# Patient Record
Sex: Female | Born: 1970 | Race: White | Hispanic: No | Smoking: Former smoker
Health system: Southern US, Community
[De-identification: ages and names within clinical notes are randomized; demographics above are authoritative.]

## PROBLEM LIST (undated history)

## (undated) DIAGNOSIS — E7800 Pure hypercholesterolemia, unspecified: Secondary | ICD-10-CM

## (undated) DIAGNOSIS — I10 Essential (primary) hypertension: Secondary | ICD-10-CM

## (undated) DIAGNOSIS — Z9289 Personal history of other medical treatment: Secondary | ICD-10-CM

## (undated) DIAGNOSIS — Z8659 Personal history of other mental and behavioral disorders: Secondary | ICD-10-CM

## (undated) DIAGNOSIS — G709 Myoneural disorder, unspecified: Secondary | ICD-10-CM

## (undated) DIAGNOSIS — I252 Old myocardial infarction: Secondary | ICD-10-CM

## (undated) DIAGNOSIS — L723 Sebaceous cyst: Secondary | ICD-10-CM

## (undated) DIAGNOSIS — I5032 Chronic diastolic (congestive) heart failure: Secondary | ICD-10-CM

## (undated) DIAGNOSIS — G43909 Migraine, unspecified, not intractable, without status migrainosus: Secondary | ICD-10-CM

## (undated) DIAGNOSIS — A4902 Methicillin resistant Staphylococcus aureus infection, unspecified site: Secondary | ICD-10-CM

## (undated) DIAGNOSIS — I2510 Atherosclerotic heart disease of native coronary artery without angina pectoris: Secondary | ICD-10-CM

## (undated) DIAGNOSIS — Z9889 Other specified postprocedural states: Secondary | ICD-10-CM

## (undated) DIAGNOSIS — I341 Nonrheumatic mitral (valve) prolapse: Secondary | ICD-10-CM

## (undated) DIAGNOSIS — G629 Polyneuropathy, unspecified: Secondary | ICD-10-CM

## (undated) HISTORY — DX: Essential (primary) hypertension: I10

## (undated) HISTORY — PX: SKIN GRAFT: SHX250

## (undated) HISTORY — DX: Nonrheumatic mitral (valve) prolapse: I34.1

## (undated) HISTORY — DX: Personal history of other medical treatment: Z92.89

## (undated) HISTORY — DX: Personal history of other mental and behavioral disorders: Z86.59

## (undated) HISTORY — DX: Migraine, unspecified, not intractable, without status migrainosus: G43.909

## (undated) HISTORY — DX: Chronic diastolic (congestive) heart failure: I50.32

## (undated) HISTORY — PX: ABDOMINAL HYSTERECTOMY: SHX81

## (undated) HISTORY — DX: Myoneural disorder, unspecified: G70.9

## (undated) HISTORY — PX: CORONARY ARTERY BYPASS GRAFT: SHX141

## (undated) HISTORY — DX: Sebaceous cyst: L72.3

## (undated) HISTORY — DX: Polyneuropathy, unspecified: G62.9

## (undated) HISTORY — DX: Other specified postprocedural states: Z98.890

---

## 1999-08-20 ENCOUNTER — Encounter: Payer: Self-pay | Admitting: *Deleted

## 1999-08-20 ENCOUNTER — Emergency Department (HOSPITAL_COMMUNITY): Admission: EM | Admit: 1999-08-20 | Discharge: 1999-08-20 | Payer: Self-pay | Admitting: *Deleted

## 1999-11-22 ENCOUNTER — Other Ambulatory Visit: Admission: RE | Admit: 1999-11-22 | Discharge: 1999-11-22 | Payer: Self-pay | Admitting: Obstetrics and Gynecology

## 2000-06-04 ENCOUNTER — Inpatient Hospital Stay (HOSPITAL_COMMUNITY): Admission: AD | Admit: 2000-06-04 | Discharge: 2000-06-04 | Payer: Self-pay | Admitting: Obstetrics and Gynecology

## 2000-07-05 ENCOUNTER — Inpatient Hospital Stay (HOSPITAL_COMMUNITY): Admission: AD | Admit: 2000-07-05 | Discharge: 2000-07-07 | Payer: Self-pay | Admitting: Obstetrics and Gynecology

## 2002-05-23 ENCOUNTER — Other Ambulatory Visit: Admission: RE | Admit: 2002-05-23 | Discharge: 2002-05-23 | Payer: Self-pay | Admitting: Family Medicine

## 2004-06-03 ENCOUNTER — Ambulatory Visit: Payer: Self-pay | Admitting: Family Medicine

## 2004-07-11 ENCOUNTER — Ambulatory Visit: Payer: Self-pay | Admitting: Family Medicine

## 2004-07-28 ENCOUNTER — Ambulatory Visit: Payer: Self-pay | Admitting: Family Medicine

## 2004-08-01 HISTORY — PX: ABDOMINAL HYSTERECTOMY: SHX81

## 2004-08-02 ENCOUNTER — Ambulatory Visit: Payer: Self-pay | Admitting: Family Medicine

## 2004-08-04 ENCOUNTER — Other Ambulatory Visit: Admission: RE | Admit: 2004-08-04 | Discharge: 2004-08-04 | Payer: Self-pay | Admitting: Obstetrics and Gynecology

## 2005-05-08 ENCOUNTER — Ambulatory Visit: Payer: Self-pay | Admitting: Family Medicine

## 2006-02-12 ENCOUNTER — Ambulatory Visit: Payer: Self-pay | Admitting: Family Medicine

## 2006-02-26 ENCOUNTER — Ambulatory Visit: Payer: Self-pay | Admitting: Family Medicine

## 2008-04-10 ENCOUNTER — Other Ambulatory Visit: Admission: RE | Admit: 2008-04-10 | Discharge: 2008-04-10 | Payer: Self-pay | Admitting: Internal Medicine

## 2008-04-10 ENCOUNTER — Encounter (INDEPENDENT_AMBULATORY_CARE_PROVIDER_SITE_OTHER): Payer: Self-pay | Admitting: Nurse Practitioner

## 2008-04-10 ENCOUNTER — Ambulatory Visit: Payer: Self-pay | Admitting: Nurse Practitioner

## 2008-04-10 DIAGNOSIS — N6019 Diffuse cystic mastopathy of unspecified breast: Secondary | ICD-10-CM | POA: Insufficient documentation

## 2008-04-10 DIAGNOSIS — E118 Type 2 diabetes mellitus with unspecified complications: Secondary | ICD-10-CM | POA: Insufficient documentation

## 2008-04-10 DIAGNOSIS — F17200 Nicotine dependence, unspecified, uncomplicated: Secondary | ICD-10-CM | POA: Insufficient documentation

## 2008-04-10 LAB — CONVERTED CEMR LAB

## 2008-04-13 ENCOUNTER — Encounter (INDEPENDENT_AMBULATORY_CARE_PROVIDER_SITE_OTHER): Payer: Self-pay | Admitting: Nurse Practitioner

## 2008-04-13 ENCOUNTER — Telehealth (INDEPENDENT_AMBULATORY_CARE_PROVIDER_SITE_OTHER): Payer: Self-pay | Admitting: Nurse Practitioner

## 2008-04-16 ENCOUNTER — Telehealth (INDEPENDENT_AMBULATORY_CARE_PROVIDER_SITE_OTHER): Payer: Self-pay | Admitting: Nurse Practitioner

## 2008-04-16 ENCOUNTER — Ambulatory Visit (HOSPITAL_COMMUNITY): Admission: RE | Admit: 2008-04-16 | Discharge: 2008-04-16 | Payer: Self-pay | Admitting: Internal Medicine

## 2008-04-21 ENCOUNTER — Encounter (INDEPENDENT_AMBULATORY_CARE_PROVIDER_SITE_OTHER): Payer: Self-pay | Admitting: Nurse Practitioner

## 2008-04-21 ENCOUNTER — Ambulatory Visit: Payer: Self-pay | Admitting: Internal Medicine

## 2008-05-20 ENCOUNTER — Ambulatory Visit: Payer: Self-pay | Admitting: Nurse Practitioner

## 2008-05-20 LAB — CONVERTED CEMR LAB
Bilirubin Urine: NEGATIVE
Blood Glucose, Fingerstick: 204
Blood in Urine, dipstick: NEGATIVE
Cholesterol, target level: 200 mg/dL
Glucose, Urine, Semiquant: 100
HDL goal, serum: 40 mg/dL
Ketones, urine, test strip: NEGATIVE
LDL Goal: 100 mg/dL
Nitrite: NEGATIVE
Protein, U semiquant: NEGATIVE
Rubella: 27.6 intl units/mL — ABNORMAL HIGH
Rubeola IgG: 1.35 — ABNORMAL HIGH
Specific Gravity, Urine: 1.015
Urobilinogen, UA: 0.2
Varicella IgG: 5.29 — ABNORMAL HIGH
WBC Urine, dipstick: NEGATIVE
pH: 5.5

## 2008-05-22 ENCOUNTER — Ambulatory Visit: Payer: Self-pay | Admitting: Nurse Practitioner

## 2008-10-09 ENCOUNTER — Ambulatory Visit: Payer: Self-pay | Admitting: Nurse Practitioner

## 2008-10-09 DIAGNOSIS — L723 Sebaceous cyst: Secondary | ICD-10-CM | POA: Insufficient documentation

## 2009-03-31 ENCOUNTER — Ambulatory Visit: Payer: Self-pay | Admitting: Cardiovascular Disease

## 2009-04-01 ENCOUNTER — Ambulatory Visit: Payer: Self-pay | Admitting: Thoracic Surgery (Cardiothoracic Vascular Surgery)

## 2009-04-01 ENCOUNTER — Encounter: Payer: Self-pay | Admitting: Family Medicine

## 2009-04-01 ENCOUNTER — Ambulatory Visit: Payer: Self-pay | Admitting: Cardiovascular Disease

## 2009-04-01 ENCOUNTER — Encounter: Payer: Self-pay | Admitting: Emergency Medicine

## 2009-04-01 ENCOUNTER — Inpatient Hospital Stay (HOSPITAL_COMMUNITY): Admission: EM | Admit: 2009-04-01 | Discharge: 2009-04-15 | Payer: Self-pay | Admitting: Cardiology

## 2009-04-01 ENCOUNTER — Ambulatory Visit: Payer: Self-pay | Admitting: Internal Medicine

## 2009-04-02 ENCOUNTER — Encounter: Payer: Self-pay | Admitting: Cardiology

## 2009-04-03 ENCOUNTER — Encounter: Payer: Self-pay | Admitting: Cardiology

## 2009-04-03 ENCOUNTER — Ambulatory Visit: Payer: Self-pay | Admitting: Surgery

## 2009-04-06 ENCOUNTER — Ambulatory Visit: Payer: Self-pay | Admitting: Thoracic Surgery (Cardiothoracic Vascular Surgery)

## 2009-04-07 ENCOUNTER — Encounter: Payer: Self-pay | Admitting: Thoracic Surgery (Cardiothoracic Vascular Surgery)

## 2009-04-08 ENCOUNTER — Ambulatory Visit: Payer: Self-pay | Admitting: Pulmonary Disease

## 2009-04-14 ENCOUNTER — Telehealth: Payer: Self-pay | Admitting: Family Medicine

## 2009-04-19 ENCOUNTER — Encounter: Payer: Self-pay | Admitting: Cardiology

## 2009-04-19 ENCOUNTER — Ambulatory Visit: Payer: Self-pay | Admitting: Cardiology

## 2009-04-19 LAB — CONVERTED CEMR LAB: POC INR: 1.8

## 2009-04-26 ENCOUNTER — Ambulatory Visit: Payer: Self-pay | Admitting: Family Medicine

## 2009-04-26 ENCOUNTER — Ambulatory Visit: Payer: Self-pay | Admitting: Cardiology

## 2009-04-26 DIAGNOSIS — I10 Essential (primary) hypertension: Secondary | ICD-10-CM | POA: Insufficient documentation

## 2009-04-26 DIAGNOSIS — I2510 Atherosclerotic heart disease of native coronary artery without angina pectoris: Secondary | ICD-10-CM | POA: Insufficient documentation

## 2009-04-26 DIAGNOSIS — E785 Hyperlipidemia, unspecified: Secondary | ICD-10-CM | POA: Insufficient documentation

## 2009-04-28 DIAGNOSIS — I080 Rheumatic disorders of both mitral and aortic valves: Secondary | ICD-10-CM | POA: Insufficient documentation

## 2009-04-30 LAB — CONVERTED CEMR LAB
BUN: 9 mg/dL (ref 6–23)
Basophils Absolute: 0.1 10*3/uL (ref 0.0–0.1)
Basophils Relative: 1.2 % (ref 0.0–3.0)
CO2: 27 meq/L (ref 19–32)
Calcium: 9.2 mg/dL (ref 8.4–10.5)
Chloride: 99 meq/L (ref 96–112)
Creatinine, Ser: 0.7 mg/dL (ref 0.4–1.2)
Creatinine,U: 64.6 mg/dL
Eosinophils Absolute: 0.2 10*3/uL (ref 0.0–0.7)
Eosinophils Relative: 2.7 % (ref 0.0–5.0)
GFR calc non Af Amer: 98.34 mL/min (ref >60)
Glucose, Bld: 104 mg/dL — ABNORMAL HIGH (ref 70–99)
HCT: 34.1 % — ABNORMAL LOW (ref 36.0–46.0)
Hemoglobin: 11.5 g/dL — ABNORMAL LOW (ref 12.0–15.0)
Hgb A1c MFr Bld: 8 % — ABNORMAL HIGH (ref 4.6–6.5)
Lymphocytes Relative: 28.3 % (ref 12.0–46.0)
Lymphs Abs: 2.5 10*3/uL (ref 0.7–4.0)
MCHC: 33.6 g/dL (ref 30.0–36.0)
MCV: 91.1 fL (ref 78.0–100.0)
Microalb Creat Ratio: 10.8 mg/g (ref 0.0–30.0)
Microalb, Ur: 0.7 mg/dL (ref 0.0–1.9)
Monocytes Absolute: 0.6 10*3/uL (ref 0.1–1.0)
Monocytes Relative: 6.4 % (ref 3.0–12.0)
Neutro Abs: 5.3 10*3/uL (ref 1.4–7.7)
Neutrophils Relative %: 61.4 % (ref 43.0–77.0)
Platelets: 452 10*3/uL — ABNORMAL HIGH (ref 150.0–400.0)
Potassium: 3.7 meq/L (ref 3.5–5.1)
RBC: 3.74 M/uL — ABNORMAL LOW (ref 3.87–5.11)
RDW: 12.9 % (ref 11.5–14.6)
Sodium: 140 meq/L (ref 135–145)
WBC: 8.7 10*3/uL (ref 4.5–10.5)

## 2009-05-03 ENCOUNTER — Ambulatory Visit: Payer: Self-pay | Admitting: Thoracic Surgery (Cardiothoracic Vascular Surgery)

## 2009-05-03 ENCOUNTER — Encounter: Payer: Self-pay | Admitting: Cardiology

## 2009-05-03 ENCOUNTER — Encounter
Admission: RE | Admit: 2009-05-03 | Discharge: 2009-05-03 | Payer: Self-pay | Admitting: Thoracic Surgery (Cardiothoracic Vascular Surgery)

## 2009-05-03 ENCOUNTER — Encounter: Payer: Self-pay | Admitting: Family Medicine

## 2009-05-04 ENCOUNTER — Ambulatory Visit: Payer: Self-pay | Admitting: Thoracic Surgery (Cardiothoracic Vascular Surgery)

## 2009-05-04 ENCOUNTER — Ambulatory Visit: Payer: Self-pay | Admitting: Internal Medicine

## 2009-05-04 LAB — CONVERTED CEMR LAB: POC INR: 1.6

## 2009-05-10 ENCOUNTER — Encounter: Payer: Self-pay | Admitting: Family Medicine

## 2009-05-10 ENCOUNTER — Ambulatory Visit: Payer: Self-pay | Admitting: Thoracic Surgery (Cardiothoracic Vascular Surgery)

## 2009-05-11 ENCOUNTER — Ambulatory Visit: Payer: Self-pay | Admitting: Cardiology

## 2009-05-11 DIAGNOSIS — I25810 Atherosclerosis of coronary artery bypass graft(s) without angina pectoris: Secondary | ICD-10-CM | POA: Insufficient documentation

## 2009-05-11 LAB — CONVERTED CEMR LAB: POC INR: 1.7

## 2009-05-17 ENCOUNTER — Ambulatory Visit: Payer: Self-pay | Admitting: Thoracic Surgery (Cardiothoracic Vascular Surgery)

## 2009-05-17 ENCOUNTER — Encounter: Payer: Self-pay | Admitting: Cardiology

## 2009-05-21 ENCOUNTER — Encounter (INDEPENDENT_AMBULATORY_CARE_PROVIDER_SITE_OTHER): Payer: Self-pay | Admitting: *Deleted

## 2009-05-24 ENCOUNTER — Ambulatory Visit: Payer: Self-pay | Admitting: Thoracic Surgery (Cardiothoracic Vascular Surgery)

## 2009-05-26 ENCOUNTER — Ambulatory Visit (HOSPITAL_COMMUNITY)
Admission: RE | Admit: 2009-05-26 | Discharge: 2009-05-26 | Payer: Self-pay | Admitting: Thoracic Surgery (Cardiothoracic Vascular Surgery)

## 2009-05-27 ENCOUNTER — Ambulatory Visit: Payer: Self-pay | Admitting: Family Medicine

## 2009-05-31 ENCOUNTER — Ambulatory Visit: Payer: Self-pay | Admitting: Thoracic Surgery (Cardiothoracic Vascular Surgery)

## 2009-05-31 ENCOUNTER — Ambulatory Visit (HOSPITAL_COMMUNITY)
Admission: RE | Admit: 2009-05-31 | Discharge: 2009-05-31 | Payer: Self-pay | Admitting: Thoracic Surgery (Cardiothoracic Vascular Surgery)

## 2009-06-04 ENCOUNTER — Encounter (INDEPENDENT_AMBULATORY_CARE_PROVIDER_SITE_OTHER): Payer: Self-pay | Admitting: *Deleted

## 2009-06-08 ENCOUNTER — Ambulatory Visit: Payer: Self-pay | Admitting: Thoracic Surgery (Cardiothoracic Vascular Surgery)

## 2009-06-14 ENCOUNTER — Encounter: Payer: Self-pay | Admitting: Cardiology

## 2009-06-21 ENCOUNTER — Ambulatory Visit: Payer: Self-pay | Admitting: Thoracic Surgery (Cardiothoracic Vascular Surgery)

## 2009-06-24 ENCOUNTER — Telehealth: Payer: Self-pay | Admitting: Family Medicine

## 2009-06-28 ENCOUNTER — Ambulatory Visit: Payer: Self-pay | Admitting: Thoracic Surgery (Cardiothoracic Vascular Surgery)

## 2009-07-05 ENCOUNTER — Ambulatory Visit: Payer: Self-pay | Admitting: Thoracic Surgery (Cardiothoracic Vascular Surgery)

## 2009-07-05 ENCOUNTER — Ambulatory Visit (HOSPITAL_COMMUNITY)
Admission: RE | Admit: 2009-07-05 | Discharge: 2009-07-05 | Payer: Self-pay | Admitting: Thoracic Surgery (Cardiothoracic Vascular Surgery)

## 2009-07-12 ENCOUNTER — Ambulatory Visit: Payer: Self-pay | Admitting: Thoracic Surgery (Cardiothoracic Vascular Surgery)

## 2009-07-14 ENCOUNTER — Ambulatory Visit: Payer: Self-pay | Admitting: Thoracic Surgery (Cardiothoracic Vascular Surgery)

## 2009-07-14 ENCOUNTER — Inpatient Hospital Stay (HOSPITAL_COMMUNITY)
Admission: AD | Admit: 2009-07-14 | Discharge: 2009-08-06 | Payer: Self-pay | Admitting: Thoracic Surgery (Cardiothoracic Vascular Surgery)

## 2009-07-15 ENCOUNTER — Ambulatory Visit: Payer: Self-pay | Admitting: Thoracic Surgery (Cardiothoracic Vascular Surgery)

## 2009-08-02 ENCOUNTER — Encounter: Payer: Self-pay | Admitting: Cardiology

## 2009-08-19 ENCOUNTER — Encounter: Payer: Self-pay | Admitting: Cardiology

## 2009-08-23 ENCOUNTER — Ambulatory Visit: Payer: Self-pay | Admitting: Thoracic Surgery (Cardiothoracic Vascular Surgery)

## 2009-09-02 ENCOUNTER — Ambulatory Visit: Payer: Self-pay | Admitting: Nurse Practitioner

## 2009-09-02 DIAGNOSIS — G609 Hereditary and idiopathic neuropathy, unspecified: Secondary | ICD-10-CM | POA: Insufficient documentation

## 2009-09-02 LAB — CONVERTED CEMR LAB
Bilirubin Urine: NEGATIVE
Blood Glucose, Fingerstick: 148
Blood in Urine, dipstick: NEGATIVE
Glucose, Urine, Semiquant: NEGATIVE
Hgb A1c MFr Bld: 6.5 %
Ketones, urine, test strip: NEGATIVE
Nitrite: NEGATIVE
Specific Gravity, Urine: >=1.03
Urobilinogen, UA: 0.2
WBC Urine, dipstick: NEGATIVE
pH: 5

## 2009-09-03 LAB — CONVERTED CEMR LAB: Microalb, Ur: 2.92 mg/dL — ABNORMAL HIGH (ref 0.00–1.89)

## 2009-09-07 ENCOUNTER — Encounter (INDEPENDENT_AMBULATORY_CARE_PROVIDER_SITE_OTHER): Payer: Self-pay | Admitting: Nurse Practitioner

## 2009-09-09 ENCOUNTER — Telehealth (INDEPENDENT_AMBULATORY_CARE_PROVIDER_SITE_OTHER): Payer: Self-pay | Admitting: Nurse Practitioner

## 2009-09-09 ENCOUNTER — Encounter (INDEPENDENT_AMBULATORY_CARE_PROVIDER_SITE_OTHER): Payer: Self-pay | Admitting: Nurse Practitioner

## 2009-09-10 ENCOUNTER — Encounter: Payer: Self-pay | Admitting: Cardiology

## 2009-09-10 ENCOUNTER — Ambulatory Visit: Payer: Self-pay | Admitting: Thoracic Surgery (Cardiothoracic Vascular Surgery)

## 2009-10-05 ENCOUNTER — Encounter (INDEPENDENT_AMBULATORY_CARE_PROVIDER_SITE_OTHER): Payer: Self-pay | Admitting: Nurse Practitioner

## 2009-10-15 ENCOUNTER — Ambulatory Visit: Payer: Self-pay | Admitting: Cardiology

## 2009-10-15 DIAGNOSIS — Z87891 Personal history of nicotine dependence: Secondary | ICD-10-CM | POA: Insufficient documentation

## 2009-10-19 ENCOUNTER — Encounter (INDEPENDENT_AMBULATORY_CARE_PROVIDER_SITE_OTHER): Payer: Self-pay | Admitting: Nurse Practitioner

## 2009-10-21 ENCOUNTER — Emergency Department (HOSPITAL_COMMUNITY): Admission: EM | Admit: 2009-10-21 | Discharge: 2009-10-21 | Payer: Self-pay | Admitting: Family Medicine

## 2010-02-12 ENCOUNTER — Emergency Department (HOSPITAL_BASED_OUTPATIENT_CLINIC_OR_DEPARTMENT_OTHER): Admission: EM | Admit: 2010-02-12 | Discharge: 2010-02-12 | Payer: Self-pay | Admitting: Emergency Medicine

## 2010-02-18 ENCOUNTER — Telehealth (INDEPENDENT_AMBULATORY_CARE_PROVIDER_SITE_OTHER): Payer: Self-pay | Admitting: Nurse Practitioner

## 2010-03-14 ENCOUNTER — Ambulatory Visit: Payer: Self-pay | Admitting: Thoracic Surgery (Cardiothoracic Vascular Surgery)

## 2010-03-14 ENCOUNTER — Encounter: Payer: Self-pay | Admitting: Cardiology

## 2010-04-24 ENCOUNTER — Encounter: Payer: Self-pay | Admitting: Thoracic Surgery (Cardiothoracic Vascular Surgery)

## 2010-05-01 LAB — CONVERTED CEMR LAB
ALT: 20 units/L (ref 0–35)
AST: 14 units/L (ref 0–37)
Albumin: 4.3 g/dL (ref 3.5–5.2)
Alkaline Phosphatase: 97 units/L (ref 39–117)
BUN: 13 mg/dL (ref 6–23)
Basophils Absolute: 0.1 10*3/uL (ref 0.0–0.1)
Basophils Relative: 0 % (ref 0–1)
Bilirubin Urine: NEGATIVE
Blood Glucose, Fingerstick: 226
Blood in Urine, dipstick: NEGATIVE
CO2: 24 meq/L (ref 19–32)
Calcium: 9.5 mg/dL (ref 8.4–10.5)
Chlamydia, DNA Probe: NEGATIVE
Chloride: 99 meq/L (ref 96–112)
Cholesterol: 258 mg/dL — ABNORMAL HIGH (ref 0–200)
Creatinine, Ser: 0.83 mg/dL (ref 0.40–1.20)
Eosinophils Absolute: 0.1 10*3/uL (ref 0.0–0.7)
Eosinophils Relative: 1 % (ref 0–5)
GC Probe Amp, Genital: NEGATIVE
Glucose, Bld: 251 mg/dL — ABNORMAL HIGH (ref 70–99)
Glucose, Urine, Semiquant: >=1000
HCT: 45.7 % (ref 36.0–46.0)
HDL: 31 mg/dL — ABNORMAL LOW (ref >39)
Hemoglobin: 15.6 g/dL — ABNORMAL HIGH (ref 12.0–15.0)
Hgb A1c MFr Bld: 10.5 %
LDL Cholesterol: 172 mg/dL — ABNORMAL HIGH (ref 0–99)
Lymphocytes Relative: 25 % (ref 12–46)
Lymphs Abs: 3.3 10*3/uL (ref 0.7–4.0)
MCHC: 34.1 g/dL (ref 30.0–36.0)
MCV: 91.8 fL (ref 78.0–100.0)
Microalb, Ur: 4.02 mg/dL — ABNORMAL HIGH (ref 0.00–1.89)
Monocytes Absolute: 0.6 10*3/uL (ref 0.1–1.0)
Monocytes Relative: 4 % (ref 3–12)
Neutro Abs: 9.5 10*3/uL — ABNORMAL HIGH (ref 1.7–7.7)
Neutrophils Relative %: 70 % (ref 43–77)
Nitrite: NEGATIVE
Platelets: 297 10*3/uL (ref 150–400)
Potassium: 4.8 meq/L (ref 3.5–5.3)
Protein, U semiquant: 30
RBC: 4.98 M/uL (ref 3.87–5.11)
RDW: 12.9 % (ref 11.5–15.5)
Sodium: 137 meq/L (ref 135–145)
Specific Gravity, Urine: >=1.03
TSH: 1.109 microintl units/mL (ref 0.350–4.50)
Total Bilirubin: 0.7 mg/dL (ref 0.3–1.2)
Total CHOL/HDL Ratio: 8.3
Total Protein: 7.8 g/dL (ref 6.0–8.3)
Triglycerides: 273 mg/dL — ABNORMAL HIGH (ref <150)
Urobilinogen, UA: 0.2
VLDL: 55 mg/dL — ABNORMAL HIGH (ref 0–40)
WBC Urine, dipstick: NEGATIVE
WBC: 13.5 10*3/uL — ABNORMAL HIGH (ref 4.0–10.5)
pH: 5.5

## 2010-05-03 NOTE — Letter (Signed)
Summary: [facility] NOTES  /OPHTHALMOLOGIST NOTES   Imported By: Arta Bruce 10/20/2009 14:51:01  _____________________________________________________________________  External Attachment:    Type:   Image     Comment:   External Document

## 2010-05-03 NOTE — Miscellaneous (Signed)
Summary: Prior approval  Clinical Lists Changes Prior approval for one year 09/07/09 - 09/07/10  Medications: Changed medication from CRESTOR 40 MG TABS (ROSUVASTATIN CALCIUM) One tablet by mouth nightly to CRESTOR 40 MG TABS (ROSUVASTATIN CALCIUM) One tablet by mouth nightly

## 2010-05-03 NOTE — Letter (Signed)
Summary: Appointment - Missed  [facility], Main Office  [address]   [place], [place] [address]   Phone: [phone]  Fax: [phone]     June 04, 2009 MRN: [id number]   [facility] [address] [place], [place]  [address]   Dear [name],  Our records indicate you missed your appointment on 05/31/2009 with Dr. Riley Kill. It is very important that we reach you to reschedule this appointment. We look forward to participating in your health care needs. Please contact us at the number listed above at your earliest convenience to reschedule this appointment.     Sincerely, LG Glass blower/designer

## 2010-05-03 NOTE — Letter (Signed)
Summary: [facility]  [facility]   Imported By: Maryln Gottron 05/25/2009 12:18:37  _____________________________________________________________________  External Attachment:    Type:   Image     Comment:   External Document

## 2010-05-03 NOTE — Medication Information (Signed)
Summary: ROV/LB  Anticoagulant Therapy  Managed by: Shelby Dubin, PharmD, BCPS, CPP Referring MD: Shawnie Pons, MD PCP: Dr. Shellia Carwin Supervising MD: Antoine Poche MD, Fayrene Fearing Indication 1: Mitral Valve Regurgitation INR RANGE 2-3  Dietary changes: no    Health status changes: no    Bleeding/hemorrhagic complications: no    Recent/future hospitalizations: no    Any changes in medication regimen? no    Recent/future dental: no  Any missed doses?: no       Is patient compliant with meds? yes       Allergies (verified): 1)  ! Metformin Hcl  Anticoagulation Management History:      The patient is taking warfarin and comes in today for a routine follow up visit.  Positive risk factors for bleeding include presence of serious comorbidities.  Negative risk factors for bleeding include an age less than 25 years old and no history of CVA/TIA.  The bleeding index is 'intermediate risk'.  Positive CHADS2 values include History of HTN and History of Diabetes.  Negative CHADS2 values include Age > 63 years old and Prior Stroke/CVA/TIA.  Anticoagulation responsible provider: Antoine Poche MD, Fayrene Fearing.  Cuvette Lot#: [id number].  Exp: 07/2010.    Anticoagulation Management Assessment/Plan:      The patient's current anticoagulation dose is Warfarin sodium 2 mg tabs: Use as directed by Anticoagualtion Clinic.  The target INR is 2.0-3.0.  The next INR is due 05/04/2009.  Results were reviewed/authorized by Shelby Dubin, PharmD, BCPS, CPP.  She was notified by Ysidro Evert, Pharm D Candidate.         Prior Anticoagulation Instructions: INR 1.8  Take 1.5 tablets today then take 1 tablet daily except 1.5 tablets on Monday. Recheck in 1 week.  Current Anticoagulation Instructions: INR: 1.7 Take 2 tablets today, then increase dose to 1 tablet daily except 1.5 tablets on Mondays and Thursdays Recheck in 1 weeks

## 2010-05-03 NOTE — Progress Notes (Signed)
Summary: re-est pt  Phone Note Call from Patient Call back at [phone]   Caller: Other Relative-sister [facility] Call For: [facility] Summary of Call: Pt had open heart she will be release from hos 04-15-2009. pt has seen dr Shelley Cocke in  2007 due to medicaid pt found new md, pt now has [facility] ins per sister  dr Riley Kill is requesting pt see dr Kindall Swaby when in 3 wk, Can  I re-est her and create 30 min ov. Initial call taken by: Heron Sabins,  April 14, 2009 3:47 PM  Follow-up for Phone Call        yes I would be glad to see her again Follow-up by: Nelwyn Salisbury MD,  April 15, 2009 9:01 AM  Additional Follow-up for Phone Call Additional follow up Details #1::        ov sch 04-26-2009 1pm Additional Follow-up by: Heron Sabins,  April 15, 2009 9:42 AM

## 2010-05-03 NOTE — Assessment & Plan Note (Signed)
Summary: HTN   Vital Signs:  Patient profile:   40 year old female Weight:      202.5 pounds Temp:     98.1 degrees F oral Pulse rate:   84 / minute Pulse rhythm:   regular Resp:     20 per minute BP sitting:   140 / 88  (right arm) Cuff size:   large  Vitals Entered By: Mikey College CMA (October 09, 2008 2:22 PM) CC: pt states she has been having some recent chest pain and last episode was yesterday. they only last for 2-3 minutes then stop. pt also states having a possible boil or pimple that is irritated on abd area. It's now red and her clothes are irritating it. Also has small pimple on lt back side of thigh that she would like provider to look at., Hypertension Management Pain Assessment Patient in pain? no       Does patient need assistance? Functional Status Self care Ambulation Normal   CC:  pt states she has been having some recent chest pain and last episode was yesterday. they only last for 2-3 minutes then stop. pt also states having a possible boil or pimple that is irritated on abd area. It's now red and her clothes are irritating it. Also has small pimple on lt back side of thigh that she would like provider to look at. and Hypertension Management.  History of Present Illness:  Pt into the office with c/o chest pain started about 2-3 weeks. describes as midsternal pain when it does occur she breaks out in a sweat then she has tingling in her arms.  last for a few minutes then resolves Always with activity.  She will stop what she is doing and then sit down No daily occurence Recent passing of grandmother and other stressors  Diabetes - without her medications for about 3 months    Hypertension History:      She denies headache, chest pain, and palpitations.  no current medications.        Positive major cardiovascular risk factors include diabetes, hyperlipidemia, hypertension, family history for ischemic heart disease (females less than 56 years old & males  less than 74 years old), and current tobacco user.  Negative major cardiovascular risk factors include female age less than 58 years old.        Further assessment for target organ damage reveals no history of ASHD, cardiac end-organ damage (CHF/LVH), stroke/TIA, peripheral vascular disease, renal insufficiency, or hypertensive retinopathy.     Habits & Providers  Alcohol-Tobacco-Diet     Alcohol drinks/day: 0     Tobacco Status: current     Tobacco Counseling: to quit use of tobacco products     Cigarette Packs/Day: 1     Year Started: 1987  Exercise-Depression-Behavior     Does Patient Exercise: no     Drug Use: no     Seat Belt Use: 100     Sun Exposure: occasionally  Allergies: No Known Drug Allergies  Review of Systems CV:  Complains of chest pain or discomfort. Resp:  Denies cough. GI:  Denies abdominal pain, nausea, and vomiting. Derm:  Complains of itching.  Physical Exam  General:  alert.   Head:  normocephalic.   Msk:  left abd 1.1cm x 1.1cm cystic lesion nontender but some irritation Skin:  left posterior thigh - .4mm x .4mm Psych:  Oriented X3.     Impression & Recommendations:  Problem # 1:  HYPERTENSION, BENIGN  ESSENTIAL (ICD-401.1) will start bp meds advised pt to check at home and report values Her updated medication list for this problem includes:    Lisinopril 5 Mg Tabs (Lisinopril) ..... One tablet by mouth for blood pressure  Problem # 2:  DIABETES MELLITUS, TYPE II, UNCONTROLLED (ICD-250.02) will check on next week Her updated medication list for this problem includes:    Metformin Hcl 500 Mg Tabs (Metformin hcl) .Marland Kitchen... 1 tablet by mouth two times a day for blood sugar    Lisinopril 5 Mg Tabs (Lisinopril) ..... One tablet by mouth for blood pressure  Problem # 3:  TOBACCO ABUSE (ICD-305.1) advised cessation  Problem # 4:  SEBACEOUS CYST (ICD-706.2) will monitor  Complete Medication List: 1)  Metformin Hcl 500 Mg Tabs (Metformin hcl) .Marland Kitchen..  1 tablet by mouth two times a day for blood sugar 2)  Prodigy Blood Glucose Monitor W/device Kit (Blood glucose monitoring suppl) .... Dispense one meter  dx 250.02 3)  Prodigy Blood Glucose Test Strp (Glucose blood) .... To check blood sugar twice daily  dx 250.02 4)  Prodigy Lancing Device Misc (Lancet devices) .... Check blood sugar twice daily dx:250.02 5)  Pravachol 20 Mg Tabs (Pravastatin sodium) .Marland Kitchen.. 1 tablet by mouth nightly for cholesterol 6)  Lisinopril 5 Mg Tabs (Lisinopril) .... One tablet by mouth for blood pressure  Hypertension Assessment/Plan:      The patient's hypertensive risk group is category C: Target organ damage and/or diabetes.  Her calculated 10 year risk of coronary heart disease is 15 %.  Today's blood pressure is 140/88.  Her blood pressure goal is < 130/80.  Patient Instructions: 1)  You need to monitor the areas on your belly and thigh. 2)  start blood pressure medication 3)  keep track of blood pressure 4)  Follow up in 2 weeks for blood pressure check. 5)  Bring log back with you. 6)  Will also need Hgba1c and glucoscan. Prescriptions: LISINOPRIL 5 MG TABS (LISINOPRIL) One tablet by mouth for blood pressure  #30 x 3   Entered and Authorized by:   Lehman Prom FNP   Signed by:   Lehman Prom FNP on 10/09/2008   Method used:   Print then Give to Patient   RxID:   [id number] LISINOPRIL 5 MG TABS (LISINOPRIL) One tablet by mouth for blood pressure  #30 x 3   Entered and Authorized by:   Lehman Prom FNP   Signed by:   Lehman Prom FNP on 10/09/2008   Method used:   Print then Give to Patient   RxID:   [id number]

## 2010-05-03 NOTE — Letter (Signed)
Summary: [facility]   [facility]   Imported By: Roderic Ovens 06/10/2009 15:05:02  _____________________________________________________________________  External Attachment:    Type:   Image     Comment:   External Document  Appended Document: [facility]  reviewed.  TS

## 2010-05-03 NOTE — Assessment & Plan Note (Signed)
Summary: Diabetes/Hypercholesterolemia   Vital Signs:  Patient Profile:   40 Years Old Female Height:     64 inches Weight:      202 pounds BMI:     34.80 BSA:     1.97 Temp:     97.3 degrees F oral Pulse rate:   80 / minute Pulse rhythm:   regular Resp:     18 per minute BP sitting:   128 / 80  (left arm) Cuff size:   regular  Pt. in pain?   no  Vitals Entered By: [place] Shannon (May 20, 2008 11:57 AM)              Is Patient Diabetic? Yes Did you bring your meter with you today? No CBG Result 204     Chief Complaint:  F/U   pt says she has a knot on her right foot...Marland KitchenMarland KitchenMarland Kitchen she don't know where i came from...Marland KitchenMarland Kitchen pt says after she takes her metformin in the mornings she feels sharp pains in her chest and  both of her arms starts tingling for a week or two now.......  History of Present Illness:  Pt into the office with f/u diabetes.   Diabetes Management History:      The patient is a 40 years old female who comes in for evaluation of DM Type 2.  She states understanding of dietary principles and is following her diet appropriately.  No sensory loss is reported.  Self foot exams are not being performed.  She is checking home blood sugars.  She says that she is not exercising regularly.        Other comments include: Pt did go to see susie piper for diaetes education which she found helpful.        The following changes have been made to her treatment plan since last visit: medication changes.  Treatment plan changes were initiated by MD.    Lipid Management History:      Positive NCEP/ATP III risk factors include diabetes, HDL cholesterol less than 40, family history for ischemic heart disease (females less than 70 years old & males less than 2 years old), and current tobacco user.  Negative NCEP/ATP III risk factors include female age less than 70 years old, no history of early menopause without estrogen hormone replacement, non-hypertensive, no ASHD (atherosclerotic  heart disease), no prior stroke/TIA, no peripheral vascular disease, and no history of aortic aneurysm.        The patient states that she knows about the "Therapeutic Lifestyle Change" diet.  Her compliance with the TLC diet is fair.  The patient expresses understanding of adjunctive measures for cholesterol lowering.  She expresses no side effects from her lipid-lowering medication.  The patient denies any symptoms to suggest myopathy or liver disease from her "statin" therapy.        Prior Medications Reviewed Using: Patient Recall  Current Allergies (reviewed today): No known allergies      Review of Systems  General      Denies fever.  CV      Denies chest pain or discomfort.  Resp      Denies cough.  GI      Denies abdominal pain, nausea, and vomiting.  Neuro      Complains of tingling.      intermittently in bil arms   Physical Exam  General:     alert.   Head:     normocephalic.   Lungs:     normal  breath sounds.   Heart:     normal rate and regular rhythm.   Abdomen:     soft, non-tender, and normal bowel sounds.   Msk:     no limits Neurologic:     alert & oriented X3.   Psych:     Oriented X3.      Impression & Recommendations:  Problem # 1:  DIABETES MELLITUS, TYPE II, UNCONTROLLED (ICD-250.02) pt has been to diabetes educator as ordered will order retasure  pt is checking blood sugar daily continue current meds will schedule for diabetes session Her updated medication list for this problem includes:    Metformin Hcl 500 Mg Tabs (Metformin hcl) .Marland Kitchen... 1 tablet by mouth two times a day for blood sugar   Problem # 2:  HYPERCHOLESTEROLEMIA (ICD-272.0) pt has started on meds and has changed her diet Her updated medication list for this problem includes:    Pravachol 20 Mg Tabs (Pravastatin sodium) .Marland Kitchen... 1 tablet by mouth nightly for cholesterol   Problem # 3:  ELEVATED BLOOD PRESSURE WITHOUT DIAGNOSIS OF HYPERTENSION (ICD-796.2) pt  checks her blood pressure at home daily  Problem # 4:  HEALTH SCREENING (ICD-V70.0) pt has a form for school no vaccination record available will check immunity status Orders: T-Rubella Antibody ([id number]) T-Mumps Virus Antibody, IgM ([id number]) T-Measles (Rubeola) Antibody IgG ([id number]) T-Varicella-Zoster Antibody ([id number]) Hepatitis B Surface Antibody ([id number]) T-Hepatitis B Surface Antibody ([id number])   Complete Medication List: 1)  Metformin Hcl 500 Mg Tabs (Metformin hcl) .Marland Kitchen.. 1 tablet by mouth two times a day for blood sugar 2)  Prodigy Blood Glucose Monitor W/device Kit (Blood glucose monitoring suppl) .... Dispense one meter  dx 250.02 3)  Prodigy Blood Glucose Test Strp (Glucose blood) .... To check blood sugar twice daily  dx 250.02 4)  Prodigy Lancing Device Misc (Lancet devices) .... Check blood sugar twice daily dx:250.02 5)  Pravachol 20 Mg Tabs (Pravastatin sodium) .Marland Kitchen.. 1 tablet by mouth nightly for cholesterol  Diabetes Management Assessment/Plan:      The following lipid goals have been established for the patient: Total cholesterol goal of 200; LDL cholesterol goal of 100; HDL cholesterol goal of 40; Triglyceride goal of 150.  Her blood pressure goal is < 130/80.    Lipid Assessment/Plan:      Based on NCEP/ATP III, the patient's risk factor category is "history of diabetes".  From this information, the patient's calculated lipid goals are as follows: Total cholesterol goal is 200; LDL cholesterol goal is 100; HDL cholesterol goal is 40; Triglyceride goal is 150.        Her BMI is calculated to be 34.80.  The patient has triglyceride level over 150 and an HDL less than 51 (female), but she does not meet the criteria for dysmetabolic syndrome.     Patient Instructions: 1)  Lab visit Friday at 2:30 for ppd reading.  2)  Schedule an appointment on March 9th at 10:30 for a diabetes session. 3)  Will need pneumovax. 4)  Schedulle an appointment for  retasure testing at [address].   Prescriptions: METFORMIN HCL 500 MG TABS (METFORMIN HCL) 1 tablet by mouth two times a day for blood sugar  #60 x 3   Entered and Authorized by:   Lehman Prom FNP   Signed by:   Lehman Prom FNP on 05/20/2008   Method used:   Electronically to        [facility]  [address] [phone]* (retail)  [address]       [place], [place]  [address]       Ph: [phone] or [phone]       Fax: [phone]   RxID:   [id number]   Laboratory Results   Urine Tests  Date/Time Received: May 20, 2008 1:22 PM   Routine Urinalysis   Color: lt. yellow Glucose: 100   (Normal Range: Negative) Bilirubin: negative   (Normal Range: Negative) Ketone: negative   (Normal Range: Negative) Spec. Gravity: 1.015   (Normal Range: 1.003-1.035) Blood: negative   (Normal Range: Negative) pH: 5.5   (Normal Range: 5.0-8.0) Protein: negative   (Normal Range: Negative) Urobilinogen: 0.2   (Normal Range: 0-1) Nitrite: negative   (Normal Range: Negative) Leukocyte Esterace: negative   (Normal Range: Negative)     Blood Tests   Date/Time Received: May 20, 2008 12:00 PM  Date/Time Reported: May 20, 2008 12:00 PM   CBG Random:: 204mg /dL       Laboratory Results   Urine Tests    Routine Urinalysis   Color: lt. yellow Glucose: 100   (Normal Range: Negative) Bilirubin: negative   (Normal Range: Negative) Ketone: negative   (Normal Range: Negative) Spec. Gravity: 1.015   (Normal Range: 1.003-1.035) Blood: negative   (Normal Range: Negative) pH: 5.5   (Normal Range: 5.0-8.0) Protein: negative   (Normal Range: Negative) Urobilinogen: 0.2   (Normal Range: 0-1) Nitrite: negative   (Normal Range: Negative) Leukocyte Esterace: negative   (Normal Range: Negative)     Blood Tests     CBG Random: 204      Appended Document: Diabetes/Hypercholesterolemia   PPD Application    Vaccine Type: PPD     Site: left forearm    Mfr: Sanofi Pasteur    Dose: 0.1 ml    Route: ID    Given by: Vesta Mixer CMA    Exp. Date: 07/11/2010    Lot #: [id number]

## 2010-05-03 NOTE — Consult Note (Signed)
[facility]   Imported By: Roderic Ovens 04/06/2009 11:07:28  _____________________________________________________________________  External Attachment:    Type:   Image     Comment:   External Document

## 2010-05-03 NOTE — Progress Notes (Signed)
Summary: OUT OF INSULIN  Phone Note Call from Patient Call back at Home Phone [phone]   Reason for Call: Refill Medication Summary of Call: [facility] pt. MS Nesbitt CALLED BECAUSE SHE IS OUT OF INSULIN AND SHE NO LONGER CAN AFFORD IT, BECAUSE SHE LOST HER MEDICAID, AND SHE WANTS TO KNOW IF WE HAVE ANY SAMPLES. SHE IS ON 2 DIFFERENT KINDS OF INSULIN. MS Hush SAYS SHE HAD TO GO TO THE ER ABOUT A WEEK AGO, HER SUGAR WAS 502.   I EXPLAINED TO HER TO CALL [facility] FOR [facility] APPT, AND SHE IS DOING THAT. Initial call taken by: Leodis Rains,  February 18, 2010 2:21 PM  Follow-up for Phone Call        Advised that we don't have any samples -- wants to know if you can prescribe anything that she can get at [facility].  Has made appt. at [facility]. for eligibility for 03/22/10.  Dutch Quint RN  February 18, 2010 3:08 PM   Additional Follow-up for Phone Call Additional follow up Details #1::        No samples available -  I've learned that pts can buy insulin over the counter. She can purchase both Novolin R 10 units subcutaneously two times a day  and  Novolin N 15 units subcutaneously two times a day  Or she can speak with the pharmacist about what best to buy.   It will likely need titration because it is not like the lantus and novolog 70/30 that he is taking. encourage pt to get orange card. Additional Follow-up by: Lehman Prom FNP,  February 18, 2010 3:14 PM    Additional Follow-up for Phone Call Additional follow up Details #2::    Pt. advised of Caprisha Bridgett's response/instructions/recommendations.  Verbalizd understanding.  Dutch Quint RN  February 18, 2010 4:41 PM

## 2010-05-03 NOTE — Medication Information (Signed)
Summary: rov/ez  Anticoagulant Therapy  Managed by: Eda Keys, PharmD Referring MD: Shawnie Pons, MD PCP: Dr. Shellia Carwin Supervising MD: Graciela Husbands MD, Viviann Spare Indication 1: Mitral Valve Regurgitation INR POC 1.6 INR RANGE 2-3  Dietary changes: no    Health status changes: no    Bleeding/hemorrhagic complications: no    Recent/future hospitalizations: no    Any changes in medication regimen? no    Recent/future dental: no  Any missed doses?: no       Is patient compliant with meds? yes       Current Medications (verified): 1)  Prodigy Blood Glucose Monitor W/device Kit (Blood Glucose Monitoring Suppl) .... Dispense One Meter  Dx 250.02 2)  Prodigy Blood Glucose Test  Strp (Glucose Blood) .... To Check Blood Sugar Twice Daily  Dx 250.02 3)  Prodigy Lancing Device  Misc (Lancet Devices) .... Check Blood Sugar Twice Daily Dx:250.02 4)  Lisinopril 5 Mg Tabs (Lisinopril) .... One Tablet By Mouth For Blood Pressure 5)  Furosemide 40 Mg Tabs (Furosemide) .... Take One Tablet By Mouth Daily. 6)  Metoprolol Tartrate 25 Mg Tabs (Metoprolol Tartrate) .... 1/2 Tab Two Times A Day 7)  Potassium Chloride Crys Cr 20 Meq Cr-Tabs (Potassium Chloride Crys Cr) .... Take One Tablet By Mouth Daily 8)  Crestor 40 Mg Tabs (Rosuvastatin Calcium) 9)  Warfarin Sodium 2 Mg Tabs (Warfarin Sodium) .... Use As Directed By Anticoagualtion Clinic 10)  Lantus Solostar 100 Unit/ml  Soln (Insulin Glargine) .... 28 Units Every Night 11)  Aspirin 81 Mg  Tbec (Aspirin) .... One By Mouth Every Day 12)  Warfarin Sodium 2 Mg Tabs (Warfarin Sodium) .Marland Kitchen.. 1 1/2 Tabs On Monday and Thursday and 1 Tab All Other Days 13)  Novolog Mix 70/30 Flexpen 70-30 % Susp (Insulin Aspart Prot & Aspart) .... Take 30 Units With Breakfast and 10 Units With Supper  Allergies (verified): 1)  ! Metformin Hcl  Anticoagulation Management History:      Positive risk factors for bleeding include presence of serious comorbidities.   Negative risk factors for bleeding include an age less than 89 years old and no history of CVA/TIA.  The bleeding index is 'intermediate risk'.  Positive CHADS2 values include History of HTN and History of Diabetes.  Negative CHADS2 values include Age > 81 years old and Prior Stroke/CVA/TIA.  Anticoagulation responsible provider: Graciela Husbands MD, Viviann Spare.  INR POC: 1.6.  Exp: 07/2010.    Anticoagulation Management Assessment/Plan:      The patient's current anticoagulation dose is Warfarin sodium 2 mg tabs: Use as directed by Anticoagualtion Clinic, Warfarin sodium 2 mg tabs: 1 1/2 tabs on Monday and Thursday and 1 tab all other days.  The target INR is 2.0-3.0.  The next INR is due 05/11/2009.  Results were reviewed/authorized by Eda Keys, PharmD.  She was notified by Eda Keys.         Prior Anticoagulation Instructions: INR: 1.7 Take 2 tablets today, then increase dose to 1 tablet daily except 1.5 tablets on Mondays and Thursdays Recheck in 1 weeks  Current Anticoagulation Instructions: INR 1.6  Take 2 tablets today.  Then start new schedule of 1.5 tablets on Monday, Thursday, and Saturday.   Return to clinic in 1 week.

## 2010-05-03 NOTE — Progress Notes (Signed)
Summary: Pt req written script for insulin  Phone Note Call from Patient Call back at Home Phone [phone]   Caller: Patient Summary of Call: Pt req new script for insulin. Pt will pick up script when ready.  Pt only have 1 days worth left.  Initial call taken by: Lucy Antigua,  June 24, 2009 2:44 PM  Follow-up for Phone Call        done Follow-up by: Nelwyn Salisbury MD,  June 25, 2009 1:21 PM    Prescriptions: LANTUS SOLOSTAR 100 UNIT/ML  SOLN (INSULIN GLARGINE) 28 units every night  #30 x 11   Entered by:   Raechel Ache, RN   Authorized by:   Nelwyn Salisbury MD   Signed by:   Raechel Ache, RN on 06/25/2009   Method used:   Print then Give to Patient   RxID:   [id number] NOVOLOG MIX 70/30 FLEXPEN 70-30 % SUSP (INSULIN ASPART PROT & ASPART) take 30 units with breakfast and 10 units with supper  #30 x 11   Entered and Authorized by:   Nelwyn Salisbury MD   Signed by:   Nelwyn Salisbury MD on 06/25/2009   Method used:   Print then Give to Patient   RxID:   [id number]

## 2010-05-03 NOTE — Miscellaneous (Signed)
Summary: [facility]  [facility]   Imported By: Roderic Ovens 06/01/2009 15:31:41  _____________________________________________________________________  External Attachment:    Type:   Image     Comment:   External Document

## 2010-05-03 NOTE — Letter (Signed)
Summary: [facility]/MEDICAL VACCINATION/PHYSICIAL EXAM FORM  [facility]/MEDICAL VACCINATION/PHYSICIAL EXAM FORM   Imported By: Arta Bruce 05/29/2008 09:25:49  _____________________________________________________________________  External Attachment:    Type:   Image     Comment:   External Document

## 2010-05-03 NOTE — Medication Information (Signed)
Summary: RX Folder//[facility]/APPROVED  RX Folder//[facility]/APPROVED   Imported By: Arta Bruce 09/10/2009 11:21:47  _____________________________________________________________________  External Attachment:    Type:   Image     Comment:   External Document

## 2010-05-03 NOTE — Progress Notes (Signed)
Summary: NEEDS NEW RX FOR NEEDLES  Phone Note Call from Patient Call back at Home Phone [phone]   Reason for Call: Refill Medication Summary of Call: [place] SAYS THAT SHE NEEDS A NEW SCRIPT FOR HER NEEDLES TO TAKE HER INSULIN, [name] SAYS THAT SHE DOESN'T HAVE ANYMORE.  [facility]ES [facility] [address] Initial call taken by: Leodis Rains,  September 09, 2009 12:53 PM  Follow-up for Phone Call        Sent to N. Daphine Deutscher, FNP.  Dutch Quint RN  September 09, 2009 3:05 PM   Additional Follow-up for Phone Call Additional follow up Details #1::        [facility] Additional Follow-up by: Lehman Prom FNP,  September 09, 2009 4:27 PM    Additional Follow-up for Phone Call Additional follow up Details #2::    Pt. notified of refilled Rx.  Dutch Quint RN  September 09, 2009 4:44 PM  Follow-up by: Dutch Quint RN,  September 09, 2009 4:44 PM  New/Updated Medications: INSULIN SYRINGE 28G X 1/2" 1 ML MISC (INSULIN SYRINGE-NEEDLE U-100) Use with insulin two times a day Prescriptions: INSULIN SYRINGE 28G X 1/2" 1 ML MISC (INSULIN SYRINGE-NEEDLE U-100) Use with insulin two times a day  #100 x 5   Entered and Authorized by:   Lehman Prom FNP   Signed by:   Lehman Prom FNP on 09/09/2009   Method used:   Electronically to        [id number]       [address]       [place], [place]  [address]       Ph: [phone] or [phone]       Fax: [phone]   RxID:   [id number]

## 2010-05-03 NOTE — Medication Information (Signed)
Summary: CCN/ GD  Anticoagulant Therapy  Managed by: Elenora Fender, PharmD Referring MD: Shawnie Pons, MD PCP: Dr. Shellia Carwin Indication 1: Mitral Valve Regurgitation INR POC 1.8 INR RANGE 2-3  Dietary changes: no    Health status changes: no    Bleeding/hemorrhagic complications: no    Recent/future hospitalizations: no    Any changes in medication regimen? no    Recent/future dental: no  Any missed doses?: no       Is patient compliant with meds? yes      Comments: Pt. started warfarin 1/12 and has taken every evening since then.  Current Medications (verified): 1)  Metformin Hcl 500 Mg Tabs (Metformin Hcl) .Marland Kitchen.. 1 Tablet By Mouth Two Times A Day For Blood Sugar 2)  Prodigy Blood Glucose Monitor W/device Kit (Blood Glucose Monitoring Suppl) .... Dispense One Meter  Dx 250.02 3)  Prodigy Blood Glucose Test  Strp (Glucose Blood) .... To Check Blood Sugar Twice Daily  Dx 250.02 4)  Prodigy Lancing Device  Misc (Lancet Devices) .... Check Blood Sugar Twice Daily Dx:250.02 5)  Pravachol 20 Mg Tabs (Pravastatin Sodium) .Marland Kitchen.. 1 Tablet By Mouth Nightly For Cholesterol 6)  Lisinopril 5 Mg Tabs (Lisinopril) .... One Tablet By Mouth For Blood Pressure 7)  Furosemide 40 Mg Tabs (Furosemide) .... Take One Tablet By Mouth Daily. 8)  Metoprolol Tartrate 12.5 Mg Tabs (Metoprolol Tartrate) .... Take 1/2 Tablet By Mouth Twice Daily. 9)  Lisinopril 5 Mg Tabs (Lisinopril) .... Take One Tablet By Mouth Daily 10)  Potassium Chloride Crys Cr 20 Meq Cr-Tabs (Potassium Chloride Crys Cr) .... Take One Tablet By Mouth Daily 11)  Crestor 40 Mg Tabs (Rosuvastatin Calcium) 12)  Warfarin Sodium 2 Mg Tabs (Warfarin Sodium) .... Use As Directed By Anticoagualtion Clinic  Allergies (verified): 1)  ! Metformin Hcl  Anticoagulation Management History:      The patient comes in today for her initial visit for anticoagulation therapy.  Positive risk factors for bleeding include presence of serious  comorbidities.  Negative risk factors for bleeding include an age less than 66 years old and no history of CVA/TIA.  The bleeding index is 'intermediate risk'.  Positive CHADS2 values include History of HTN and History of Diabetes.  Negative CHADS2 values include Age > 27 years old and Prior Stroke/CVA/TIA.  INR POC: 1.8.  Cuvette Lot#: [id number].  Exp: 07/2010.    Anticoagulation Management Assessment/Plan:      The patient's current anticoagulation dose is Warfarin sodium 2 mg tabs: Use as directed by Anticoagualtion Clinic.  The target INR is 2.0-3.0.  The next INR is due 04/26/2009.  Results were reviewed/authorized by Elenora Fender, PharmD.  She was notified by Lew Dawes, PharmD Candidate.         Current Anticoagulation Instructions: INR 1.8  Take 1.5 tablets today then take 1 tablet daily except 1.5 tablets on Monday. Recheck in 1 week.

## 2010-05-03 NOTE — Assessment & Plan Note (Signed)
Summary: EPH   Visit Type:  Post-hospital Primary Provider:  Lehman Prom FNP  CC:  No complains.  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  Has been followed by the plastic surgeons.  DM management has improved, and she is doing better.  Slowly getting better and scheduled to see Dr. [name] who, according to the patient, wants her in cardiac rehab. She has also been following with the plastic surgeon for followup.    April 07, 2009 Surgeon: [name]  PROCEDURE:  Median sternotomy for coronary artery bypass grafting x4   (left internal mammary artery to distal left anterior descending   coronary artery, saphenous vein graft to medial subbranch of ramus   intermediate branch, saphenous vein graft to lateral subbranch of ramus   intermediate branch, saphenous vein graft to distal right coronary   artery, endoscopic saphenous vein harvest from right thigh and right   lower leg) and mitral valve repair (complex valvuloplasty with   triangular plication of anterior leaflet and 26-mm Megan Salon-   Adams IMR ring annuloplasty).      SURGEON:  Salvatore Decent. [name], MD   Current Medications (verified): 1)  Prodigy Blood Glucose Monitor W/device Kit (Blood Glucose Monitoring Suppl) .... Dispense One Meter  Dx 250.02 2)  Prodigy Blood Glucose Test  Strp (Glucose Blood) .... To Check Blood Sugar Twice Daily  Dx 250.02 3)  Prodigy Lancing Device  Misc (Lancet Devices) .... Check Blood Sugar Twice Daily Dx:250.02 4)  Metoprolol Tartrate 25 Mg Tabs (Metoprolol Tartrate) .... 1/2 Tab Two Times A Day 5)  Crestor 40 Mg Tabs (Rosuvastatin Calcium) .... One Tablet By Mouth Nightly 6)  Lantus Solostar 100 Unit/ml  Soln (Insulin Glargine) .... 28 Units Every Night 7)  Aspirin Ec 325 Mg Tbec (Aspirin) .... Take One Tablet By Mouth Daily 8)  Novolog Mix 70/30 Flexpen 70-30 % Susp (Insulin Aspart Prot & Aspart) .... 26 Units Subcutaneously  Daily  Allergies: 1)  ! 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 Tressie Stalker  Family History: Last updated: 04/28/2009  Notable and that the patient's father underwent   coronary artery bypass grafting at a [place] age and the patient's mother  died of massive myocardial infarction at age 2.      Social History: Last updated: 04/28/2009  The patient lives here in [place] with her boyfriend   and has several other family members who live nearby.  She has no  children.  She has a longstanding history of tobacco abuse, smoking in  excess of one pack of cigarettes per day.  She denies any significant  alcohol consumption.      Vital Signs:  Patient profile:   40 year old female Menstrual status:  partial hysterectomy Height:      64 inches Weight:      212.50 pounds BMI:     36.61 Pulse rate:   77 / minute Pulse rhythm:   regular Resp:     18 per minute BP sitting:   110 / 80  (  left arm) Cuff size:   large  Vitals Entered By: Vikki Ports (October 15, 2009 5:01 PM)  Physical Exam  General:  Well developed, well nourished, in no acute distress. Head:  normocephalic and atraumatic Eyes:  PERRLA/EOM intact; conjunctiva and lids normal. Chest Wall:  Large, pendulous breasts that close sternal site, with sternum excavated,  healing well per patient. Breasts:  see above Lungs:  Clear bilaterally to auscultation and percussion. Heart:  PMI non displaced.  Normal S1 and S2.  No definite murmur on exam.  Pulses:  pulses normal in all 4 extremities Extremities:  No clubbing or cyanosis. Neurologic:  Alert and oriented x 3.   Cardiac Cath  Procedure date:  04/01/2009  Findings:       CONCLUSIONS:   1. Moderate reduction in the left ventricular function with an       inferobasal wall motion abnormality, and concomitant mitral       regurgitation, probably due to papillary muscle dysfunction.   2. Moderate pulmonary hypertension secondary to moderate reduction in       the left ventricular function.   3. Severe 3-vessel coronary artery disease in the setting of diabetes       mellitus.      DISPOSITION:  Surgical consultation will be obtained.         TE Echocardiogram  Procedure date:  04/07/2009  Findings:       Study Conclusions    - Left ventricle: Systolic function was mildly reduced. The     estimated ejection fraction was in the range of 45% to 50%.     Hypokinesis of the inferior myocardium.   - Mitral valve: Systolic bowing without prolapse. Prolapse,     involving the middle segment of the anterior leaflet. Mild to     moderate regurgitation directed eccentrically and posteriorly.   Impressions:    - Post repair images demonstrated no residual regurgitation,     stenosis or perivalvular leak. LV function improvement from     pre-bypass images. No other change from pre-bypass images.   Interoperative transesophageal echocardiography.   Echocardiogram  Procedure date:  04/02/2009  Findings:       Study Conclusions    - Left ventricle: Inferobasal hypokinesis The cavity size was     normal. Wall thickness was normal. The estimated ejection fraction     was 55%.   - Mitral valve: MR poorly characterized due to poor acoustic     windows. Eccentric MR directed posteriorly. ?  Ischemic given     inferobasal hypokinesis     Looks mild on TTE but not well seen. Consider TEE if clinically     indicatated Mild regurgitation.   - Left atrium: The atrium was mildly dilated.  EKG  Procedure date:  10/15/2009  Findings:      NSR.  WNL.   Impression & Recommendations:  Problem # 1:  CAD, ARTERY BYPASS GRAFT (ICD-414.04) s/p CABG with mitral valve repair  (leaflet and ring).  Appears stable at present.  No definite murmur or hemodynamic compromise. Her updated medication list for this problem includes:    Metoprolol Tartrate 25 Mg Tabs (Metoprolol tartrate) .Marland Kitchen... 1/2 tab two times a day    Aspirin Ec 325 Mg Tbec (Aspirin) .Marland Kitchen... Take one tablet by mouth daily  Orders: EKG w/ Interpretation (93000)  Problem # 2:  MITRAL REGURGITATION (ICD-396.3) See above.  When sternum is healed, we will eventually want to repeat echo to assess post op  status.  She is stable at present, and would like to get sternum healed first.   Problem # 3:  HYPERTENSION (ICD-401.9) Currently on metop, and reason fo this drug discussed.  Continue for now.  May want to consider ACE when all of this stabilizes  Current medication simplified. Her updated medication list for this problem includes:    Metoprolol Tartrate 25 Mg Tabs (Metoprolol tartrate) .Marland Kitchen... 1/2 tab two times a day    Aspirin Ec 325 Mg Tbec (Aspirin) .Marland Kitchen... Take one tablet by mouth daily  Orders: EKG w/ Interpretation (93000)  Problem # 4:  HYPERLIPIDEMIA (ICD-272.4) Currently on high dose statin. Will assess current status with lipid and liver.  With DM, will try to minimize dose while achieving treatment goals.  Her updated medication list for this problem includes:    Crestor 40 Mg Tabs (Rosuvastatin calcium) ..... One tablet by mouth nightly  Orders: EKG w/ Interpretation (93000)  Problem # 5:  DIABETES MELLITUS, TYPE II (ICD-250.00) Currently requiring insulin.  Insulin resistance, dm, and CAD discussed.   Her  updated medication list for this problem includes:    Lantus Solostar 100 Unit/ml Soln (Insulin glargine) .Marland Kitchen... 28 units every night    Aspirin Ec 325 Mg Tbec (Aspirin) .Marland Kitchen... Take one tablet by mouth daily    Novolog Mix 70/30 Flexpen 70-30 % Susp (Insulin aspart prot & aspart) .Marland Kitchen... 26 units subcutaneously daily  Problem # 6:  TOBACCO USE, QUIT (ICD-V15.82) Important, and interaction with DM, vis a vis, long term risk, discussed in detail.  Patient Instructions: 1)  Your physician recommends that you return for a FASTING LIPID and LIVER Profile---10/19/09, lab opens at 8:30--Nothing to eat or drink after midnight 2)  Your physician recommends that you continue on your current medications as directed. Please refer to the Current Medication list given to you today. 3)  Your physician wants you to follow-up in: 3 MONTHS.  You will receive a reminder letter in the mail two months in advance. If you don't receive a letter, please call our office to schedule the follow-up appointment.

## 2010-05-03 NOTE — Letter (Signed)
Summary: [facility]  [facility]   Imported By: Roderic Ovens 09/22/2009 15:54:22  _____________________________________________________________________  External Attachment:    Type:   Image     Comment:   External Document

## 2010-05-03 NOTE — Medication Information (Signed)
Summary: rov/eac  Anticoagulant Therapy  Managed by: Eda Keys, PharmD Referring MD: Shawnie Pons, MD PCP: Dr. Shellia Carwin Supervising MD: Riley Kill MD, Maisie Fus Indication 1: Mitral Valve Regurgitation INR POC 1.7 INR RANGE 2-3  Dietary changes: no    Health status changes: no    Bleeding/hemorrhagic complications: yes       Details: considerable gum bleeding when brushing teeth  Recent/future hospitalizations: no    Any changes in medication regimen? yes       Details: Hydr/APAP prn and Cephalexin 500 mg TID x 10 days started 05/10/09  Recent/future dental: no  Any missed doses?: no       Is patient compliant with meds? yes      Comments: Have encouraged pt to purchase a new soft bristle toothbrush and call if the bleeding continues.  Current Medications (verified): 1)  Prodigy Blood Glucose Monitor W/device Kit (Blood Glucose Monitoring Suppl) .... Dispense One Meter  Dx 250.02 2)  Prodigy Blood Glucose Test  Strp (Glucose Blood) .... To Check Blood Sugar Twice Daily  Dx 250.02 3)  Prodigy Lancing Device  Misc (Lancet Devices) .... Check Blood Sugar Twice Daily Dx:250.02 4)  Lisinopril 5 Mg Tabs (Lisinopril) .... One Tablet By Mouth For Blood Pressure 5)  Furosemide 40 Mg Tabs (Furosemide) .... Take One Tablet By Mouth Daily. 6)  Metoprolol Tartrate 25 Mg Tabs (Metoprolol Tartrate) .... 1/2 Tab Two Times A Day 7)  Potassium Chloride Crys Cr 20 Meq Cr-Tabs (Potassium Chloride Crys Cr) .... Take One Tablet By Mouth Daily 8)  Crestor 40 Mg Tabs (Rosuvastatin Calcium) 9)  Warfarin Sodium 2 Mg Tabs (Warfarin Sodium) .... Use As Directed By Anticoagualtion Clinic 10)  Lantus Solostar 100 Unit/ml  Soln (Insulin Glargine) .... 28 Units Every Night 11)  Aspirin 81 Mg  Tbec (Aspirin) .... One By Mouth Every Day 12)  Warfarin Sodium 2 Mg Tabs (Warfarin Sodium) .Marland Kitchen.. 1 1/2 Tabs On Monday and Thursday and 1 Tab All Other Days 13)  Novolog Mix 70/30 Flexpen 70-30 % Susp (Insulin Aspart  Prot & Aspart) .... Take 30 Units With Breakfast and 10 Units With Supper 14)  Cephalexin 500 Mg Caps (Cephalexin) .... Take 1 Capsule Three Times A Day For 10 Days 15)  Lortab 10 10-500 Mg Tabs (Hydrocodone-Acetaminophen) .Marland Kitchen.. 1-2 Tablets Every 4-6 Hours As Needed For Pain  Allergies (verified): 1)  ! Metformin Hcl  Anticoagulation Management History:      The patient is taking warfarin and comes in today for a routine follow up visit.  Positive risk factors for bleeding include presence of serious comorbidities.  Negative risk factors for bleeding include an age less than 39 years old and no history of CVA/TIA.  The bleeding index is 'intermediate risk'.  Positive CHADS2 values include History of HTN and History of Diabetes.  Negative CHADS2 values include Age > 89 years old and Prior Stroke/CVA/TIA.  Anticoagulation responsible provider: Riley Kill MD, Maisie Fus.  INR POC: 1.7.  Cuvette Lot#: [id number].  Exp: 07/2010.    Anticoagulation Management Assessment/Plan:      The patient's current anticoagulation dose is Warfarin sodium 2 mg tabs: Use as directed by Anticoagualtion Clinic, Warfarin sodium 2 mg tabs: 1 1/2 tabs on Monday and Thursday and 1 tab all other days.  The target INR is 2.0-3.0.  The next INR is due 05/21/2009.  Results were reviewed/authorized by Eda Keys, PharmD.  She was notified by Eda Keys.         Prior Anticoagulation Instructions:  INR 1.6  Take 2 tablets today.  Then start new schedule of 1.5 tablets on Monday, Thursday, and Saturday.   Return to clinic in 1 week.  Current Anticoagulation Instructions: INR 1.7  Take 1.5 tablets today.  Then start NEW dosing schedule of 1.5 tablets on Monday, Wednesday, and Friday, and take 1 tablet all other days. Return to clinic in 10 days.

## 2010-05-03 NOTE — Letter (Signed)
Summary: [facility]   [facility]   Imported By: Roderic Ovens 05/25/2009 12:57:42  _____________________________________________________________________  External Attachment:    Type:   Image     Comment:   External Document

## 2010-05-03 NOTE — Miscellaneous (Signed)
Summary: [facility]  [facility]   Imported By: Roderic Ovens 09/21/2009 14:50:54  _____________________________________________________________________  External Attachment:    Type:   Image     Comment:   External Document

## 2010-05-03 NOTE — Medication Information (Signed)
Summary: [facility]  Anticoagulant Therapy  Managed by: Inactive Referring MD: Shawnie Pons, MD PCP: Dr. Shellia Carwin Supervising MD: Riley Kill MD, Maisie Fus Indication 1: Mitral Valve Regurgitation INR RANGE 2-3          Comments: 06/14/09- Spoke with pt. she states that Dr. [name] did discontinue her couamdin.   Allergies: 1)  ! Metformin Hcl  Anticoagulation Management History:      Positive risk factors for bleeding include presence of serious comorbidities.  Negative risk factors for bleeding include an age less than 40 years old and no history of CVA/TIA.  The bleeding index is 'intermediate risk'.  Positive CHADS2 values include History of HTN and History of Diabetes.  Negative CHADS2 values include Age > 51 years old and Prior Stroke/CVA/TIA.  Anticoagulation responsible provider: Riley Kill MD, Maisie Fus.  Exp: 07/2010.    Anticoagulation Management Assessment/Plan:      The patient's current anticoagulation dose is Warfarin sodium 2 mg tabs: Use as directed by Anticoagualtion Clinic 1 1/2 tablet 3 x a week rest of the days 1 tablet..  The target INR is 2.0-3.0.  The next INR is due 05/21/2009.  Results were reviewed/authorized by Inactive.         Prior Anticoagulation Instructions: INR 1.7  Take 1.5 tablets today.  Then start NEW dosing schedule of 1.5 tablets on Monday, Wednesday, and Friday, and take 1 tablet all other days. Return to clinic in 10 days.

## 2010-05-03 NOTE — Assessment & Plan Note (Signed)
Summary: eph/ gd   Visit Type:  Post-hospital Primary Provider:  Dr. Shellia Carwin  CC:  Chest pain - Middle of the chesst- Infection .  History of Present Illness: Feels fine.  She saw her primary MD.  She then saw the cardiac surgeon.  They have been treating her lower sternum.  Vicodin has kept her up at night.  Sugars at home have been Ok at this point, from 100-200 usually.  No fevers at the present time.  Denies chills.    Current Medications (verified): 1)  Prodigy Blood Glucose Monitor W/device Kit (Blood Glucose Monitoring Suppl) .... Dispense One Meter  Dx 250.02 2)  Prodigy Blood Glucose Test  Strp (Glucose Blood) .... To Check Blood Sugar Twice Daily  Dx 250.02 3)  Prodigy Lancing Device  Misc (Lancet Devices) .... Check Blood Sugar Twice Daily Dx:250.02 4)  Lisinopril 5 Mg Tabs (Lisinopril) .... One Tablet By Mouth For Blood Pressure 5)  Furosemide 40 Mg Tabs (Furosemide) .... Take One Tablet By Mouth Daily. 6)  Metoprolol Tartrate 25 Mg Tabs (Metoprolol Tartrate) .... 1/2 Tab Two Times A Day 7)  Potassium Chloride Crys Cr 20 Meq Cr-Tabs (Potassium Chloride Crys Cr) .... Take One Tablet By Mouth Daily 8)  Crestor 40 Mg Tabs (Rosuvastatin Calcium) 9)  Warfarin Sodium 2 Mg Tabs (Warfarin Sodium) .... Use As Directed By Anticoagualtion Clinic 1 1/2 Tablet 3 X A Week Rest of The Days 1 Tablet. 10)  Lantus Solostar 100 Unit/ml  Soln (Insulin Glargine) .... 28 Units Every Night 11)  Aspirin 81 Mg  Tbec (Aspirin) .... One By Mouth Every Day 12)  Novolog Mix 70/30 Flexpen 70-30 % Susp (Insulin Aspart Prot & Aspart) .... Take 30 Units With Breakfast and 10 Units With Supper 13)  Cephalexin 500 Mg Caps (Cephalexin) .... Take 1 Capsule Three Times A Day For 10 Days 14)  Hydrocodone-Acetaminophen 10-500 Mg Tabs (Hydrocodone-Acetaminophen) .... As Needed  Allergies: 1)  ! Metformin Hcl  Past History:  Past Medical History: Last updated: 04/28/2009 Blood transfusion Current Problems:    CORONARY ARTERY DISEASE (ICD-414.00)- Severe three-vessel MITRAL REGURGITATION (ICD-396.3)- moderate-severe HYPERTENSION (ICD-401.9) HYPERLIPIDEMIA (ICD-272.4) TOBACCO ABUSE (ICD-305.1) FAMILY HISTORY OF CAD FEMALE 1ST DEGREE RELATIVE <50 (ICD-V17.3) DIABETES MELLITUS, TYPE II, UNCONTROLLED (ICD-250.02) SEBACEOUS CYST (ICD-706.2) HEALTH SCREENING (ICD-V70.0) FIBROCYSTIC BREAST DISEASE (ICD-610.1) ROUTINE GYNECOLOGICAL EXAMINATION (ICD-V72.31)  Past Surgical History: Last updated: 04/28/2009 partial hystectomy - 08/2004 4 vessel CABG and mitral valvuloplasty 04-07-09 per Dr. Tressie Stalker  Vital Signs:  Patient profile:   40 year old female Height:      64 inches Weight:      188.75 pounds BMI:     32.52 Pulse rate:   93 / minute Pulse rhythm:   regular Resp:     18 per minute BP sitting:   102 / 74  (left arm) Cuff size:   large  Vitals Entered By: Vikki Ports (May 11, 2009 4:05 PM)  Physical Exam  General:  Well developed, well nourished, in no acute distress. Head:  normocephalic and atraumatic Eyes:  PERRLA/EOM intact; conjunctiva and lids normal. Chest Wall:  Sternum upper healing well.  Lower packed with gauze and healing at present.   Lungs:  Clear bilaterally to auscultation and percussion. Heart:  Normal S1 and S2.  No rub.  No definite murmur. Abdomen:  Bowel sounds positive; abdomen soft and non-tender without masses, organomegaly, or hernias noted. No hepatosplenomegaly.   EKG  Procedure date:  05/11/2009  Findings:  NSR.  Inferior MI, age indeterminate.  ALMI, age indeterminate.  Delay in R wave progression.  Impression & Recommendations:  Problem # 1:  CAD, ARTERY BYPASS GRAFT (ICD-414.04) stable cardiac wise. The following medications were removed from the medication list:    Warfarin Sodium 2 Mg Tabs (Warfarin sodium) .Marland Kitchen... 1 1/2 tabs on monday and thursday and 1 tab all other days Her updated medication list for this problem includes:     Lisinopril 5 Mg Tabs (Lisinopril) ..... One tablet by mouth for blood pressure    Metoprolol Tartrate 25 Mg Tabs (Metoprolol tartrate) .Marland Kitchen... 1/2 tab two times a day    Warfarin Sodium 2 Mg Tabs (Warfarin sodium) ..... Use as directed by anticoagualtion clinic 1 1/2 tablet 3 x a week rest of the days 1 tablet.    Aspirin 81 Mg Tbec (Aspirin) ..... One by mouth every day  Orders: Echocardiogram (Echo)  Problem # 2:  MITRAL REGURGITATION (ICD-396.3) Status post repair at time of surgery.  Doing well.  Problem # 3:  sternal infection Improving.  Packed at [facility] office.  To be seen again on Monday.  On Ceftin presently.  Dressing changes being done twice daily.  Problem # 4:  TOBACCO ABUSE (ICD-305.1) Stopped  Problem # 5:  DIABETES MELLITUS, TYPE II, UNCONTROLLED (ICD-250.02) Glucoses are improved.  keeping accurate list.  AIC down from 11 to 8. Her updated medication list for this problem includes:    Lisinopril 5 Mg Tabs (Lisinopril) ..... One tablet by mouth for blood pressure    Lantus Solostar 100 Unit/ml Soln (Insulin glargine) .Marland Kitchen... 28 units every night    Aspirin 81 Mg Tbec (Aspirin) ..... One by mouth every day    Novolog Mix 70/30 Flexpen 70-30 % Susp (Insulin aspart prot & aspart) .Marland Kitchen... Take 30 units with breakfast and 10 units with supper  Patient Instructions: 1)  Your physician recommends that you schedule a follow-up appointment in: 3 weeks with Dr. Riley Kill. 2)  Your physician has recommended you make the following change in your medication:     Decrease lasix (furosemide) to 20mg  a day, and decrease potassium to 10 mEQ a day. 3)  Your physician has requested that you have an echocardiogram.  Echocardiography is a painless test that uses sound waves to create images of your heart. It provides your doctor with information about the size and shape of your heart and how well your heart's chambers and valves are working.  This procedure takes approximately one hour. There are no  restrictions for this procedure.

## 2010-05-03 NOTE — Miscellaneous (Signed)
Summary: update med  Clinical Lists Changes  Medications: Removed medication of FUROSEMIDE 40 MG TABS (FUROSEMIDE) Take one tablet by mouth daily. Added new medication of FUROSEMIDE 20 MG TABS (FUROSEMIDE) Take one tablet by mouth daily. Removed medication of POTASSIUM CHLORIDE CRYS CR 20 MEQ CR-TABS (POTASSIUM CHLORIDE CRYS CR) Take one tablet by mouth daily Added new medication of POTASSIUM CHLORIDE CR 10 MEQ CR-CAPS (POTASSIUM CHLORIDE) Take one tablet by mouth daily

## 2010-05-03 NOTE — Progress Notes (Signed)
  Phone Note Call from Patient Call back at [facility] Phone [phone]   Caller: Patient Summary of Call: The pt wants her proviider call her back.   [facility] FNP Initial call taken by: Manon Hilding,  April 13, 2008 11:44 AM  Follow-up for Phone Call        spoke with pt from other phone note.  Phone note complete Follow-up by: Levon Hedger,  April 13, 2008 4:25 PM

## 2010-05-03 NOTE — Assessment & Plan Note (Signed)
Summary: Diabetes/HTN   Vital Signs:  Patient profile:   40 year old female Menstrual status:  partial hysterectomy Weight:      202.3 pounds BMI:     34.85 Temp:     98.4 degrees F oral Pulse rate:   80 / minute Pulse rhythm:   regular Resp:     20 per minute BP sitting:   105 / 78  (left arm) Cuff size:   large  Vitals Entered By: Levon Hedger (September 02, 2009 3:40 PM) CC: follow-up visit [place] [facility] surgery, Lipid Management, Hypertension Management Is Patient Diabetic? Yes Pain Assessment Patient in pain? no      CBG Result 148 CBG Device ID B  Does patient need assistance? Functional Status Self care Ambulation Normal LMP - Character: partial Hysterectomy     Menstrual Status partial hysterectomy Last PAP Result  Specimen Adequacy: Satisfactory for evaluation.   Interpretation/Result:Negative for intraepithelial Lesion or Malignancy.      Primary Care Provider:  Dr. Shellia Carwin  CC:  follow-up visit [place] [facility] surgery, Lipid Management, and Hypertension Management.  History of Present Illness:  Pt into the office for f/u. Pt has been in [facility] several times since her last visit here. S/p CABG x 4 and Mitral valve repair in 04/07/2009 s/p wound infections for which she has been treated  Pt has multiple f/u with cardiology, thoracic surgeon, plastics.   Diabetes Management History:      The patient is a 40 years old female who comes in for evaluation of DM Type 2.  She has not been enrolled in the "Diabetic Education Program".  She states understanding of dietary principles and is following her diet appropriately.  Sensory loss is noted.  Self foot exams are not being performed.  She is checking home blood sugars.  She says that she is not exercising regularly.        Hypoglycemic symptoms are not occurring.  No hyperglycemic symptoms are reported.        Symptoms which suggest diabetic complications include vision problems.  No changes have  been made to her treatment plan since last visit.    Hypertension History:      She denies headache, chest pain, and palpitations.  She notes no problems with any antihypertensive medication side effects.  BP is doing well  - pt is no longer taking the lisinopril - d/c in the hospital.        Positive major cardiovascular risk factors include diabetes, hyperlipidemia, hypertension, and family history for ischemic heart disease (females less than 63 years old & males less than 37 years old).  Negative major cardiovascular risk factors include female age less than 75 years old and non-tobacco-user status.        Positive history for target organ damage include ASHD (either angina/prior MI/prior CABG).  Further assessment for target organ damage reveals no history of cardiac end-organ damage (CHF/LVH), stroke/TIA, peripheral vascular disease, renal insufficiency, or hypertensive retinopathy.    Lipid Management History:      Positive NCEP/ATP III risk factors include diabetes, HDL cholesterol less than 40, family history for ischemic heart disease (females less than 31 years old & males less than 32 years old), hypertension, and ASHD (either angina/prior MI/prior CABG).  Negative NCEP/ATP III risk factors include female age less than 83 years old, no history of early menopause without estrogen hormone replacement, non-tobacco-user status, no prior stroke/TIA, no peripheral vascular disease, and no history of aortic  aneurysm.        The patient states that she does not know about the "Therapeutic Lifestyle Change" diet.  Her compliance with the TLC diet is fair.  The patient does not know about adjunctive measures for cholesterol lowering.  She expresses no side effects from her lipid-lowering medication.  The patient denies any symptoms to suggest myopathy or liver disease.      Habits & Providers  Alcohol-Tobacco-Diet     Alcohol drinks/day: 0     Tobacco Status: quit     Tobacco Counseling: to quit  use of tobacco products     Cigarette Packs/Day: 1     Year Started: 1987     Year Quit: 03/2009  Exercise-Depression-Behavior     Does Patient Exercise: no     Drug Use: no     Seat Belt Use: 100     Sun Exposure: occasionally  Allergies (verified): 1)  ! Metformin Hcl  Review of Systems CV:  Denies chest pain or discomfort. Resp:  Complains of shortness of breath; with exertion - pt has not been to cardiac rehab because she has been in  and out of the hospital. Pt aware that she should try to attend cardiac rehab. GI:  Denies abdominal pain, nausea, and vomiting. Neuro:  Complains of numbness and tingling; in bilateral feet - increasing over the time.  Feet feels like they are falling asleep all the time.  Physical Exam  General:  alert.   Head:  normocephalic.   Eyes:  pupils round.   Chest Wall:  midsternal incision - not well approximated yellow crusted drainage and at bottom increased erythema and some drainage  Lungs:  normal breath sounds.   Heart:  normal rate and regular rhythm.   Msk:  up to the exam table Neurologic:  alert & oriented X3.   Skin:  right thigh - evidence of skin graft - no infection noted Psych:  Oriented X3.    Diabetes Management Exam:    Foot Exam (with socks and/or shoes not present):       Sensory-Monofilament:          Left foot: normal          Right foot: normal   Impression & Recommendations:  Problem # 1:  DIABETES MELLITUS, TYPE II (ICD-250.00) controlled at this time pt is taking insulin as ordered pt to schedule an eye exam The following medications were removed from the medication list:    Lisinopril 5 Mg Tabs (Lisinopril) ..... One tablet by mouth for blood pressure Her updated medication list for this problem includes:    Lantus Solostar 100 Unit/ml Soln (Insulin glargine) .Marland Kitchen... 28 units every night    Aspirin 81 Mg Tbec (Aspirin) ..... One by mouth every day    Novolog Mix 70/30 Flexpen 70-30 % Susp (Insulin aspart prot  & aspart) .Marland Kitchen... 26 units subcutaneously daily  Orders: Capillary Blood Glucose/CBG (16109) UA Dipstick w/o Micro (manual) (60454) T-Urine Microalbumin w/creat. ratio [phone])  Problem # 2:  HYPERTENSION (ICD-401.9) BP is doing well conitnue current well The following medications were removed from the medication list:    Lisinopril 5 Mg Tabs (Lisinopril) ..... One tablet by mouth for blood pressure    Furosemide 20 Mg Tabs (Furosemide) .Marland Kitchen... Take one tablet by mouth daily. Her updated medication list for this problem includes:    Metoprolol Tartrate 25 Mg Tabs (Metoprolol tartrate) .Marland Kitchen... 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 Dispense one meter  dx 250.02 2)  Prodigy Blood Glucose Test Strp (Glucose blood) .... To check blood sugar twice daily  dx 250.02 3)  Prodigy Lancing Device Misc (Lancet devices) .... Check blood sugar twice daily dx:250.02 4)  Metoprolol Tartrate 25 Mg Tabs (Metoprolol tartrate) .... 1/2 tab two times a day 5)  Crestor 40 Mg Tabs (Rosuvastatin calcium) .... One tablet by mouth nightly 6)  Lantus Solostar 100 Unit/ml Soln (Insulin glargine) .... 28 units every night 7)  Aspirin 81 Mg Tbec (Aspirin) .... One by mouth every day 8)  Novolog Mix 70/30 Flexpen 70-30 % Susp (Insulin aspart prot & aspart) .... 26 units subcutaneously daily 9)  Hydrocodone-acetaminophen 10-500 Mg Tabs (Hydrocodone-acetaminophen) .... As needed  Other Orders: Hemoglobin A1C (96045)  Diabetes Management Assessment/Plan:      The following lipid goals have been established for the patient: Total cholesterol goal of 200; LDL cholesterol goal of 100; HDL cholesterol goal of 40; Triglyceride goal of 150.  Her blood pressure goal is < 130/80.    Hypertension Assessment/Plan:      The patient's hypertensive  risk group is category C: Target organ damage and/or diabetes.  Today's blood pressure is 105/78.  Her blood pressure goal is < 130/80.  Lipid Assessment/Plan:      Based on NCEP/ATP III, the patient's risk factor category is "history of coronary disease, peripheral vascular disease, cerebrovascular disease, or aortic aneurysm along with either diabetes, current smoker, or LDL > 130 plus HDL < 40 plus triglycerides > 200".  The patient's lipid goals are as follows: Total cholesterol goal is 200; LDL cholesterol goal is 100; HDL cholesterol goal is 40; Triglyceride goal is 150.    Patient Instructions: 1)  Your diabetes is doing well. 2)  Follow up in this office in 3 months - diabetes 3)  Keep your appointments with the specialist. 4)  Call to make appointment with cardiology. 5)  You can call to make an appointment with Dr. Mitzi Davenport - [facility].  6)  [address] 7)  [place]  [address] 8)  [phone] Prescriptions: CRESTOR 40 MG TABS (ROSUVASTATIN CALCIUM) One tablet by mouth nightly  #30 x 11   Entered and Authorized by:   Lehman Prom FNP   Signed by:   Lehman Prom FNP on 09/02/2009   Method used:   Electronically to        [id number] (retail)       [address]       [place], [place]  [address]       Ph: [phone] or [phone]       Fax: [phone]   RxID:   [id number]    Last LDL:                                                 172 (04/10/2008 9:40:00 PM)        Diabetic Foot Exam    10-g (5.07) Semmes-Weinstein Monofilament Test Performed by: Levon Hedger          Right Foot          Left Foot Visual Inspection  Test Control      normal         normal Site 1         normal         normal Site 2         normal         normal Site 3         normal         normal Site 4         normal         normal Site 5         normal         normal Site 6         normal         normal Site 7         normal         normal Site 8          normal         normal Site 9         normal         normal Site 10         normal         normal  Impression      normal         normal   Laboratory Results   Urine Tests  Date/Time Received: September 02, 2009 3:59 PM   Routine Urinalysis   Color: lt. yellow Appearance: Clear Glucose: negative   (Normal Range: Negative) Bilirubin: negative   (Normal Range: Negative) Ketone: negative   (Normal Range: Negative) Spec. Gravity: >=1.030   (Normal Range: 1.003-1.035) Blood: negative   (Normal Range: Negative) pH: 5.0   (Normal Range: 5.0-8.0) Protein: trace   (Normal Range: Negative) Urobilinogen: 0.2   (Normal Range: 0-1) Nitrite: negative   (Normal Range: Negative) Leukocyte Esterace: negative   (Normal Range: Negative)     Blood Tests   Date/Time Received: September 02, 2009 3:58 PM   HGBA1C: 6.5%   (Normal Range: Non-Diabetic - 3-6%   Control Diabetic - 6-8%) CBG Random:: 148      Laboratory Results   Urine Tests    Routine Urinalysis   Color: lt. yellow Appearance: Clear Glucose: negative   (Normal Range: Negative) Bilirubin: negative   (Normal Range: Negative) Ketone: negative   (Normal Range: Negative) Spec. Gravity: >=1.030   (Normal Range: 1.003-1.035) Blood: negative   (Normal Range: Negative) pH: 5.0   (Normal Range: 5.0-8.0) Protein: trace   (Normal Range: Negative) Urobilinogen: 0.2   (Normal Range: 0-1) Nitrite: negative   (Normal Range: Negative) Leukocyte Esterace: negative   (Normal Range: Negative)     Blood Tests     HGBA1C: 6.5%   (Normal Range: Non-Diabetic - 3-6%   Control Diabetic - 6-8%) CBG Random:: 148mg /dL

## 2010-05-03 NOTE — Progress Notes (Signed)
Summary: [facility]  [facility]   Imported By: Debby Freiberg 09/24/2009 16:55:52  _____________________________________________________________________  External Attachment:    Type:   Image     Comment:   External Document

## 2010-05-03 NOTE — Letter (Signed)
Summary: [facility]  [facility]   Imported By: Maryln Gottron 04/28/2009 10:12:38  _____________________________________________________________________  External Attachment:    Type:   Image     Comment:   External Document

## 2010-05-03 NOTE — Assessment & Plan Note (Signed)
Summary: Complete Physical Exam   Vital Signs:  Patient Profile:   40 Years Old Female Height:     64 inches Weight:      202 pounds BMI:     34.80 BSA:     1.97 Temp:     98.6 degrees F oral Pulse rate:   78 / minute Pulse rhythm:   regular Resp:     18 per minute BP sitting:   140 / 100  (left arm) Cuff size:   regular  Pt. in pain?   no  Vitals Entered By: Armenia Shannon (April 10, 2008 2:24 PM)  Menstrual History: LMP - Character: partial Hysterectomy              Is Patient Diabetic? No CBG Result 226     Chief Complaint:  pt is concerned about being diabetic and she wants her cholestrol check.... pt says when she eat the food feels as if it stay in her throat she thinks it could be acid reflux but did doesn't burn its jus lingers in her throat....  History of Present Illness:  Pt into the office to establish care. She was also scheduled for a CPE. She was last seen by Waldo 3 years ago.  No chronic dx  PMH - none PSH - partial hystectomy 08/2004, ovaries remain  Social - 1ppd since age 33 ETOH -none Drug use - none 2 children Pt is currently a Consulting civil engineer.  She spends the majority of her time on the computer  Pt is fasting today for labs.  Tetanus - last over 10 years ago.    Optho - no glasses or contacts.  She does see some floaters from time to time.  Dentist - last visit last year and she has an appt next week for f/u.  Caffiene intake - excessive; about 2 pots of coffee per day and one 2 liter soda per day. she gets headaches if she does not consume the beverages and must take excedrin     Past Surgical History:    partial hystectomy - 08/2004   Family History:    diabetes  - mother, father,brother    htn - maternal aunt  Social History:    Denies Tobacco    Denies ETOH    Denies drug use    single    2 children   Risk Factors:  Tobacco use:  current    Year started:  1987    Cigarettes:  Yes -- 1 pack(s) per day    Counseled  to quit/cut down tobacco use:  yes Drug use:  no Caffeine use:  5+ drinks per day Alcohol use:  no Exercise:  no Seatbelt use:  100 % Sun Exposure:  occasionally  Family History Risk Factors:    Family History of MI in females < 6 years old:  yes    Family History of MI in males < 56 years old:  yes   Review of Systems  General      Denies fever.  Eyes      Denies blurring.  ENT      Denies earache.  CV      Denies chest pain or discomfort.  Resp      Denies cough.  GI      Denies abdominal pain, nausea, and vomiting.  GU      Complains of genital sores.  MS      Denies joint pain.  Derm  Denies rash.  Neuro      Complains of headaches.  Psych      Denies depression.  Endo      Complains of excessive urination.   Physical Exam  General:     alert and overweight-appearing.   Head:     normocephalic.   Eyes:     No corneal or conjunctival inflammation noted. EOMI. Perrla. Funduscopic exam benign, without hemorrhages, exudates or papilledema. Vision grossly normal. Ears:     clear fluid behing TM, no erythema Nose:     External nasal examination shows no deformity or inflammation. Nasal mucosa are pink and moist without lesions or exudates. Mouth:     some caries and discoloration pharynx pink and moist.   Neck:     supple.   Chest Wall:     no mass.   Breasts:     pendulous right breast with dense, tender tissue mostly in lower quad Lungs:     normal breath sounds.   Heart:     normal rate and regular rhythm.   Abdomen:     obese striae BS x 4, nontender Rectal:     normal sphincter tone.   Msk:     normal ROM.   Pulses:     R radial normal, R dorsalis pedis normal, L radial normal, and L dorsalis pedis normal.   Extremities:     no edema Neurologic:     alert & oriented X3, cranial nerves II-XII intact, and gait normal.   Skin:     tattoos left buttock with tender, circumscribed area Psych:     Oriented X3 and good  eye contact.    Pelvic Exam  Vulva:      left bartholin cyst Urethra and Bladder:      Urethra--normal.   Vagina:      thick white discharge Cervix:      absent Uterus:      absent Adnexa:      no masses bilaterally.   Rectum:      anal sphincter intact.       Impression & Recommendations:  Problem # 1:  ROUTINE GYNECOLOGICAL EXAMINATION (ICD-V72.31) labs done pap done - vaginal tdap given recommend optho and dental exam Orders: UA Dipstick w/o Micro (manual) (28413) Pap Smear, Thin Prep ( Collection of) [phone]) KOH/ WET [place] [phone]) T-General Health Panel (CBCD, CMP, TSH) ([address]) T-Lipid Profile [phone]) T-HIV Antibody  (Reflex) [id number]) T- GC Chlamydia (41660) T-Syphilis Test (RPR) (63016-01093) T-Urine Microalbumin w/creat. ratio (23557 / [address])   Problem # 2:  FIBROCYSTIC BREAST DISEASE (ICD-610.1) pt consumes lots of caffiene advised decrease will get mammgram as she has palpable lumps in right breast most notably at 6 o'clock Orders: Mammogram (Screening) (Mammo)   Problem # 3:  ELEVATED BLOOD PRESSURE WITHOUT DIAGNOSIS OF HYPERTENSION (ICD-796.2) DASH diet weight reduction Orders: EKG w/ Interpretation (93000)   Problem # 4:  TOBACCO ABUSE (ICD-305.1) advised cessation  Problem # 5:  DIABETES MELLITUS, TYPE II, UNCONTROLLED (ICD-250.02) New DX very brief education given to pt will start on medication glucometer Rx given to pt schedule an appt with susie piper for diabetes education, glucometer instruction, portion meal choices pt will need more education at next visit Her updated medication list for this problem includes:    Metformin Hcl 500 Mg Tabs (Metformin hcl) .Marland Kitchen... 1 tablet by mouth two times a day for blood sugar  Orders: Diabetic Clinic Referral (Diabetic)   Complete Medication List: 1)  Metformin Hcl  500 Mg Tabs (Metformin hcl) .Marland Kitchen.. 1 tablet by mouth two times a day for blood sugar 2)  Prodigy Blood  Glucose Monitor W/device Kit (Blood glucose monitoring suppl) .... Dispense one meter  dx 250.02 3)  Prodigy Blood Glucose Test Strp (Glucose blood) .... To check blood sugar twice daily  dx 250.02 4)  Prodigy Lancing Device Misc (Lancet devices) .... Check blood sugar twice daily dx:250.02  Other Orders: Tdap => 16yrs IM (16109) Admin 1st Vaccine (60454)  Colorectal Screening:  Colonoscopy Comments:    no family history of colon cancer normal bowel movements no blood in stools  PAP Screening:    Reviewed PAP smear recommendations:  PAP smear done  PAP Smear Results:  PAP Smear Comments:    s/p hysterctomy 3 years ago no pap in over 3 years no family hx of cervical cancer  Mammogram Screening:    Reviewed Mammogram recommendations:  mammogram ordered  Mammogram Results:  Mammogram Comments:    no family history of breast cancer no self breast exams at home  Osteoporosis Risk Assessment:  Risk Factors for Fracture or Low Bone Density:   Race (White or Asian):     yes   Physically inactive:     yes   Smoking status:       current   High caffeine use:     yes  Immunization & Chemoprophylaxis:    Tetanus vaccine: Tdap  (04/10/2008)   Patient Instructions: 1)  You need to soak in the bathtub and/or apply warm compresses to the area.  This will either allow the body to reabsorb or it will come to a head.  2)  Schedule an appointment with Susie Piper for diabetes 3)  Follow up with n.martin,fnp 1 week after appt with susie 4)  will need foot check, repeat u/a, look at blood sugar log   Prescriptions: PRODIGY LANCING DEVICE  MISC (LANCET DEVICES) check blood sugar twice daily Dx:250.02  #100 x 5   Entered and Authorized by:   Lehman Prom FNP   Signed by:   Lehman Prom FNP on 04/10/2008   Method used:   Print then Give to Patient   RxID:   [id number] PRODIGY BLOOD GLUCOSE TEST  STRP (GLUCOSE BLOOD) To check blood sugar twice daily  Dx 250.02  #100 x 5    Entered and Authorized by:   Lehman Prom FNP   Signed by:   Lehman Prom FNP on 04/10/2008   Method used:   Print then Give to Patient   RxID:   [id number] PRODIGY BLOOD GLUCOSE MONITOR W/DEVICE KIT (BLOOD GLUCOSE MONITORING SUPPL) dispense one meter  Dx 250.02  #1 x 0   Entered and Authorized by:   Lehman Prom FNP   Signed by:   Lehman Prom FNP on 04/10/2008   Method used:   Print then Give to Patient   RxID:   [id number] METFORMIN HCL 500 MG TABS (METFORMIN HCL) 1 tablet by mouth two times a day for blood sugar  #60 x 1   Entered and Authorized by:   Lehman Prom FNP   Signed by:   Lehman Prom FNP on 04/10/2008   Method used:   Print then Give to Patient   RxID:   [id number]   Laboratory Results   Urine Tests  Date/Time Received: April 10, 2008 2:43 PM  Date/Time Reported: April 10, 2008 2:43 PM   Routine Urinalysis   Color: yellow Glucose: >=1000   (Normal Range: Negative) Bilirubin:  negative   (Normal Range: Negative) Ketone: trace (5)   (Normal Range: Negative) Spec. Gravity: >=1.030   (Normal Range: 1.003-1.035) Blood: negative   (Normal Range: Negative) pH: 5.5   (Normal Range: 5.0-8.0) Protein: 30   (Normal Range: Negative) Urobilinogen: 0.2   (Normal Range: 0-1) Nitrite: negative   (Normal Range: Negative) Leukocyte Esterace: negative   (Normal Range: Negative)     Blood Tests   Date/Time Received: April 10, 2008 4:52 PM  Date/Time Reported: April 10, 2008 4:52 PM   HGBA1C: 10.5%   (Normal Range: Non-Diabetic - 3-6%   Control Diabetic - 6-8%) CBG Random:: 226mg /dL     Laboratory Results   Urine Tests    Routine Urinalysis   Color: yellow Glucose: >=1000   (Normal Range: Negative) Bilirubin: negative   (Normal Range: Negative) Ketone: trace (5)   (Normal Range: Negative) Spec. Gravity: >=1.030   (Normal Range: 1.003-1.035) Blood: negative   (Normal Range: Negative) pH: 5.5   (Normal Range:  5.0-8.0) Protein: 30   (Normal Range: Negative) Urobilinogen: 0.2   (Normal Range: 0-1) Nitrite: negative   (Normal Range: Negative) Leukocyte Esterace: negative   (Normal Range: Negative)     Blood Tests     HGBA1C: 10.5%   (Normal Range: Non-Diabetic - 3-6%   Control Diabetic - 6-8%) CBG Random: 226        Tetanus/Td Vaccine    Vaccine Type: Tdap    Site: right deltoid    Mfr: Sanofi Pasteur    Dose: 0.5 ml    Route: IM    Given by: Armenia Shannon    Exp. Date: 03/05/2010    Lot #: [id number]    VIS given: 02/19/07 version given April 10, 2008. AVW:[id number]

## 2010-05-03 NOTE — Letter (Signed)
Summary: Lipid Letter  [facility]  [address] [place], [place] [address]   Phone: [phone]  Fax: [phone]    04/13/2008  Elizabeth Park [address] [place], [place]  [address]  Dear Elizabeth Park:  We have carefully reviewed your last lipid profile from 04/10/2008 and the results are noted below with a summary of recommendations for lipid management.    Cholesterol:       258     Goal: less than 200   HDL "good" Cholesterol:   31     Goal: greater than 40   LDL "bad" Cholesterol:   172     Goal: less than 40   Triglycerides:       273     Goal: less than 150  Your cholesterol was very elevated.  You need to start a low fat, low cholesterol diet. You should have spoken with someone about the need to start medications.  Pap results normal.    Adjunctive Measures (may lower LIPIDS and reduce risk of Heart Attack) include: Aerobic Exercise (20-30 minutes 3-4 times a week) Limit Alcohol Consumption Weight Reduction Aspirin 81 mg a day by mouth (if not allergic or contraindicated)     Current Medications: 1)    Metformin Hcl 500 Mg Tabs (Metformin hcl) .Marland Kitchen.. 1 tablet by mouth two times a day for blood sugar 2)    Prodigy Blood Glucose Monitor W/device Kit (Blood glucose monitoring suppl) .... Dispense one meter  dx 250.02 3)    Prodigy Blood Glucose Test  Strp (Glucose blood) .... To check blood sugar twice daily  dx 250.02 4)    Prodigy Lancing Device  Misc (Lancet devices) .... Check blood sugar twice daily dx:250.02 5)    Keflex 500 Mg Caps (Cephalexin) .Marland Kitchen.. 1 capsule by mouth three times a day for infection 6)    Pravachol 20 Mg Tabs (Pravastatin sodium) .Marland Kitchen.. 1 tablet by mouth nightly for cholesterol  If you have any questions, please call. We appreciate being able to work with you.   Sincerely,    Lehman Prom, FNP [facility]

## 2010-05-03 NOTE — Progress Notes (Signed)
Summary: Pravachol refill  Phone Note Call from Patient Call back at Home Phone [phone]   Caller: Patient Summary of Call: The pt is requesting for refills from her cholesterol medication; [facility] [address] FNP Initial call taken by: Manon Hilding,  April 16, 2008 2:17 PM  Follow-up for Phone Call        forward to provider.......Marland KitchenMikey College CMA  April 17, 2008 5:23 PM   Additional Follow-up for Phone Call Additional follow up Details #1::        med sent to the pharmacy notify pt that she can pick up Additional Follow-up by: Lehman Prom FNP,  April 17, 2008 6:14 PM    Additional Follow-up for Phone Call Additional follow up Details #2::    pt aware Follow-up by: Vesta Mixer CMA,  April 23, 2008 12:37 PM    Prescriptions: PRAVACHOL 20 MG TABS (PRAVASTATIN SODIUM) 1 tablet by mouth nightly for cholesterol  #30 x 2   Entered and Authorized by:   Lehman Prom FNP   Signed by:   Lehman Prom FNP on 04/17/2008   Method used:   Electronically to        [facility]  [address] [phone]* (retail)       [address]       [place], [place]  [address]       Ph: [phone] or [phone]       Fax: [phone]   RxID:   [id number]

## 2010-05-03 NOTE — Assessment & Plan Note (Signed)
Summary: to be re-est/post hos fu/ok per doc/njr   Vital Signs:  Patient profile:   39 year old female Height:      64 inches Weight:      187 pounds BMI:     32.21 Temp:     97.5 degrees F oral Pulse rate:   100 / minute BP sitting:   90 / 60  (left arm) Cuff size:   large  Vitals Entered By: Alfred Levins, CMA (April 26, 2009 1:18 PM) CC: re-establish, hosp f/u   History of Present Illness: Here to re-establish with me after not seeing me for over 2 years. She was hospitalized from 04-01-09 to 04-14-09 after presenting with chest pains. She ruled out for MI but had unstable angina. A cath revealed severe 3 vessel disease and severe mitral regurgitation. She had a 4 vessel CABG per Dr. Tressie Stalker using the LIMA and saphenous grafts, and she had a mitral valve repair with a ring artificial valve. Dr. Riley Kill managed her cardiac care. Her diabetes was managed with insulin. Her EF was mildly low at 45%. She was started on Coumadin, and told to follow up with me. Since going home she has done very well in genral. Her glucoses dropped to the 50s and 60s a couple of times, so the family has decreased her insulin doses a few times. At her current doses she is stable with glucoses in the range of 90 to 150. She is eating a low carbohydrate and low fat diet. She is having somje incisional pain of course but this is tolerable. She takes a short walk twice a day. No SOB. She has not smoked since going into the hospital.   Preventive Screening-Counseling & Management  Alcohol-Tobacco     Smoking Status: quit      Blood Transfusions:  yes.    Current Medications (verified): 1)  Prodigy Blood Glucose Monitor W/device Kit (Blood Glucose Monitoring Suppl) .... Dispense One Meter  Dx 250.02 2)  Prodigy Blood Glucose Test  Strp (Glucose Blood) .... To Check Blood Sugar Twice Daily  Dx 250.02 3)  Prodigy Lancing Device  Misc (Lancet Devices) .... Check Blood Sugar Twice Daily Dx:250.02 4)   Lisinopril 5 Mg Tabs (Lisinopril) .... One Tablet By Mouth For Blood Pressure 5)  Furosemide 40 Mg Tabs (Furosemide) .... Take One Tablet By Mouth Daily. 6)  Metoprolol Tartrate 25 Mg Tabs (Metoprolol Tartrate) .... 1/2 Tab Two Times A Day 7)  Potassium Chloride Crys Cr 20 Meq Cr-Tabs (Potassium Chloride Crys Cr) .... Take One Tablet By Mouth Daily 8)  Crestor 40 Mg Tabs (Rosuvastatin Calcium) 9)  Warfarin Sodium 2 Mg Tabs (Warfarin Sodium) .... Use As Directed By Anticoagualtion Clinic 10)  Lantus Solostar 100 Unit/ml  Soln (Insulin Glargine) .... 28 Units Every Night 11)  Aspirin 81 Mg  Tbec (Aspirin) .... One By Mouth Every Day 12)  Warfarin Sodium 2 Mg Tabs (Warfarin Sodium) .Marland Kitchen.. 1 1/2 Tabs On Monday and Thursday and 1 Tab All Other Days  Allergies (verified): 1)  ! Metformin Hcl  Past History:  Past Medical History: Blood transfusion Diabetes mellitus, type II, insulin dependent Hyperlipidemia Hypertension Coronary artery disease, sees Dr. Riley Kill  Past Surgical History: partial hystectomy - 08/2004 4 vessel CABG and mitral valvuloplasty 04-07-09 per Dr. Tressie Stalker  Family History: diabetes  - mother, father,brother htn - maternal aunt Family History of CAD Female 1st degree relative <50  Social History: Denies ETOH Denies drug use single 2 children Former  Smoker Blood Transfusions:  yes Smoking Status:  quit  Review of Systems  The patient denies anorexia, fever, weight loss, weight gain, vision loss, decreased hearing, hoarseness, chest pain, syncope, peripheral edema, prolonged cough, headaches, hemoptysis, abdominal pain, melena, hematochezia, severe indigestion/heartburn, hematuria, incontinence, genital sores, muscle weakness, suspicious skin lesions, transient blindness, difficulty walking, depression, unusual weight change, abnormal bleeding, enlarged lymph nodes, angioedema, breast masses, and testicular masses.    Physical Exam  General:   Well-developed,well-nourished,in no acute distress; alert,appropriate and cooperative throughout examination Neck:  No deformities, masses, or tenderness noted. Chest Wall:  incisions are clean and healing well Lungs:  Normal respiratory effort, chest expands symmetrically. Lungs are clear to auscultation, no crackles or wheezes. Heart:  Normal rate and regular rhythm. S1 and S2 normal without gallop, murmur, click, rub or other extra sounds.   Impression & Recommendations:  Problem # 1:  CORONARY ARTERY DISEASE (ICD-414.00)  The following medications were removed from the medication list:    Lisinopril 5 Mg Tabs (Lisinopril) .Marland Kitchen... Take one tablet by mouth daily Her updated medication list for this problem includes:    Lisinopril 5 Mg Tabs (Lisinopril) ..... One tablet by mouth for blood pressure    Furosemide 40 Mg Tabs (Furosemide) .Marland Kitchen... Take one tablet by mouth daily.    Metoprolol Tartrate 25 Mg Tabs (Metoprolol tartrate) .Marland Kitchen... 1/2 tab two times a day    Aspirin 81 Mg Tbec (Aspirin) ..... One by mouth every day  Problem # 2:  HYPERTENSION (ICD-401.9)  The following medications were removed from the medication list:    Lisinopril 5 Mg Tabs (Lisinopril) .Marland Kitchen... Take one tablet by mouth daily Her updated medication list for this problem includes:    Lisinopril 5 Mg Tabs (Lisinopril) ..... One tablet by mouth for blood pressure    Furosemide 40 Mg Tabs (Furosemide) .Marland Kitchen... Take one tablet by mouth daily.    Metoprolol Tartrate 25 Mg Tabs (Metoprolol tartrate) .Marland Kitchen... 1/2 tab two times a day  Problem # 3:  HYPERLIPIDEMIA (ICD-272.4)  The following medications were removed from the medication list:    Pravachol 20 Mg Tabs (Pravastatin sodium) .Marland Kitchen... 1 tablet by mouth nightly for cholesterol Her updated medication list for this problem includes:    Crestor 40 Mg Tabs (Rosuvastatin calcium)  Problem # 4:  DIABETES MELLITUS, TYPE II (ICD-250.00)  The following medications were removed from  the medication list:    Metformin Hcl 500 Mg Tabs (Metformin hcl) .Marland Kitchen... 1 tablet by mouth two times a day for blood sugar    Lisinopril 5 Mg Tabs (Lisinopril) .Marland Kitchen... Take one tablet by mouth daily Her updated medication list for this problem includes:    Lisinopril 5 Mg Tabs (Lisinopril) ..... One tablet by mouth for blood pressure    Lantus Solostar 100 Unit/ml Soln (Insulin glargine) .Marland Kitchen... 28 units every night    Aspirin 81 Mg Tbec (Aspirin) ..... One by mouth every day    Novolog Mix 70/30 Flexpen 70-30 % Susp (Insulin aspart prot & aspart) .Marland Kitchen... Take 30 units with breakfast and 10 units with supper  Orders: Venipuncture (52841) TLB-BMP (Basic Metabolic Panel-BMET) (80048-METABOL) TLB-CBC Platelet - w/Differential (85025-CBCD) TLB-A1C / Hgb A1C (Glycohemoglobin) (83036-A1C) TLB-Microalbumin/Creat Ratio, Urine (82043-MALB)  Problem # 5:  TOBACCO ABUSE (ICD-305.1)  Complete Medication List: 1)  Prodigy Blood Glucose Monitor W/device Kit (Blood glucose monitoring suppl) .... Dispense one meter  dx 250.02 2)  Prodigy Blood Glucose Test Strp (Glucose blood) .... To check blood sugar twice daily  dx 250.02 3)  Prodigy Lancing Device Misc (Lancet devices) .... Check blood sugar twice daily dx:250.02 4)  Lisinopril 5 Mg Tabs (Lisinopril) .... One tablet by mouth for blood pressure 5)  Furosemide 40 Mg Tabs (Furosemide) .... Take one tablet by mouth daily. 6)  Metoprolol Tartrate 25 Mg Tabs (Metoprolol tartrate) .... 1/2 tab two times a day 7)  Potassium Chloride Crys Cr 20 Meq Cr-tabs (Potassium chloride crys cr) .... Take one tablet by mouth daily 8)  Crestor 40 Mg Tabs (Rosuvastatin calcium) 9)  Warfarin Sodium 2 Mg Tabs (Warfarin sodium) .... Use as directed by anticoagualtion clinic 10)  Lantus Solostar 100 Unit/ml Soln (Insulin glargine) .... 28 units every night 11)  Aspirin 81 Mg Tbec (Aspirin) .... One by mouth every day 12)  Warfarin Sodium 2 Mg Tabs (Warfarin sodium) .Marland Kitchen.. 1 1/2 tabs  on monday and thursday and 1 tab all other days 13)  Novolog Mix 70/30 Flexpen 70-30 % Susp (Insulin aspart prot & aspart) .... Take 30 units with breakfast and 10 units with supper  Other Orders: Admin 1st Vaccine (46962) Flu Vaccine 45yrs + (95284)  Patient Instructions: 1)  Check  labs today including a HGB anf A1c. Keep insulin doses at their current levels. See Dr. [name] next week and Dr. Riley Kill the next.  2)  Please schedule a follow-up appointment in 1 month.  Flu Vaccine Consent Questions     Do you have a history of severe allergic reactions to this vaccine? no    Any prior history of allergic reactions to egg and/or gelatin? no    Do you have a sensitivity to the preservative Thimersol? no    Do you have a past history of Guillan-Barre Syndrome? no    Do you currently have an acute febrile illness? no    Have you ever had a severe reaction to latex? no    Vaccine information given and explained to patient? yes    Are you currently pregnant? no    Lot Number:[id number]   Exp Date:09/30/2009   Site Given  Left Deltoid IMt Instructions: 1)  Checl labs today including a HGB anf A1c. Keep insulin doses at their current levels. See Dr. [name] next week and Dr. Riley Kill the next.  2)  Please schedule a follow-up appointment in 1 month.    .lbflu

## 2010-05-03 NOTE — Miscellaneous (Signed)
Summary: Eye exam   Diabetes Management History:      The patient is a 40 years old female who comes in for evaluation of DM Type 2.  She has not been enrolled in the "Diabetic Education Program".  She is checking home blood sugars.  She says that she is not exercising regularly.    Diabetes Management Exam:    Eye Exam:       Eye Exam done elsewhere          Date: 09/29/2009          Results: normal          Done by: Dr. Mitzi Davenport  Diabetes Management Assessment/Plan:      The following lipid goals have been established for the patient: Total cholesterol goal of 200; LDL cholesterol goal of 100; HDL cholesterol goal of 40; Triglyceride goal of 150.  Her blood pressure goal is < 130/80.   Clinical Lists Changes  Observations: Added new observation of EYES COMMENT: 10/2010 (10/19/2009 15:15) Added new observation of EYE EXAM BY: Dr. Mitzi Davenport (09/29/2009 15:16) Added new observation of DMEYEEXMRES: normal (09/29/2009 15:16) Added new observation of DIAB EYE EX: normal (09/29/2009 15:16)

## 2010-05-03 NOTE — Miscellaneous (Signed)
Summary: [facility] Cardiac Physician Order/Treatment Plan   [facility] Cardiac Physician Order/Treatment Plan   Imported By: Roderic Ovens 10/20/2009 10:49:22  _____________________________________________________________________  External Attachment:    Type:   Image     Comment:   External Document

## 2010-05-03 NOTE — Letter (Signed)
Summary: [facility]  [facility]   Imported By: Maryln Gottron 05/19/2009 14:24:32  _____________________________________________________________________  External Attachment:    Type:   Image     Comment:   External Document

## 2010-05-05 NOTE — Letter (Signed)
Summary: [facility] Office Visit Note   [facility] Office Visit Note   Imported By: Roderic Ovens 04/01/2010 11:49:48  _____________________________________________________________________  External Attachment:    Type:   Image     Comment:   External Document  Appended Document: [facility] Office Visit Note  Unable to review document in health system records.  Icon said not authorized.  TS

## 2010-06-14 LAB — URINALYSIS, ROUTINE W REFLEX MICROSCOPIC
Bilirubin Urine: NEGATIVE
Glucose, UA: >1000 mg/dL — AB
Hgb urine dipstick: NEGATIVE
Ketones, ur: 40 mg/dL — AB
Nitrite: NEGATIVE
Protein, ur: NEGATIVE mg/dL
Specific Gravity, Urine: 1.04 — ABNORMAL HIGH (ref 1.005–1.030)
Urobilinogen, UA: 0.2 mg/dL (ref 0.0–1.0)
pH: 5 (ref 5.0–8.0)

## 2010-06-14 LAB — BASIC METABOLIC PANEL
BUN: 15 mg/dL (ref 6–23)
CO2: 21 mEq/L (ref 19–32)
Calcium: 9.9 mg/dL (ref 8.4–10.5)
Chloride: 98 mEq/L (ref 96–112)
Creatinine, Ser: 0.8 mg/dL (ref 0.4–1.2)
GFR calc Af Amer: >60 mL/min (ref >60)
GFR calc non Af Amer: >60 mL/min (ref >60)
Glucose, Bld: 421 mg/dL — ABNORMAL HIGH (ref 70–99)
Potassium: 4.4 mEq/L (ref 3.5–5.1)
Sodium: 134 mEq/L — ABNORMAL LOW (ref 135–145)

## 2010-06-14 LAB — URINE MICROSCOPIC-ADD ON

## 2010-06-14 LAB — GLUCOSE, CAPILLARY
Glucose-Capillary: 286 mg/dL — ABNORMAL HIGH (ref 70–99)
Glucose-Capillary: 419 mg/dL — ABNORMAL HIGH (ref 70–99)

## 2010-06-18 ENCOUNTER — Emergency Department (HOSPITAL_BASED_OUTPATIENT_CLINIC_OR_DEPARTMENT_OTHER)
Admission: EM | Admit: 2010-06-18 | Discharge: 2010-06-18 | Disposition: A | Discharge status: Home / Self Care | Payer: Self-pay | Attending: Emergency Medicine | Admitting: Emergency Medicine

## 2010-06-18 DIAGNOSIS — Z79899 Other long term (current) drug therapy: Secondary | ICD-10-CM | POA: Insufficient documentation

## 2010-06-18 DIAGNOSIS — IMO0002 Reserved for concepts with insufficient information to code with codable children: Secondary | ICD-10-CM | POA: Insufficient documentation

## 2010-06-18 DIAGNOSIS — L02219 Cutaneous abscess of trunk, unspecified: Secondary | ICD-10-CM | POA: Insufficient documentation

## 2010-06-18 DIAGNOSIS — I10 Essential (primary) hypertension: Secondary | ICD-10-CM | POA: Insufficient documentation

## 2010-06-18 DIAGNOSIS — E785 Hyperlipidemia, unspecified: Secondary | ICD-10-CM | POA: Insufficient documentation

## 2010-06-18 DIAGNOSIS — E119 Type 2 diabetes mellitus without complications: Secondary | ICD-10-CM | POA: Insufficient documentation

## 2010-06-19 LAB — CBC
HCT: 21.4 % — ABNORMAL LOW (ref 36.0–46.0)
HCT: 24.8 % — ABNORMAL LOW (ref 36.0–46.0)
HCT: 30.4 % — ABNORMAL LOW (ref 36.0–46.0)
HCT: 30.5 % — ABNORMAL LOW (ref 36.0–46.0)
HCT: 30.9 % — ABNORMAL LOW (ref 36.0–46.0)
HCT: 31.5 % — ABNORMAL LOW (ref 36.0–46.0)
HCT: 31.7 % — ABNORMAL LOW (ref 36.0–46.0)
HCT: 32 % — ABNORMAL LOW (ref 36.0–46.0)
HCT: 33.5 % — ABNORMAL LOW (ref 36.0–46.0)
HCT: 38.3 % (ref 36.0–46.0)
HCT: 38.7 % (ref 36.0–46.0)
Hemoglobin: 10.7 g/dL — ABNORMAL LOW (ref 12.0–15.0)
Hemoglobin: 10.8 g/dL — ABNORMAL LOW (ref 12.0–15.0)
Hemoglobin: 10.9 g/dL — ABNORMAL LOW (ref 12.0–15.0)
Hemoglobin: 11 g/dL — ABNORMAL LOW (ref 12.0–15.0)
Hemoglobin: 11.1 g/dL — ABNORMAL LOW (ref 12.0–15.0)
Hemoglobin: 11.2 g/dL — ABNORMAL LOW (ref 12.0–15.0)
Hemoglobin: 11.7 g/dL — ABNORMAL LOW (ref 12.0–15.0)
Hemoglobin: 13.5 g/dL (ref 12.0–15.0)
Hemoglobin: 13.9 g/dL (ref 12.0–15.0)
Hemoglobin: 7.5 g/dL — ABNORMAL LOW (ref 12.0–15.0)
Hemoglobin: 8.7 g/dL — ABNORMAL LOW (ref 12.0–15.0)
MCHC: 34.4 g/dL (ref 30.0–36.0)
MCHC: 34.8 g/dL (ref 30.0–36.0)
MCHC: 34.9 g/dL (ref 30.0–36.0)
MCHC: 35 g/dL (ref 30.0–36.0)
MCHC: 35.1 g/dL (ref 30.0–36.0)
MCHC: 35.1 g/dL (ref 30.0–36.0)
MCHC: 35.2 g/dL (ref 30.0–36.0)
MCHC: 35.2 g/dL (ref 30.0–36.0)
MCHC: 35.3 g/dL (ref 30.0–36.0)
MCHC: 35.8 g/dL (ref 30.0–36.0)
MCHC: 35.9 g/dL (ref 30.0–36.0)
MCV: 90.2 fL (ref 78.0–100.0)
MCV: 90.8 fL (ref 78.0–100.0)
MCV: 90.9 fL (ref 78.0–100.0)
MCV: 91 fL (ref 78.0–100.0)
MCV: 91.2 fL (ref 78.0–100.0)
MCV: 91.4 fL (ref 78.0–100.0)
MCV: 91.7 fL (ref 78.0–100.0)
MCV: 91.8 fL (ref 78.0–100.0)
MCV: 91.9 fL (ref 78.0–100.0)
MCV: 91.9 fL (ref 78.0–100.0)
MCV: 92 fL (ref 78.0–100.0)
Platelets: 134 10*3/uL — ABNORMAL LOW (ref 150–400)
Platelets: 147 10*3/uL — ABNORMAL LOW (ref 150–400)
Platelets: 152 10*3/uL (ref 150–400)
Platelets: 153 10*3/uL (ref 150–400)
Platelets: 163 10*3/uL (ref 150–400)
Platelets: 169 10*3/uL (ref 150–400)
Platelets: 209 10*3/uL (ref 150–400)
Platelets: 239 10*3/uL (ref 150–400)
Platelets: 282 10*3/uL (ref 150–400)
Platelets: 344 10*3/uL (ref 150–400)
Platelets: 395 10*3/uL (ref 150–400)
RBC: 2.33 MIL/uL — ABNORMAL LOW (ref 3.87–5.11)
RBC: 2.7 MIL/uL — ABNORMAL LOW (ref 3.87–5.11)
RBC: 3.34 MIL/uL — ABNORMAL LOW (ref 3.87–5.11)
RBC: 3.35 MIL/uL — ABNORMAL LOW (ref 3.87–5.11)
RBC: 3.37 MIL/uL — ABNORMAL LOW (ref 3.87–5.11)
RBC: 3.45 MIL/uL — ABNORMAL LOW (ref 3.87–5.11)
RBC: 3.49 MIL/uL — ABNORMAL LOW (ref 3.87–5.11)
RBC: 3.49 MIL/uL — ABNORMAL LOW (ref 3.87–5.11)
RBC: 3.67 MIL/uL — ABNORMAL LOW (ref 3.87–5.11)
RBC: 4.16 MIL/uL (ref 3.87–5.11)
RBC: 4.29 MIL/uL (ref 3.87–5.11)
RDW: 12.3 % (ref 11.5–15.5)
RDW: 12.4 % (ref 11.5–15.5)
RDW: 12.4 % (ref 11.5–15.5)
RDW: 12.7 % (ref 11.5–15.5)
RDW: 12.8 % (ref 11.5–15.5)
RDW: 12.9 % (ref 11.5–15.5)
RDW: 13.1 % (ref 11.5–15.5)
RDW: 13.2 % (ref 11.5–15.5)
RDW: 13.3 % (ref 11.5–15.5)
RDW: 13.7 % (ref 11.5–15.5)
RDW: 13.7 % (ref 11.5–15.5)
WBC: 11.1 10*3/uL — ABNORMAL HIGH (ref 4.0–10.5)
WBC: 11.2 10*3/uL — ABNORMAL HIGH (ref 4.0–10.5)
WBC: 12.2 10*3/uL — ABNORMAL HIGH (ref 4.0–10.5)
WBC: 12.2 10*3/uL — ABNORMAL HIGH (ref 4.0–10.5)
WBC: 12.2 10*3/uL — ABNORMAL HIGH (ref 4.0–10.5)
WBC: 13.4 10*3/uL — ABNORMAL HIGH (ref 4.0–10.5)
WBC: 13.8 10*3/uL — ABNORMAL HIGH (ref 4.0–10.5)
WBC: 13.8 10*3/uL — ABNORMAL HIGH (ref 4.0–10.5)
WBC: 14.4 10*3/uL — ABNORMAL HIGH (ref 4.0–10.5)
WBC: 15.7 10*3/uL — ABNORMAL HIGH (ref 4.0–10.5)
WBC: 8.9 10*3/uL (ref 4.0–10.5)

## 2010-06-19 LAB — PROTIME-INR
INR: 1.04 (ref 0.00–1.49)
INR: 1.24 (ref 0.00–1.49)
INR: 1.28 (ref 0.00–1.49)
INR: 1.28 (ref 0.00–1.49)
INR: 1.37 (ref 0.00–1.49)
INR: 1.43 (ref 0.00–1.49)
INR: 1.48 (ref 0.00–1.49)
INR: 1.59 — ABNORMAL HIGH (ref 0.00–1.49)
Prothrombin Time: 13.5 seconds (ref 11.6–15.2)
Prothrombin Time: 15.5 seconds — ABNORMAL HIGH (ref 11.6–15.2)
Prothrombin Time: 15.9 seconds — ABNORMAL HIGH (ref 11.6–15.2)
Prothrombin Time: 15.9 seconds — ABNORMAL HIGH (ref 11.6–15.2)
Prothrombin Time: 16.8 seconds — ABNORMAL HIGH (ref 11.6–15.2)
Prothrombin Time: 17.3 seconds — ABNORMAL HIGH (ref 11.6–15.2)
Prothrombin Time: 17.8 seconds — ABNORMAL HIGH (ref 11.6–15.2)
Prothrombin Time: 18.8 seconds — ABNORMAL HIGH (ref 11.6–15.2)

## 2010-06-19 LAB — BLOOD GAS, ARTERIAL
Acid-Base Excess: 0.4 mmol/L (ref 0.0–2.0)
Bicarbonate: 23.7 mEq/L (ref 20.0–24.0)
FIO2: 0.21 %
O2 Saturation: 95.6 %
Patient temperature: 98.6
TCO2: 24.8 mmol/L (ref 0–100)
pCO2 arterial: 33.4 mmHg — ABNORMAL LOW (ref 35.0–45.0)
pH, Arterial: 7.466 — ABNORMAL HIGH (ref 7.350–7.400)
pO2, Arterial: 72.2 mmHg — ABNORMAL LOW (ref 80.0–100.0)

## 2010-06-19 LAB — POCT I-STAT 4, (NA,K, GLUC, HGB,HCT)
Glucose, Bld: 124 mg/dL — ABNORMAL HIGH (ref 70–99)
Glucose, Bld: 128 mg/dL — ABNORMAL HIGH (ref 70–99)
Glucose, Bld: 132 mg/dL — ABNORMAL HIGH (ref 70–99)
Glucose, Bld: 134 mg/dL — ABNORMAL HIGH (ref 70–99)
Glucose, Bld: 157 mg/dL — ABNORMAL HIGH (ref 70–99)
Glucose, Bld: 173 mg/dL — ABNORMAL HIGH (ref 70–99)
Glucose, Bld: 194 mg/dL — ABNORMAL HIGH (ref 70–99)
HCT: 22 % — ABNORMAL LOW (ref 36.0–46.0)
HCT: 24 % — ABNORMAL LOW (ref 36.0–46.0)
HCT: 25 % — ABNORMAL LOW (ref 36.0–46.0)
HCT: 26 % — ABNORMAL LOW (ref 36.0–46.0)
HCT: 30 % — ABNORMAL LOW (ref 36.0–46.0)
HCT: 34 % — ABNORMAL LOW (ref 36.0–46.0)
HCT: 36 % (ref 36.0–46.0)
Hemoglobin: 10.2 g/dL — ABNORMAL LOW (ref 12.0–15.0)
Hemoglobin: 11.6 g/dL — ABNORMAL LOW (ref 12.0–15.0)
Hemoglobin: 12.2 g/dL (ref 12.0–15.0)
Hemoglobin: 7.5 g/dL — ABNORMAL LOW (ref 12.0–15.0)
Hemoglobin: 8.2 g/dL — ABNORMAL LOW (ref 12.0–15.0)
Hemoglobin: 8.5 g/dL — ABNORMAL LOW (ref 12.0–15.0)
Hemoglobin: 8.8 g/dL — ABNORMAL LOW (ref 12.0–15.0)
Potassium: 3.5 mEq/L (ref 3.5–5.1)
Potassium: 3.7 mEq/L (ref 3.5–5.1)
Potassium: 3.9 mEq/L (ref 3.5–5.1)
Potassium: 3.9 mEq/L (ref 3.5–5.1)
Potassium: 4 mEq/L (ref 3.5–5.1)
Potassium: 4.1 mEq/L (ref 3.5–5.1)
Potassium: 4.1 mEq/L (ref 3.5–5.1)
Sodium: 131 mEq/L — ABNORMAL LOW (ref 135–145)
Sodium: 136 mEq/L (ref 135–145)
Sodium: 137 mEq/L (ref 135–145)
Sodium: 138 mEq/L (ref 135–145)
Sodium: 139 mEq/L (ref 135–145)
Sodium: 140 mEq/L (ref 135–145)
Sodium: 143 mEq/L (ref 135–145)

## 2010-06-19 LAB — POCT I-STAT, CHEM 8
BUN: 10 mg/dL (ref 6–23)
BUN: 11 mg/dL (ref 6–23)
BUN: 13 mg/dL (ref 6–23)
BUN: 6 mg/dL (ref 6–23)
BUN: 7 mg/dL (ref 6–23)
Calcium, Ion: 0.87 mmol/L — ABNORMAL LOW (ref 1.12–1.32)
Calcium, Ion: 1.12 mmol/L (ref 1.12–1.32)
Calcium, Ion: 1.14 mmol/L (ref 1.12–1.32)
Calcium, Ion: 1.18 mmol/L (ref 1.12–1.32)
Calcium, Ion: 1.27 mmol/L (ref 1.12–1.32)
Chloride: 103 mEq/L (ref 96–112)
Chloride: 105 mEq/L (ref 96–112)
Chloride: 108 mEq/L (ref 96–112)
Chloride: 89 mEq/L — ABNORMAL LOW (ref 96–112)
Chloride: 91 mEq/L — ABNORMAL LOW (ref 96–112)
Creatinine, Ser: 0.6 mg/dL (ref 0.4–1.2)
Creatinine, Ser: 0.6 mg/dL (ref 0.4–1.2)
Creatinine, Ser: 0.6 mg/dL (ref 0.4–1.2)
Creatinine, Ser: 0.9 mg/dL (ref 0.4–1.2)
Creatinine, Ser: 1 mg/dL (ref 0.4–1.2)
Glucose, Bld: 117 mg/dL — ABNORMAL HIGH (ref 70–99)
Glucose, Bld: 166 mg/dL — ABNORMAL HIGH (ref 70–99)
Glucose, Bld: 166 mg/dL — ABNORMAL HIGH (ref 70–99)
Glucose, Bld: 173 mg/dL — ABNORMAL HIGH (ref 70–99)
Glucose, Bld: 212 mg/dL — ABNORMAL HIGH (ref 70–99)
HCT: 23 % — ABNORMAL LOW (ref 36.0–46.0)
HCT: 25 % — ABNORMAL LOW (ref 36.0–46.0)
HCT: 30 % — ABNORMAL LOW (ref 36.0–46.0)
HCT: 33 % — ABNORMAL LOW (ref 36.0–46.0)
HCT: 33 % — ABNORMAL LOW (ref 36.0–46.0)
Hemoglobin: 10.2 g/dL — ABNORMAL LOW (ref 12.0–15.0)
Hemoglobin: 11.2 g/dL — ABNORMAL LOW (ref 12.0–15.0)
Hemoglobin: 11.2 g/dL — ABNORMAL LOW (ref 12.0–15.0)
Hemoglobin: 7.8 g/dL — ABNORMAL LOW (ref 12.0–15.0)
Hemoglobin: 8.5 g/dL — ABNORMAL LOW (ref 12.0–15.0)
Potassium: 2 mEq/L — CL (ref 3.5–5.1)
Potassium: 3.2 mEq/L — ABNORMAL LOW (ref 3.5–5.1)
Potassium: 3.4 mEq/L — ABNORMAL LOW (ref 3.5–5.1)
Potassium: 3.9 mEq/L (ref 3.5–5.1)
Potassium: 5 mEq/L (ref 3.5–5.1)
Sodium: 133 mEq/L — ABNORMAL LOW (ref 135–145)
Sodium: 134 mEq/L — ABNORMAL LOW (ref 135–145)
Sodium: 136 mEq/L (ref 135–145)
Sodium: 140 mEq/L (ref 135–145)
Sodium: 144 mEq/L (ref 135–145)
TCO2: 23 mmol/L (ref 0–100)
TCO2: 24 mmol/L (ref 0–100)
TCO2: 24 mmol/L (ref 0–100)
TCO2: 39 mmol/L (ref 0–100)
TCO2: 42 mmol/L (ref 0–100)

## 2010-06-19 LAB — BASIC METABOLIC PANEL
BUN: 13 mg/dL (ref 6–23)
BUN: 14 mg/dL (ref 6–23)
BUN: 15 mg/dL (ref 6–23)
BUN: 22 mg/dL (ref 6–23)
BUN: 22 mg/dL (ref 6–23)
BUN: 7 mg/dL (ref 6–23)
BUN: 8 mg/dL (ref 6–23)
BUN: 8 mg/dL (ref 6–23)
BUN: 8 mg/dL (ref 6–23)
CO2: 23 mEq/L (ref 19–32)
CO2: 24 mEq/L (ref 19–32)
CO2: 27 mEq/L (ref 19–32)
CO2: 27 mEq/L (ref 19–32)
CO2: 28 mEq/L (ref 19–32)
CO2: 28 mEq/L (ref 19–32)
CO2: 32 mEq/L (ref 19–32)
CO2: 35 mEq/L — ABNORMAL HIGH (ref 19–32)
CO2: 38 mEq/L — ABNORMAL HIGH (ref 19–32)
Calcium: 8.1 mg/dL — ABNORMAL LOW (ref 8.4–10.5)
Calcium: 8.1 mg/dL — ABNORMAL LOW (ref 8.4–10.5)
Calcium: 8.3 mg/dL — ABNORMAL LOW (ref 8.4–10.5)
Calcium: 8.6 mg/dL (ref 8.4–10.5)
Calcium: 8.7 mg/dL (ref 8.4–10.5)
Calcium: 8.8 mg/dL (ref 8.4–10.5)
Calcium: 8.9 mg/dL (ref 8.4–10.5)
Calcium: 9 mg/dL (ref 8.4–10.5)
Calcium: 9.1 mg/dL (ref 8.4–10.5)
Chloride: 100 mEq/L (ref 96–112)
Chloride: 100 mEq/L (ref 96–112)
Chloride: 101 mEq/L (ref 96–112)
Chloride: 105 mEq/L (ref 96–112)
Chloride: 87 mEq/L — ABNORMAL LOW (ref 96–112)
Chloride: 92 mEq/L — ABNORMAL LOW (ref 96–112)
Chloride: 96 mEq/L (ref 96–112)
Chloride: 98 mEq/L (ref 96–112)
Chloride: 98 mEq/L (ref 96–112)
Creatinine, Ser: 0.67 mg/dL (ref 0.4–1.2)
Creatinine, Ser: 0.71 mg/dL (ref 0.4–1.2)
Creatinine, Ser: 0.74 mg/dL (ref 0.4–1.2)
Creatinine, Ser: 0.78 mg/dL (ref 0.4–1.2)
Creatinine, Ser: 0.79 mg/dL (ref 0.4–1.2)
Creatinine, Ser: 0.8 mg/dL (ref 0.4–1.2)
Creatinine, Ser: 0.82 mg/dL (ref 0.4–1.2)
Creatinine, Ser: 0.86 mg/dL (ref 0.4–1.2)
Creatinine, Ser: 1.01 mg/dL (ref 0.4–1.2)
GFR calc Af Amer: >60 mL/min (ref >60)
GFR calc Af Amer: >60 mL/min (ref >60)
GFR calc Af Amer: >60 mL/min (ref >60)
GFR calc Af Amer: >60 mL/min (ref >60)
GFR calc Af Amer: >60 mL/min (ref >60)
GFR calc Af Amer: >60 mL/min (ref >60)
GFR calc Af Amer: >60 mL/min (ref >60)
GFR calc Af Amer: >60 mL/min (ref >60)
GFR calc Af Amer: >60 mL/min (ref >60)
GFR calc non Af Amer: >60 mL/min (ref >60)
GFR calc non Af Amer: >60 mL/min (ref >60)
GFR calc non Af Amer: >60 mL/min (ref >60)
GFR calc non Af Amer: >60 mL/min (ref >60)
GFR calc non Af Amer: >60 mL/min (ref >60)
GFR calc non Af Amer: >60 mL/min (ref >60)
GFR calc non Af Amer: >60 mL/min (ref >60)
GFR calc non Af Amer: >60 mL/min (ref >60)
GFR calc non Af Amer: >60 mL/min (ref >60)
Glucose, Bld: 103 mg/dL — ABNORMAL HIGH (ref 70–99)
Glucose, Bld: 116 mg/dL — ABNORMAL HIGH (ref 70–99)
Glucose, Bld: 153 mg/dL — ABNORMAL HIGH (ref 70–99)
Glucose, Bld: 163 mg/dL — ABNORMAL HIGH (ref 70–99)
Glucose, Bld: 203 mg/dL — ABNORMAL HIGH (ref 70–99)
Glucose, Bld: 204 mg/dL — ABNORMAL HIGH (ref 70–99)
Glucose, Bld: 209 mg/dL — ABNORMAL HIGH (ref 70–99)
Glucose, Bld: 220 mg/dL — ABNORMAL HIGH (ref 70–99)
Glucose, Bld: 227 mg/dL — ABNORMAL HIGH (ref 70–99)
Potassium: 2.6 mEq/L — CL (ref 3.5–5.1)
Potassium: 3.4 mEq/L — ABNORMAL LOW (ref 3.5–5.1)
Potassium: 3.4 mEq/L — ABNORMAL LOW (ref 3.5–5.1)
Potassium: 3.5 mEq/L (ref 3.5–5.1)
Potassium: 3.7 mEq/L (ref 3.5–5.1)
Potassium: 3.9 mEq/L (ref 3.5–5.1)
Potassium: 4 mEq/L (ref 3.5–5.1)
Potassium: 4.1 mEq/L (ref 3.5–5.1)
Potassium: 4.1 mEq/L (ref 3.5–5.1)
Sodium: 132 mEq/L — ABNORMAL LOW (ref 135–145)
Sodium: 134 mEq/L — ABNORMAL LOW (ref 135–145)
Sodium: 135 mEq/L (ref 135–145)
Sodium: 135 mEq/L (ref 135–145)
Sodium: 136 mEq/L (ref 135–145)
Sodium: 136 mEq/L (ref 135–145)
Sodium: 136 mEq/L (ref 135–145)
Sodium: 137 mEq/L (ref 135–145)
Sodium: 137 mEq/L (ref 135–145)

## 2010-06-19 LAB — TYPE AND SCREEN
ABO/RH(D): A POS
Antibody Screen: NEGATIVE

## 2010-06-19 LAB — GLUCOSE, CAPILLARY
Glucose-Capillary: 101 mg/dL — ABNORMAL HIGH (ref 70–99)
Glucose-Capillary: 103 mg/dL — ABNORMAL HIGH (ref 70–99)
Glucose-Capillary: 114 mg/dL — ABNORMAL HIGH (ref 70–99)
Glucose-Capillary: 118 mg/dL — ABNORMAL HIGH (ref 70–99)
Glucose-Capillary: 129 mg/dL — ABNORMAL HIGH (ref 70–99)
Glucose-Capillary: 141 mg/dL — ABNORMAL HIGH (ref 70–99)
Glucose-Capillary: 142 mg/dL — ABNORMAL HIGH (ref 70–99)
Glucose-Capillary: 143 mg/dL — ABNORMAL HIGH (ref 70–99)
Glucose-Capillary: 144 mg/dL — ABNORMAL HIGH (ref 70–99)
Glucose-Capillary: 148 mg/dL — ABNORMAL HIGH (ref 70–99)
Glucose-Capillary: 149 mg/dL — ABNORMAL HIGH (ref 70–99)
Glucose-Capillary: 155 mg/dL — ABNORMAL HIGH (ref 70–99)
Glucose-Capillary: 156 mg/dL — ABNORMAL HIGH (ref 70–99)
Glucose-Capillary: 162 mg/dL — ABNORMAL HIGH (ref 70–99)
Glucose-Capillary: 164 mg/dL — ABNORMAL HIGH (ref 70–99)
Glucose-Capillary: 167 mg/dL — ABNORMAL HIGH (ref 70–99)
Glucose-Capillary: 168 mg/dL — ABNORMAL HIGH (ref 70–99)
Glucose-Capillary: 168 mg/dL — ABNORMAL HIGH (ref 70–99)
Glucose-Capillary: 170 mg/dL — ABNORMAL HIGH (ref 70–99)
Glucose-Capillary: 171 mg/dL — ABNORMAL HIGH (ref 70–99)
Glucose-Capillary: 172 mg/dL — ABNORMAL HIGH (ref 70–99)
Glucose-Capillary: 173 mg/dL — ABNORMAL HIGH (ref 70–99)
Glucose-Capillary: 174 mg/dL — ABNORMAL HIGH (ref 70–99)
Glucose-Capillary: 175 mg/dL — ABNORMAL HIGH (ref 70–99)
Glucose-Capillary: 176 mg/dL — ABNORMAL HIGH (ref 70–99)
Glucose-Capillary: 178 mg/dL — ABNORMAL HIGH (ref 70–99)
Glucose-Capillary: 178 mg/dL — ABNORMAL HIGH (ref 70–99)
Glucose-Capillary: 183 mg/dL — ABNORMAL HIGH (ref 70–99)
Glucose-Capillary: 184 mg/dL — ABNORMAL HIGH (ref 70–99)
Glucose-Capillary: 184 mg/dL — ABNORMAL HIGH (ref 70–99)
Glucose-Capillary: 194 mg/dL — ABNORMAL HIGH (ref 70–99)
Glucose-Capillary: 196 mg/dL — ABNORMAL HIGH (ref 70–99)
Glucose-Capillary: 197 mg/dL — ABNORMAL HIGH (ref 70–99)
Glucose-Capillary: 200 mg/dL — ABNORMAL HIGH (ref 70–99)
Glucose-Capillary: 201 mg/dL — ABNORMAL HIGH (ref 70–99)
Glucose-Capillary: 201 mg/dL — ABNORMAL HIGH (ref 70–99)
Glucose-Capillary: 202 mg/dL — ABNORMAL HIGH (ref 70–99)
Glucose-Capillary: 204 mg/dL — ABNORMAL HIGH (ref 70–99)
Glucose-Capillary: 205 mg/dL — ABNORMAL HIGH (ref 70–99)
Glucose-Capillary: 205 mg/dL — ABNORMAL HIGH (ref 70–99)
Glucose-Capillary: 206 mg/dL — ABNORMAL HIGH (ref 70–99)
Glucose-Capillary: 206 mg/dL — ABNORMAL HIGH (ref 70–99)
Glucose-Capillary: 207 mg/dL — ABNORMAL HIGH (ref 70–99)
Glucose-Capillary: 209 mg/dL — ABNORMAL HIGH (ref 70–99)
Glucose-Capillary: 212 mg/dL — ABNORMAL HIGH (ref 70–99)
Glucose-Capillary: 213 mg/dL — ABNORMAL HIGH (ref 70–99)
Glucose-Capillary: 218 mg/dL — ABNORMAL HIGH (ref 70–99)
Glucose-Capillary: 223 mg/dL — ABNORMAL HIGH (ref 70–99)
Glucose-Capillary: 226 mg/dL — ABNORMAL HIGH (ref 70–99)
Glucose-Capillary: 230 mg/dL — ABNORMAL HIGH (ref 70–99)
Glucose-Capillary: 230 mg/dL — ABNORMAL HIGH (ref 70–99)
Glucose-Capillary: 241 mg/dL — ABNORMAL HIGH (ref 70–99)
Glucose-Capillary: 242 mg/dL — ABNORMAL HIGH (ref 70–99)
Glucose-Capillary: 267 mg/dL — ABNORMAL HIGH (ref 70–99)
Glucose-Capillary: 272 mg/dL — ABNORMAL HIGH (ref 70–99)
Glucose-Capillary: 282 mg/dL — ABNORMAL HIGH (ref 70–99)
Glucose-Capillary: 88 mg/dL (ref 70–99)
Glucose-Capillary: 89 mg/dL (ref 70–99)
Glucose-Capillary: 93 mg/dL (ref 70–99)

## 2010-06-19 LAB — POCT I-STAT 3, ART BLOOD GAS (G3+)
Acid-base deficit: 2 mmol/L (ref 0.0–2.0)
Acid-base deficit: 2 mmol/L (ref 0.0–2.0)
Acid-base deficit: 3 mmol/L — ABNORMAL HIGH (ref 0.0–2.0)
Acid-base deficit: 3 mmol/L — ABNORMAL HIGH (ref 0.0–2.0)
Acid-base deficit: 4 mmol/L — ABNORMAL HIGH (ref 0.0–2.0)
Acid-base deficit: 5 mmol/L — ABNORMAL HIGH (ref 0.0–2.0)
Bicarbonate: 21.5 mEq/L (ref 20.0–24.0)
Bicarbonate: 22.5 mEq/L (ref 20.0–24.0)
Bicarbonate: 22.5 mEq/L (ref 20.0–24.0)
Bicarbonate: 22.8 mEq/L (ref 20.0–24.0)
Bicarbonate: 22.9 mEq/L (ref 20.0–24.0)
Bicarbonate: 24.5 mEq/L — ABNORMAL HIGH (ref 20.0–24.0)
Bicarbonate: 26.2 mEq/L — ABNORMAL HIGH (ref 20.0–24.0)
O2 Saturation: 100 %
O2 Saturation: 100 %
O2 Saturation: 82 %
O2 Saturation: 85 %
O2 Saturation: 93 %
O2 Saturation: 94 %
O2 Saturation: 95 %
Patient temperature: 35.5
Patient temperature: 37.2
Patient temperature: 98.6
TCO2: 23 mmol/L (ref 0–100)
TCO2: 24 mmol/L (ref 0–100)
TCO2: 24 mmol/L (ref 0–100)
TCO2: 24 mmol/L (ref 0–100)
TCO2: 24 mmol/L (ref 0–100)
TCO2: 26 mmol/L (ref 0–100)
TCO2: 28 mmol/L (ref 0–100)
pCO2 arterial: 37.8 mmHg (ref 35.0–45.0)
pCO2 arterial: 39.7 mmHg (ref 35.0–45.0)
pCO2 arterial: 39.8 mmHg (ref 35.0–45.0)
pCO2 arterial: 41.7 mmHg (ref 35.0–45.0)
pCO2 arterial: 45.3 mmHg — ABNORMAL HIGH (ref 35.0–45.0)
pCO2 arterial: 49.9 mmHg — ABNORMAL HIGH (ref 35.0–45.0)
pCO2 arterial: 53.2 mmHg — ABNORMAL HIGH (ref 35.0–45.0)
pH, Arterial: 7.271 — ABNORMAL LOW (ref 7.350–7.400)
pH, Arterial: 7.304 — ABNORMAL LOW (ref 7.350–7.400)
pH, Arterial: 7.312 — ABNORMAL LOW (ref 7.350–7.400)
pH, Arterial: 7.328 — ABNORMAL LOW (ref 7.350–7.400)
pH, Arterial: 7.362 (ref 7.350–7.400)
pH, Arterial: 7.367 (ref 7.350–7.400)
pH, Arterial: 7.391 (ref 7.350–7.400)
pO2, Arterial: 225 mmHg — ABNORMAL HIGH (ref 80.0–100.0)
pO2, Arterial: 407 mmHg — ABNORMAL HIGH (ref 80.0–100.0)
pO2, Arterial: 48 mmHg — ABNORMAL LOW (ref 80.0–100.0)
pO2, Arterial: 58 mmHg — ABNORMAL LOW (ref 80.0–100.0)
pO2, Arterial: 69 mmHg — ABNORMAL LOW (ref 80.0–100.0)
pO2, Arterial: 72 mmHg — ABNORMAL LOW (ref 80.0–100.0)
pO2, Arterial: 78 mmHg — ABNORMAL LOW (ref 80.0–100.0)

## 2010-06-19 LAB — COMPREHENSIVE METABOLIC PANEL
ALT: 28 U/L (ref 0–35)
AST: 21 U/L (ref 0–37)
Albumin: 3.1 g/dL — ABNORMAL LOW (ref 3.5–5.2)
Alkaline Phosphatase: 61 U/L (ref 39–117)
BUN: 15 mg/dL (ref 6–23)
CO2: 25 mEq/L (ref 19–32)
Calcium: 9.1 mg/dL (ref 8.4–10.5)
Chloride: 101 mEq/L (ref 96–112)
Creatinine, Ser: 0.73 mg/dL (ref 0.4–1.2)
GFR calc Af Amer: >60 mL/min (ref >60)
GFR calc non Af Amer: >60 mL/min (ref >60)
Glucose, Bld: 225 mg/dL — ABNORMAL HIGH (ref 70–99)
Potassium: 3.8 mEq/L (ref 3.5–5.1)
Sodium: 133 mEq/L — ABNORMAL LOW (ref 135–145)
Total Bilirubin: 0.6 mg/dL (ref 0.3–1.2)
Total Protein: 7.1 g/dL (ref 6.0–8.3)

## 2010-06-19 LAB — URINALYSIS, MICROSCOPIC ONLY
Bilirubin Urine: NEGATIVE
Glucose, UA: NEGATIVE mg/dL
Hgb urine dipstick: NEGATIVE
Ketones, ur: NEGATIVE mg/dL
Leukocytes, UA: NEGATIVE
Nitrite: NEGATIVE
Protein, ur: NEGATIVE mg/dL
Specific Gravity, Urine: 1.025 (ref 1.005–1.030)
Urobilinogen, UA: 0.2 mg/dL (ref 0.0–1.0)
pH: 5.5 (ref 5.0–8.0)

## 2010-06-19 LAB — POCT I-STAT GLUCOSE
Glucose, Bld: 128 mg/dL — ABNORMAL HIGH (ref 70–99)
Glucose, Bld: 176 mg/dL — ABNORMAL HIGH (ref 70–99)
Operator id: 178832
Operator id: 3342

## 2010-06-19 LAB — CREATININE, SERUM
Creatinine, Ser: 0.63 mg/dL (ref 0.4–1.2)
Creatinine, Ser: 0.68 mg/dL (ref 0.4–1.2)
GFR calc Af Amer: >60 mL/min (ref >60)
GFR calc Af Amer: >60 mL/min (ref >60)
GFR calc non Af Amer: >60 mL/min (ref >60)
GFR calc non Af Amer: >60 mL/min (ref >60)

## 2010-06-19 LAB — BRAIN NATRIURETIC PEPTIDE: Pro B Natriuretic peptide (BNP): 934 pg/mL — ABNORMAL HIGH (ref 0.0–100.0)

## 2010-06-19 LAB — LIPID PANEL
Cholesterol: 204 mg/dL — ABNORMAL HIGH (ref 0–200)
HDL: 23 mg/dL — ABNORMAL LOW (ref >39)
LDL Cholesterol: 127 mg/dL — ABNORMAL HIGH (ref 0–99)
Total CHOL/HDL Ratio: 8.9 RATIO
Triglycerides: 269 mg/dL — ABNORMAL HIGH (ref <150)
VLDL: 54 mg/dL — ABNORMAL HIGH (ref 0–40)

## 2010-06-19 LAB — HEMOGLOBIN AND HEMATOCRIT, BLOOD
HCT: 21.4 % — ABNORMAL LOW (ref 36.0–46.0)
Hemoglobin: 7.6 g/dL — ABNORMAL LOW (ref 12.0–15.0)

## 2010-06-19 LAB — MAGNESIUM
Magnesium: 1.8 mg/dL (ref 1.5–2.5)
Magnesium: 2 mg/dL (ref 1.5–2.5)
Magnesium: 2.2 mg/dL (ref 1.5–2.5)
Magnesium: 2.7 mg/dL — ABNORMAL HIGH (ref 1.5–2.5)

## 2010-06-19 LAB — PREPARE RBC (CROSSMATCH)

## 2010-06-19 LAB — APTT
aPTT: 32 seconds (ref 24–37)
aPTT: 36 seconds (ref 24–37)

## 2010-06-19 LAB — CALCIUM, IONIZED: Calcium, Ion: 1.17 mmol/L (ref 1.12–1.32)

## 2010-06-19 LAB — HEMOGLOBIN A1C
Hgb A1c MFr Bld: 11.2 % — ABNORMAL HIGH (ref 4.6–6.1)
Mean Plasma Glucose: 275 mg/dL

## 2010-06-19 LAB — ABO/RH: ABO/RH(D): A POS

## 2010-06-19 LAB — MRSA PCR SCREENING: MRSA by PCR: NEGATIVE

## 2010-06-19 LAB — PHOSPHORUS: Phosphorus: 2.5 mg/dL (ref 2.3–4.6)

## 2010-06-19 LAB — PLATELET COUNT: Platelets: 148 10*3/uL — ABNORMAL LOW (ref 150–400)

## 2010-06-20 LAB — CULTURE, ROUTINE-ABSCESS

## 2010-06-21 ENCOUNTER — Emergency Department (HOSPITAL_BASED_OUTPATIENT_CLINIC_OR_DEPARTMENT_OTHER)
Admission: EM | Admit: 2010-06-21 | Discharge: 2010-06-21 | Disposition: A | Discharge status: Home / Self Care | Payer: Self-pay | Attending: Emergency Medicine | Admitting: Emergency Medicine

## 2010-06-21 DIAGNOSIS — I10 Essential (primary) hypertension: Secondary | ICD-10-CM | POA: Insufficient documentation

## 2010-06-21 DIAGNOSIS — F17200 Nicotine dependence, unspecified, uncomplicated: Secondary | ICD-10-CM | POA: Insufficient documentation

## 2010-06-21 DIAGNOSIS — Z79899 Other long term (current) drug therapy: Secondary | ICD-10-CM | POA: Insufficient documentation

## 2010-06-21 DIAGNOSIS — L02219 Cutaneous abscess of trunk, unspecified: Secondary | ICD-10-CM | POA: Insufficient documentation

## 2010-06-21 DIAGNOSIS — E119 Type 2 diabetes mellitus without complications: Secondary | ICD-10-CM | POA: Insufficient documentation

## 2010-06-21 DIAGNOSIS — E785 Hyperlipidemia, unspecified: Secondary | ICD-10-CM | POA: Insufficient documentation

## 2010-06-21 DIAGNOSIS — IMO0002 Reserved for concepts with insufficient information to code with codable children: Secondary | ICD-10-CM | POA: Insufficient documentation

## 2010-06-21 LAB — GLUCOSE, CAPILLARY
Glucose-Capillary: 114 mg/dL — ABNORMAL HIGH (ref 70–99)
Glucose-Capillary: 114 mg/dL — ABNORMAL HIGH (ref 70–99)
Glucose-Capillary: 117 mg/dL — ABNORMAL HIGH (ref 70–99)
Glucose-Capillary: 121 mg/dL — ABNORMAL HIGH (ref 70–99)
Glucose-Capillary: 124 mg/dL — ABNORMAL HIGH (ref 70–99)
Glucose-Capillary: 126 mg/dL — ABNORMAL HIGH (ref 70–99)
Glucose-Capillary: 126 mg/dL — ABNORMAL HIGH (ref 70–99)
Glucose-Capillary: 128 mg/dL — ABNORMAL HIGH (ref 70–99)
Glucose-Capillary: 139 mg/dL — ABNORMAL HIGH (ref 70–99)
Glucose-Capillary: 141 mg/dL — ABNORMAL HIGH (ref 70–99)
Glucose-Capillary: 141 mg/dL — ABNORMAL HIGH (ref 70–99)
Glucose-Capillary: 144 mg/dL — ABNORMAL HIGH (ref 70–99)
Glucose-Capillary: 145 mg/dL — ABNORMAL HIGH (ref 70–99)
Glucose-Capillary: 147 mg/dL — ABNORMAL HIGH (ref 70–99)
Glucose-Capillary: 147 mg/dL — ABNORMAL HIGH (ref 70–99)
Glucose-Capillary: 150 mg/dL — ABNORMAL HIGH (ref 70–99)
Glucose-Capillary: 160 mg/dL — ABNORMAL HIGH (ref 70–99)
Glucose-Capillary: 162 mg/dL — ABNORMAL HIGH (ref 70–99)
Glucose-Capillary: 164 mg/dL — ABNORMAL HIGH (ref 70–99)
Glucose-Capillary: 167 mg/dL — ABNORMAL HIGH (ref 70–99)
Glucose-Capillary: 173 mg/dL — ABNORMAL HIGH (ref 70–99)
Glucose-Capillary: 179 mg/dL — ABNORMAL HIGH (ref 70–99)
Glucose-Capillary: 224 mg/dL — ABNORMAL HIGH (ref 70–99)
Glucose-Capillary: 101 mg/dL — ABNORMAL HIGH (ref 70–99)
Glucose-Capillary: 101 mg/dL — ABNORMAL HIGH (ref 70–99)
Glucose-Capillary: 101 mg/dL — ABNORMAL HIGH (ref 70–99)
Glucose-Capillary: 101 mg/dL — ABNORMAL HIGH (ref 70–99)
Glucose-Capillary: 102 mg/dL — ABNORMAL HIGH (ref 70–99)
Glucose-Capillary: 102 mg/dL — ABNORMAL HIGH (ref 70–99)
Glucose-Capillary: 103 mg/dL — ABNORMAL HIGH (ref 70–99)
Glucose-Capillary: 104 mg/dL — ABNORMAL HIGH (ref 70–99)
Glucose-Capillary: 105 mg/dL — ABNORMAL HIGH (ref 70–99)
Glucose-Capillary: 105 mg/dL — ABNORMAL HIGH (ref 70–99)
Glucose-Capillary: 106 mg/dL — ABNORMAL HIGH (ref 70–99)
Glucose-Capillary: 106 mg/dL — ABNORMAL HIGH (ref 70–99)
Glucose-Capillary: 106 mg/dL — ABNORMAL HIGH (ref 70–99)
Glucose-Capillary: 110 mg/dL — ABNORMAL HIGH (ref 70–99)
Glucose-Capillary: 112 mg/dL — ABNORMAL HIGH (ref 70–99)
Glucose-Capillary: 114 mg/dL — ABNORMAL HIGH (ref 70–99)
Glucose-Capillary: 114 mg/dL — ABNORMAL HIGH (ref 70–99)
Glucose-Capillary: 114 mg/dL — ABNORMAL HIGH (ref 70–99)
Glucose-Capillary: 115 mg/dL — ABNORMAL HIGH (ref 70–99)
Glucose-Capillary: 115 mg/dL — ABNORMAL HIGH (ref 70–99)
Glucose-Capillary: 120 mg/dL — ABNORMAL HIGH (ref 70–99)
Glucose-Capillary: 120 mg/dL — ABNORMAL HIGH (ref 70–99)
Glucose-Capillary: 120 mg/dL — ABNORMAL HIGH (ref 70–99)
Glucose-Capillary: 122 mg/dL — ABNORMAL HIGH (ref 70–99)
Glucose-Capillary: 122 mg/dL — ABNORMAL HIGH (ref 70–99)
Glucose-Capillary: 124 mg/dL — ABNORMAL HIGH (ref 70–99)
Glucose-Capillary: 124 mg/dL — ABNORMAL HIGH (ref 70–99)
Glucose-Capillary: 125 mg/dL — ABNORMAL HIGH (ref 70–99)
Glucose-Capillary: 125 mg/dL — ABNORMAL HIGH (ref 70–99)
Glucose-Capillary: 127 mg/dL — ABNORMAL HIGH (ref 70–99)
Glucose-Capillary: 129 mg/dL — ABNORMAL HIGH (ref 70–99)
Glucose-Capillary: 130 mg/dL — ABNORMAL HIGH (ref 70–99)
Glucose-Capillary: 130 mg/dL — ABNORMAL HIGH (ref 70–99)
Glucose-Capillary: 132 mg/dL — ABNORMAL HIGH (ref 70–99)
Glucose-Capillary: 134 mg/dL — ABNORMAL HIGH (ref 70–99)
Glucose-Capillary: 136 mg/dL — ABNORMAL HIGH (ref 70–99)
Glucose-Capillary: 137 mg/dL — ABNORMAL HIGH (ref 70–99)
Glucose-Capillary: 137 mg/dL — ABNORMAL HIGH (ref 70–99)
Glucose-Capillary: 139 mg/dL — ABNORMAL HIGH (ref 70–99)
Glucose-Capillary: 139 mg/dL — ABNORMAL HIGH (ref 70–99)
Glucose-Capillary: 140 mg/dL — ABNORMAL HIGH (ref 70–99)
Glucose-Capillary: 142 mg/dL — ABNORMAL HIGH (ref 70–99)
Glucose-Capillary: 143 mg/dL — ABNORMAL HIGH (ref 70–99)
Glucose-Capillary: 144 mg/dL — ABNORMAL HIGH (ref 70–99)
Glucose-Capillary: 149 mg/dL — ABNORMAL HIGH (ref 70–99)
Glucose-Capillary: 150 mg/dL — ABNORMAL HIGH (ref 70–99)
Glucose-Capillary: 151 mg/dL — ABNORMAL HIGH (ref 70–99)
Glucose-Capillary: 151 mg/dL — ABNORMAL HIGH (ref 70–99)
Glucose-Capillary: 153 mg/dL — ABNORMAL HIGH (ref 70–99)
Glucose-Capillary: 155 mg/dL — ABNORMAL HIGH (ref 70–99)
Glucose-Capillary: 155 mg/dL — ABNORMAL HIGH (ref 70–99)
Glucose-Capillary: 156 mg/dL — ABNORMAL HIGH (ref 70–99)
Glucose-Capillary: 156 mg/dL — ABNORMAL HIGH (ref 70–99)
Glucose-Capillary: 156 mg/dL — ABNORMAL HIGH (ref 70–99)
Glucose-Capillary: 157 mg/dL — ABNORMAL HIGH (ref 70–99)
Glucose-Capillary: 159 mg/dL — ABNORMAL HIGH (ref 70–99)
Glucose-Capillary: 163 mg/dL — ABNORMAL HIGH (ref 70–99)
Glucose-Capillary: 167 mg/dL — ABNORMAL HIGH (ref 70–99)
Glucose-Capillary: 171 mg/dL — ABNORMAL HIGH (ref 70–99)
Glucose-Capillary: 174 mg/dL — ABNORMAL HIGH (ref 70–99)
Glucose-Capillary: 175 mg/dL — ABNORMAL HIGH (ref 70–99)
Glucose-Capillary: 177 mg/dL — ABNORMAL HIGH (ref 70–99)
Glucose-Capillary: 182 mg/dL — ABNORMAL HIGH (ref 70–99)
Glucose-Capillary: 183 mg/dL — ABNORMAL HIGH (ref 70–99)
Glucose-Capillary: 193 mg/dL — ABNORMAL HIGH (ref 70–99)
Glucose-Capillary: 196 mg/dL — ABNORMAL HIGH (ref 70–99)
Glucose-Capillary: 199 mg/dL — ABNORMAL HIGH (ref 70–99)
Glucose-Capillary: 203 mg/dL — ABNORMAL HIGH (ref 70–99)
Glucose-Capillary: 206 mg/dL — ABNORMAL HIGH (ref 70–99)
Glucose-Capillary: 71 mg/dL (ref 70–99)
Glucose-Capillary: 82 mg/dL (ref 70–99)
Glucose-Capillary: 82 mg/dL (ref 70–99)
Glucose-Capillary: 87 mg/dL (ref 70–99)
Glucose-Capillary: 87 mg/dL (ref 70–99)
Glucose-Capillary: 91 mg/dL (ref 70–99)
Glucose-Capillary: 93 mg/dL (ref 70–99)
Glucose-Capillary: 99 mg/dL (ref 70–99)

## 2010-06-21 LAB — BASIC METABOLIC PANEL
BUN: 13 mg/dL (ref 6–23)
BUN: 11 mg/dL (ref 6–23)
BUN: 11 mg/dL (ref 6–23)
BUN: 12 mg/dL (ref 6–23)
BUN: 4 mg/dL — ABNORMAL LOW (ref 6–23)
BUN: 7 mg/dL (ref 6–23)
BUN: 8 mg/dL (ref 6–23)
BUN: 9 mg/dL (ref 6–23)
CO2: 27 mEq/L (ref 19–32)
CO2: 25 mEq/L (ref 19–32)
CO2: 25 mEq/L (ref 19–32)
CO2: 25 mEq/L (ref 19–32)
CO2: 26 mEq/L (ref 19–32)
CO2: 26 mEq/L (ref 19–32)
CO2: 26 mEq/L (ref 19–32)
CO2: 27 mEq/L (ref 19–32)
Calcium: 9.5 mg/dL (ref 8.4–10.5)
Calcium: 8.3 mg/dL — ABNORMAL LOW (ref 8.4–10.5)
Calcium: 8.3 mg/dL — ABNORMAL LOW (ref 8.4–10.5)
Calcium: 8.5 mg/dL (ref 8.4–10.5)
Calcium: 8.5 mg/dL (ref 8.4–10.5)
Calcium: 8.7 mg/dL (ref 8.4–10.5)
Calcium: 8.8 mg/dL (ref 8.4–10.5)
Calcium: 8.8 mg/dL (ref 8.4–10.5)
Chloride: 105 mEq/L (ref 96–112)
Chloride: 103 mEq/L (ref 96–112)
Chloride: 104 mEq/L (ref 96–112)
Chloride: 104 mEq/L (ref 96–112)
Chloride: 104 mEq/L (ref 96–112)
Chloride: 105 mEq/L (ref 96–112)
Chloride: 106 mEq/L (ref 96–112)
Chloride: 106 mEq/L (ref 96–112)
Creatinine, Ser: 0.72 mg/dL (ref 0.4–1.2)
Creatinine, Ser: 0.58 mg/dL (ref 0.4–1.2)
Creatinine, Ser: 0.6 mg/dL (ref 0.4–1.2)
Creatinine, Ser: 0.65 mg/dL (ref 0.4–1.2)
Creatinine, Ser: 0.66 mg/dL (ref 0.4–1.2)
Creatinine, Ser: 0.7 mg/dL (ref 0.4–1.2)
Creatinine, Ser: 0.71 mg/dL (ref 0.4–1.2)
Creatinine, Ser: 0.8 mg/dL (ref 0.4–1.2)
GFR calc Af Amer: >60 mL/min (ref >60)
GFR calc Af Amer: >60 mL/min (ref >60)
GFR calc Af Amer: >60 mL/min (ref >60)
GFR calc Af Amer: >60 mL/min (ref >60)
GFR calc Af Amer: >60 mL/min (ref >60)
GFR calc Af Amer: >60 mL/min (ref >60)
GFR calc Af Amer: >60 mL/min (ref >60)
GFR calc Af Amer: >60 mL/min (ref >60)
GFR calc non Af Amer: >60 mL/min (ref >60)
GFR calc non Af Amer: >60 mL/min (ref >60)
GFR calc non Af Amer: >60 mL/min (ref >60)
GFR calc non Af Amer: >60 mL/min (ref >60)
GFR calc non Af Amer: >60 mL/min (ref >60)
GFR calc non Af Amer: >60 mL/min (ref >60)
GFR calc non Af Amer: >60 mL/min (ref >60)
GFR calc non Af Amer: >60 mL/min (ref >60)
Glucose, Bld: 124 mg/dL — ABNORMAL HIGH (ref 70–99)
Glucose, Bld: 149 mg/dL — ABNORMAL HIGH (ref 70–99)
Glucose, Bld: 150 mg/dL — ABNORMAL HIGH (ref 70–99)
Glucose, Bld: 82 mg/dL (ref 70–99)
Glucose, Bld: 85 mg/dL (ref 70–99)
Glucose, Bld: 91 mg/dL (ref 70–99)
Glucose, Bld: 92 mg/dL (ref 70–99)
Glucose, Bld: 99 mg/dL (ref 70–99)
Potassium: 3.8 mEq/L (ref 3.5–5.1)
Potassium: 3 mEq/L — ABNORMAL LOW (ref 3.5–5.1)
Potassium: 3.3 mEq/L — ABNORMAL LOW (ref 3.5–5.1)
Potassium: 3.4 mEq/L — ABNORMAL LOW (ref 3.5–5.1)
Potassium: 3.4 mEq/L — ABNORMAL LOW (ref 3.5–5.1)
Potassium: 3.5 mEq/L (ref 3.5–5.1)
Potassium: 3.5 mEq/L (ref 3.5–5.1)
Potassium: 3.6 mEq/L (ref 3.5–5.1)
Sodium: 138 mEq/L (ref 135–145)
Sodium: 135 mEq/L (ref 135–145)
Sodium: 136 mEq/L (ref 135–145)
Sodium: 136 mEq/L (ref 135–145)
Sodium: 137 mEq/L (ref 135–145)
Sodium: 137 mEq/L (ref 135–145)
Sodium: 138 mEq/L (ref 135–145)
Sodium: 139 mEq/L (ref 135–145)

## 2010-06-21 LAB — TYPE AND SCREEN
ABO/RH(D): A POS
Antibody Screen: POSITIVE
DAT, IgG: NEGATIVE
Donor AG Type: NEGATIVE
Donor AG Type: NEGATIVE

## 2010-06-21 LAB — CBC
HCT: 29.6 % — ABNORMAL LOW (ref 36.0–46.0)
HCT: 33.1 % — ABNORMAL LOW (ref 36.0–46.0)
HCT: 25.3 % — ABNORMAL LOW (ref 36.0–46.0)
HCT: 26.2 % — ABNORMAL LOW (ref 36.0–46.0)
HCT: 26.6 % — ABNORMAL LOW (ref 36.0–46.0)
HCT: 26.9 % — ABNORMAL LOW (ref 36.0–46.0)
HCT: 30.5 % — ABNORMAL LOW (ref 36.0–46.0)
Hemoglobin: 10.4 g/dL — ABNORMAL LOW (ref 12.0–15.0)
Hemoglobin: 11.2 g/dL — ABNORMAL LOW (ref 12.0–15.0)
Hemoglobin: 10.5 g/dL — ABNORMAL LOW (ref 12.0–15.0)
Hemoglobin: 8.9 g/dL — ABNORMAL LOW (ref 12.0–15.0)
Hemoglobin: 9.1 g/dL — ABNORMAL LOW (ref 12.0–15.0)
Hemoglobin: 9.3 g/dL — ABNORMAL LOW (ref 12.0–15.0)
Hemoglobin: 9.4 g/dL — ABNORMAL LOW (ref 12.0–15.0)
MCHC: 33.8 g/dL (ref 30.0–36.0)
MCHC: 35.1 g/dL (ref 30.0–36.0)
MCHC: 34.3 g/dL (ref 30.0–36.0)
MCHC: 34.5 g/dL (ref 30.0–36.0)
MCHC: 34.9 g/dL (ref 30.0–36.0)
MCHC: 35.1 g/dL (ref 30.0–36.0)
MCHC: 35.2 g/dL (ref 30.0–36.0)
MCV: 88.8 fL (ref 78.0–100.0)
MCV: 89.8 fL (ref 78.0–100.0)
MCV: 88.8 fL (ref 78.0–100.0)
MCV: 88.9 fL (ref 78.0–100.0)
MCV: 88.9 fL (ref 78.0–100.0)
MCV: 89.7 fL (ref 78.0–100.0)
MCV: 90.6 fL (ref 78.0–100.0)
Platelets: 377 10*3/uL (ref 150–400)
Platelets: 462 10*3/uL — ABNORMAL HIGH (ref 150–400)
Platelets: 465 10*3/uL — ABNORMAL HIGH (ref 150–400)
Platelets: 494 10*3/uL — ABNORMAL HIGH (ref 150–400)
Platelets: 534 10*3/uL — ABNORMAL HIGH (ref 150–400)
Platelets: 550 10*3/uL — ABNORMAL HIGH (ref 150–400)
Platelets: 590 10*3/uL — ABNORMAL HIGH (ref 150–400)
RBC: 3.34 MIL/uL — ABNORMAL LOW (ref 3.87–5.11)
RBC: 3.68 MIL/uL — ABNORMAL LOW (ref 3.87–5.11)
RBC: 2.84 MIL/uL — ABNORMAL LOW (ref 3.87–5.11)
RBC: 2.95 MIL/uL — ABNORMAL LOW (ref 3.87–5.11)
RBC: 2.96 MIL/uL — ABNORMAL LOW (ref 3.87–5.11)
RBC: 2.97 MIL/uL — ABNORMAL LOW (ref 3.87–5.11)
RBC: 3.43 MIL/uL — ABNORMAL LOW (ref 3.87–5.11)
RDW: 15 % (ref 11.5–15.5)
RDW: 15.1 % (ref 11.5–15.5)
RDW: 14.7 % (ref 11.5–15.5)
RDW: 14.7 % (ref 11.5–15.5)
RDW: 15.3 % (ref 11.5–15.5)
RDW: 15.4 % (ref 11.5–15.5)
RDW: 15.5 % (ref 11.5–15.5)
WBC: 6.6 10*3/uL (ref 4.0–10.5)
WBC: 7.9 10*3/uL (ref 4.0–10.5)
WBC: 10.3 10*3/uL (ref 4.0–10.5)
WBC: 10.8 10*3/uL — ABNORMAL HIGH (ref 4.0–10.5)
WBC: 11.3 10*3/uL — ABNORMAL HIGH (ref 4.0–10.5)
WBC: 12.2 10*3/uL — ABNORMAL HIGH (ref 4.0–10.5)
WBC: 14.9 10*3/uL — ABNORMAL HIGH (ref 4.0–10.5)

## 2010-06-21 LAB — VANCOMYCIN, TROUGH
Vancomycin Tr: 17.9 ug/mL (ref 10.0–20.0)
Vancomycin Tr: 11.7 ug/mL (ref 10.0–20.0)
Vancomycin Tr: 14.6 ug/mL (ref 10.0–20.0)
Vancomycin Tr: 21.8 ug/mL — ABNORMAL HIGH (ref 10.0–20.0)
Vancomycin Tr: 22.8 ug/mL — ABNORMAL HIGH (ref 10.0–20.0)

## 2010-06-21 LAB — CULTURE, ROUTINE-ABSCESS

## 2010-06-21 LAB — PREALBUMIN
Prealbumin: 12.3 mg/dL — ABNORMAL LOW (ref 18.0–45.0)
Prealbumin: 8.5 mg/dL — ABNORMAL LOW (ref 18.0–45.0)

## 2010-06-21 LAB — MRSA PCR SCREENING: MRSA by PCR: NEGATIVE

## 2010-06-21 LAB — PREPARE RBC (CROSSMATCH)

## 2010-06-22 LAB — GLUCOSE, CAPILLARY
Glucose-Capillary: 104 mg/dL — ABNORMAL HIGH (ref 70–99)
Glucose-Capillary: 117 mg/dL — ABNORMAL HIGH (ref 70–99)
Glucose-Capillary: 120 mg/dL — ABNORMAL HIGH (ref 70–99)
Glucose-Capillary: 139 mg/dL — ABNORMAL HIGH (ref 70–99)
Glucose-Capillary: 143 mg/dL — ABNORMAL HIGH (ref 70–99)
Glucose-Capillary: 144 mg/dL — ABNORMAL HIGH (ref 70–99)
Glucose-Capillary: 150 mg/dL — ABNORMAL HIGH (ref 70–99)
Glucose-Capillary: 162 mg/dL — ABNORMAL HIGH (ref 70–99)
Glucose-Capillary: 222 mg/dL — ABNORMAL HIGH (ref 70–99)
Glucose-Capillary: 117 mg/dL — ABNORMAL HIGH (ref 70–99)
Glucose-Capillary: 117 mg/dL — ABNORMAL HIGH (ref 70–99)
Glucose-Capillary: 124 mg/dL — ABNORMAL HIGH (ref 70–99)

## 2010-06-22 LAB — COMPREHENSIVE METABOLIC PANEL
ALT: 13 U/L (ref 0–35)
ALT: 13 U/L (ref 0–35)
AST: 13 U/L (ref 0–37)
AST: 13 U/L (ref 0–37)
Albumin: 3 g/dL — ABNORMAL LOW (ref 3.5–5.2)
Albumin: 3.6 g/dL (ref 3.5–5.2)
Alkaline Phosphatase: 92 U/L (ref 39–117)
Alkaline Phosphatase: 61 U/L (ref 39–117)
BUN: 9 mg/dL (ref 6–23)
BUN: 11 mg/dL (ref 6–23)
CO2: 23 mEq/L (ref 19–32)
CO2: 22 mEq/L (ref 19–32)
Calcium: 9.1 mg/dL (ref 8.4–10.5)
Calcium: 9.7 mg/dL (ref 8.4–10.5)
Chloride: 97 mEq/L (ref 96–112)
Chloride: 105 mEq/L (ref 96–112)
Creatinine, Ser: 0.74 mg/dL (ref 0.4–1.2)
Creatinine, Ser: 0.64 mg/dL (ref 0.4–1.2)
GFR calc Af Amer: >60 mL/min (ref >60)
GFR calc Af Amer: >60 mL/min (ref >60)
GFR calc non Af Amer: >60 mL/min (ref >60)
GFR calc non Af Amer: >60 mL/min (ref >60)
Glucose, Bld: 239 mg/dL — ABNORMAL HIGH (ref 70–99)
Glucose, Bld: 81 mg/dL (ref 70–99)
Potassium: 3.4 mEq/L — ABNORMAL LOW (ref 3.5–5.1)
Potassium: 4.5 mEq/L (ref 3.5–5.1)
Sodium: 131 mEq/L — ABNORMAL LOW (ref 135–145)
Sodium: 134 mEq/L — ABNORMAL LOW (ref 135–145)
Total Bilirubin: 0.5 mg/dL (ref 0.3–1.2)
Total Bilirubin: 0.6 mg/dL (ref 0.3–1.2)
Total Protein: 8.4 g/dL — ABNORMAL HIGH (ref 6.0–8.3)
Total Protein: 7.4 g/dL (ref 6.0–8.3)

## 2010-06-22 LAB — TYPE AND SCREEN
ABO/RH(D): A POS
ABO/RH(D): A POS
Antibody Screen: POSITIVE
Antibody Screen: NEGATIVE
DAT, IgG: POSITIVE
PT AG Type: NEGATIVE

## 2010-06-22 LAB — CULTURE, BLOOD (ROUTINE X 2)
Culture: NO GROWTH
Culture: NO GROWTH

## 2010-06-22 LAB — CBC
HCT: 33.9 % — ABNORMAL LOW (ref 36.0–46.0)
HCT: 36.7 % (ref 36.0–46.0)
Hemoglobin: 11.5 g/dL — ABNORMAL LOW (ref 12.0–15.0)
Hemoglobin: 12.5 g/dL (ref 12.0–15.0)
MCHC: 33.8 g/dL (ref 30.0–36.0)
MCHC: 34.1 g/dL (ref 30.0–36.0)
MCV: 90.7 fL (ref 78.0–100.0)
MCV: 90.3 fL (ref 78.0–100.0)
Platelets: 535 10*3/uL — ABNORMAL HIGH (ref 150–400)
Platelets: 415 10*3/uL — ABNORMAL HIGH (ref 150–400)
RBC: 3.74 MIL/uL — ABNORMAL LOW (ref 3.87–5.11)
RBC: 4.07 MIL/uL (ref 3.87–5.11)
RDW: 15.6 % — ABNORMAL HIGH (ref 11.5–15.5)
RDW: 14.8 % (ref 11.5–15.5)
WBC: 24 10*3/uL — ABNORMAL HIGH (ref 4.0–10.5)
WBC: 10.9 10*3/uL — ABNORMAL HIGH (ref 4.0–10.5)

## 2010-06-22 LAB — URINALYSIS, ROUTINE W REFLEX MICROSCOPIC
Bilirubin Urine: NEGATIVE
Glucose, UA: NEGATIVE mg/dL
Glucose, UA: NEGATIVE mg/dL
Hgb urine dipstick: NEGATIVE
Hgb urine dipstick: NEGATIVE
Ketones, ur: 40 mg/dL — AB
Ketones, ur: NEGATIVE mg/dL
Nitrite: NEGATIVE
Nitrite: NEGATIVE
Protein, ur: NEGATIVE mg/dL
Protein, ur: NEGATIVE mg/dL
Specific Gravity, Urine: 1.036 — ABNORMAL HIGH (ref 1.005–1.030)
Specific Gravity, Urine: 1.022 (ref 1.005–1.030)
Urobilinogen, UA: 1 mg/dL (ref 0.0–1.0)
Urobilinogen, UA: 1 mg/dL (ref 0.0–1.0)
pH: 5.5 (ref 5.0–8.0)
pH: 8 (ref 5.0–8.0)

## 2010-06-22 LAB — WOUND CULTURE

## 2010-06-22 LAB — MRSA PCR SCREENING: MRSA by PCR: POSITIVE — AB

## 2010-06-22 LAB — BLOOD GAS, ARTERIAL
Acid-base deficit: 0 mmol/L (ref 0.0–2.0)
Bicarbonate: 23.4 mEq/L (ref 20.0–24.0)
Drawn by: 296031
FIO2: 0.21 %
O2 Saturation: 97.9 %
Patient temperature: 98.6
TCO2: 24.5 mmol/L (ref 0–100)
pCO2 arterial: 33.9 mmHg — ABNORMAL LOW (ref 35.0–45.0)
pH, Arterial: 7.454 — ABNORMAL HIGH (ref 7.350–7.400)
pO2, Arterial: 93.5 mmHg (ref 80.0–100.0)

## 2010-06-22 LAB — ANAEROBIC CULTURE

## 2010-06-22 LAB — PROTIME-INR
INR: 1.4 (ref 0.00–1.49)
INR: 1.47 (ref 0.00–1.49)
Prothrombin Time: 17 seconds — ABNORMAL HIGH (ref 11.6–15.2)
Prothrombin Time: 17.7 seconds — ABNORMAL HIGH (ref 11.6–15.2)

## 2010-06-22 LAB — APTT
aPTT: 36 seconds (ref 24–37)
aPTT: 37 seconds (ref 24–37)

## 2010-06-27 ENCOUNTER — Emergency Department (HOSPITAL_BASED_OUTPATIENT_CLINIC_OR_DEPARTMENT_OTHER)
Admission: EM | Admit: 2010-06-27 | Discharge: 2010-06-27 | Disposition: A | Discharge status: Home / Self Care | Payer: Self-pay | Attending: Emergency Medicine | Admitting: Emergency Medicine

## 2010-06-27 DIAGNOSIS — Z79899 Other long term (current) drug therapy: Secondary | ICD-10-CM | POA: Insufficient documentation

## 2010-06-27 DIAGNOSIS — I10 Essential (primary) hypertension: Secondary | ICD-10-CM | POA: Insufficient documentation

## 2010-06-27 DIAGNOSIS — E119 Type 2 diabetes mellitus without complications: Secondary | ICD-10-CM | POA: Insufficient documentation

## 2010-06-27 DIAGNOSIS — E785 Hyperlipidemia, unspecified: Secondary | ICD-10-CM | POA: Insufficient documentation

## 2010-06-27 DIAGNOSIS — F17200 Nicotine dependence, unspecified, uncomplicated: Secondary | ICD-10-CM | POA: Insufficient documentation

## 2010-06-27 DIAGNOSIS — Z09 Encounter for follow-up examination after completed treatment for conditions other than malignant neoplasm: Secondary | ICD-10-CM | POA: Insufficient documentation

## 2010-07-04 LAB — CBC
HCT: 37.9 % (ref 36.0–46.0)
HCT: 43.2 % (ref 36.0–46.0)
Hemoglobin: 13.2 g/dL (ref 12.0–15.0)
Hemoglobin: 14.9 g/dL (ref 12.0–15.0)
MCHC: 34.4 g/dL (ref 30.0–36.0)
MCHC: 34.8 g/dL (ref 30.0–36.0)
MCV: 91.1 fL (ref 78.0–100.0)
MCV: 92.8 fL (ref 78.0–100.0)
Platelets: 246 10*3/uL (ref 150–400)
Platelets: 277 10*3/uL (ref 150–400)
RBC: 4.09 MIL/uL (ref 3.87–5.11)
RBC: 4.74 MIL/uL (ref 3.87–5.11)
RDW: 12.6 % (ref 11.5–15.5)
RDW: 13 % (ref 11.5–15.5)
WBC: 10.6 10*3/uL — ABNORMAL HIGH (ref 4.0–10.5)
WBC: 9.6 10*3/uL (ref 4.0–10.5)

## 2010-07-04 LAB — POCT I-STAT 3, ART BLOOD GAS (G3+)
Acid-base deficit: 3 mmol/L — ABNORMAL HIGH (ref 0.0–2.0)
Bicarbonate: 20.7 mEq/L (ref 20.0–24.0)
O2 Saturation: 95 %
TCO2: 22 mmol/L (ref 0–100)
pCO2 arterial: 32.4 mmHg — ABNORMAL LOW (ref 35.0–45.0)
pH, Arterial: 7.414 — ABNORMAL HIGH (ref 7.350–7.400)
pO2, Arterial: 76 mmHg — ABNORMAL LOW (ref 80.0–100.0)

## 2010-07-04 LAB — GLUCOSE, CAPILLARY
Glucose-Capillary: 146 mg/dL — ABNORMAL HIGH (ref 70–99)
Glucose-Capillary: 174 mg/dL — ABNORMAL HIGH (ref 70–99)
Glucose-Capillary: 179 mg/dL — ABNORMAL HIGH (ref 70–99)
Glucose-Capillary: 184 mg/dL — ABNORMAL HIGH (ref 70–99)
Glucose-Capillary: 184 mg/dL — ABNORMAL HIGH (ref 70–99)
Glucose-Capillary: 198 mg/dL — ABNORMAL HIGH (ref 70–99)
Glucose-Capillary: 236 mg/dL — ABNORMAL HIGH (ref 70–99)
Glucose-Capillary: 238 mg/dL — ABNORMAL HIGH (ref 70–99)
Glucose-Capillary: 258 mg/dL — ABNORMAL HIGH (ref 70–99)
Glucose-Capillary: 278 mg/dL — ABNORMAL HIGH (ref 70–99)
Glucose-Capillary: 330 mg/dL — ABNORMAL HIGH (ref 70–99)
Glucose-Capillary: 379 mg/dL — ABNORMAL HIGH (ref 70–99)

## 2010-07-04 LAB — CARDIAC PANEL(CRET KIN+CKTOT+MB+TROPI)
CK, MB: 1.1 ng/mL (ref 0.3–4.0)
CK, MB: 1.3 ng/mL (ref 0.3–4.0)
CK, MB: 1.4 ng/mL (ref 0.3–4.0)
CK, MB: 1.7 ng/mL (ref 0.3–4.0)
Relative Index: INVALID (ref 0.0–2.5)
Relative Index: INVALID (ref 0.0–2.5)
Relative Index: INVALID (ref 0.0–2.5)
Relative Index: INVALID (ref 0.0–2.5)
Total CK: 46 U/L (ref 7–177)
Total CK: 49 U/L (ref 7–177)
Total CK: 50 U/L (ref 7–177)
Total CK: 56 U/L (ref 7–177)
Troponin I: 0.39 ng/mL — ABNORMAL HIGH (ref 0.00–0.06)
Troponin I: 0.49 ng/mL — ABNORMAL HIGH (ref 0.00–0.06)
Troponin I: 0.56 ng/mL (ref 0.00–0.06)
Troponin I: 0.61 ng/mL (ref 0.00–0.06)

## 2010-07-04 LAB — BASIC METABOLIC PANEL
BUN: 10 mg/dL (ref 6–23)
BUN: 12 mg/dL (ref 6–23)
CO2: 23 mEq/L (ref 19–32)
CO2: 24 mEq/L (ref 19–32)
Calcium: 8.4 mg/dL (ref 8.4–10.5)
Calcium: 9.1 mg/dL (ref 8.4–10.5)
Chloride: 103 mEq/L (ref 96–112)
Chloride: 105 mEq/L (ref 96–112)
Creatinine, Ser: 0.71 mg/dL (ref 0.4–1.2)
Creatinine, Ser: 0.72 mg/dL (ref 0.4–1.2)
GFR calc Af Amer: >60 mL/min (ref >60)
GFR calc Af Amer: >60 mL/min (ref >60)
GFR calc non Af Amer: >60 mL/min (ref >60)
GFR calc non Af Amer: >60 mL/min (ref >60)
Glucose, Bld: 200 mg/dL — ABNORMAL HIGH (ref 70–99)
Glucose, Bld: 333 mg/dL — ABNORMAL HIGH (ref 70–99)
Potassium: 3.5 mEq/L (ref 3.5–5.1)
Potassium: 3.7 mEq/L (ref 3.5–5.1)
Sodium: 134 mEq/L — ABNORMAL LOW (ref 135–145)
Sodium: 136 mEq/L (ref 135–145)

## 2010-07-04 LAB — POCT I-STAT 3, VENOUS BLOOD GAS (G3P V)
Acid-base deficit: 2 mmol/L (ref 0.0–2.0)
Acid-base deficit: 3 mmol/L — ABNORMAL HIGH (ref 0.0–2.0)
Acid-base deficit: 4 mmol/L — ABNORMAL HIGH (ref 0.0–2.0)
Bicarbonate: 20.6 mEq/L (ref 20.0–24.0)
Bicarbonate: 21.9 mEq/L (ref 20.0–24.0)
Bicarbonate: 22.3 mEq/L (ref 20.0–24.0)
O2 Saturation: 58 %
O2 Saturation: 60 %
O2 Saturation: 66 %
TCO2: 22 mmol/L (ref 0–100)
TCO2: 23 mmol/L (ref 0–100)
TCO2: 23 mmol/L (ref 0–100)
pCO2, Ven: 35.8 mmHg — ABNORMAL LOW (ref 45.0–50.0)
pCO2, Ven: 37.6 mmHg — ABNORMAL LOW (ref 45.0–50.0)
pCO2, Ven: 37.7 mmHg — ABNORMAL LOW (ref 45.0–50.0)
pH, Ven: 7.368 — ABNORMAL HIGH (ref 7.250–7.300)
pH, Ven: 7.373 — ABNORMAL HIGH (ref 7.250–7.300)
pH, Ven: 7.38 — ABNORMAL HIGH (ref 7.250–7.300)
pO2, Ven: 31 mmHg (ref 30.0–45.0)
pO2, Ven: 32 mmHg (ref 30.0–45.0)
pO2, Ven: 35 mmHg (ref 30.0–45.0)

## 2010-07-04 LAB — DIFFERENTIAL
Basophils Absolute: 0.2 10*3/uL — ABNORMAL HIGH (ref 0.0–0.1)
Basophils Relative: 2 % — ABNORMAL HIGH (ref 0–1)
Eosinophils Absolute: 0.2 10*3/uL (ref 0.0–0.7)
Eosinophils Relative: 1 % (ref 0–5)
Lymphocytes Relative: 27 % (ref 12–46)
Lymphs Abs: 2.9 10*3/uL (ref 0.7–4.0)
Monocytes Absolute: 0.5 10*3/uL (ref 0.1–1.0)
Monocytes Relative: 5 % (ref 3–12)
Neutro Abs: 6.8 10*3/uL (ref 1.7–7.7)
Neutrophils Relative %: 65 % (ref 43–77)

## 2010-07-04 LAB — PROTIME-INR
INR: 1.01 (ref 0.00–1.49)
Prothrombin Time: 13.2 seconds (ref 11.6–15.2)

## 2010-07-04 LAB — CK TOTAL AND CKMB (NOT AT ARMC)
CK, MB: 1.9 ng/mL (ref 0.3–4.0)
Relative Index: INVALID (ref 0.0–2.5)
Total CK: 61 U/L (ref 7–177)

## 2010-07-04 LAB — HEMOGLOBIN A1C
Hgb A1c MFr Bld: 11.7 % — ABNORMAL HIGH (ref 4.6–6.1)
Hgb A1c MFr Bld: 12.3 % — ABNORMAL HIGH (ref 4.6–6.1)
Mean Plasma Glucose: 289 mg/dL
Mean Plasma Glucose: 306 mg/dL

## 2010-07-04 LAB — APTT: aPTT: 30 seconds (ref 24–37)

## 2010-07-04 LAB — POCT CARDIAC MARKERS
CKMB, poc: <1 ng/mL — ABNORMAL LOW (ref 1.0–8.0)
Myoglobin, poc: 44.1 ng/mL (ref 12–200)
Troponin i, poc: 0.09 ng/mL (ref 0.00–0.09)

## 2010-07-04 LAB — HEPARIN LEVEL (UNFRACTIONATED)
Heparin Unfractionated: 0.12 IU/mL — ABNORMAL LOW (ref 0.30–0.70)
Heparin Unfractionated: <0.1 IU/mL — ABNORMAL LOW (ref 0.30–0.70)

## 2010-07-04 LAB — TROPONIN I: Troponin I: 0.33 ng/mL — ABNORMAL HIGH (ref 0.00–0.06)

## 2010-07-04 LAB — TSH: TSH: 1.61 u[IU]/mL (ref 0.350–4.500)

## 2010-07-27 ENCOUNTER — Emergency Department (HOSPITAL_BASED_OUTPATIENT_CLINIC_OR_DEPARTMENT_OTHER)
Admission: EM | Admit: 2010-07-27 | Discharge: 2010-07-27 | Disposition: A | Discharge status: Home / Self Care | Payer: Self-pay | Attending: Emergency Medicine | Admitting: Emergency Medicine

## 2010-07-27 DIAGNOSIS — E119 Type 2 diabetes mellitus without complications: Secondary | ICD-10-CM | POA: Insufficient documentation

## 2010-07-27 DIAGNOSIS — E785 Hyperlipidemia, unspecified: Secondary | ICD-10-CM | POA: Insufficient documentation

## 2010-07-27 DIAGNOSIS — I10 Essential (primary) hypertension: Secondary | ICD-10-CM | POA: Insufficient documentation

## 2010-07-27 DIAGNOSIS — L02219 Cutaneous abscess of trunk, unspecified: Secondary | ICD-10-CM | POA: Insufficient documentation

## 2010-07-27 DIAGNOSIS — B372 Candidiasis of skin and nail: Secondary | ICD-10-CM | POA: Insufficient documentation

## 2010-07-27 DIAGNOSIS — F17200 Nicotine dependence, unspecified, uncomplicated: Secondary | ICD-10-CM | POA: Insufficient documentation

## 2010-08-16 NOTE — Assessment & Plan Note (Signed)
OFFICE VISIT   Park, Elizabeth  DOB:  [birth year]                                        March 14, 2010  CHART #:  [id number]   HISTORY OF PRESENT ILLNESS:  The patient returns to the office today for  late followup status post coronary artery bypass grafting x4 and mitral  valve repair on April 07, 2009.  Her surgical procedure was complicated  late by a superficial sternal wound breakdown that ultimately required  skin graft closure to cover the soft tissues.  All the sternal wires  were removed, but her sternum had already healed and remained intact.  She was last seen here in the office on September 10, 2009.  Since then, the  patient has done extremely well.  She continues to abstain from any  tobacco use.  She is back to normal physical activity without any  problems or limitations.  She is not having any shortness of breath.  She states that the left side of her chest still has some sensitivity  and numbness which is to be expected following left internal mammary  artery harvest and grafting.  There is no sensation of clicking or  motion of the sternum.  She states that she can still kind of feel some  pulling sensation when she takes off her brassiere, but overall she is  getting along well and has no problems or complaints.  She states that  her blood sugars have been under excellent control and her most recent  hemoglobin A1c was reportedly less than 6.  Overall, she seems be  getting along very well.   PHYSICAL EXAMINATION:  Notable for a well-appearing female with blood  pressure 132/86, pulse 84, and oxygen saturation 97% on room air.  Examination of the chest reveals that her surgical scars all healed  including the area where the skin graft was placed superiorly.  The  sternum is stable on palpation.  Breath sounds are clear to auscultation  and symmetrical bilaterally.  Cardiovascular exam is notable for regular  rate and rhythm.  No murmurs,  rubs, or gallops are noted.  Extremities  are warm and well perfused.  There is no lower extremity edema.  The  remainder of her physical exam is noncontributory.   IMPRESSION:  The patient is doing well almost 1 year following her  original coronary artery bypass grafting and mitral valve repair.  Everything is healed up nicely and she seems to be getting along quite  well.   PLAN:  In the future, the patient will call or return to see [place] as  needed.  All of her questions have been addressed.   Salvatore Decent. [name], M.D.  Electronically Signed   CHO/MEDQ  D:  03/14/2010  T:  03/15/2010  Job:  [id number]   cc:   Arturo Morton. Riley Kill, MD, [facility]  Loreta Ave, MD

## 2010-08-16 NOTE — Assessment & Plan Note (Signed)
OFFICE VISIT   [place], Quinetta M  DOB:  05/24/1970                                        July 12, 2009  CHART #:  [id number]   HISTORY:  The patient returns to the office today for 1-week follow up.  Since last week, she reports feeling better.  She still has some pain  radiating to her back.  This pain seems to be less when she turns the  suction on the VAC down to 100 mmHg.  It usually will get worse again  when she turns the suction up.  She has not had any further problems.  She has not had any fevers or chills.  She has been having sweats and  feeling hot at night when she is sleeping.  She has had some mild lower  extremity edema and some mild increase in shortness of breath.  Her  appetite is good.  Her blood sugars have remained under excellent  control, and she reports her highest blood sugar recently is 109.  She  also reports that during the week prior to her visit last week, it was  discovered during one of her visit with home health nurses that there  was a piece of retained sponge that had been in the wound for probably a  good week.  She did not tell us this information last week during her  visit because one of the wraps from the wound VAC device was present at  the time of her visit.  She overall seems better and is feeling a little  better.   PHYSICAL EXAMINATION:  Notable for well-appearing obese female.  Blood  pressure is 119/80, pulse 97, oxygen saturation 98% on room air,  temperature 97.4 degrees Fahrenheit.  Examination of her chest is  notable in that her sternal incision is completely intact with exception  of very small open area inferiorly.  The sternum itself was nontender  and stable, and the incision is completely intact without any  surrounding cellulitis.  The small open wound is now examined.  It  probes to approximately 11 mm in depth.  It is definitely filled in some  in comparison with last week.  There is no purulent  drainage.  There is  no surrounding cellulitis.  No other abnormalities are noted.   DIAGNOSTIC TESTS:  Chest CT scan performed last week is notable for the  absence of any obvious abscess or mediastinitis.  There are inflammatory  changes inferiorly as expected and no other abnormalities are noted.   IMPRESSION:  I am please with how the patient's wound looks today.  It  seems to be granulating in and progressing, and it does not probe as  deep as it did last week.  There is no purulent drainage at all and she  is feeling better on oral antibiotics.  She is having sweats, and she  also reports some lower extremity edema and mild shortness of breath.  She is no longer taking Lasix.   PLAN:  I have given the patient a prescription for Lasix 20 mg daily,  potassium chloride 10 mEq daily.  We will make sure that the home health  care nurses are swabbing her wound with hydrogen peroxide when they  change the wound VAC.  We will see her back next week.  We will send  her  for blood work today to include blood cultures, CBC, BMET, and a BNP  panel.   Salvatore Decent. [name], M.D.  Electronically Signed   CHO/MEDQ  D:  07/12/2009  T:  07/13/2009  Job:  [id number]

## 2010-08-16 NOTE — Assessment & Plan Note (Signed)
OFFICE VISIT   Park, Elizabeth M  DOB:  07/30/1970                                        May 10, 2009  CHART #:  [id number]   HISTORY OF PRESENT ILLNESS:  The patient comes in today for  postoperative follow up and wound check.  She is status post coronary  artery bypass grafting x4 with mitral valve repair on April 07, 2009.  She recently returned to the office and was noted to have some  superficial opening of the lower portion of her sternal wound which  ultimately was probed in the office and packed with 4 x 4 wet-to-dry  dressings.  Since that time, she has returned and was seen by the nurse  as well as Dr. Laneta Simmers on May 04, 2009.  At that time the wound  appears to be granulating fairly well without drainage or erythema.  She  was asked to continue daily dressing changes and return today for  followup.  Since that last visit, she and her sister who is assisting  with her care, both have noted some increase in drainage from the wound  with foul odor and purulent appearance.  The area is mildly tender when  she packs it, but there is no surrounding erythema and generally no  tenderness around the wound.  She denies any fevers or chills.  She has  otherwise been doing very well.  She has an appointment to see her  cardiologist next week.  [facility] has [facility]  and they are having a bit of a hard time regulating her INRs.  Her most  recent one was 1.6 and she currently is alternating with 5 mg and with  7.5 mg of Coumadin every other day.  She has seen her primary care  physician since her last visit and her blood sugars have actually been  very well controlled, running in the 90-120 range.  She states that her  most recent hemoglobin A1c was 8 which is an improvement from her  hospitalization.   PHYSICAL EXAMINATION:  VITAL SIGNS:  Blood pressure is 109/77, pulse is  98, respirations 18, O2 sat 99% on room air.  CHEST:  Her  sternal wound is draining a purulent drainage with a foul  odor.  The area is approximately 1.5 to 2 cm in length and approximately  0.5 cm in depth.  The surrounding area does not appear to be  erythematous and there is no fluctuance surrounding the area.  Her  sternum is stable to palpation.  Chest tube sites are healing with some  areas of scabbing noted.  HEART:  Regular rate and rhythm without murmurs, rubs, or gallops.  LUNGS:  Clear.  EXTREMITIES:  Legs are without edema and EVH site on the right is  healing well.   ASSESSMENT AND PLAN:  The patient is overall doing well status post  complex mitral valve repair with coronary artery bypass grafting.  It  appears that she does have some infection in this opening of her sternal  wound at this time.  I asked Dr. Tyrone Sage to examine the wound with me  and he agrees with my assessment.  We will plan to start her on Keflex  500 mg t.i.d. x10 days and will increase the frequency of her dressing  changes to at  least b.i.d., possibly t.i.d. if she is able.  We will see  her back in 1 week for followup and recheck her wound.  I have  instructed her to call our office immediately if she experiences any  fevers, chills, increased drainage, or erythema around the wound itself.   Salvatore Decent. [name], M.D.  Electronically Signed   GC/MEDQ  D:  05/10/2009  T:  05/11/2009  Job:  [id number]   cc:   Arturo Morton. Riley Kill, MD, [facility]  Tera Mater. Clent Ridges, MD

## 2010-08-16 NOTE — Assessment & Plan Note (Signed)
OFFICE VISIT   [place], Elizabeth M  DOB:  Mar 19, 1971                                        June 08, 2009  CHART #:  [id number]   The patient returns to the office today for further followup of her  superficial sternal wound.  She underwent local debridement and  placement of a wound vacuum-assisted closure device on February 28.  Initially, she did well, but over this past weekend, the home health  nurse who was assigned to her case apparently had problems getting her  wound VAC placed, and ultimately she went for 2 days without a  functioning VAC wound in place.  She came into the office yesterday and  a wet-to-dry dressing was placed to temporize matters, until I could  evaluate her further.  She returns to the office today and states that  otherwise she has been doing fine.  She has not had any fevers or  chills.  She has not had any problems with increasing pain in her chest.  Her blood sugars have been stable and she has not had any hyperglycemia  at all.  She has no other complaints.   On exam, the dressing is removed.  There is some greenish drainage in  the dressing.  After the dressing has been removed, there is some early  granulation tissue on the sides.  On palpation, there is no sternal  instability or tenderness.  There is no drainage elicited on palpation  and the wound does not track in either direction.  Wound VAC is  replaced.  There is some irritation in the skin from the wound VAC; and  because of this, we will cut the suction back to 75 mmHg.  We will also  have the patient restart oral Keflex 500 mg 3 times daily.  A wound swab  culture was sent and we will follow up on the culture results.  We will  see her back in 2 weeks.   Salvatore Decent. [name], M.D.  Electronically Signed   CHO/MEDQ  D:  06/08/2009  T:  06/09/2009  Job:  [id number]

## 2010-08-16 NOTE — Assessment & Plan Note (Signed)
OFFICE VISIT   [place], Alveria M  DOB:  1970-07-17                                        June 21, 2009  CHART #:  [id number]   HISTORY:  The patient returns to the office today for further followup  of her superficial sternal wound.  She was last seen here in the office  on June 08, 2009.  Since then, she has done well.  For a period of time,  she was having trouble with some blistering of the skin and sensitivity  in the area surrounding her wound VAC, but since then, her home health  nurses have been placing DuoDerm over the skin prior to placing the  plastic sealant dressing and this seems to have completely alleviated  this problem.  She has not had any fevers or chills.  She has minimal  soreness in her chest.  Her blood sugars have remained under good  control and she is otherwise doing quite well.   PHYSICAL EXAMINATION:  Her sternum remains completely stable and  nontender.  The small open wound inferiorly has dramatically filled in  and shrunk in size since I last examined it.  There is very healthy,  beefy red granulation tissue on the sides.  There is no purulent  drainage.  The length of the wound now measures approximately 2.3 cm and  it is less than 1 cm in width.  Near the cephalad apex, it still probes  to 2 cm in depth, and it appears as though the wound VAC sponge is not  getting packed all the way deep in this portion.  The wound otherwise  looks fine and the remainder of her physical exam is unrevealing.   Wound cultures from June 08, 2009 grew abundant Pseudomonas aeruginosa,  which was sensitive to ciprofloxacin and resistant to ceftriaxone.  At  that time, the patient was changed to ciprofloxacin and she is soon to  complete a 2-week course.  I see no reason to continue antibiotics any  further at this time.   IMPRESSION:  Excellent progress with small superficial sternal wound  that is filling and granulating nicely.  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   PLAN:  We will instruct home health nurses to begin placing a small  piece of white foam in the depth of this portion of the wound to be  covered by a standard wound VAC sponge above that.  I do not think that  Mepitel should be used any further as any sticking of the sponge at the  time of wound change.  We will further help to debride the depth of this  wound and allow to fill in from the base up.  She will continue with the  DuoDerm to protect the skin.  We will not renew her prescription for  ciprofloxacin after she completes her course unless further problems  arise.  We will plan to see her back in 2 weeks.   Salvatore Decent. [name], M.D.  Electronically Signed   CHO/MEDQ  D:  06/21/2009  T:  06/22/2009  Job:  [id number]

## 2010-08-16 NOTE — Assessment & Plan Note (Signed)
OFFICE VISIT   [place], Elizabeth Park  DOB:  07/15/1970                                        Aug 23, 2009  CHART #:  [id number]   HISTORY:  The patient returns for further follow up of her sternal  wound.  This is her first visit since she was discharged from the  hospital after her most recent sternal wound debridement and ultimately  split-thickness skin graft coverage by Dr. Noelle Penner.  She has done  remarkably well.  She continues to follow up regularly with Dr. Noelle Penner.  Her skin graft was essentially 100% take and she has now virtually  healed up completely.  She is doing extremely well otherwise.  She is  active physically and reports no problems or complaints.  She does not  have any significant pains.  She has no shortness of breath.  She has no  fevers.  Her blood sugars have been under good control.  The remainder  of her review of systems is unremarkable.   PHYSICAL EXAMINATION:  Notable for well-appearing [name] female with  blood pressure 129/94, pulse 77, oxygen saturation 94% on room air.  Her  sternal incision is healing beautifully with 100% take of her skin  graft.  The sternum remains stable and completely nontender.  Auscultation of the chest reveals clear breath sounds.  Cardiovascular  exam is notable for regular rate and rhythm.  No murmurs, rubs, or  gallops noted.  The abdomen is soft, nontender.  The right thigh skin  graft harvest site is healing nicely.  There is no lower extremity  edema.  The remainder of her physical exam is unremarkable.   IMPRESSION:  The patient has done remarkably well.  Her sternal wound  problem has resolved.   PLAN:  We will see the patient back for wound final followup in 6  months.   Salvatore Decent. [name], [place].D.  Electronically Signed   CHO/MEDQ  D:  08/23/2009  T:  08/24/2009  Job:  [id number]

## 2010-08-16 NOTE — Assessment & Plan Note (Signed)
OFFICE VISIT   [place], Elizabeth Park  DOB:  [birth year]                                        July 05, 2009  CHART #:  [id number]   HISTORY:  The patient comes in today for a 1-week followup.  She was  last seen on June 28, 2009 for recheck of her sternal wound.  Since her  last visit, she has continued to have regular VAC changes using the  white foam VAC dressing.  She reports that she is having much less skin  irritation, although she has noted some increased odor to the drainage  around the wound.  She is also having some generalized soreness around  the lateral portion of her chest wall.  She has noticed some red  streaking, which extends just above both the openings.  She denies any  fevers or chills.  She currently is not on any antibiotics.  She is  otherwise doing well.   PHYSICAL EXAMINATION:  Vital Signs:  Blood pressure is 121/89, pulse is  94, respirations 18, and O2 sat 96% on room air.  Wound VAC is removed  and the wounds are evaluated by Dr. [name].  The lower portion of the  wound is probed with a Q-tip and does not appear to track.  The wound  appears to be granulating, although there is some mild erythema between  the openings as well as just adjacent to the uppermost area.  There is  some tenderness to palpation surrounding these areas.   ASSESSMENT/PLAN:  Dr. [name] has instructed the patient to continue with  VAC dressing changes with the white VAC sponge as she is currently  doing.  He is also recommended restarting her antibiotics and we have  given her a prescription for Cipro 500 mg b.i.d. x1 month.  Additionally, since she is having some discomfort, we will have her  obtain a CT of the chest to rule out mediastinitis.  We will see her  back in 1 week for followup.   Salvatore Decent. [name], [place].D.  Electronically Signed   GC/MEDQ  D:  07/05/2009  T:  07/06/2009  Job:  [id number]

## 2010-08-16 NOTE — Assessment & Plan Note (Signed)
OFFICE VISIT   [place], Benny M  DOB:  04-10-1970                                        June 28, 2009  CHART #:  [id number]   The patient returns to the office today for wound check.  She was last  seen here in the office 1 week ago on June 21, 2009.  Since then, she  has continued to do well.  She reports no fevers or chills.  She has no  residual pain in her chest at all.  She otherwise feels fine, and she  reports that her blood sugars remain under fairly good control.  On  physical examination, her wound has been filling and granulating in  nicely.  However, on probing, the deep portion of wound still probes to  2.3 cm in depth and it appears as though the wound VAC sponge is not  getting packed all the way down into the depth as the wound had been  trying to seal over above this.  The wound does not track in the  cephalad direction at all.  There is a small area of skin edge opening  slightly superiorly, but this does not probe deep at all and is very  superficial.  All of it is clean and there is healthy granulation  tissue.  There is no surrounding cellulitis.  We will instruct the home  health care nurses to make sure and pack the white VAC sponge into the  depth of the wound and to place a second white VAC sponge to cover both  open areas.  We will see her back in 1 week for another wound check.  I  see no reason to continue her antibiotics.   Salvatore Decent. [name], M.D.  Electronically Signed   CHO/MEDQ  D:  06/28/2009  T:  06/29/2009  Job:  [id number]

## 2010-08-16 NOTE — Assessment & Plan Note (Signed)
OFFICE VISIT   Elizabeth Park, Elizabeth Park  DOB:  [birth year]                                        May 03, 2009  CHART #:  [id number]   REASON FOR OFFICE VISIT:  Routine follow up status post CABG x4, mitral  valve repair on April 07, 2009.   HISTORY OF PRESENT ILLNESS:  This is a 40 year old Caucasian female with  multiple cardiac risk factors who was found to have severe MR as well as  multivessel coronary artery disease.  She underwent a CABG x4 (LIMA to  LAD, SVG to subbranch of ramus intermediate, SVG to lateral subbranch of  ramus intermediate, SVG to distal RCA with EVH from the right thigh and  lower leg as well as a mitral valve repair (complex valvuloplasty with  triangular plication of anterior leaflet using a 26-mm Derl Barrow IMR ring annuloplasty by Dr. [name] on April 07, 2009)).  Since  having been discharged from the hospital, the patient denies shortness  of breath or chest pain, fever or chills; however, she does have  occasional incisional pain, as well as a partial opening of the lower  portion of the sternal wound, the patient also has complaints of some  numbness medial side of her left breast as well as some difficulty  sleeping.  She has been seen by Dr. Clent Ridges who has made adjustments in her  insulin accordingly.  She has not yet seen Dr. Riley Kill in follow up;  however, she does have an appointment to see him in 1 week from  tomorrow.   PHYSICAL EXAMINATION:  General:  This is a pleasant 40 year old  Caucasian female who is in no acute distress who is alert, oriented, and  cooperative.  Vital signs:  BP 104/76, heart rate 96, respiration rate  18, O2 sat is 99% on room air.  Cardiovascular:  Regular rate and  rhythm.  Pulmonary:  Clear to auscultation bilaterally.  No rales,  wheezes, or rhonchi.  Abdomen:  Soft, nontender.  Bowel sounds present.  Extremities:  Trace bilateral lower extremity edema.  There is a smaller  portion of  the wound inferior to the area that was packed that had some  minor fluffy tissue superficial.   WOUNDS:  Right lower extremity wound is clean, dry, and well healed.  The sternal wound in the inferior portion with fluffy tissue.  No real  erythema or purulence noted.  No sternal instability.  After the wound  was probed with a Q-tip.  There were several sutures seen, these were  removed.  There was some granulation present.  The wound depth was  approximately that a little bit beyond the head of the Q-tip.  Chest x-  ray done showed no pleural effusions, no pneumothorax, mild  cardiomegaly.   IMPRESSION AND PLAN:  1. Regarding the patient's lower sternal wound, Dr. Dorris Fetch was      asked to evaluate the wound.  A couple of sutures were removed.      The wound was then packed with a damp 4 x 4 (with normal saline      with dry 4 x 4's on top as well as tape).  Home Health is going to      be arranged to change this dressing daily.  The patient was      instructed  that if she develops any fever, chills, redness,      drainage, or increased tenderness she is to contact the office      immediately.  2. The patient was instructed continue with sternal precautions (no      lifting more than 10-15 pounds until instructed otherwise).  3. She should continue ambulating every day and increase her frequency      and duration as tolerates.  She should not begin cardiac rehab      until her sternal wound is better healed.  4. The patient is still requiring narcotics (which she mostly takes      only at night).  She asked for a refill which will be called into      her [facility].  She does have a stick shift vehicle and was      encouraged not to drive yet.  No changes have been made to her      discharge medications.  She is still currently taking Lasix and      potassium, and I instructed her to continue this until she is seen      by Dr. Riley Kill.  In addition, she does have a PT/INR  appointment on      May 04, 2009.  Her Coumadin is being adjusted by Dr. Rosalyn Charters      office.  Finally, the patient is going to follow up with [place] in 1      week for further sternal wound evaluation.  She was instructed to      call for any problems, questions, or concerns.   Salvatore Decent. [name], Park.D.  Electronically Signed   DZ/MEDQ  D:  05/03/2009  T:  05/04/2009  Job:  [id number]   cc:   Arturo Morton. Riley Kill, MD, [facility]  Tera Mater. Clent Ridges, MD

## 2010-08-16 NOTE — Assessment & Plan Note (Signed)
OFFICE VISIT   Park, Elizabeth M  DOB:  12/27/1970                                        May 24, 2009  CHART #:  [id number]   HISTORY OF PRESENT ILLNESS:  The patient returns for further followup  status post coronary artery bypass grafting x4 and mitral valve repair  on April 07, 2009.  She developed superficial sternal wound infection  involving the inferior aspect of her sternal wound for which she has  been seen over the last several weeks by one of the physician assistants  here in our office.  She states that over the last week, she has  continued to do fairly well, although she admits that she tried driving  her car the other day and she developed some pain in her chest wall, she  was driving, that was pleuritic in nature and seemed to be exacerbated  by motion of her right shoulder.  She has not had any fevers or chills.  She has not had any shortness of breath.  She completed her course of  oral antibiotics and overall she is feeling well.  By report, local  wound care for her sternal wound has continued to do fairly well in  addition, she has not had any further problems.  The remainder of her  review of systems is unremarkable.   PHYSICAL EXAMINATION:  Notable for a well-appearing obese female with  blood pressure 118/85, pulse 88, temperature 98.5, and oxygen saturation  98% on room air.  Examination of the chest reveals her median sternotomy  incision is intact and healing nicely with exception of the open wound  inferiorly.  This wound now measures 1 cm in width and 2 cm in length.  It probes to 2 cm depth.  The wound is clean.  There is some early  granulation tissue.  There does not appear to be any necrotic tissue or  purulent drainage.  There is no surrounding cellulitis.  The sternum is  otherwise stable.  Auscultation of chest reveals clear breath sounds.  Cardiovascular exam is notable for regular rate and rhythm.  No murmurs,  rubs, or gallops are noted.  The extremities are warm and well perfused.   IMPRESSION:  Small superficial sternal wound breakdown inferiorly with  resolution of previous cellulitis and sternal wound infection.  However,  despite the fact that the wound looks clean, it is starting to heal, and  it is quite deepened, I am concerned that with measures as outlined  previously this would take months to heal completely and could backtrack  at any point in time and wind up becoming a bigger problem than it is at  this time.   PLAN:  I have discussed the rationale for placement of a wound VAC with  the patient here in the office today.  We will make arrangements for  home health therapy to come out and place the [facility] as soon as practical.  We will obtain the chest CT scan to make sure there is not any undrained  fluid collection in the mediastinum.  All of her questions have been  addressed.  We will plan to see her back in office in 2 weeks.   Salvatore Decent. [name], M.D.  Electronically Signed   CHO/MEDQ  D:  05/24/2009  T:  05/25/2009  Job:  [id number] 

## 2010-08-16 NOTE — Assessment & Plan Note (Signed)
OFFICE VISIT   [place], Elizabeth Park  DOB:  1970-04-28                                        September 10, 2009  CHART #:  [id number]   REASON FOR OFFICE VISIT:  Checks sternal wound.   HISTORY OF PRESENT ILLNESS:  This is a pleasant 40 year old Caucasian  female who was originally status post CABG x4 and a mitral valve repair  by Dr. [name] on April 07, 2009.  The patient subsequently had a  nonhealing sternal wound infection.  She underwent sternal wound  debridement with placement of wound VAC on May 31, 2009 as well as  July 15, 2009 and July 19, 2009.  She also ultimately underwent a  split-thickness skin graft by Dr. Noelle Penner.  Yesterday, the patient became  concerned because she noticed a white substance clicking at the inferior  area of her sternal wound.  She denied any purulence, fever, or chills.  She does not have any chest pain or shortness of breath.  She does note  occasional discomfort to the left of her sternal wound, especially she  sleeps on her left side, but this has been ongoing for the last couple  of months.   PHYSICAL EXAMINATION:  General:  This is a pleasant 40 year old  Caucasian female, who is in no acute distress, who is alert, oriented,  and cooperative.  Vital Signs:  Latest vital signs are as follows.  BP  129/84, heart rate 88, respiration 18, and O2 sat 97% on room air.  Cardiovascular:  Regular rate and rhythm.  No murmurs, gallops, or rubs.  Pulmonary:  Clear to auscultation bilaterally.  No rales, wheezes, or  rhonchi.  Sternal wound is clean and dry.  No purulence, drainage, or  cellulitis noted.  The patient does have a tiny area on the inferior  sternal skin graft edge that is slightly redder than the rest of the  skin.  However, again no areas of dehiscence or drainage.   IMPRESSION AND PLAN:  The patient at this time has no cellulitis or  evidence of the sternal wound infection.  The patient states that she  was  instructed to put a lotion on her sternal wound per Dr. Noelle Penner and  she believes that what happened was a lot of this lotion collected on  the inferior aspect of the wound.  After she had taken a shower, she  notes that there was no frank drainage and again she denies any fever,  chills, or sternal pain.  The patient has an appointment to see Dr.  Noelle Penner on October 07, 2009.  She does inquire as to whether or not she has  to wear a bra to bed every evening.  She was instructed to contact his  office for further instructions regarding this.  She also has a followup  appointment to see Dr. [name] in a couple of months.  She is to contact  our office if she develops any drainage, redness, fever, or chills.   Salvatore Decent. [name], Park.D.  Electronically Signed   DZ/MEDQ  D:  09/10/2009  T:  09/10/2009  Job:  [id number]   cc:   Loreta Ave, MD  Arturo Morton. Riley Kill, MD, [facility]

## 2010-08-16 NOTE — Assessment & Plan Note (Signed)
OFFICE VISIT   [name], Elizabeth Park  DOB:  29-Jul-1970                                        July 14, 2009  CHART #:  [id number]   The patient returns to the office today for followup related to a  sternal wound.  She was just seen in the office 2 days ago.  Since then,  she has developed increased pain and overnight last night, she developed  swelling along her anterior chest.  She has not had any fevers.  She has  been nauseated and had some emesis this morning.  She has not had much  of anything to eat.  Blood work obtained demonstrates a white blood  count of 21,700.  She has a new area of tenderness anteriorly consistent  with likely deep sternal wound infection.  We will need to admit her  directly to the hospital and proceed with sternal wound debridement.   Salvatore Decent. [name], Park.D.  Electronically Signed   CHO/MEDQ  D:  07/14/2009  T:  07/15/2009  Job:  [id number]

## 2010-08-16 NOTE — Assessment & Plan Note (Signed)
OFFICE VISIT   Elizabeth Park, Elizabeth Park  DOB:  Sep 29, 1970                                        May 17, 2009  CHART #:  [id number]   REASON FOR OFFICE VISIT:  Sternal wound check.   HISTORY OF PRESENT ILLNESS:  This is a 40 year old Caucasian female who  is status post CABG x4 and mitral valve repair on April 07, 2009.  She  has been followed over the last couple of weeks regarding a superficial  opening of the lower portion of her sternal wound.  Her last visit here  was on May 10, 2009 and at that time she had complaints of some  increased drainage as well as foul odor and purulent appearance.  There  was no surrounding erythema and she denied any fever or chills.  The  patient was given a prescription for Keflex 500 mg p.o. 3 times daily  for 10 days and to continue dressing changes 2 times daily.  Wound  culture that was obtained showed abundant E. coli.  She was instructed  then to stop her Keflex and was started on Cipro 500 mg p.o. 2 times  daily for 10 days.  Currently, the patient states it is slowly becoming  less tender.  She notes barely any drainage.  No redness.  No fever or  chills.   PHYSICAL EXAMINATION:  General:  This is a pleasant 40 year old  Caucasian female who is in no acute distress who is alert, oriented, and  cooperative.  Vital Signs:  Her BP is 118/84, pulse rate 92,  respirations 18, and O2 sat 98% on room air.  Chest:  There is  granulation present.  There is slight fluffy yellow tissue present on  the lower portion of the wound, this was debrided.  There were also 3  sutures which were not intact, that were removed.  There is no erythema  of the surrounding tissue.  There is no obvious sternal instability.  Her chest tube sites are well healed with no further scabbing.  Extremities:  Her right lower extremity wounds are clean, dry, and well  healed.  No cellulitis and no signs of infection.  Also, she has no  lower  extremity edema.   IMPRESSION AND PLAN:  The patient's sternal wound continued to be  healing.  She was instructed to continue with her Cipro and finish the  entire course of the antibiotics.  She is to continue with her dressing  changes b.i.d.  She is going to follow up with Dr. [name] in 1 week for  another wound check.  She was instructed to call the office immediately  if she has any fever, chills, increased drainage, or erythema.  In  addition, it should be noted the patient has recently seen Dr. Riley Kill  in followup.  She will continue to be followed by his office closely as  they are also regulating her Coumadin.  Her medical doctor is following  her blood sugars, which according to the patient had been running into  the 90s to 120s.  The patient was also instructed that she is to finish  the Lasix and did not require a refill.  She was instructed to keep  daily weight and if she notices her lower extremities beginning to swell  and/or she develops shortness of breath, she  is to contact our office  immediately.  Also, she was instructed she may begin driving short  distances during the day provided she is not on narcotics.  She is to  continue with her sternal precautions (i.e. no lifting more than 10  pounds and she is encouraged to continue ambulating several times daily.   Salvatore Decent. [name], Park.D.  Electronically Signed   DZ/MEDQ  D:  05/17/2009  T:  05/18/2009  Job:  [id number]   cc:   Arturo Morton. Riley Kill, MD, [facility]

## 2010-08-25 ENCOUNTER — Emergency Department (HOSPITAL_BASED_OUTPATIENT_CLINIC_OR_DEPARTMENT_OTHER)
Admission: EM | Admit: 2010-08-25 | Discharge: 2010-08-25 | Disposition: A | Discharge status: Home / Self Care | Payer: Self-pay | Attending: Emergency Medicine | Admitting: Emergency Medicine

## 2010-08-25 DIAGNOSIS — IMO0002 Reserved for concepts with insufficient information to code with codable children: Secondary | ICD-10-CM | POA: Insufficient documentation

## 2010-08-25 DIAGNOSIS — E785 Hyperlipidemia, unspecified: Secondary | ICD-10-CM | POA: Insufficient documentation

## 2010-08-25 DIAGNOSIS — L02219 Cutaneous abscess of trunk, unspecified: Secondary | ICD-10-CM | POA: Insufficient documentation

## 2010-08-25 DIAGNOSIS — E119 Type 2 diabetes mellitus without complications: Secondary | ICD-10-CM | POA: Insufficient documentation

## 2010-08-25 DIAGNOSIS — I10 Essential (primary) hypertension: Secondary | ICD-10-CM | POA: Insufficient documentation

## 2010-08-25 DIAGNOSIS — F17200 Nicotine dependence, unspecified, uncomplicated: Secondary | ICD-10-CM | POA: Insufficient documentation

## 2010-08-25 DIAGNOSIS — L02419 Cutaneous abscess of limb, unspecified: Secondary | ICD-10-CM | POA: Insufficient documentation

## 2010-08-25 DIAGNOSIS — Z79899 Other long term (current) drug therapy: Secondary | ICD-10-CM | POA: Insufficient documentation

## 2011-05-02 ENCOUNTER — Encounter (HOSPITAL_BASED_OUTPATIENT_CLINIC_OR_DEPARTMENT_OTHER): Payer: Self-pay | Admitting: *Deleted

## 2011-05-02 ENCOUNTER — Emergency Department (HOSPITAL_BASED_OUTPATIENT_CLINIC_OR_DEPARTMENT_OTHER)
Admission: EM | Admit: 2011-05-02 | Discharge: 2011-05-02 | Disposition: A | Discharge status: Home / Self Care | Payer: Self-pay | Attending: Emergency Medicine | Admitting: Emergency Medicine

## 2011-05-02 DIAGNOSIS — L02818 Cutaneous abscess of other sites: Secondary | ICD-10-CM | POA: Insufficient documentation

## 2011-05-02 DIAGNOSIS — I252 Old myocardial infarction: Secondary | ICD-10-CM | POA: Insufficient documentation

## 2011-05-02 DIAGNOSIS — E119 Type 2 diabetes mellitus without complications: Secondary | ICD-10-CM | POA: Insufficient documentation

## 2011-05-02 DIAGNOSIS — E7800 Pure hypercholesterolemia, unspecified: Secondary | ICD-10-CM | POA: Insufficient documentation

## 2011-05-02 DIAGNOSIS — L089 Local infection of the skin and subcutaneous tissue, unspecified: Secondary | ICD-10-CM | POA: Insufficient documentation

## 2011-05-02 HISTORY — DX: Pure hypercholesterolemia, unspecified: E78.00

## 2011-05-02 HISTORY — DX: Methicillin resistant Staphylococcus aureus infection, unspecified site: A49.02

## 2011-05-02 MED ORDER — DOXYCYCLINE HYCLATE 100 MG PO CAPS: 100.0000 mg | ORAL_CAPSULE | Freq: Two times a day (BID) | ORAL | Status: AC

## 2011-05-02 MED ORDER — HYDROCODONE-ACETAMINOPHEN 5-325 MG PO TABS: 2.0000 | ORAL_TABLET | ORAL | Status: AC | PRN

## 2011-05-02 MED ORDER — SULFAMETHOXAZOLE-TRIMETHOPRIM 800-160 MG PO TABS: 1.0000 | ORAL_TABLET | Freq: Two times a day (BID) | ORAL | Status: AC

## 2011-05-02 NOTE — ED Notes (Signed)
Recurrent boil to left breast since last august. ?MRSA. Pt has had multiple areas. Pt concerned for large abscess to left breast, and scatterred spots to stomch, arms and inner thigh.

## 2011-05-02 NOTE — ED Provider Notes (Signed)
Medical screening examination/treatment/procedure(s) were performed by non-physician practitioner and as supervising physician I was immediately available for consultation/collaboration.   Jori Frerichs A. Patrica Duel, MD 05/02/11 [phone]

## 2011-05-02 NOTE — ED Provider Notes (Signed)
History     CSN: [id number]  Arrival date & time 05/02/11  2020   First MD Initiated Contact with Patient 05/02/11 2116      Chief Complaint  Patient presents with  . Abscess    (Consider location/radiation/quality/duration/timing/severity/associated sxs/prior treatment) Patient is a 41 y.o. female presenting with abscess. The history is provided by the patient. No language interpreter was used.  Abscess  This is a new problem. The current episode started more than one week ago. The onset was gradual. The problem occurs continuously. The problem has been gradually worsening. The abscess is present on the abdomen, right upper leg, right lower leg, trunk, left lower leg and left upper leg. The problem is moderate.  Pt reports she has Mrsa since 2010.  Pt has abscesses all over body.  Pt reports largest one on her breast has drained  Past Medical History  Diagnosis Date  . Diabetes mellitus   . High cholesterol   . MI (myocardial infarction)   . MRSA (methicillin resistant Staphylococcus aureus)     Past Surgical History  Procedure Date  . Coronary artery bypass graft with valve repair  . Abdominal hysterectomy     No family history on file.  History  Substance Use Topics  . Smoking status: Former Games developer  . Smokeless tobacco: Not on file  . Alcohol Use: No    OB History    Grav Para Term Preterm Abortions TAB SAB Ect Mult Living                  Review of Systems  Skin: Positive for rash and wound.  All other systems reviewed and are negative.    Allergies  Metformin  Home Medications   Current Outpatient Rx  Name Route Sig Dispense Refill  . ASPIRIN 325 MG PO TABS Oral Take 325 mg by mouth daily.    . INSULIN ISOPHANE HUMAN 100 UNIT/ML [place] SUSP Subcutaneous Inject 15 Units into the skin at bedtime.    . INSULIN REGULAR HUMAN 100 UNIT/ML [place] SOLN Subcutaneous Inject 10 Units into the skin daily.    Marland Kitchen METOPROLOL TARTRATE 25 MG PO TABS Oral Take 12.5 mg by  mouth 2 (two) times daily.    Marland Kitchen ROSUVASTATIN CALCIUM 40 MG PO TABS Oral Take 40 mg by mouth daily.      BP 158/108  Pulse 100  Temp(Src) 98.3 F (36.8 C) (Oral)  Resp 18  Ht 5\' 4"  (1.626 m)  Wt 200 lb (90.719 kg)  BMI 34.33 kg/m2  SpO2 98%  Physical Exam  Constitutional: She appears well-developed and well-nourished.  HENT:  Head: Normocephalic.  Cardiovascular: Normal rate.   Pulmonary/Chest: Effort normal.  Abdominal: Soft.  Musculoskeletal: Normal range of motion.  Neurological: She is alert.  Skin: Rash noted. There is erythema.       Pt has abscess all over body in multiple states of healing,   Psychiatric: She has a normal mood and affect.    ED Course  Procedures (including critical care time)  Labs Reviewed - No data to display No results found.   No diagnosis found.    MDM  I will treat with doxy, bactrim and pain medication.  Pt given number for medicine clinic to see infectious disease.       Langston Masker, [place] 05/02/11 2135

## 2011-10-05 ENCOUNTER — Encounter (HOSPITAL_BASED_OUTPATIENT_CLINIC_OR_DEPARTMENT_OTHER): Payer: Self-pay | Admitting: *Deleted

## 2011-10-05 ENCOUNTER — Emergency Department (HOSPITAL_BASED_OUTPATIENT_CLINIC_OR_DEPARTMENT_OTHER)
Admission: EM | Admit: 2011-10-05 | Discharge: 2011-10-05 | Disposition: A | Discharge status: Home / Self Care | Payer: Medicaid Other | Attending: Emergency Medicine | Admitting: Emergency Medicine

## 2011-10-05 DIAGNOSIS — Z8614 Personal history of Methicillin resistant Staphylococcus aureus infection: Secondary | ICD-10-CM | POA: Insufficient documentation

## 2011-10-05 DIAGNOSIS — H60399 Other infective otitis externa, unspecified ear: Secondary | ICD-10-CM | POA: Insufficient documentation

## 2011-10-05 DIAGNOSIS — Z79899 Other long term (current) drug therapy: Secondary | ICD-10-CM | POA: Insufficient documentation

## 2011-10-05 DIAGNOSIS — E7800 Pure hypercholesterolemia, unspecified: Secondary | ICD-10-CM | POA: Insufficient documentation

## 2011-10-05 DIAGNOSIS — Z794 Long term (current) use of insulin: Secondary | ICD-10-CM | POA: Insufficient documentation

## 2011-10-05 DIAGNOSIS — H6092 Unspecified otitis externa, left ear: Secondary | ICD-10-CM

## 2011-10-05 DIAGNOSIS — Z951 Presence of aortocoronary bypass graft: Secondary | ICD-10-CM | POA: Insufficient documentation

## 2011-10-05 DIAGNOSIS — E119 Type 2 diabetes mellitus without complications: Secondary | ICD-10-CM | POA: Insufficient documentation

## 2011-10-05 DIAGNOSIS — I252 Old myocardial infarction: Secondary | ICD-10-CM | POA: Insufficient documentation

## 2011-10-05 DIAGNOSIS — Z87891 Personal history of nicotine dependence: Secondary | ICD-10-CM | POA: Insufficient documentation

## 2011-10-05 MED ORDER — HYDROCODONE-ACETAMINOPHEN 5-325 MG PO TABS: 2.0000 | ORAL_TABLET | ORAL | Status: AC | PRN

## 2011-10-05 MED ORDER — NEOMYCIN-POLYMYXIN-HC 3.5-10000-1 OT SUSP: 4.0000 [drp] | Freq: Three times a day (TID) | OTIC | Status: AC

## 2011-10-05 MED ORDER — CIPROFLOXACIN HCL 500 MG PO TABS: 500.0000 mg | ORAL_TABLET | Freq: Two times a day (BID) | ORAL | Status: AC

## 2011-10-05 NOTE — ED Notes (Signed)
Left ear pain x 1 week. While sitting on the couch tonight she had sudden onset of bleeding from her ear.

## 2011-10-05 NOTE — ED Provider Notes (Signed)
Medical screening examination/treatment/procedure(s) were performed by non-physician practitioner and as supervising physician I was immediately available for consultation/collaboration.   Forbes Cellar, MD 10/05/11 2117

## 2011-10-05 NOTE — ED Provider Notes (Signed)
History     CSN: [id number]  Arrival date & time 10/05/11  2006   First MD Initiated Contact with Patient 10/05/11 2048      Chief Complaint  Patient presents with  . Otalgia    (Consider location/radiation/quality/duration/timing/severity/associated sxs/prior treatment) Patient is a 41 y.o. female presenting with ear pain. The history is provided by the patient. No language interpreter was used.  Otalgia This is a new problem. The current episode started more than 1 week ago. There is pain in the left ear. The problem occurs constantly. The problem has not changed since onset.There has been no fever. The pain is at a severity of 7/10. The pain is moderate. Associated symptoms include ear discharge. Her past medical history is significant for chronic ear infection.    Past Medical History  Diagnosis Date  . Diabetes mellitus   . High cholesterol   . MI (myocardial infarction)   . MRSA (methicillin resistant Staphylococcus aureus)     Past Surgical History  Procedure Date  . Coronary artery bypass graft with valve repair  . Abdominal hysterectomy     No family history on file.  History  Substance Use Topics  . Smoking status: Former Games developer  . Smokeless tobacco: Not on file  . Alcohol Use: No    OB History    Grav Para Term Preterm Abortions TAB SAB Ect Mult Living                  Review of Systems  HENT: Positive for ear pain and ear discharge.   All other systems reviewed and are negative.    Allergies  Metformin  Home Medications   Current Outpatient Rx  Name Route Sig Dispense Refill  . ASPIRIN 325 MG PO TABS Oral Take 325 mg by mouth daily.    . ASPIRIN-ACETAMINOPHEN-CAFFEINE 250-250-65 MG PO TABS Oral Take 2 tablets by mouth every 6 (six) hours as needed. For headache    . DIPHENHYDRAMINE-APAP (SLEEP) 25-500 MG PO TABS Oral Take 2 tablets by mouth at bedtime as needed. For pain    . INSULIN ASPART 100 UNIT/ML [place] SOLN Subcutaneous Inject 10-12 Units  into the skin 2 (two) times daily. 10 units at breakfast and 12 units at dinner    . INSULIN GLARGINE 100 UNIT/ML [place] SOLN Subcutaneous Inject 28 Units into the skin at bedtime.    Marland Kitchen LISINOPRIL 10 MG PO TABS Oral Take 10 mg by mouth daily.    Marland Kitchen METOPROLOL TARTRATE 25 MG PO TABS Oral Take 12.5 mg by mouth 2 (two) times daily.    Marland Kitchen PREGABALIN 25 MG PO CAPS Oral Take 25 mg by mouth 2 (two) times daily.    Marland Kitchen SIMVASTATIN 40 MG PO TABS Oral Take 40 mg by mouth at bedtime.      BP 157/85  Pulse 69  Temp 97.8 F (36.6 C) (Oral)  Resp 18  SpO2 100%  Physical Exam  Nursing note and vitals reviewed. Constitutional: She appears well-developed and well-nourished.  HENT:  Head: Normocephalic and atraumatic.  Nose: Nose normal.  Mouth/Throat: Oropharynx is clear and moist.       Bulging left tm,  Drainage in canal, dried blood  Eyes: EOM are normal. Pupils are equal, round, and reactive to light.  Neck: Normal range of motion. Neck supple.  Musculoskeletal: Normal range of motion.  Neurological: She is alert. She has normal reflexes.  Skin: Skin is warm.  Psychiatric: She has a normal mood and affect.  ED Course  Procedures (including critical care time)  Labs Reviewed - No data to display No results found.   1. External otitis of left ear       MDM   Pt given rx for cortisporin, cipro and hydrocodone for pain.   Pt advised to see her Md for recheck.       Lonia Skinner [place], [place] 10/05/11 2110

## 2011-10-09 ENCOUNTER — Ambulatory Visit (INDEPENDENT_AMBULATORY_CARE_PROVIDER_SITE_OTHER): Payer: Self-pay | Admitting: General Surgery

## 2011-10-10 ENCOUNTER — Ambulatory Visit: Payer: Self-pay | Admitting: Cardiology

## 2011-10-17 ENCOUNTER — Encounter: Payer: Self-pay | Admitting: Obstetrics and Gynecology

## 2011-11-02 ENCOUNTER — Ambulatory Visit (INDEPENDENT_AMBULATORY_CARE_PROVIDER_SITE_OTHER): Payer: Medicaid Other | Admitting: Cardiology

## 2011-11-02 ENCOUNTER — Encounter: Payer: Self-pay | Admitting: Cardiology

## 2011-11-02 VITALS — BP 112/78 | HR 65 | Ht 64.0 in | Wt 212.8 lb

## 2011-11-02 DIAGNOSIS — E785 Hyperlipidemia, unspecified: Secondary | ICD-10-CM

## 2011-11-02 DIAGNOSIS — I2510 Atherosclerotic heart disease of native coronary artery without angina pectoris: Secondary | ICD-10-CM

## 2011-11-02 DIAGNOSIS — E119 Type 2 diabetes mellitus without complications: Secondary | ICD-10-CM

## 2011-11-02 DIAGNOSIS — Z9889 Other specified postprocedural states: Secondary | ICD-10-CM

## 2011-11-02 DIAGNOSIS — I080 Rheumatic disorders of both mitral and aortic valves: Secondary | ICD-10-CM

## 2011-11-02 DIAGNOSIS — Z87891 Personal history of nicotine dependence: Secondary | ICD-10-CM

## 2011-11-02 NOTE — Progress Notes (Signed)
   HPI:  The patient is seen today in followup. I have not seen her in some time. She was sent back by her primary care Dr., and she has not been seen really since the time of her surgery. She underwent revascularization surgery with mitral valve repair. She says that she's been doing great she has not been smoking. There've been no new cardiac symptoms since the time of her surgery. She has had problems with MRSA  Current Outpatient Prescriptions  Medication Sig Dispense Refill  . aspirin 81 MG tablet Take 1 tablet (81 mg total) by mouth daily.      Marland Kitchen doxycycline (DORYX) 100 MG DR capsule Take 100 mg by mouth 2 (two) times daily.      . insulin aspart (NOVOLOG FLEXPEN) 100 UNIT/ML injection Inject 10-12 Units into the skin 2 (two) times daily. 10 units at breakfast and 12 units at dinner      . insulin glargine (LANTUS SOLOSTAR) 100 UNIT/ML injection Inject 28 Units into the skin at bedtime.      Marland Kitchen lisinopril (PRINIVIL,ZESTRIL) 10 MG tablet Take 10 mg by mouth daily.      . metoprolol tartrate (LOPRESSOR) 25 MG tablet Take 12.5 mg by mouth 2 (two) times daily.      . metroNIDAZOLE (METROGEL) 1 % gel Apply 1 application topically 2 (two) times daily.      . pregabalin (LYRICA) 25 MG capsule Take 150 mg by mouth 2 (two) times daily.       . simvastatin (ZOCOR) 40 MG tablet Take 40 mg by mouth at bedtime.        Allergies  Allergen Reactions  . Metformin Diarrhea and Nausea And Vomiting    Past Medical History  Diagnosis Date  . Diabetes mellitus   . High cholesterol   . MI (myocardial infarction)   . MRSA (methicillin resistant Staphylococcus aureus)     Past Surgical History  Procedure Date  . Coronary artery bypass graft with valve repair  . Abdominal hysterectomy     No family history on file.  History   Social History  . Marital Status: Divorced    Spouse Name: N/A    Number of Children: N/A  . Years of Education: N/A   Occupational History  . Not on file.   Social  History Main Topics  . Smoking status: Former Games developer  . Smokeless tobacco: Never Used  . Alcohol Use: No  . Drug Use: No  . Sexually Active: Not Currently    Birth Control/ Protection: None   Other Topics Concern  . Not on file   Social History Narrative  . No narrative on file    ROS: Please see the HPI.  All other systems reviewed and negative.  PHYSICAL EXAM:  BP 112/78  Pulse 65  Ht 5\' 4"  (1.626 m)  Wt 212 lb 12.8 oz (96.525 kg)  BMI 36.53 kg/m2  General: Well developed, modestly overweight, in no acute distress. Head:  Normocephalic and atraumatic. Neck: no JVD Lungs: Clear to auscultation and percussion. Heart: Normal S1 and S2.  No murmur, rubs or gallops. Has pendulous breasts but careful exam done with nursing assistance.   Pulses: Pulses normal in all 4 extremities. Extremities: No clubbing or cyanosis. No edema. Neurologic: Alert and oriented x 3.  EKG:  NSR.  Delay in R wave progression.  No acute changes.    ASSESSMENT AND PLAN:

## 2011-11-02 NOTE — Patient Instructions (Addendum)
Your physician has recommended you make the following change in your medication: DECREASE Aspirin to 81mg  take one by mouth daily  Your physician has requested that you have an echocardiogram. Echocardiography is a painless test that uses sound waves to create images of your heart. It provides your doctor with information about the size and shape of your heart and how well your heart's chambers and valves are working. This procedure takes approximately one hour. There are no restrictions for this procedure.  Your physician wants you to follow-up in: 6 MONTHS.  You will receive a reminder letter in the mail two months in advance. If you don't receive a letter, please call our office to schedule the follow-up appointment.

## 2011-11-03 ENCOUNTER — Encounter: Payer: Self-pay | Admitting: Obstetrics and Gynecology

## 2011-11-03 ENCOUNTER — Ambulatory Visit (INDEPENDENT_AMBULATORY_CARE_PROVIDER_SITE_OTHER): Payer: Medicaid Other | Admitting: Obstetrics and Gynecology

## 2011-11-03 VITALS — BP 102/60 | HR 66 | Ht 64.0 in | Wt 212.0 lb

## 2011-11-03 DIAGNOSIS — R102 Pelvic and perineal pain: Secondary | ICD-10-CM

## 2011-11-03 DIAGNOSIS — Z124 Encounter for screening for malignant neoplasm of cervix: Secondary | ICD-10-CM

## 2011-11-03 DIAGNOSIS — Z0000 Encounter for general adult medical examination without abnormal findings: Secondary | ICD-10-CM

## 2011-11-03 DIAGNOSIS — N930 Postcoital and contact bleeding: Secondary | ICD-10-CM

## 2011-11-03 DIAGNOSIS — N949 Unspecified condition associated with female genital organs and menstrual cycle: Secondary | ICD-10-CM

## 2011-11-03 NOTE — Progress Notes (Signed)
Last Pap: 2009 per pt WNL: Yes Regular Periods:no Contraception: partial hysterectomy  Monthly Breast exam:yes Tetanus<10yrs:yes Nl.Bladder Function:yes Daily BMs:yes Healthy Diet:yes Calcium:no Mammogram:yes Date of Mammogram: 2009 wnl Exercise:yes, walking Have often Exercise: Seatbelt: yes Abuse at home: no Stressful work:no Sigmoid-colonoscopy: n/a Bone Density: No PCP: Dr. Clyda Greener Change in PMH: DM, H/o MI PSURHX;  Quadruple heart bypass, abdominal hysterectomy Change in FMH:n/a Pt c/o bleeding and pain after intercourse.  Pt has no pain .  She sometimes is dry Physical Examination: General appearance - alert, well appearing, and in no distress Mental status - normal mood, behavior, speech, dress, motor activity, and thought processes Neck - supple, no significant adenopathy, thyroid exam: thyroid is normal in size without nodules or tenderness Chest - clear to auscultation, no wheezes, rales or rhonchi, symmetric air entry Heart - normal rate and regular rhythm Abdomen - soft, nontender, nondistended, no masses or organomegaly Breasts - breasts appear normal, no suspicious masses, no skin or nipple changes or axillary nodes Pelvic - normal external genitalia, vulva, vagina,and adnexa.  Uterus and cervix are absent Rectal - normal rectal, no masses, rectal exam not indicated Back exam - full range of motion, no tenderness, palpable spasm or pain on motion Neurological - alert, oriented, normal speech, no focal findings or movement disorder noted Musculoskeletal - no joint tenderness, deformity or swelling Extremities - no edema, redness or tenderness in the calves or thighs Skin - normal coloration and turgor, no rashes, no suspicious skin lesions noted Routine exam Bleeding with IC Pap sent yes Mammogram due yes normal exam.  encouraged the use of lubrication.  US@[facility]  pt with mild pain on right adnexa wiht exam RT 3 weeks

## 2011-11-05 DIAGNOSIS — Z9889 Other specified postprocedural states: Secondary | ICD-10-CM | POA: Insufficient documentation

## 2011-11-05 NOTE — Assessment & Plan Note (Signed)
Encouraged her.

## 2011-11-05 NOTE — Assessment & Plan Note (Signed)
No recurrence since surgery. Sternum has now healed.  Doing well overall, and has much better habits.  Encouragement.

## 2011-11-05 NOTE — Assessment & Plan Note (Signed)
No recent lipid and liver here.

## 2011-11-05 NOTE — Assessment & Plan Note (Signed)
Under treatment. 

## 2011-11-05 NOTE — Assessment & Plan Note (Addendum)
No obvious murmur on exam.  Has not really had an assessment late post op, and would benefit from information regarding function after repair.

## 2011-11-06 LAB — PAP IG W/ RFLX HPV ASCU

## 2011-11-08 ENCOUNTER — Ambulatory Visit (HOSPITAL_COMMUNITY): Payer: Medicaid Other | Attending: Cardiovascular Disease

## 2011-11-08 DIAGNOSIS — E785 Hyperlipidemia, unspecified: Secondary | ICD-10-CM | POA: Insufficient documentation

## 2011-11-08 DIAGNOSIS — I252 Old myocardial infarction: Secondary | ICD-10-CM | POA: Insufficient documentation

## 2011-11-08 DIAGNOSIS — I080 Rheumatic disorders of both mitral and aortic valves: Secondary | ICD-10-CM

## 2011-11-08 DIAGNOSIS — E119 Type 2 diabetes mellitus without complications: Secondary | ICD-10-CM | POA: Insufficient documentation

## 2011-11-08 DIAGNOSIS — Z954 Presence of other heart-valve replacement: Secondary | ICD-10-CM | POA: Insufficient documentation

## 2011-11-08 DIAGNOSIS — I059 Rheumatic mitral valve disease, unspecified: Secondary | ICD-10-CM

## 2011-11-08 DIAGNOSIS — I2510 Atherosclerotic heart disease of native coronary artery without angina pectoris: Secondary | ICD-10-CM | POA: Insufficient documentation

## 2011-11-08 DIAGNOSIS — Z87891 Personal history of nicotine dependence: Secondary | ICD-10-CM | POA: Insufficient documentation

## 2011-11-08 NOTE — Progress Notes (Signed)
Echocardiogram performed.  

## 2011-11-14 ENCOUNTER — Ambulatory Visit (INDEPENDENT_AMBULATORY_CARE_PROVIDER_SITE_OTHER): Payer: Medicaid Other | Admitting: General Surgery

## 2011-11-14 ENCOUNTER — Encounter (INDEPENDENT_AMBULATORY_CARE_PROVIDER_SITE_OTHER): Payer: Self-pay | Admitting: General Surgery

## 2011-11-14 ENCOUNTER — Encounter (INDEPENDENT_AMBULATORY_CARE_PROVIDER_SITE_OTHER): Payer: Self-pay

## 2011-11-14 VITALS — BP 119/76 | HR 74 | Temp 98.2°F | Ht 64.0 in | Wt 212.2 lb

## 2011-11-14 DIAGNOSIS — L723 Sebaceous cyst: Secondary | ICD-10-CM

## 2011-11-14 NOTE — Progress Notes (Signed)
Patient ID: Elizabeth Park, female   DOB: [birth year], 41 y.o.   MRN: [id number]  Chief Complaint  Patient presents with  . Pre-op Exam    eval abd wall cyst    HPI Elizabeth Park is a 41 y.o. female.   HPI  She is referred by Dr. Bruna Potter for evaluation of an abdominal wall sebaceous cyst. This has been present for many years. She does have her clothes rub against it and make it somewhat inflamed and enlarged. It has not been frankly infected. She is a type I diabetic.  Past Medical History  Diagnosis Date  . Diabetes mellitus   . High cholesterol   . MI (myocardial infarction)   . MRSA (methicillin resistant Staphylococcus aureus)   . MVP (mitral valve prolapse)   . Headache, migraine   . History of blood transfusion   . H/O emotional problems   . CHF (congestive heart failure)   . Hypertension   . Neuromuscular disorder   . Heart attack     Past Surgical History  Procedure Date  . Coronary artery bypass graft with valve repair  . Abdominal hysterectomy   . Skin graft     Family History  Problem Relation Age of Onset  . Emphysema Maternal Grandmother   . Heart disease Father   . Asthma Father   . Hypertension Father   . Diabetes Father   . Heart disease Mother   . Hypertension Mother   . Diabetes Mother   . Migraines Mother   . Stroke Mother   . Hypertension Brother   . Diabetes Brother     Social History History  Substance Use Topics  . Smoking status: Former Smoker    Quit date: 04/01/2011  . Smokeless tobacco: Never Used  . Alcohol Use: No    Allergies  Allergen Reactions  . Metformin Diarrhea and Nausea And Vomiting    Current Outpatient Prescriptions  Medication Sig Dispense Refill  . aspirin 81 MG tablet Take 325 mg by mouth daily.       Marland Kitchen doxycycline (DORYX) 100 MG DR capsule Take 100 mg by mouth 2 (two) times daily.      . insulin aspart (NOVOLOG FLEXPEN) 100 UNIT/ML injection Inject 10-12 Units into the skin 2 (two) times daily. 10 units at  breakfast and 12 units at dinner      . insulin glargine (LANTUS SOLOSTAR) 100 UNIT/ML injection Inject 28 Units into the skin at bedtime.      Marland Kitchen lisinopril (PRINIVIL,ZESTRIL) 10 MG tablet Take 10 mg by mouth daily.      . metoprolol tartrate (LOPRESSOR) 25 MG tablet Take 12.5 mg by mouth 2 (two) times daily.      . metroNIDAZOLE (METROGEL) 1 % gel Apply 1 application topically 2 (two) times daily.      . pregabalin (LYRICA) 150 MG capsule Take 150 mg by mouth 2 (two) times daily.      . simvastatin (ZOCOR) 40 MG tablet Take 40 mg by mouth at bedtime.        Review of Systems Review of Systems  Constitutional: Negative for fever and chills.  Gastrointestinal: Negative for abdominal pain.    Blood pressure 119/76, pulse 74, temperature 98.2 F (36.8 C), temperature source Temporal, height 5\' 4"  (1.626 m), weight 212 lb 3.2 oz (96.253 kg), SpO2 98.00%.  Physical Exam Physical Exam  Constitutional:       Overweight female in no acute distress.  Pulmonary/Chest:  Wide mid sternal scar  Abdominal:       1.5 cm subcutaneous lesion in the left abdominal wall that is mobile and not erythematous.    Data Reviewed Dr. Daneil Dan note  Assessment    Intermittently inflamed sebaceous cyst of the abdominal wall    Plan    Removal of the cyst under local anesthesia. The procedure and risks were explained to her.  Risks include but are not limited to bleeding, infection, healing problems, and recurrence.       Harnoor Kohles J 11/14/2011, 2:39 PM

## 2011-11-14 NOTE — Patient Instructions (Signed)
Stop the Aspirin 5 days before the procedure.

## 2011-11-20 ENCOUNTER — Ambulatory Visit
Admission: RE | Admit: 2011-11-20 | Discharge: 2011-11-20 | Disposition: A | Discharge status: Home / Self Care | Payer: Medicaid Other | Source: Ambulatory Visit | Attending: Obstetrics and Gynecology | Admitting: Obstetrics and Gynecology

## 2011-11-20 DIAGNOSIS — Z124 Encounter for screening for malignant neoplasm of cervix: Secondary | ICD-10-CM

## 2011-11-24 ENCOUNTER — Ambulatory Visit (INDEPENDENT_AMBULATORY_CARE_PROVIDER_SITE_OTHER): Payer: Medicaid Other | Admitting: General Surgery

## 2011-11-24 DIAGNOSIS — L723 Sebaceous cyst: Secondary | ICD-10-CM

## 2011-11-24 IMAGING — CT CT CHEST W/O CM
2 of 4 series · 15 of 36 positions shown, 18 images · non-contrast
Comparison: 05/26/2009

CLINICAL DATA: Open heart surgery 04/07/2009.  Chest pain.
Mediastinitis.

CT CHEST WITHOUT CONTRAST
TECHNIQUE: Multidetector CT imaging of the chest was performed
following the standard protocol without IV contrast.

[Series 2: rtn chest without st · axial · non-contrast · 0.75mm/px · z∈[-274,-14]mm · 12 of 61 slices shown, 15 images]
[im 5/61  mediastinal]
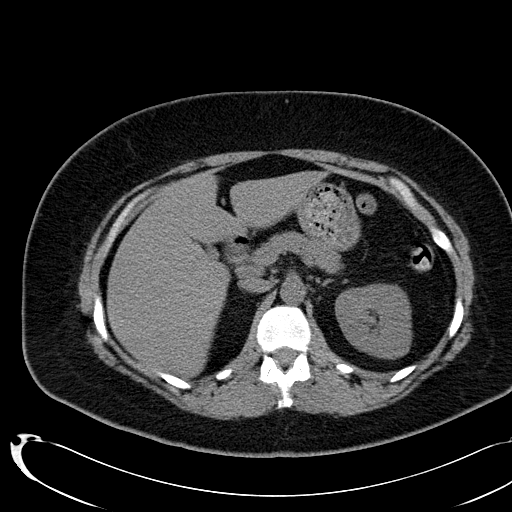
[im 5/61  lung]
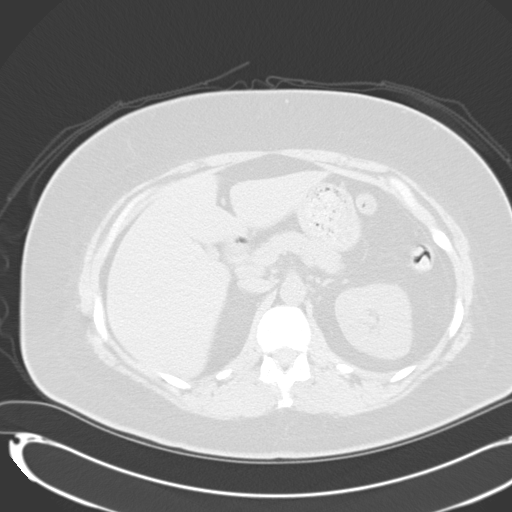
[im 9/61  lung]
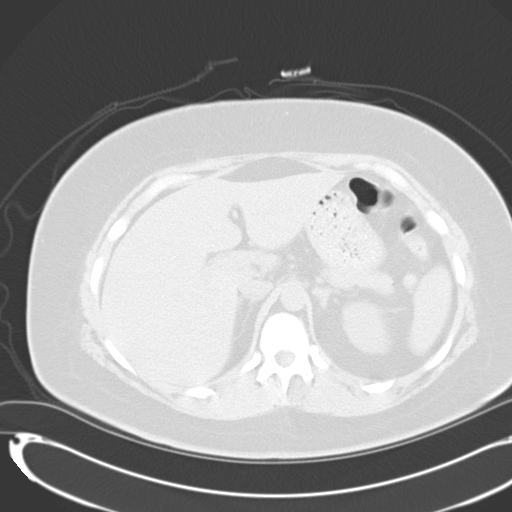
[im 13/61  lung]
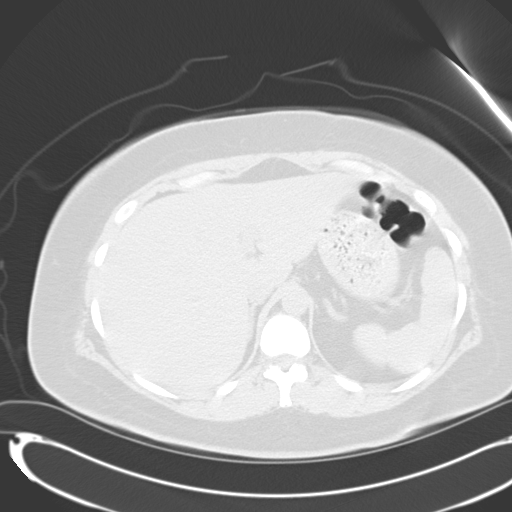
[im 21/61  lung]
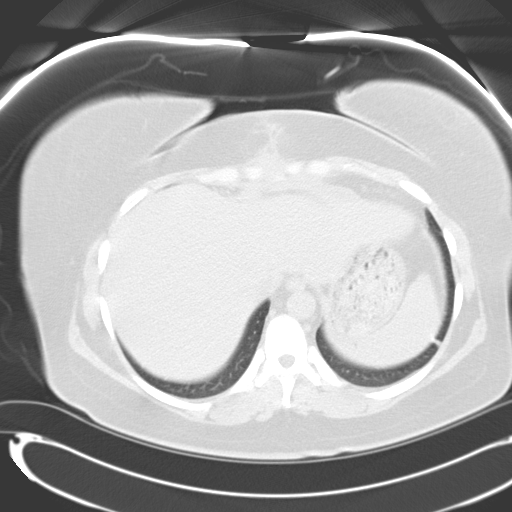
[im 25/61  mediastinal]
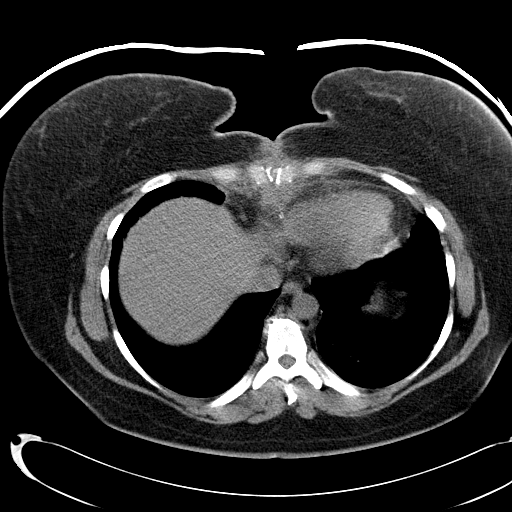
[im 25/61  lung]
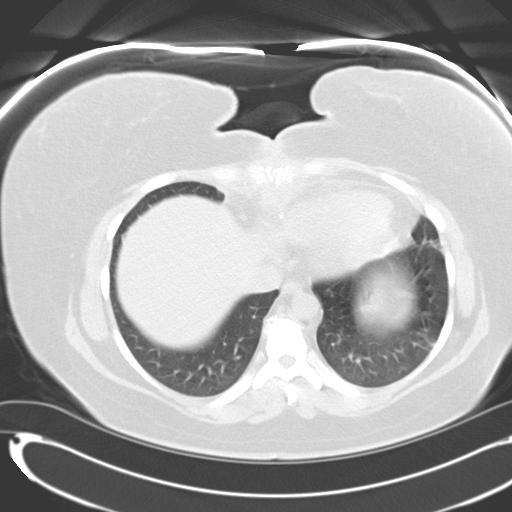
[im 29/61  lung]
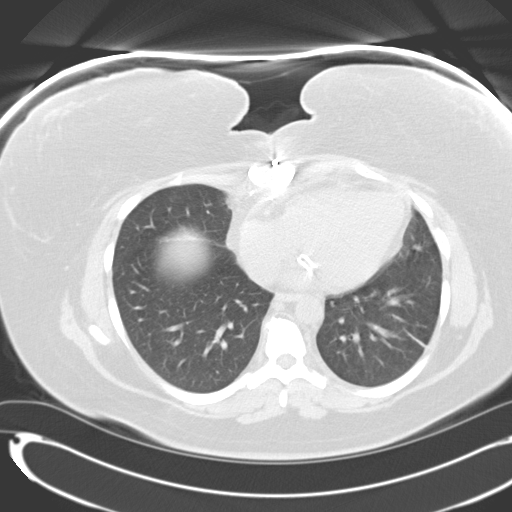
[im 33/61  lung]
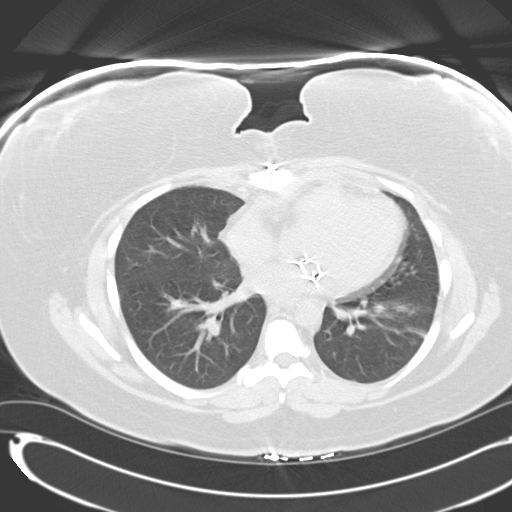
[im 37/61  lung]
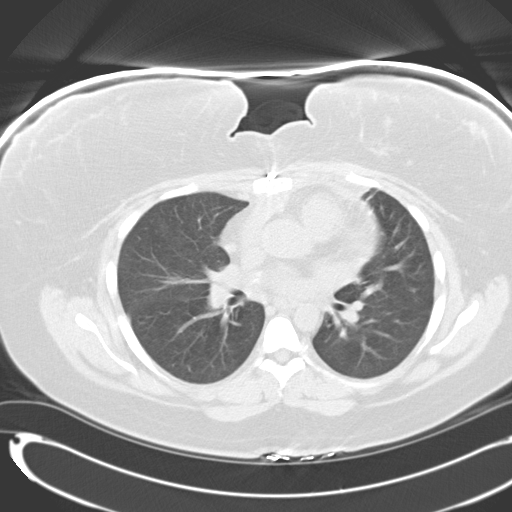
[im 41/61  mediastinal]
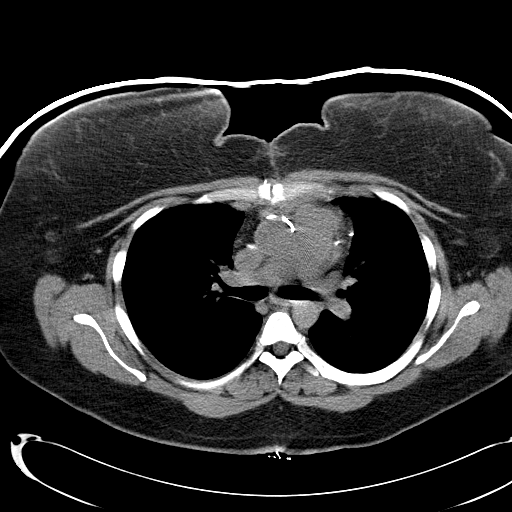
[im 41/61  lung]
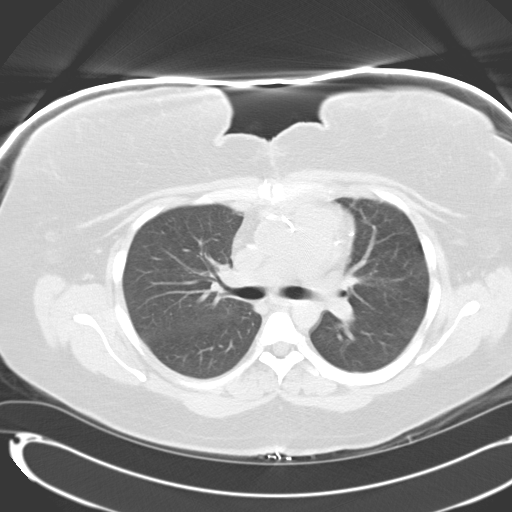
[im 49/61  lung]
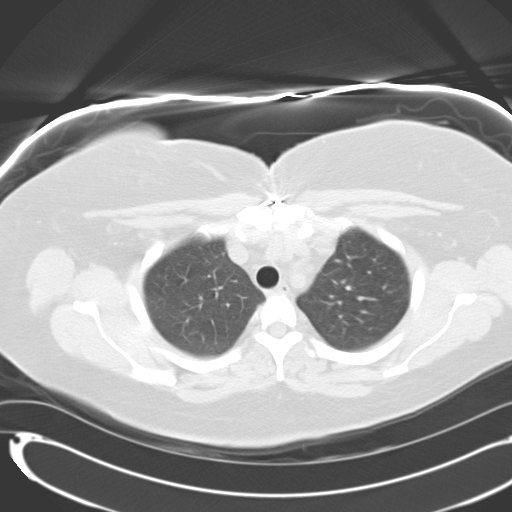
[im 53/61  lung]
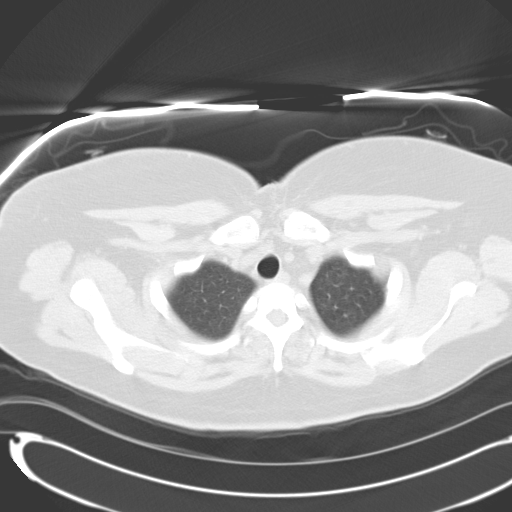
[im 57/61  lung]
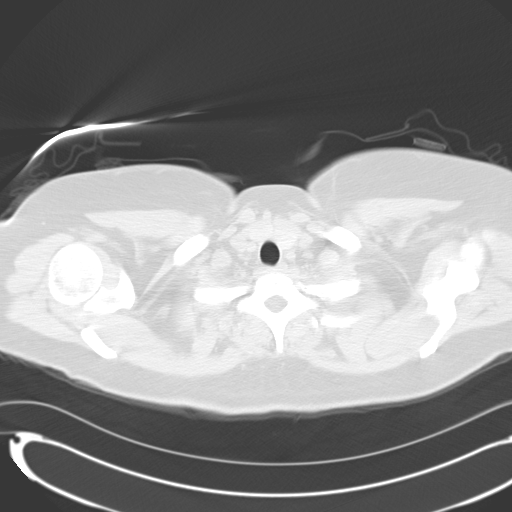

[Series 602: coronal images · coronal · 0.75mm/px · 3 of 46 slices shown]
[im 10/46  lung]
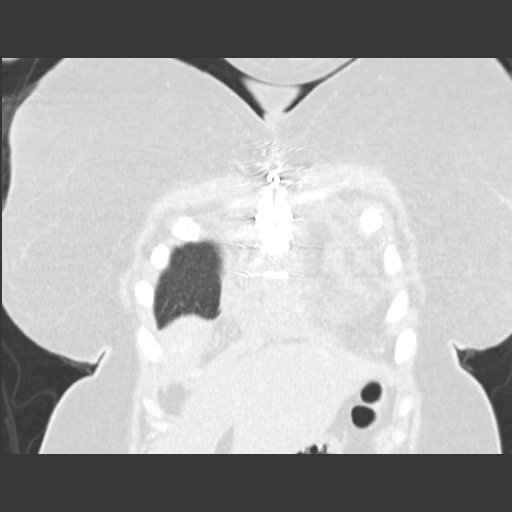
[im 19/46  lung]
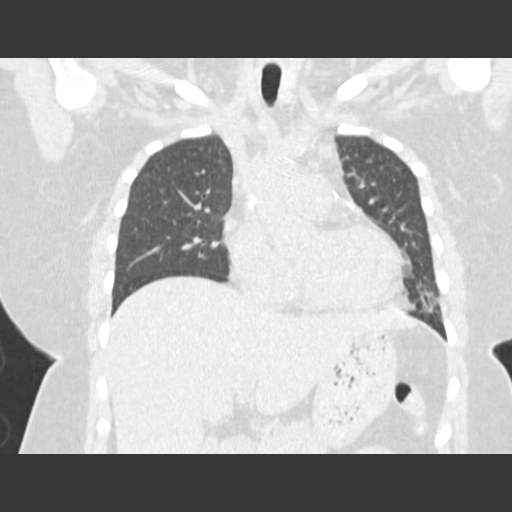
[im 28/46  lung]
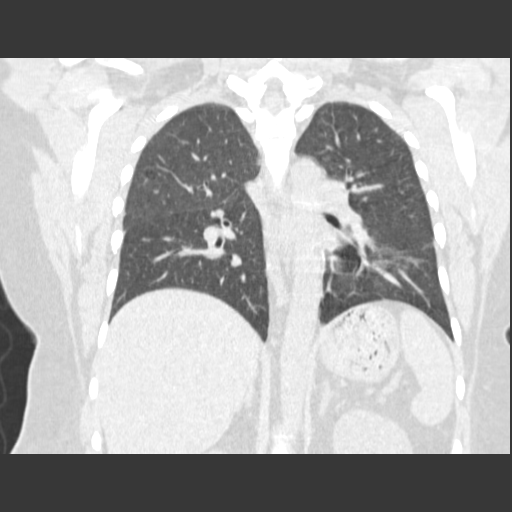

[15 of 36 positions shown; findings below may reference images not displayed]

FINDINGS: Mediastinal lymph nodes measure up to 9 mm.  Hilar
regions are difficult to definitively evaluate without IV contrast.
No axillary adenopathy.  There are changes of median sternotomy,
with removal of the inferior-most suture.  Soft tissue density is
seen in the lower parasternal region, as before.  No pericardial
effusion.

There are a few scattered areas of probable subsegmental
atelectasis.  No pleural fluid.  Airway is unremarkable.

Incidental imaging of the upper abdomen shows no acute findings.
No worrisome lytic or sclerotic lesions.
IMPRESSION: Inflammatory/phlegmonous changes about the lower sternum, without
definitive evidence of abscess.

## 2011-11-24 NOTE — Addendum Note (Signed)
Addended by: Milas Hock on: 11/24/2011 04:38 PM   Modules accepted: Orders

## 2011-11-24 NOTE — Patient Instructions (Signed)
Remove bandage on Monday.  You may shower.  Call for heavy bleeding or signs of infection.  Apply ice to the area as much as possible.  Rest today.

## 2011-11-24 NOTE — Progress Notes (Signed)
PROCEDURE NOTE  Preop Dx:  1.5 cm sebaceous cyst of abdominal wall  Postop Dx:  Same  Procedure:  Excision of sebaceous cyst from abdominal wall  Surgeon:  Abbey Chatters  Anes:  1% Xylocaine plain  Technique:  She was supine on the table in the procedure room.  The cyst was marked on the left abdominal wall then sterilely prepped and draped.  Local anesthetic was infiltrated all around the cyst superficially and deep.  An elliptical incision was made through the skin and dermis and using sharp dissection the cyst was removed intact and sent to pathology.  Bleeding was controlled with electrocautery.  Once hemostasis was adequate, the subcutaneous tissue was approximated with a running 3-0 Vicryl suture.  The skin was closed with a running 4-0 Monocryl subcuticular stitch.  Steri strips and sterile dressings were applied.  She tolerated the procedure well and was given discharge instructions.

## 2011-11-27 ENCOUNTER — Telehealth (INDEPENDENT_AMBULATORY_CARE_PROVIDER_SITE_OTHER): Payer: Self-pay

## 2011-11-27 ENCOUNTER — Encounter: Payer: Self-pay | Admitting: Obstetrics and Gynecology

## 2011-11-27 NOTE — Telephone Encounter (Signed)
Made pt aware of her benign pathology results.  She was c/o wide spread bruising outside the perimeter of her bandage.  Dr. Abbey Chatters said that was normal for someone on regular regimen of ASA, and that with applied ice it should get better.

## 2011-12-01 DIAGNOSIS — Z0271 Encounter for disability determination: Secondary | ICD-10-CM

## 2011-12-13 ENCOUNTER — Ambulatory Visit (INDEPENDENT_AMBULATORY_CARE_PROVIDER_SITE_OTHER): Payer: Medicaid Other | Admitting: General Surgery

## 2011-12-13 ENCOUNTER — Encounter (INDEPENDENT_AMBULATORY_CARE_PROVIDER_SITE_OTHER): Payer: Self-pay | Admitting: General Surgery

## 2011-12-13 VITALS — BP 134/72 | HR 70 | Temp 97.3°F | Resp 16 | Ht 64.0 in | Wt 210.4 lb

## 2011-12-13 DIAGNOSIS — Z9889 Other specified postprocedural states: Secondary | ICD-10-CM

## 2011-12-13 NOTE — Progress Notes (Signed)
Patient ID: Elizabeth Park, female   DOB: May 19, 1970, 41 y.o.   MRN: [id number] She is here after removal of a superficial abdominal wall mass.  Pathology demonstrates an epidermoid cyst. She is doing well. She does feel a little bit of the suture in one end of the wound.  On exam, the left upper quadrant abdominal wall wound is clean and intact. There is a very small piece of exposed absorbable suture present.  Assessment:  Pathology is benign and wound is healing well.  Plan:  I told her the suture would dissolve or you she could trim it with clean finger nail clippers.  RTC prn.

## 2011-12-13 NOTE — Patient Instructions (Signed)
Call if you have any wound problems. 

## 2011-12-26 ENCOUNTER — Telehealth: Payer: Self-pay | Admitting: Obstetrics and Gynecology

## 2011-12-26 NOTE — Telephone Encounter (Signed)
Spoke with pt rgd msg answered pt questions rgd dense breast letter

## 2012-01-22 ENCOUNTER — Ambulatory Visit: Payer: Medicaid Other

## 2012-01-24 ENCOUNTER — Encounter (HOSPITAL_BASED_OUTPATIENT_CLINIC_OR_DEPARTMENT_OTHER): Payer: Self-pay | Admitting: Emergency Medicine

## 2012-01-24 ENCOUNTER — Emergency Department (HOSPITAL_BASED_OUTPATIENT_CLINIC_OR_DEPARTMENT_OTHER)
Admission: EM | Admit: 2012-01-24 | Discharge: 2012-01-24 | Disposition: A | Discharge status: Home / Self Care | Payer: Medicaid Other | Attending: Emergency Medicine | Admitting: Emergency Medicine

## 2012-01-24 DIAGNOSIS — K5289 Other specified noninfective gastroenteritis and colitis: Secondary | ICD-10-CM | POA: Insufficient documentation

## 2012-01-24 DIAGNOSIS — G43909 Migraine, unspecified, not intractable, without status migrainosus: Secondary | ICD-10-CM | POA: Insufficient documentation

## 2012-01-24 DIAGNOSIS — R7309 Other abnormal glucose: Secondary | ICD-10-CM | POA: Insufficient documentation

## 2012-01-24 DIAGNOSIS — Z951 Presence of aortocoronary bypass graft: Secondary | ICD-10-CM | POA: Insufficient documentation

## 2012-01-24 DIAGNOSIS — Z794 Long term (current) use of insulin: Secondary | ICD-10-CM | POA: Insufficient documentation

## 2012-01-24 DIAGNOSIS — E7800 Pure hypercholesterolemia, unspecified: Secondary | ICD-10-CM | POA: Insufficient documentation

## 2012-01-24 DIAGNOSIS — R1110 Vomiting, unspecified: Secondary | ICD-10-CM | POA: Insufficient documentation

## 2012-01-24 DIAGNOSIS — I252 Old myocardial infarction: Secondary | ICD-10-CM | POA: Insufficient documentation

## 2012-01-24 DIAGNOSIS — Z7982 Long term (current) use of aspirin: Secondary | ICD-10-CM | POA: Insufficient documentation

## 2012-01-24 DIAGNOSIS — R6883 Chills (without fever): Secondary | ICD-10-CM | POA: Insufficient documentation

## 2012-01-24 DIAGNOSIS — Z79899 Other long term (current) drug therapy: Secondary | ICD-10-CM | POA: Insufficient documentation

## 2012-01-24 DIAGNOSIS — I10 Essential (primary) hypertension: Secondary | ICD-10-CM | POA: Insufficient documentation

## 2012-01-24 DIAGNOSIS — K529 Noninfective gastroenteritis and colitis, unspecified: Secondary | ICD-10-CM

## 2012-01-24 DIAGNOSIS — R739 Hyperglycemia, unspecified: Secondary | ICD-10-CM

## 2012-01-24 DIAGNOSIS — E119 Type 2 diabetes mellitus without complications: Secondary | ICD-10-CM | POA: Insufficient documentation

## 2012-01-24 DIAGNOSIS — R12 Heartburn: Secondary | ICD-10-CM | POA: Insufficient documentation

## 2012-01-24 DIAGNOSIS — R51 Headache: Secondary | ICD-10-CM | POA: Insufficient documentation

## 2012-01-24 LAB — CBC WITH DIFFERENTIAL/PLATELET
Basophils Absolute: 0 10*3/uL (ref 0.0–0.1)
Basophils Relative: 0 % (ref 0–1)
Eosinophils Absolute: 0.1 10*3/uL (ref 0.0–0.7)
Eosinophils Relative: 1 % (ref 0–5)
HCT: 42.3 % (ref 36.0–46.0)
Hemoglobin: 15.4 g/dL — ABNORMAL HIGH (ref 12.0–15.0)
Lymphocytes Relative: 21 % (ref 12–46)
Lymphs Abs: 2.4 10*3/uL (ref 0.7–4.0)
MCH: 31.3 pg (ref 26.0–34.0)
MCHC: 36.4 g/dL — ABNORMAL HIGH (ref 30.0–36.0)
MCV: 86 fL (ref 78.0–100.0)
Monocytes Absolute: 0.7 10*3/uL (ref 0.1–1.0)
Monocytes Relative: 7 % (ref 3–12)
Neutro Abs: 7.8 10*3/uL — ABNORMAL HIGH (ref 1.7–7.7)
Neutrophils Relative %: 71 % (ref 43–77)
Platelets: 315 10*3/uL (ref 150–400)
RBC: 4.92 MIL/uL (ref 3.87–5.11)
RDW: 12.3 % (ref 11.5–15.5)
WBC: 11 10*3/uL — ABNORMAL HIGH (ref 4.0–10.5)

## 2012-01-24 LAB — COMPREHENSIVE METABOLIC PANEL
ALT: 26 U/L (ref 0–35)
AST: 21 U/L (ref 0–37)
Albumin: 4.3 g/dL (ref 3.5–5.2)
Alkaline Phosphatase: 66 U/L (ref 39–117)
BUN: 13 mg/dL (ref 6–23)
CO2: 22 mEq/L (ref 19–32)
Calcium: 9.8 mg/dL (ref 8.4–10.5)
Chloride: 99 mEq/L (ref 96–112)
Creatinine, Ser: 0.8 mg/dL (ref 0.50–1.10)
GFR calc Af Amer: >90 mL/min (ref >90)
GFR calc non Af Amer: 89 mL/min — ABNORMAL LOW (ref >90)
Glucose, Bld: 212 mg/dL — ABNORMAL HIGH (ref 70–99)
Potassium: 3.4 mEq/L — ABNORMAL LOW (ref 3.5–5.1)
Sodium: 136 mEq/L (ref 135–145)
Total Bilirubin: 0.9 mg/dL (ref 0.3–1.2)
Total Protein: 8.3 g/dL (ref 6.0–8.3)

## 2012-01-24 LAB — LIPASE, BLOOD: Lipase: 19 U/L (ref 11–59)

## 2012-01-24 LAB — TROPONIN I: Troponin I: <0.3 ng/mL (ref <0.30)

## 2012-01-24 MED ORDER — KETOROLAC TROMETHAMINE 30 MG/ML IJ SOLN
30.0000 mg | Freq: Once | INTRAMUSCULAR | Status: AC
  Administered 2012-01-24: 30 mg via INTRAVENOUS
  Filled 2012-01-24: qty 1

## 2012-01-24 MED ORDER — ONDANSETRON HCL 4 MG/2ML IJ SOLN
4.0000 mg | Freq: Once | INTRAMUSCULAR | Status: AC
  Administered 2012-01-24: 4 mg via INTRAVENOUS
  Filled 2012-01-24: qty 2

## 2012-01-24 MED ORDER — SODIUM CHLORIDE 0.9 % IV BOLUS (SEPSIS)
1000.0000 mL | Freq: Once | INTRAVENOUS | Status: AC
  Administered 2012-01-24: 1000 mL via INTRAVENOUS

## 2012-01-24 MED ORDER — PROMETHAZINE HCL 25 MG PO TABS: 25.0000 mg | ORAL_TABLET | Freq: Four times a day (QID) | ORAL | Status: DC | PRN

## 2012-01-24 NOTE — ED Notes (Signed)
Pt c/o N/V/D and chills. Pt states she "feels hot inside". Pt also reports hx DM and recent high blood sugar since onset of symptoms.

## 2012-01-24 NOTE — ED Provider Notes (Signed)
History     CSN: [id number]  Arrival date & time 01/24/12  1200   First MD Initiated Contact with Patient 01/24/12 1232      Chief Complaint  Patient presents with  . Emesis  . Diarrhea  . 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  Presents with four day history of n/v and today began with diarrhea.  All has been non-bloody, non-bilious.  She denies fevers or chills.  She denies abd pain.  Her sugar was in the upper 200's this morning.  She took extra insulin.  Patient is a 41 y.o. female presenting with vomiting. The history is provided by the patient.  Emesis  This is a new problem. Episode onset: 3 days ago. The problem occurs continuously. The problem has been gradually worsening. The emesis has an appearance of stomach contents. There has been no fever. Associated symptoms include chills and diarrhea. Pertinent negatives include no abdominal pain and no fever.    Past Medical History  Diagnosis Date  . Diabetes mellitus   . High cholesterol   . MI (myocardial infarction)   . MRSA (methicillin resistant Staphylococcus aureus)   . MVP (mitral valve prolapse)   . Headache, migraine   . History of blood transfusion   . H/O emotional problems   . CHF (congestive heart failure)   . Hypertension   . Neuromuscular disorder   . Heart attack   . Sebaceous cyst     Past Surgical History  Procedure Date  . Coronary artery bypass graft with valve repair  . Abdominal hysterectomy   . Skin graft     Family History  Problem Relation Age of Onset  . Emphysema Maternal Grandmother   . Heart disease Father   . Asthma Father   . Hypertension Father   . Diabetes Father   . Heart disease Mother   . Hypertension Mother   . Diabetes Mother   . Migraines Mother   . Stroke Mother   . Hypertension Brother   . Diabetes Brother     History    Substance Use Topics  . Smoking status: Former Smoker    Quit date: 04/01/2011  . Smokeless tobacco: Never Used  . Alcohol Use: No    OB History    Grav Para Term Preterm Abortions TAB SAB Ect Mult Living   2 2 2       2       Review of Systems  Constitutional: Positive for chills. Negative for fever.  Gastrointestinal: Positive for vomiting and diarrhea. Negative for abdominal pain.  All other systems reviewed and are negative.    Allergies  Metformin  Home Medications   Current Outpatient Rx  Name Route Sig Dispense Refill  . ASPIRIN 81 MG PO TABS Oral Take 325 mg by mouth daily.     Marland Kitchen DOXYCYCLINE HYCLATE 100 MG PO CPEP Oral Take 100 mg by mouth 2 (two) times daily.    . INSULIN ASPART 100 UNIT/ML [place] SOLN Subcutaneous Inject 10-12 Units into the skin 2 (two) times daily. 10 units at breakfast and 12 units at dinner    . INSULIN GLARGINE 100 UNIT/ML [place] SOLN Subcutaneous Inject 28 Units into the skin at bedtime.    Marland Kitchen LISINOPRIL 10 MG PO TABS Oral Take 10 mg by mouth daily.    Marland Kitchen METOPROLOL TARTRATE 25 MG PO TABS Oral Take 12.5 mg by mouth 2 (two) times  daily.    Marland Kitchen METRONIDAZOLE 1 % EX GEL Topical Apply 1 application topically 2 (two) times daily.    Marland Kitchen SIMVASTATIN 40 MG PO TABS Oral Take 40 mg by mouth at bedtime.      BP 128/90  Pulse 83  Temp 98.4 F (36.9 C) (Oral)  Resp 16  Ht 5\' 4"  (1.626 m)  Wt 200 lb (90.719 kg)  BMI 34.33 kg/m2  SpO2 98%  Physical Exam  Nursing note and vitals reviewed. Constitutional: She is oriented to person, place, and time. She appears well-developed and well-nourished. No distress.  HENT:  Head: Normocephalic and atraumatic.  Neck: Normal range of motion. Neck supple.  Cardiovascular: Normal rate and regular rhythm.  Exam reveals no gallop and no friction rub.   No murmur heard. Pulmonary/Chest: Effort normal and breath sounds normal. No respiratory distress. She has no wheezes.  Abdominal: Soft. Bowel sounds are normal. She  exhibits no distension. There is no tenderness.  Musculoskeletal: Normal range of motion.  Neurological: She is alert and oriented to person, place, and time.  Skin: Skin is warm and dry. She is not diaphoretic.    ED Course  Procedures (including critical care time)   Labs Reviewed  CBC WITH DIFFERENTIAL  COMPREHENSIVE METABOLIC PANEL  LIPASE, BLOOD  TROPONIN I   No results found.   No diagnosis found.   Date: 01/24/2012  Rate: 74  Rhythm: normal sinus rhythm  QRS Axis: left  Intervals: normal  ST/T Wave abnormalities: normal  Conduction Disutrbances:none  Narrative Interpretation:   Old EKG Reviewed: unchanged    MDM  The patient presents with n/v/d that sounds viral in nature.  She was given ivf's and zofran and is feeling better.   The labs do not reflect dka and the ekg and troponin are reassuring.  These were checked as she has a history of cabg and to ensure this was not an atypical presentation for mi.  She will be discharged to home, to return prn.        Geoffery Lyons, MD 01/24/12 1416

## 2012-01-29 ENCOUNTER — Ambulatory Visit (INDEPENDENT_AMBULATORY_CARE_PROVIDER_SITE_OTHER): Payer: Medicaid Other | Admitting: Thoracic Surgery (Cardiothoracic Vascular Surgery)

## 2012-01-29 ENCOUNTER — Encounter: Payer: Self-pay | Admitting: Thoracic Surgery (Cardiothoracic Vascular Surgery)

## 2012-01-29 VITALS — BP 131/95 | HR 76 | Resp 18 | Ht 64.0 in | Wt 190.0 lb

## 2012-01-29 DIAGNOSIS — S21109A Unspecified open wound of unspecified front wall of thorax without penetration into thoracic cavity, initial encounter: Secondary | ICD-10-CM

## 2012-01-29 DIAGNOSIS — I340 Nonrheumatic mitral (valve) insufficiency: Secondary | ICD-10-CM

## 2012-01-29 DIAGNOSIS — I2510 Atherosclerotic heart disease of native coronary artery without angina pectoris: Secondary | ICD-10-CM

## 2012-01-29 DIAGNOSIS — I059 Rheumatic mitral valve disease, unspecified: Secondary | ICD-10-CM

## 2012-01-29 DIAGNOSIS — Z09 Encounter for follow-up examination after completed treatment for conditions other than malignant neoplasm: Secondary | ICD-10-CM | POA: Insufficient documentation

## 2012-01-29 NOTE — Patient Instructions (Signed)
Avoid rubbing or touching area over sternum (breastbone) as much as possible.  Use high power sun screen to prevent sunburn.

## 2012-01-29 NOTE — Progress Notes (Signed)
[address]            [place] [address]          [phone]     CARDIOTHORACIC SURGERY OFFICE NOTE  Referring Provider is Herby Abraham, MD PCP is Burtis Junes, MD   HPI:  Patient is a 41 year old obese diabetic female 51 coronary artery bypass grafting x4 and mitral valve repair on 04/07/2009. Postoperatively the patient developed superficial skin edge necrosis of her sternal incision which required surgical debridement and ultimately skin graft coverage.  All of the sternal wires were removed but the sternum remained intact and healed.  The patient now returns to our office because she felt a lump at the superior aspect of her sternal scar and was concerned about it. She has not had any fevers or chills. She denies any sort of skin breakdown or drainage. She otherwise feels quite well and is planning to move to [place].   Current Outpatient Prescriptions  Medication Sig Dispense Refill  . aspirin 81 MG tablet Take 325 mg by mouth daily.       Marland Kitchen doxycycline (DORYX) 100 MG DR capsule Take 100 mg by mouth 2 (two) times daily.      . insulin aspart (NOVOLOG FLEXPEN) 100 UNIT/ML injection Inject 10-12 Units into the skin 2 (two) times daily. 10 units at breakfast and 12 units at dinner      . insulin glargine (LANTUS SOLOSTAR) 100 UNIT/ML injection Inject 28 Units into the skin at bedtime.      Marland Kitchen lisinopril (PRINIVIL,ZESTRIL) 10 MG tablet Take 10 mg by mouth daily.      . metoprolol tartrate (LOPRESSOR) 25 MG tablet Take 12.5 mg by mouth 2 (two) times daily.      . metroNIDAZOLE (METROGEL) 1 % gel Apply 1 application topically 2 (two) times daily.      . promethazine (PHENERGAN) 25 MG tablet Take 1 tablet (25 mg total) by mouth every 6 (six) hours as needed for nausea.  10 tablet  0  . simvastatin (ZOCOR) 40 MG tablet Take 40 mg by mouth at bedtime.          Physical Exam:   BP 131/95  Pulse 76  Resp 18  Ht 5\' 4"  (1.626 m)  Wt 190  lb (86.183 kg)  BMI 32.61 kg/m2  SpO2 97%  General:  Well-appearing obese female  Chest:   Clear to auscultation with symmetrical breath sounds  CV:   Regular rate and rhythm without murmur  Incisions:  The scar from previous skin graft overlying the median sternotomy remains completely intact and healed. There is no redness nor swelling. There is no tenderness on palpation. The lump the patient is concerned about corresponds to the manubrium.  The sternum is intact and without tenderness.  Abdomen:  Obese but soft nontender  Extremities:  Warm and well-perfused  Diagnostic Tests:  n/a  Impression:  Normal contour of the sternum and manubrium with completely healed scar from previous skin graft performed more than 2 years ago. There is no evidence for any ongoing problem at this time.  Plan:  I reassured the patient that there does not appear to be anything wrong. I've cautioned her to avoid rubbing or manipulating the area she is concerned about as ultimately skin breakdown could occur and lead to infection. She has also been reminded to use very high power  sunscreen or wear clothing that covers her scar. She will return to see [place] in the future only as needed.     Salvatore Decent. [name], MD 01/29/2012 11:29 AM

## 2012-02-09 ENCOUNTER — Ambulatory Visit: Payer: Medicaid Other | Attending: Anesthesiology | Admitting: Physical Therapy

## 2012-02-09 DIAGNOSIS — M25579 Pain in unspecified ankle and joints of unspecified foot: Secondary | ICD-10-CM | POA: Insufficient documentation

## 2012-02-09 DIAGNOSIS — M545 Low back pain, unspecified: Secondary | ICD-10-CM | POA: Insufficient documentation

## 2012-02-09 DIAGNOSIS — IMO0001 Reserved for inherently not codable concepts without codable children: Secondary | ICD-10-CM | POA: Insufficient documentation

## 2012-02-16 ENCOUNTER — Ambulatory Visit: Payer: Medicaid Other | Admitting: Rehabilitation

## 2012-02-21 ENCOUNTER — Ambulatory Visit: Payer: Medicaid Other | Admitting: Physical Therapy

## 2012-02-28 ENCOUNTER — Ambulatory Visit: Payer: Medicaid Other | Admitting: Rehabilitation

## 2012-05-09 ENCOUNTER — Ambulatory Visit: Payer: Medicaid Other | Admitting: Cardiology

## 2012-05-17 ENCOUNTER — Ambulatory Visit: Payer: Medicaid Other | Admitting: Cardiology

## 2012-05-24 ENCOUNTER — Ambulatory Visit (INDEPENDENT_AMBULATORY_CARE_PROVIDER_SITE_OTHER): Payer: Medicaid Other | Admitting: Cardiology

## 2012-05-24 ENCOUNTER — Encounter: Payer: Self-pay | Admitting: Cardiology

## 2012-05-24 VITALS — BP 124/96 | HR 84 | Ht 64.0 in | Wt 218.0 lb

## 2012-05-24 DIAGNOSIS — I2510 Atherosclerotic heart disease of native coronary artery without angina pectoris: Secondary | ICD-10-CM

## 2012-05-24 DIAGNOSIS — E785 Hyperlipidemia, unspecified: Secondary | ICD-10-CM

## 2012-05-24 DIAGNOSIS — F17200 Nicotine dependence, unspecified, uncomplicated: Secondary | ICD-10-CM

## 2012-05-24 DIAGNOSIS — I10 Essential (primary) hypertension: Secondary | ICD-10-CM

## 2012-05-24 DIAGNOSIS — I080 Rheumatic disorders of both mitral and aortic valves: Secondary | ICD-10-CM

## 2012-05-24 NOTE — Progress Notes (Signed)
HPI:  Patient is in today for followup visit. From a cardiac standpoint she is doing extremely well. She has gained some weight, which she associates with some level of depression. She also tells me that her boyfriend told her that she needs to roll over at times, suggesting that she might have some level of sleep apnea. We talked about this with relationship to her weight in general.  Current Outpatient Prescriptions  Medication Sig Dispense Refill  . aspirin 81 MG tablet Take 325 mg by mouth daily.       Marland Kitchen doxycycline (DORYX) 100 MG DR capsule Take 100 mg by mouth 2 (two) times daily.      . insulin aspart (NOVOLOG FLEXPEN) 100 UNIT/ML injection Inject 10-12 Units into the skin 2 (two) times daily. 10 units at breakfast and 12 units at dinner      . insulin glargine (LANTUS SOLOSTAR) 100 UNIT/ML injection Inject 28 Units into the skin at bedtime.      Marland Kitchen lisinopril (PRINIVIL,ZESTRIL) 10 MG tablet Take 10 mg by mouth daily.      . metoprolol tartrate (LOPRESSOR) 25 MG tablet Take 12.5 mg by mouth 2 (two) times daily.      . metroNIDAZOLE (METROGEL) 1 % gel Apply 1 application topically 2 (two) times daily.      Marland Kitchen oxyCODONE-acetaminophen (PERCOCET/ROXICET) 5-325 MG per tablet Take 1 tablet by mouth every 4 (four) hours as needed for pain.      . simvastatin (ZOCOR) 40 MG tablet Take 40 mg by mouth at bedtime.       No current facility-administered medications for this visit.    Allergies  Allergen Reactions  . Metformin Diarrhea and Nausea And Vomiting    Past Medical History  Diagnosis Date  . Diabetes mellitus   . High cholesterol   . MI (myocardial infarction)   . MRSA (methicillin resistant Staphylococcus aureus)   . MVP (mitral valve prolapse)   . Headache, migraine   . History of blood transfusion   . H/O emotional problems   . CHF (congestive heart failure)   . Hypertension   . Neuromuscular disorder   . Heart attack   . Sebaceous cyst     Past Surgical History    Procedure Laterality Date  . Coronary artery bypass graft  with valve repair  . Abdominal hysterectomy    . Skin graft      Family History  Problem Relation Age of Onset  . Emphysema Maternal Grandmother   . Heart disease Father   . Asthma Father   . Hypertension Father   . Diabetes Father   . Heart disease Mother   . Hypertension Mother   . Diabetes Mother   . Migraines Mother   . Stroke Mother   . Hypertension Brother   . Diabetes Brother     History   Social History  . Marital Status: Divorced    Spouse Name: N/A    Number of Children: N/A  . Years of Education: N/A   Occupational History  . Not on file.   Social History Main Topics  . Smoking status: Former Smoker    Quit date: 04/01/2011  . Smokeless tobacco: Never Used  . Alcohol Use: No  . Drug Use: No  . Sexually Active: Yes    Birth Control/ Protection: Surgical     Comment: partial hysterectomy   Other Topics Concern  . Not on file   Social History Narrative  . No narrative on  file    ROS: Please see the HPI.  All other systems reviewed and negative.  PHYSICAL EXAM:  BP 124/96  Pulse 84  Ht 5\' 4"  (1.626 m)  Wt 218 lb (98.884 kg)  BMI 37.4 kg/m2  SpO2 97%  General: Well developed, well nourished, in no acute distress. Head:  Normocephalic and atraumatic. Neck: no JVD Lungs: Clear to auscultation and percussion. Heart: Normal S1 and S2.  No murmur, rubs or gallops.  Abdomen:  Normal bowel sounds; soft; non tender; no organomegaly Pulses: Pulses normal in all 4 extremities. Extremities: No clubbing or cyanosis. No edema. Neurologic: Alert and oriented x 3.  EKG:  NSR. Delay in R wave progression.  Cannot exclude inferior MI, age indeterminate.    ASSESSMENT AND PLAN:

## 2012-05-24 NOTE — Patient Instructions (Addendum)
Your physician wants you to follow-up in: 1 YEAR with Dr Clifton James (previous pt of Dr Riley Kill). You will receive a reminder letter in the mail two months in advance. If you don't receive a letter, please call our office to schedule the follow-up appointment.  Your physician recommends that you continue on your current medications as directed. Please refer to the Current Medication list given to you today.

## 2012-05-26 NOTE — Assessment & Plan Note (Signed)
She will need to make every effort not to smoke.

## 2012-05-26 NOTE — Assessment & Plan Note (Signed)
Overslept today and did not take her medications.

## 2012-05-26 NOTE — Assessment & Plan Note (Signed)
No signfiicant murmur.

## 2012-05-26 NOTE — Assessment & Plan Note (Addendum)
She is stable post CABG.  Had sternal issues but doing well.

## 2012-05-26 NOTE — Assessment & Plan Note (Signed)
She has had follow up with her primary, Dr. Bruna Potter, and had lipids--told they were ok.

## 2012-08-14 ENCOUNTER — Telehealth: Payer: Self-pay | Admitting: Cardiovascular Disease

## 2012-08-14 NOTE — Telephone Encounter (Signed)
Walk in pt Form " Dr.Silva Medical Clearance Form" Will Hold Onto  Until Lauren back In Office Thursday (08/15/12)  08/14/12/KM

## 2012-10-01 ENCOUNTER — Telehealth: Payer: Self-pay | Admitting: Cardiology

## 2012-10-01 NOTE — Telephone Encounter (Signed)
I cannot locate this form in the pt's chart.  I contacted Dr Rica Records office and requested another form be faxed to our office in regards to dental procedure.

## 2012-10-01 NOTE — Telephone Encounter (Signed)
Follow Up     Pt states she dropped up a release form for a procedure in May for cardiologist to sign. Pt states she needs it for a deep root clean and is now following up.

## 2012-10-02 NOTE — Telephone Encounter (Signed)
Spoke with pt and told her Dr. Clifton James had completed paperwork and that I would fax to Drs Edward Jolly and Edward Jolly. Pt is aware she needs antibiotics prior to dental work and she states the dentist always prescribes this.

## 2012-10-02 NOTE — Telephone Encounter (Signed)
Form obtained and I will forward this message to [facility] RN/Dr Clifton James to address since Dr Riley Kill has retired.

## 2013-01-06 ENCOUNTER — Other Ambulatory Visit (HOSPITAL_COMMUNITY): Payer: Self-pay | Admitting: Family Medicine

## 2013-01-06 DIAGNOSIS — Z1231 Encounter for screening mammogram for malignant neoplasm of breast: Secondary | ICD-10-CM

## 2013-01-08 ENCOUNTER — Ambulatory Visit (HOSPITAL_COMMUNITY)
Admission: RE | Admit: 2013-01-08 | Discharge: 2013-01-08 | Disposition: A | Discharge status: Home / Self Care | Payer: Medicaid Other | Source: Ambulatory Visit | Attending: Family Medicine | Admitting: Family Medicine

## 2013-01-08 DIAGNOSIS — Z1231 Encounter for screening mammogram for malignant neoplasm of breast: Secondary | ICD-10-CM | POA: Insufficient documentation

## 2013-05-13 ENCOUNTER — Encounter (INDEPENDENT_AMBULATORY_CARE_PROVIDER_SITE_OTHER): Payer: Self-pay

## 2013-05-13 ENCOUNTER — Ambulatory Visit (INDEPENDENT_AMBULATORY_CARE_PROVIDER_SITE_OTHER): Payer: Medicaid Other | Admitting: Cardiovascular Disease

## 2013-05-13 ENCOUNTER — Encounter: Payer: Self-pay | Admitting: Cardiovascular Disease

## 2013-05-13 VITALS — BP 130/70 | HR 74 | Ht 64.0 in | Wt 217.0 lb

## 2013-05-13 DIAGNOSIS — K219 Gastro-esophageal reflux disease without esophagitis: Secondary | ICD-10-CM

## 2013-05-13 DIAGNOSIS — I2510 Atherosclerotic heart disease of native coronary artery without angina pectoris: Secondary | ICD-10-CM

## 2013-05-13 DIAGNOSIS — Z9889 Other specified postprocedural states: Secondary | ICD-10-CM

## 2013-05-13 DIAGNOSIS — I10 Essential (primary) hypertension: Secondary | ICD-10-CM

## 2013-05-13 DIAGNOSIS — F17201 Nicotine dependence, unspecified, in remission: Secondary | ICD-10-CM

## 2013-05-13 DIAGNOSIS — E785 Hyperlipidemia, unspecified: Secondary | ICD-10-CM

## 2013-05-13 DIAGNOSIS — Z87891 Personal history of nicotine dependence: Secondary | ICD-10-CM

## 2013-05-13 MED ORDER — PANTOPRAZOLE SODIUM 40 MG PO TBEC: 40.0000 mg | DELAYED_RELEASE_TABLET | Freq: Every day | ORAL | Status: DC

## 2013-05-13 NOTE — Patient Instructions (Signed)
Your physician wants you to follow-up in:  6 months. You will receive a reminder letter in the mail two months in advance. If you don't receive a letter, please call our office to schedule the follow-up appointment.  Your physician has recommended you make the following change in your medication: Start Protonix 40 mg by mouth daily

## 2013-05-13 NOTE — Progress Notes (Signed)
History of Present Illness: 43 yo female with history of CAD s/p 4VCABG 2011, DM, HTN, HLD, MVP who is here today for cardiac follow up. She has been followed by Dr. [name]. Cardiac cath 04/01/09 per Dr. 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 Also severe MR. She underwent a CABG x4 (LIMA to LAD, SVG to subbranch of ramus intermediate, SVG to lateral subbranch of ramus intermediate, SVG to distal RCA) and mitral valve repair 04/07/09.  Last echo 11/08/11 with normal LV function, stable mitral valve repair.   She is here today for follow up. She tells me that she has done well until 4 weeks ago when she began to have some burning in her neck after eating. No exertional SOB or chest pain. Overall feeling well. Pain is in her neck with swallowing, worsened after meals, better when she drinks water.   Primary Care Physician: Dr. Ellsworth Lennox  Last Lipid Profile: Followed in primary care  Past Medical History  Diagnosis Date  . Diabetes mellitus   . High cholesterol   . MI (myocardial infarction)   . MRSA (methicillin resistant Staphylococcus aureus)   . MVP (mitral valve prolapse)   . Headache, migraine   . History of blood transfusion   . H/O emotional problems   . CHF (congestive heart failure)   . Hypertension   . Neuromuscular disorder   . Heart attack   . Sebaceous cyst   . Neuropathy     Past Surgical History  Procedure Laterality Date  . Coronary artery bypass graft  with valve repair  . Abdominal hysterectomy    . Skin graft      Current Outpatient Prescriptions  Medication Sig Dispense Refill  . aspirin 81 MG tablet Take 325 mg by mouth daily.       . Azelaic Acid (FINACEA) 15 % cream Apply 1 application topically 2 (two) times daily. After skin is thoroughly washed and patted dry, gently but thoroughly massage a thin film of azelaic acid cream into the affected area  twice daily, in the morning and evening.      Marland Kitchen doxycycline (DORYX) 100 MG DR capsule Take 100 mg by mouth 2 (two) times daily.      . fenofibrate (TRICOR) 48 MG tablet Take 48 mg by mouth daily.      Marland Kitchen gabapentin (NEURONTIN) 600 MG tablet Take 600 mg by mouth daily.      . insulin aspart (NOVOLOG FLEXPEN) 100 UNIT/ML injection Inject 10-12 Units into the skin 2 (two) times daily. 10 units at breakfast and 12 units at dinner      . insulin glargine (LANTUS SOLOSTAR) 100 UNIT/ML injection Inject 28 Units into the skin at bedtime.      Marland Kitchen lisinopril (PRINIVIL,ZESTRIL) 10 MG tablet Take 10 mg by mouth daily.      . metoprolol tartrate (LOPRESSOR) 25 MG tablet Take 12.5 mg by mouth 2 (two) times daily.      . metroNIDAZOLE (METROGEL) 1 % gel Apply 1 application topically 2 (two) times daily.      Marland Kitchen oxyCODONE-acetaminophen (PERCOCET) 10-325 MG per tablet Take 1 tablet by mouth 3 (three) times daily.      . simvastatin (ZOCOR) 40 MG tablet Take 40 mg by mouth at bedtime.       No current facility-administered medications for this visit.    Allergies  Allergen Reactions  . Metformin  Diarrhea and Nausea And Vomiting    History   Social History  . Marital Status: Divorced    Spouse Name: N/A    Number of Children: 2  . Years of Education: N/A   Occupational History  . Disability    Social History Main Topics  . Smoking status: Former Smoker -- 1.00 packs/day for 28 years    Types: Cigarettes    Quit date: 03/31/2009  . Smokeless tobacco: Never Used  . Alcohol Use: No  . Drug Use: No  . Sexual Activity: Yes    Birth Control/ Protection: Surgical     Comment: partial hysterectomy   Other Topics Concern  . Not on file   Social History Narrative  . No narrative on file    Family History  Problem Relation Age of Onset  . Emphysema Maternal Grandmother   . Heart disease Father   . Asthma Father   . Hypertension Father   . Diabetes Father   . Heart disease Mother   .  Hypertension Mother   . Diabetes Mother   . Migraines Mother   . Stroke Mother   . Hypertension Brother   . Diabetes Brother     Review of Systems:  As stated in the HPI and otherwise negative.   BP 130/70  Pulse 74  Ht 5\' 4"  (1.626 m)  Wt 217 lb (98.431 kg)  BMI 37.23 kg/m2  Physical Examination: General: Well developed, well nourished, NAD HEENT: OP clear, mucus membranes moist SKIN: warm, dry. No rashes. Neuro: No focal deficits Musculoskeletal: Muscle strength 5/5 all ext Psychiatric: Mood and affect normal Neck: No JVD, no carotid bruits, no thyromegaly, no lymphadenopathy. Lungs:Clear bilaterally, no wheezes, rhonci, crackles Cardiovascular: Regular rate and rhythm. No murmurs, gallops or rubs. Abdomen:Soft. Bowel sounds present. Non-tender.  Extremities: No lower extremity edema. Pulses are 2 + in the bilateral DP/PT.  EKG: NSR, rate 74 bpm.   Echo 11/08/11: Left ventricle: The cavity size was normal. Wall thickness was normal. Systolic function was normal. The estimated ejection fraction was in the range of 55% to 60%. Wall motion was normal; there were no regional wall motion abnormalities. Features are consistent with a pseudonormal left ventricular filling pattern, with concomitant abnormal relaxation and increased filling pressure (grade 2 diastolic dysfunction). - Mitral valve: Normal appearing mitral valve repair A prosthesis was present and functioning normally. The prosthesis had a normal range of motion. The sewing ring appeared normal, had no rocking motion, and showed no evidence of dehiscence. The findings are consistent with mild stenosis. - Atrial septum: No defect or patent foramen ovale was identified.  Assessment and Plan:   1. CAD: She is stable post CABG. Continue ASA, statin, beta blocker.     2. HYPERLIPIDEMIA: Continue statin. Lipids followed in primary care.    3. HYPERTENSION: BP controlled. No changes.   4. TOBACCO ABUSE, in  remission: She stopped smoking in 2010.   5. MITRAL REGURGITATION: Mitral valve repair stable by echo 2013.   6. GERD: Will start PPI for possible GERD. If no resolution in symptoms, may need GI referral for endoscopy.

## 2013-05-29 ENCOUNTER — Ambulatory Visit: Payer: Medicaid Other | Admitting: Cardiovascular Disease

## 2013-11-11 ENCOUNTER — Encounter: Payer: Self-pay | Admitting: Cardiovascular Disease

## 2013-11-11 ENCOUNTER — Ambulatory Visit (INDEPENDENT_AMBULATORY_CARE_PROVIDER_SITE_OTHER): Payer: Medicaid Other | Admitting: Cardiovascular Disease

## 2013-11-11 VITALS — BP 120/80 | HR 64 | Ht 64.0 in | Wt 204.0 lb

## 2013-11-11 DIAGNOSIS — K219 Gastro-esophageal reflux disease without esophagitis: Secondary | ICD-10-CM

## 2013-11-11 DIAGNOSIS — F17201 Nicotine dependence, unspecified, in remission: Secondary | ICD-10-CM

## 2013-11-11 DIAGNOSIS — E785 Hyperlipidemia, unspecified: Secondary | ICD-10-CM

## 2013-11-11 DIAGNOSIS — I10 Essential (primary) hypertension: Secondary | ICD-10-CM

## 2013-11-11 DIAGNOSIS — Z87891 Personal history of nicotine dependence: Secondary | ICD-10-CM

## 2013-11-11 DIAGNOSIS — Z9889 Other specified postprocedural states: Secondary | ICD-10-CM

## 2013-11-11 DIAGNOSIS — I2510 Atherosclerotic heart disease of native coronary artery without angina pectoris: Secondary | ICD-10-CM

## 2013-11-11 NOTE — Progress Notes (Signed)
History of Present Illness: 43 yo female with history of CAD s/p 4VCABG 2011, DM, HTN, HLD, MVP who is here today for cardiac follow up. She has been followed by Dr. [name]. Cardiac cath 04/01/09 per Dr. 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 Also severe MR. She underwent a CABG x4 (LIMA to LAD, SVG to subbranch of ramus intermediate, SVG to lateral subbranch of ramus intermediate, SVG to distal RCA) and mitral valve repair 04/07/09.  Last echo 11/08/11 with normal LV function, stable mitral valve repair. Last seen in our office February 2015 and had symptoms of burning in neck after meals. PPI started.   She is here today for follow up. She has been feeling great. No chest pain or SOB. No lower ext edema.   Primary Care Physician: Dr. Ellsworth Lennox  Last Lipid Profile: Followed in primary care  Past Medical History  Diagnosis Date  . Diabetes mellitus   . High cholesterol   . MI (myocardial infarction)   . MRSA (methicillin resistant Staphylococcus aureus)   . MVP (mitral valve prolapse)   . Headache, migraine   . History of blood transfusion   . H/O emotional problems   . CHF (congestive heart failure)   . Hypertension   . Neuromuscular disorder   . Heart attack   . Sebaceous cyst   . Neuropathy     Past Surgical History  Procedure Laterality Date  . Coronary artery bypass graft  with valve repair  . Abdominal hysterectomy    . Skin graft      Current Outpatient Prescriptions  Medication Sig Dispense Refill  . aspirin 81 MG tablet Take 325 mg by mouth daily.       . Azelaic Acid (FINACEA) 15 % cream Apply 1 application topically 2 (two) times daily. After skin is thoroughly washed and patted dry, gently but thoroughly massage a thin film of azelaic acid cream into the affected area twice daily, in the morning and evening.      . fenofibrate (TRICOR) 48 MG tablet Take  48 mg by mouth daily.      Marland Kitchen gabapentin (NEURONTIN) 600 MG tablet Take 600 mg by mouth daily.      . insulin aspart (NOVOLOG FLEXPEN) 100 UNIT/ML injection Inject 10-12 Units into the skin 2 (two) times daily. 10 units at breakfast and 12 units at dinner      . insulin glargine (LANTUS SOLOSTAR) 100 UNIT/ML injection Inject 28 Units into the skin at bedtime.      Marland Kitchen lisinopril (PRINIVIL,ZESTRIL) 10 MG tablet Take 10 mg by mouth daily.      . metoprolol tartrate (LOPRESSOR) 25 MG tablet Take 12.5 mg by mouth 2 (two) times daily.      . metroNIDAZOLE (METROGEL) 1 % gel Apply 1 application topically 2 (two) times daily.      . Naloxone HCl (EVZIO) 0.4 MG/0.4ML SOAJ Inject as directed as needed.      Marland Kitchen oxyCODONE-acetaminophen (PERCOCET) 10-325 MG per tablet Take 1 tablet by mouth 3 (three) times daily.      . pantoprazole (PROTONIX) 40 MG tablet Take 1 tablet (40 mg total) by mouth daily.  30 tablet  11  . simvastatin (ZOCOR) 40 MG tablet Take 40 mg by mouth at bedtime.       No current facility-administered medications for this visit.    Allergies  Allergen Reactions  .  Metformin Diarrhea and Nausea And Vomiting    History   Social History  . Marital Status: Divorced    Spouse Name: N/A    Number of Children: 2  . Years of Education: N/A   Occupational History  . Disability    Social History Main Topics  . Smoking status: Former Smoker -- 1.00 packs/day for 28 years    Types: Cigarettes    Quit date: 03/31/2009  . Smokeless tobacco: Never Used  . Alcohol Use: No  . Drug Use: No  . Sexual Activity: Yes    Birth Control/ Protection: Surgical     Comment: partial hysterectomy   Other Topics Concern  . Not on file   Social History Narrative  . No narrative on file    Family History  Problem Relation Age of Onset  . Emphysema Maternal Grandmother   . Heart disease Father   . Asthma Father   . Hypertension Father   . Diabetes Father   . Heart disease Mother   .  Hypertension Mother   . Diabetes Mother   . Migraines Mother   . Stroke Mother   . Hypertension Brother   . Diabetes Brother     Review of Systems:  As stated in the HPI and otherwise negative.   BP 120/80  Pulse 64  Ht 5\' 4"  (1.626 m)  Wt 204 lb (92.534 kg)  BMI 35.00 kg/m2  Physical Examination: General: Well developed, well nourished, NAD HEENT: OP clear, mucus membranes moist SKIN: warm, dry. No rashes. Neuro: No focal deficits Musculoskeletal: Muscle strength 5/5 all ext Psychiatric: Mood and affect normal Neck: No JVD, no carotid bruits, no thyromegaly, no lymphadenopathy. Lungs:Clear bilaterally, no wheezes, rhonci, crackles Cardiovascular: Regular rate and rhythm. No murmurs, gallops or rubs. Abdomen:Soft. Bowel sounds present. Non-tender.  Extremities: No lower extremity edema. Pulses are 2 + in the bilateral DP/PT.  EKG: NSR, rate 74 bpm.   Echo 11/08/11: Left ventricle: The cavity size was normal. Wall thickness was normal. Systolic function was normal. The estimated ejection fraction was in the range of 55% to 60%. Wall motion was normal; there were no regional wall motion abnormalities. Features are consistent with a pseudonormal left ventricular filling pattern, with concomitant abnormal relaxation and increased filling pressure (grade 2 diastolic dysfunction). - Mitral valve: Normal appearing mitral valve repair A prosthesis was present and functioning normally. The prosthesis had a normal range of motion. The sewing ring appeared normal, had no rocking motion, and showed no evidence of dehiscence. The findings are consistent with mild stenosis. - Atrial septum: No defect or patent foramen ovale was identified.  Assessment and Plan:   1. CAD: She is stable post CABG. Continue ASA, statin, beta blocker.     2. HYPERLIPIDEMIA: Continue statin. Lipids followed in primary care.    3. HYPERTENSION: BP controlled. No changes.   4. TOBACCO ABUSE, in  remission: She stopped smoking in 2010.   5. MITRAL REGURGITATION: Mitral valve repair stable by echo 2013.   6. GERD: Controlled. Continue PPI

## 2013-11-11 NOTE — Patient Instructions (Signed)
Your physician wants you to follow-up in:  12 months.  You will receive a reminder letter in the mail two months in advance. If you don't receive a letter, please call our office to schedule the follow-up appointment.   

## 2013-12-11 ENCOUNTER — Other Ambulatory Visit (HOSPITAL_COMMUNITY): Payer: Self-pay | Admitting: Family Medicine

## 2013-12-11 DIAGNOSIS — Z1231 Encounter for screening mammogram for malignant neoplasm of breast: Secondary | ICD-10-CM

## 2014-01-09 ENCOUNTER — Ambulatory Visit (HOSPITAL_COMMUNITY)
Admission: RE | Admit: 2014-01-09 | Discharge: 2014-01-09 | Disposition: A | Discharge status: Home / Self Care | Payer: Medicaid Other | Source: Ambulatory Visit | Attending: Family Medicine | Admitting: Family Medicine

## 2014-01-09 DIAGNOSIS — Z1231 Encounter for screening mammogram for malignant neoplasm of breast: Secondary | ICD-10-CM | POA: Diagnosis not present

## 2014-02-02 ENCOUNTER — Encounter: Payer: Self-pay | Admitting: Cardiovascular Disease

## 2014-06-01 ENCOUNTER — Other Ambulatory Visit: Payer: Self-pay | Admitting: Family Medicine

## 2014-06-01 ENCOUNTER — Ambulatory Visit
Admission: RE | Admit: 2014-06-01 | Discharge: 2014-06-01 | Disposition: A | Discharge status: Home / Self Care | Payer: Medicaid Other | Source: Ambulatory Visit | Attending: Family Medicine | Admitting: Family Medicine

## 2014-06-01 DIAGNOSIS — M79674 Pain in right toe(s): Secondary | ICD-10-CM

## 2014-06-03 ENCOUNTER — Encounter: Payer: Self-pay | Admitting: Podiatry

## 2014-06-03 ENCOUNTER — Ambulatory Visit (INDEPENDENT_AMBULATORY_CARE_PROVIDER_SITE_OTHER): Payer: Medicaid Other | Admitting: Podiatry

## 2014-06-03 VITALS — BP 126/80 | HR 62 | Temp 98.3°F | Resp 12

## 2014-06-03 DIAGNOSIS — L03031 Cellulitis of right toe: Secondary | ICD-10-CM

## 2014-06-03 MED ORDER — DOXYCYCLINE HYCLATE 100 MG PO TABS: 100.0000 mg | ORAL_TABLET | Freq: Two times a day (BID) | ORAL | Status: DC

## 2014-06-03 NOTE — Progress Notes (Signed)
   Subjective:    Patient ID: Elizabeth Park, female    DOB: 09-19-1970, 44 y.o.   MRN: [id number]  HPI  N-SORE, REDNESS, SWOLLEN L-RT FOOT 3RD TOE D-4 DAYS O-SLOWLY C-WORSE A-PRESSURE T-NONE  Patient states that she went to Dr.Blount approximately 2 days ago and according to the patient he ordered uric acid test  and patient does not know results Patient states that she's had multiple MRSA infections and has responded to doxycycline  Review of Systems  Skin: Positive for color change.  All other systems reviewed and are negative.      Objective:   Physical Exam  Orientated 3  Vascular: DP pulses 2/4 bilaterally PT pulses 2/4 bilaterally Capillary reflex immediate bilaterally  Neurological: Sensation to 10 g monofilament wire intact 5/5 bilaterally Vibratory sensation intact bilaterally Ankle reflex equal and reactive bilaterally  Dermatological: Erythema from distal interphalangeal joint on the third right toe. There is no active drainage, however the nail plate somewhat macerated. The toe extremely tender to palpation and his rectus in appearance  Musculoskeletal: No deformities noted       Assessment & Plan:   Assessment: Paronychia third right toe with a history of multiple MRSA infections  Plan: Rx doxycycline 100 mg by mouth twice a day 10 days Patient advised if she develops any sudden fever, pain, redness present to ER Generalize information about diabetic foot care provided today  Reappoint 14 days

## 2014-06-03 NOTE — Patient Instructions (Signed)
Begin taking doxycycline 100 mg by mouth 1 twice a day on an empty stomach If you develop sudden increase in pain, swelling, redness in the right foot present to ER Diabetes and Foot Care Diabetes may cause you to have problems because of poor blood supply (circulation) to your feet and legs. This may cause the skin on your feet to become thinner, break easier, and heal more slowly. Your skin may become dry, and the skin may peel and crack. You may also have nerve damage in your legs and feet causing decreased feeling in them. You may not notice minor injuries to your feet that could lead to infections or more serious problems. Taking care of your feet is one of the most important things you can do for yourself.  HOME CARE INSTRUCTIONS  Wear shoes at all times, even in the house. Do not go barefoot. Bare feet are easily injured.  Check your feet daily for blisters, cuts, and redness. If you cannot see the bottom of your feet, use a mirror or ask someone for help.  Wash your feet with warm water (do not use hot water) and mild soap. Then pat your feet and the areas between your toes until they are completely dry. Do not soak your feet as this can dry your skin.  Apply a moisturizing lotion or petroleum jelly (that does not contain alcohol and is unscented) to the skin on your feet and to dry, brittle toenails. Do not apply lotion between your toes.  Trim your toenails straight across. Do not dig under them or around the cuticle. File the edges of your nails with an emery board or nail file.  Do not cut corns or calluses or try to remove them with medicine.  Wear clean socks or stockings every day. Make sure they are not too tight. Do not wear knee-high stockings since they may decrease blood flow to your legs.  Wear shoes that fit properly and have enough cushioning. To break in new shoes, wear them for just a few hours a day. This prevents you from injuring your feet. Always look in your shoes  before you put them on to be sure there are no objects inside.  Do not cross your legs. This may decrease the blood flow to your feet.  If you find a minor scrape, cut, or break in the skin on your feet, keep it and the skin around it clean and dry. These areas may be cleansed with mild soap and water. Do not cleanse the area with peroxide, alcohol, or iodine.  When you remove an adhesive bandage, be sure not to damage the skin around it.  If you have a wound, look at it several times a day to make sure it is healing.  Do not use heating pads or hot water bottles. They may burn your skin. If you have lost feeling in your feet or legs, you may not know it is happening until it is too late.  Make sure your health care provider performs a complete foot exam at least annually or more often if you have foot problems. Report any cuts, sores, or bruises to your health care provider immediately. SEEK MEDICAL CARE IF:   You have an injury that is not healing.  You have cuts or breaks in the skin.  You have an ingrown nail.  You notice redness on your legs or feet.  You feel burning or tingling in your legs or feet.  You have pain  or cramps in your legs and feet.  Your legs or feet are numb.  Your feet always feel cold. SEEK IMMEDIATE MEDICAL CARE IF:   There is increasing redness, swelling, or pain in or around a wound.  There is a red line that goes up your leg.  Pus is coming from a wound.  You develop a fever or as directed by your health care provider.  You notice a bad smell coming from an ulcer or wound. Document Released: 03/17/2000 Document Revised: 11/20/2012 Document Reviewed: 08/27/2012 [facility] Patient Information 2015 [place], [place]. This information is not intended to replace advice given to you by your health care provider. Make sure you discuss any questions you have with your health care provider.

## 2014-06-17 ENCOUNTER — Encounter: Payer: Self-pay | Admitting: Podiatry

## 2014-06-17 ENCOUNTER — Ambulatory Visit (INDEPENDENT_AMBULATORY_CARE_PROVIDER_SITE_OTHER): Payer: Medicaid Other | Admitting: Podiatry

## 2014-06-17 VITALS — BP 131/86 | HR 74 | Resp 12

## 2014-06-17 DIAGNOSIS — L03031 Cellulitis of right toe: Secondary | ICD-10-CM

## 2014-06-17 NOTE — Patient Instructions (Signed)
Apply topical antibiotic ointment and a Band-Aid to the third right toe 7 days  Reappoint if needed

## 2014-06-18 NOTE — Progress Notes (Signed)
Patient ID: Elizabeth Park, female   DOB: 01-31-1971, 44 y.o.   MRN: [id number]  Subjective: This patient presents for follow-up care for paronychia of the third right toe from the visit of 06/03/2014. Patient  completed 10 days of doxycycline 100 mg twice a day. She denies any complaints from the medication. She notices that the swelling has reduced significantly, however, she noticed some residual redness in the toe Please note patient has history of recurrent MRSA infections that have all responded previously to doxycycline.  Review of systems: Skin: Positive for color change All other systems reviewed are negative  Objective: Orientated 3  Vascular: DP and PT pulses 2/4 bilaterally Capillary reflex immediate bilaterally  Neurological: Ankle reflex equal and reactive bilaterally Vibratory sensation intact bilaterally Sensation to 10 g monofilament wire intact 5/5 bilaterally  Dermatological: The distal third right toe has low-grade edema with scaling skin with the nail partially detached from the nailbed. There is no drainage, warmth, malodor  Musculoskeletal: No deformities noted  Assessment: Resolved paronychia third right toe Partially detached third right toenail  Plan: The third right toenail was debrided without any bleeding. (The underlying nailbed demonstrate no drainage, erythema or warmth)  Patient was advised to moisturize third right toenail bed with Vaseline and apply a Band-Aid daily 7 days  Reappoint at patient's request

## 2014-08-10 ENCOUNTER — Telehealth: Payer: Self-pay | Admitting: Cardiovascular Disease

## 2014-08-10 NOTE — Telephone Encounter (Signed)
Spoke with patient who called to ask if Dr. Angelena Form will need to see her or order any tests prior to oral surgery.  Patient states she has an impacted wisdom tooth and was told by oral surgeon that she will need general anesthesia for the extraction of 3 teeth.  I advised patient that Dr. Angelena Form is working in the hospital today and that his primary nurse, Enis Slipper, RN is off today but that I will send message through the computer.  I advised that someone from our office will call her back with his advice.

## 2014-08-10 NOTE — Telephone Encounter (Signed)
I spoke with patient and attempted to schedule PA/NP appointment.  I advised patient that it is likely that appointment will be next week as I am unable to find any open spots for this week.  Patient states next week is fine and I advised her that someone from our office will call her to schedule.  Patient verbalized understanding and agreement.

## 2014-08-10 NOTE — Telephone Encounter (Signed)
New message    Patient calling stating the dentist office fax over 3 clearance regarding tooth extraction.

## 2014-08-10 NOTE — Telephone Encounter (Signed)
She will need an appt with an office PA or NP to give clearance for the procedure since general anesthesia will be used. Thanks, chris

## 2014-08-11 NOTE — Progress Notes (Signed)
Cardiology Office Note   Date:  08/11/2014   ID:  Elizabeth Park, DOB [birth year], MRN [id number]  PCP:  Elizabeth Palau, MD  Cardiologist:  Dr. Julianne Handler   Pre-operative clearance for oral surgery    History of Present Illness: Elizabeth Park is a 44 y.o. female with a history of CAD s/p 4VCABG and MVR (2011), DM, HTN, HLD, and MVP who is here pre-operative clearance.   Cardiac cath 04/01/09 per Dr. 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 Also severe MR. She underwent a CABG x4 (LIMA to LAD, SVG to subbranch of ramus intermediate, SVG to lateral subbranch of ramus intermediate, SVG to distal RCA) and mitral valve repair 04/07/09. Last echo 11/08/11 with normal LV function, stable mitral valve repair. Seen in 05/2013 and had symptoms of burning in neck after meals. PPI started. She was last seen by Dr. Julianne Handler in 11/2013 and felt to be doing well from a cardiac standpoint at that time.  She was added onto my schedule today for pre-operative clearance prior to planned dental surgery. Patient states she has an impacted wisdom tooth and was told by oral surgeon that she will need general anesthesia for the extraction of 3 teeth.  Prior to heart heart attack in 2011 she had GERD like chest pain. She has not had anything similar. She had  multiple infections in her sternal wiring after her CABG and has has some random twinges of chest "hooking" sensations right at the incision site. This is not related to exertion or associated with SOB, diaphoresis or nausea. She has been working out 5x a week recently as she is trying to loose weight for vacation. She sometimes gets some mild SOB when she "pushes herself too hard." She has never had any exertional chest discomfort. Otherwise she is feeling good. No palpitations, dizziness or blood in her stool or urine. No orthopnea, PND or LE edema. She is  complaint with all her medicines.     Past Medical History  Diagnosis Date  . Diabetes mellitus   . High cholesterol   . MI (myocardial infarction)   . MRSA (methicillin resistant Staphylococcus aureus)   . MVP (mitral valve prolapse)   . Headache, migraine   . History of blood transfusion   . H/O emotional problems   . CHF (congestive heart failure)   . Hypertension   . Neuromuscular disorder   . Heart attack   . Sebaceous cyst   . Neuropathy     Past Surgical History  Procedure Laterality Date  . Coronary artery bypass graft  with valve repair  . Abdominal hysterectomy    . Skin graft       Current Outpatient Prescriptions  Medication Sig Dispense Refill  . aspirin 81 MG tablet Take 325 mg by mouth daily.     . Azelaic Acid (FINACEA) 15 % cream Apply 1 application topically 2 (two) times daily. After skin is thoroughly washed and patted dry, gently but thoroughly massage a thin film of azelaic acid cream into the affected area twice daily, in the morning and evening.    Marland Kitchen doxycycline (VIBRA-TABS) 100 MG tablet Take 1 tablet (100 mg total) by mouth 2 (two) times daily. 20 tablet 0  . fenofibrate (TRICOR) 48 MG tablet Take 48 mg by mouth daily.    Marland Kitchen gabapentin (NEURONTIN) 600 MG tablet Take 600 mg by mouth daily.    Marland Kitchen  insulin aspart (NOVOLOG FLEXPEN) 100 UNIT/ML injection Inject 10-12 Units into the skin 2 (two) times daily. 10 units at breakfast and 12 units at dinner    . insulin glargine (LANTUS SOLOSTAR) 100 UNIT/ML injection Inject 28 Units into the skin at bedtime.    Marland Kitchen lisinopril (PRINIVIL,ZESTRIL) 10 MG tablet Take 10 mg by mouth daily.    . metoprolol tartrate (LOPRESSOR) 25 MG tablet Take 12.5 mg by mouth 2 (two) times daily.    . metroNIDAZOLE (METROGEL) 1 % gel Apply 1 application topically 2 (two) times daily.    . Naloxone HCl (EVZIO) 0.4 MG/0.4ML SOAJ Inject as directed as needed.    Marland Kitchen oxyCODONE-acetaminophen (PERCOCET) 10-325 MG per tablet Take 1 tablet by  mouth 3 (three) times daily.    . pantoprazole (PROTONIX) 40 MG tablet Take 1 tablet (40 mg total) by mouth daily. 30 tablet 11  . simvastatin (ZOCOR) 40 MG tablet Take 40 mg by mouth at bedtime.     No current facility-administered medications for this visit.    Allergies:   Metformin    Social History:  The patient  reports that she quit smoking about 5 years ago. Her smoking use included Cigarettes. She has a 28 pack-year smoking history. She has never used smokeless tobacco. She reports that she does not drink alcohol or use illicit drugs.   Family History:  The patient'sfamily history includes Asthma in her father; Diabetes in her brother, father, and mother; Emphysema in her maternal grandmother; Heart disease in her father and mother; Hypertension in her brother, father, and mother; Migraines in her mother; Stroke in her mother.    ROS:  Please see the history of present illness.   Otherwise, review of systems are positive for none.   All other systems are reviewed and negative.    PHYSICAL EXAM: VS:  There were no vitals taken for this visit. , BMI There is no weight on file to calculate BMI. GEN: Well nourished, well developed, in no acute distress. obsese HEENT: normal Neck: no JVD, carotid bruits, or masses Cardiac: RRR; no murmurs, rubs, or gallops,no edema  Respiratory:  clear to auscultation bilaterally, normal work of breathing GI: soft, nontender, nondistended, + BS MS: no deformity or atrophy Skin: warm and dry, no rash Neuro:  Strength and sensation are intact Psych: euthymic mood, full affect   EKG:  EKG is ordered today. The ekg ordered today demonstrates NSR HR 64. TWI in v1 and v2 stable from previous tracing.    Recent Labs: No results found for requested labs within last 365 days.     Wt Readings from Last 3 Encounters:  11/11/13 204 lb (92.534 kg)  05/13/13 217 lb (98.431 kg)  05/24/12 218 lb (98.884 kg)      Other studies Reviewed: Additional  studies/ records that were reviewed today include: 2D ECHO. Review of the above records demonstrates:  -- 2D ECHO (11/08/11): EF 55% to 60%. No RWMA. G2DD, normal appearing mitral valve repair- a prosthesis was present and functioning normally. The findings are consistent with mild stenosis.   ASSESSMENT AND PLAN:  Elizabeth Park is a 44 y.o. female with a history of CAD s/p 4VCABG and MVR (2011), DM, HTN, HLD, and MVP who is here pre-operative clearance.   1. CAD: She is stable post CABG. Continue ASA, statin, beta blocker.   2. HYPERLIPIDEMIA: Continue statin. Lipids followed in primary care.   3. HYPERTENSION: BP controlled at 132/86. Continue lisinopril 10mg  and metoprolol 12.5mg   BID.  4. TOBACCO ABUSE, in remission: She stopped smoking in 2010.   5. MITRAL REGURGITATION: Mitral valve repair stable by echo 2013.   6. GERD: Controlled. Continue PPI   DISPO: I spoke with her primary cardiologist, Dr. Julianne Handler. We both feel that the patient is stable from a cardiac standpoint and can proceed with oral surgery for wisdom tooth extraction under general anaesthesia without further cardiac testing. I will forward this to Dr. Hoyt Koch.   Current medicines are reviewed at length with the patient today.  The patient does not have concerns regarding medicines.  The following changes have been made:  no change  Labs/ tests ordered today include:  No orders of the defined types were placed in this encounter.     Disposition:   FU with Dr. Julianne Handler in August for yearly follow up. She knows to call us if anything arises in the meantime.   Renea Ee  08/11/2014 1:37 PM    [facility] [place], [place], [place]  [address] Phone: [phone]; Fax: [phone]

## 2014-08-11 NOTE — Telephone Encounter (Signed)
Left message to call back  

## 2014-08-11 NOTE — Telephone Encounter (Signed)
I spoke with pt and appt made for her to see K. Grandville Silos, [place] on Aug 12, 2014 at 8:00

## 2014-08-12 ENCOUNTER — Encounter: Payer: Self-pay | Admitting: Physician Assistant

## 2014-08-12 ENCOUNTER — Ambulatory Visit (INDEPENDENT_AMBULATORY_CARE_PROVIDER_SITE_OTHER): Payer: Medicaid Other | Admitting: Physician Assistant

## 2014-08-12 VITALS — BP 132/86 | HR 64 | Ht 64.0 in | Wt 202.2 lb

## 2014-08-12 DIAGNOSIS — Z01818 Encounter for other preprocedural examination: Secondary | ICD-10-CM | POA: Diagnosis not present

## 2014-08-12 NOTE — Patient Instructions (Signed)
Medication Instructions:   Your physician recommends that you continue on your current medications as directed. Please refer to the Current Medication list given to you today.  Labwork:   Testing/Procedures:   Follow-Up:  IN  AUGUST WITH DR [facility]   Any Other Special Instructions Will Be Listed Below (If Applicable).

## 2014-09-09 ENCOUNTER — Telehealth: Payer: Self-pay | Admitting: Cardiovascular Disease

## 2014-09-09 NOTE — Telephone Encounter (Signed)
DISPO: I spoke with her primary cardiologist, Dr. Julianne Handler. We both feel that the patient is stable from a cardiac standpoint and can proceed with oral surgery for wisdom tooth extraction under general anaesthesia without further cardiac testing. I will forward this to Dr. Hoyt Koch.   Renea Ee  08/11/2014 1:37 PM  [facility] [place], [place], [place] [address] Phone: [phone]; Fax: [phone]   Above statement in Curt Bears Thompson's note from 5/10.  Will fax to Dr. Lupita Leash office.

## 2014-09-09 NOTE — Telephone Encounter (Signed)
Request for surgical clearance:  1. What type of surgery is being performed? Oral Surgery         When is this surgery scheduled? Not scheduled     2. Name of physician performing surgery? Dr. Jodene Nam   3. What is your office phone and fax number? FAX [phone]  Comments: Pt was cleared per PA a few weeks ago for oral surgery. Please fax confirmation

## 2014-10-19 ENCOUNTER — Other Ambulatory Visit: Payer: Self-pay | Admitting: Cardiovascular Disease

## 2014-11-11 ENCOUNTER — Ambulatory Visit: Payer: Medicaid Other | Admitting: Cardiovascular Disease

## 2014-11-25 NOTE — Progress Notes (Signed)
Chief Complaint  Patient presents with  . Chest Pain     History of Present Illness: 44 yo female with history of CAD s/p 4VCABG 2011, DM, HTN, HLD, MVP who is here today for cardiac follow up. She has been followed by Dr. [name]. Cardiac cath 04/01/09 per Dr. 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 Also severe MR. She underwent a CABG x4 (LIMA to LAD, SVG to subbranch of ramus intermediate, SVG to lateral subbranch of ramus intermediate, SVG to distal RCA) and mitral valve repair 04/07/09.  Last echo 11/08/11 with normal LV function, stable mitral valve repair. Last seen in our office February 2015 and had symptoms of burning in neck after meals. PPI started.   She is here today for follow up. She has been feeling great. No chest pain or SOB. No lower ext edema. Occasional pain between breasts at surgical site.   Primary Care Physician: Dr. Kennon Holter  Last Lipid Profile: Followed in primary care  Past Medical History  Diagnosis Date  . Diabetes mellitus   . High cholesterol   . MI (myocardial infarction)   . MRSA (methicillin resistant Staphylococcus aureus)   . MVP (mitral valve prolapse)   . Headache, migraine   . History of blood transfusion   . H/O emotional problems   . CHF (congestive heart failure)   . Hypertension   . Neuromuscular disorder   . Heart attack   . Sebaceous cyst   . Neuropathy     Past Surgical History  Procedure Laterality Date  . Coronary artery bypass graft  with valve repair  . Abdominal hysterectomy    . Skin graft      Current Outpatient Prescriptions  Medication Sig Dispense Refill  . ALPRAZolam (XANAX) 0.5 MG tablet Take 0.5 mg by mouth 2 (two) times daily as needed for anxiety.    Marland Kitchen aspirin 81 MG tablet Take 81 mg by mouth daily.     . cyclobenzaprine (FLEXERIL) 10 MG tablet Take 10 mg by mouth 2 (two) times daily as needed for  muscle spasms.    . fenofibrate (TRICOR) 48 MG tablet Take 48 mg by mouth daily.    Marland Kitchen gabapentin (NEURONTIN) 600 MG tablet Take 600 mg by mouth daily.    . insulin aspart (NOVOLOG FLEXPEN) 100 UNIT/ML injection Inject 10-12 Units into the skin 3 (three) times daily with meals. 10 units at breakfast  10 units at lunch and 12 units at dinner    . insulin glargine (LANTUS SOLOSTAR) 100 UNIT/ML injection Inject 28 Units into the skin at bedtime.    Marland Kitchen lisinopril (PRINIVIL,ZESTRIL) 20 MG tablet Take 20 mg by mouth daily.    . metoprolol tartrate (LOPRESSOR) 25 MG tablet Take 12.5 mg by mouth 2 (two) times daily.    . Naloxone HCl (EVZIO) 0.4 MG/0.4ML SOAJ Inject as directed as needed (for possible overdose).     Marland Kitchen oxyCODONE-acetaminophen (PERCOCET) 10-325 MG per tablet Take 1 tablet by mouth 3 (three) times daily.    . pantoprazole (PROTONIX) 40 MG tablet TAKE 1 TABLET (40 MG TOTAL) BY MOUTH DAILY. 30 tablet 1  . simvastatin (ZOCOR) 40 MG tablet Take 40 mg by mouth at bedtime.     No current facility-administered medications for this visit.    Allergies  Allergen Reactions  . Metformin Diarrhea and Nausea And Vomiting    Social History   Social History  .  Marital Status: Divorced    Spouse Name: N/A  . Number of Children: 2  . Years of Education: N/A   Occupational History  . Disability    Social History Main Topics  . Smoking status: Former Smoker -- 1.00 packs/day for 28 years    Types: Cigarettes    Quit date: 03/31/2009  . Smokeless tobacco: Never Used  . Alcohol Use: No  . Drug Use: No  . Sexual Activity: Yes    Birth Control/ Protection: Surgical     Comment: partial hysterectomy   Other Topics Concern  . Not on file   Social History Narrative    Family History  Problem Relation Age of Onset  . Emphysema Maternal Grandmother   . Heart disease Father   . Asthma Father   . Hypertension Father   . Diabetes Father   . Heart disease Mother   . Hypertension Mother     . Diabetes Mother   . Migraines Mother   . Stroke Mother   . Hypertension Brother   . Diabetes Brother     Review of Systems:  As stated in the HPI and otherwise negative.   BP 128/76 mmHg  Pulse 67  Ht 5\' 4"  (1.626 m)  Wt 204 lb (92.534 kg)  BMI 35.00 kg/m2  Physical Examination: General: Well developed, well nourished, NAD HEENT: OP clear, mucus membranes moist SKIN: warm, dry. No rashes. Neuro: No focal deficits Musculoskeletal: Muscle strength 5/5 all ext Psychiatric: Mood and affect normal Neck: No JVD, no carotid bruits, no thyromegaly, no lymphadenopathy. Lungs:Clear bilaterally, no wheezes, rhonci, crackles Cardiovascular: Regular rate and rhythm. No murmurs, gallops or rubs. Abdomen:Soft. Bowel sounds present. Non-tender.  Extremities: No lower extremity edema. Pulses are 2 + in the bilateral DP/PT.  Echo 11/08/11: Left ventricle: The cavity size was normal. Wall thickness was normal. Systolic function was normal. The estimated ejection fraction was in the range of 55% to 60%. Wall motion was normal; there were no regional wall motion abnormalities. Features are consistent with a pseudonormal left ventricular filling pattern, with concomitant abnormal relaxation and increased filling pressure (grade 2 diastolic dysfunction). - Mitral valve: Normal appearing mitral valve repair A prosthesis was present and functioning normally. The prosthesis had a normal range of motion. The sewing ring appeared normal, had no rocking motion, and showed no evidence of dehiscence. The findings are consistent with mild stenosis. - Atrial septum: No defect or patent foramen ovale was Identified.  EKG:  EKG is not ordered today. The ekg ordered today demonstrates   Recent Labs: No results found for requested labs within last 365 days.   Lipid Panel  Wt Readings from Last 3 Encounters:  11/26/14 204 lb (92.534 kg)  08/12/14 202 lb 3.2 oz (91.717 kg)  11/11/13 204 lb (92.534  kg)     Other studies Reviewed: Additional studies/ records that were reviewed today include: . Review of the above records demonstrates:    Assessment and Plan:   1. CAD: She is stable post CABG. Continue ASA, statin, beta blocker.     2. HYPERLIPIDEMIA: Continue statin. Lipids followed in primary care.    3. HYPERTENSION: BP controlled. No changes.   4. TOBACCO ABUSE, in remission: She stopped smoking in 2010.   5. MITRAL REGURGITATION: Mitral valve repair stable by echo 2013. Repeat echo now.   6. GERD: Controlled. Continue PPI   Current medicines are reviewed at length with the patient today.  The patient does not have concerns regarding medicines.  The following changes have been made:  no change  Labs/ tests ordered today include:   Orders Placed This Encounter  Procedures  . Echocardiogram    Disposition:   FU with me in 12  months  Signed, Lauree Chandler, MD 11/26/2014 8:55 AM    [facility] [place], [place], [place]  [address] Phone: [phone]; Fax: [phone]

## 2014-11-26 ENCOUNTER — Ambulatory Visit (INDEPENDENT_AMBULATORY_CARE_PROVIDER_SITE_OTHER): Payer: Medicaid Other | Admitting: Cardiovascular Disease

## 2014-11-26 ENCOUNTER — Encounter: Payer: Self-pay | Admitting: Cardiovascular Disease

## 2014-11-26 VITALS — BP 128/76 | HR 67 | Ht 64.0 in | Wt 204.0 lb

## 2014-11-26 DIAGNOSIS — K219 Gastro-esophageal reflux disease without esophagitis: Secondary | ICD-10-CM

## 2014-11-26 DIAGNOSIS — I25810 Atherosclerosis of coronary artery bypass graft(s) without angina pectoris: Secondary | ICD-10-CM

## 2014-11-26 DIAGNOSIS — Z9889 Other specified postprocedural states: Secondary | ICD-10-CM

## 2014-11-26 DIAGNOSIS — I10 Essential (primary) hypertension: Secondary | ICD-10-CM

## 2014-11-26 DIAGNOSIS — F17201 Nicotine dependence, unspecified, in remission: Secondary | ICD-10-CM | POA: Diagnosis not present

## 2014-11-26 NOTE — Patient Instructions (Signed)
Medication Instructions:  Your physician recommends that you continue on your current medications as directed. Please refer to the Current Medication list given to you today.   Labwork: none  Testing/Procedures: Your physician has requested that you have an echocardiogram. Echocardiography is a painless test that uses sound waves to create images of your heart. It provides your doctor with information about the size and shape of your heart and how well your heart's chambers and valves are working. This procedure takes approximately one hour. There are no restrictions for this procedure.    Follow-Up: Your physician wants you to follow-up in: 12 months.  You will receive a reminder letter in the mail two months in advance. If you don't receive a letter, please call our office to schedule the follow-up appointment.   Any Other Special Instructions Will Be Listed Below (If Applicable).

## 2014-12-04 ENCOUNTER — Other Ambulatory Visit: Payer: Self-pay

## 2014-12-04 ENCOUNTER — Ambulatory Visit (HOSPITAL_COMMUNITY): Payer: Medicaid Other | Attending: Cardiology

## 2014-12-04 DIAGNOSIS — I25810 Atherosclerosis of coronary artery bypass graft(s) without angina pectoris: Secondary | ICD-10-CM

## 2014-12-04 DIAGNOSIS — I10 Essential (primary) hypertension: Secondary | ICD-10-CM | POA: Diagnosis not present

## 2014-12-04 DIAGNOSIS — Z9889 Other specified postprocedural states: Secondary | ICD-10-CM | POA: Diagnosis not present

## 2014-12-04 DIAGNOSIS — E119 Type 2 diabetes mellitus without complications: Secondary | ICD-10-CM | POA: Insufficient documentation

## 2014-12-04 DIAGNOSIS — E785 Hyperlipidemia, unspecified: Secondary | ICD-10-CM | POA: Diagnosis not present

## 2014-12-24 ENCOUNTER — Other Ambulatory Visit: Payer: Self-pay | Admitting: Cardiovascular Disease

## 2014-12-25 ENCOUNTER — Other Ambulatory Visit: Payer: Self-pay

## 2014-12-25 DIAGNOSIS — Z1231 Encounter for screening mammogram for malignant neoplasm of breast: Secondary | ICD-10-CM

## 2015-01-11 ENCOUNTER — Ambulatory Visit
Admission: RE | Admit: 2015-01-11 | Discharge: 2015-01-11 | Disposition: A | Discharge status: Home / Self Care | Payer: Medicaid Other | Source: Ambulatory Visit

## 2015-01-11 DIAGNOSIS — Z1231 Encounter for screening mammogram for malignant neoplasm of breast: Secondary | ICD-10-CM

## 2015-01-13 ENCOUNTER — Other Ambulatory Visit: Payer: Self-pay | Admitting: Family Medicine

## 2015-01-13 DIAGNOSIS — R928 Other abnormal and inconclusive findings on diagnostic imaging of breast: Secondary | ICD-10-CM

## 2015-01-20 ENCOUNTER — Ambulatory Visit
Admission: RE | Admit: 2015-01-20 | Discharge: 2015-01-20 | Disposition: A | Discharge status: Home / Self Care | Payer: Medicaid Other | Source: Ambulatory Visit | Attending: Family Medicine | Admitting: Family Medicine

## 2015-01-20 DIAGNOSIS — R928 Other abnormal and inconclusive findings on diagnostic imaging of breast: Secondary | ICD-10-CM

## 2015-03-16 DIAGNOSIS — M545 Low back pain: Secondary | ICD-10-CM | POA: Diagnosis not present

## 2015-05-03 DIAGNOSIS — G541 Lumbosacral plexus disorders: Secondary | ICD-10-CM | POA: Diagnosis not present

## 2015-05-03 DIAGNOSIS — M5442 Lumbago with sciatica, left side: Secondary | ICD-10-CM | POA: Diagnosis not present

## 2015-05-03 DIAGNOSIS — Z79891 Long term (current) use of opiate analgesic: Secondary | ICD-10-CM | POA: Diagnosis not present

## 2015-05-03 DIAGNOSIS — G894 Chronic pain syndrome: Secondary | ICD-10-CM | POA: Diagnosis not present

## 2015-05-03 DIAGNOSIS — M545 Low back pain: Secondary | ICD-10-CM | POA: Diagnosis not present

## 2015-05-03 DIAGNOSIS — G603 Idiopathic progressive neuropathy: Secondary | ICD-10-CM | POA: Diagnosis not present

## 2015-05-03 DIAGNOSIS — G890 Central pain syndrome: Secondary | ICD-10-CM | POA: Diagnosis not present

## 2015-05-21 DIAGNOSIS — I110 Hypertensive heart disease with heart failure: Secondary | ICD-10-CM | POA: Diagnosis not present

## 2015-05-21 DIAGNOSIS — E0842 Diabetes mellitus due to underlying condition with diabetic polyneuropathy: Secondary | ICD-10-CM | POA: Diagnosis not present

## 2015-05-21 DIAGNOSIS — E6609 Other obesity due to excess calories: Secondary | ICD-10-CM | POA: Diagnosis not present

## 2015-05-21 DIAGNOSIS — F064 Anxiety disorder due to known physiological condition: Secondary | ICD-10-CM | POA: Diagnosis not present

## 2015-05-21 DIAGNOSIS — M130 Polyarthritis, unspecified: Secondary | ICD-10-CM | POA: Diagnosis not present

## 2015-05-28 DIAGNOSIS — I110 Hypertensive heart disease with heart failure: Secondary | ICD-10-CM | POA: Diagnosis not present

## 2015-05-28 DIAGNOSIS — E089 Diabetes mellitus due to underlying condition without complications: Secondary | ICD-10-CM | POA: Diagnosis not present

## 2015-05-28 DIAGNOSIS — E6609 Other obesity due to excess calories: Secondary | ICD-10-CM | POA: Diagnosis not present

## 2015-05-28 DIAGNOSIS — F064 Anxiety disorder due to known physiological condition: Secondary | ICD-10-CM | POA: Diagnosis not present

## 2015-05-31 DIAGNOSIS — G894 Chronic pain syndrome: Secondary | ICD-10-CM | POA: Diagnosis not present

## 2015-05-31 DIAGNOSIS — M545 Low back pain: Secondary | ICD-10-CM | POA: Diagnosis not present

## 2015-05-31 DIAGNOSIS — Z79891 Long term (current) use of opiate analgesic: Secondary | ICD-10-CM | POA: Diagnosis not present

## 2015-06-21 ENCOUNTER — Other Ambulatory Visit: Payer: Self-pay | Admitting: Family Medicine

## 2015-06-21 DIAGNOSIS — N6489 Other specified disorders of breast: Secondary | ICD-10-CM

## 2015-06-22 DIAGNOSIS — G8929 Other chronic pain: Secondary | ICD-10-CM | POA: Diagnosis not present

## 2015-06-22 DIAGNOSIS — E0842 Diabetes mellitus due to underlying condition with diabetic polyneuropathy: Secondary | ICD-10-CM | POA: Diagnosis not present

## 2015-06-22 DIAGNOSIS — M130 Polyarthritis, unspecified: Secondary | ICD-10-CM | POA: Diagnosis not present

## 2015-06-22 DIAGNOSIS — I10 Essential (primary) hypertension: Secondary | ICD-10-CM | POA: Diagnosis not present

## 2015-06-22 DIAGNOSIS — F064 Anxiety disorder due to known physiological condition: Secondary | ICD-10-CM | POA: Diagnosis not present

## 2015-06-22 DIAGNOSIS — E669 Obesity, unspecified: Secondary | ICD-10-CM | POA: Diagnosis not present

## 2015-06-28 DIAGNOSIS — Z79891 Long term (current) use of opiate analgesic: Secondary | ICD-10-CM | POA: Diagnosis not present

## 2015-06-28 DIAGNOSIS — G541 Lumbosacral plexus disorders: Secondary | ICD-10-CM | POA: Diagnosis not present

## 2015-06-28 DIAGNOSIS — G603 Idiopathic progressive neuropathy: Secondary | ICD-10-CM | POA: Diagnosis not present

## 2015-06-28 DIAGNOSIS — M5442 Lumbago with sciatica, left side: Secondary | ICD-10-CM | POA: Diagnosis not present

## 2015-07-22 ENCOUNTER — Ambulatory Visit
Admission: RE | Admit: 2015-07-22 | Discharge: 2015-07-22 | Disposition: A | Discharge status: Home / Self Care | Payer: Medicaid Other | Source: Ambulatory Visit | Attending: Family Medicine | Admitting: Family Medicine

## 2015-07-22 ENCOUNTER — Ambulatory Visit
Admission: RE | Admit: 2015-07-22 | Discharge: 2015-07-22 | Disposition: A | Discharge status: Home / Self Care | Payer: Medicare Other | Source: Ambulatory Visit | Attending: Family Medicine | Admitting: Family Medicine

## 2015-07-22 DIAGNOSIS — N6489 Other specified disorders of breast: Secondary | ICD-10-CM

## 2015-07-22 DIAGNOSIS — R928 Other abnormal and inconclusive findings on diagnostic imaging of breast: Secondary | ICD-10-CM | POA: Diagnosis not present

## 2015-07-28 DIAGNOSIS — Z79891 Long term (current) use of opiate analgesic: Secondary | ICD-10-CM | POA: Diagnosis not present

## 2015-08-10 DIAGNOSIS — Z6833 Body mass index (BMI) 33.0-33.9, adult: Secondary | ICD-10-CM | POA: Diagnosis not present

## 2015-08-10 DIAGNOSIS — I10 Essential (primary) hypertension: Secondary | ICD-10-CM | POA: Diagnosis not present

## 2015-08-10 DIAGNOSIS — E089 Diabetes mellitus due to underlying condition without complications: Secondary | ICD-10-CM | POA: Diagnosis not present

## 2015-08-10 DIAGNOSIS — E6609 Other obesity due to excess calories: Secondary | ICD-10-CM | POA: Diagnosis not present

## 2015-08-25 DIAGNOSIS — G894 Chronic pain syndrome: Secondary | ICD-10-CM | POA: Diagnosis not present

## 2015-08-25 DIAGNOSIS — G8929 Other chronic pain: Secondary | ICD-10-CM | POA: Diagnosis not present

## 2015-08-25 DIAGNOSIS — Z79891 Long term (current) use of opiate analgesic: Secondary | ICD-10-CM | POA: Diagnosis not present

## 2015-08-25 DIAGNOSIS — M5442 Lumbago with sciatica, left side: Secondary | ICD-10-CM | POA: Diagnosis not present

## 2015-08-25 DIAGNOSIS — M545 Low back pain: Secondary | ICD-10-CM | POA: Diagnosis not present

## 2015-08-26 DIAGNOSIS — E089 Diabetes mellitus due to underlying condition without complications: Secondary | ICD-10-CM | POA: Diagnosis not present

## 2015-09-10 DIAGNOSIS — I10 Essential (primary) hypertension: Secondary | ICD-10-CM | POA: Diagnosis not present

## 2015-09-10 DIAGNOSIS — E0842 Diabetes mellitus due to underlying condition with diabetic polyneuropathy: Secondary | ICD-10-CM | POA: Diagnosis not present

## 2015-09-10 DIAGNOSIS — I110 Hypertensive heart disease with heart failure: Secondary | ICD-10-CM | POA: Diagnosis not present

## 2015-09-10 DIAGNOSIS — R561 Post traumatic seizures: Secondary | ICD-10-CM | POA: Diagnosis not present

## 2015-09-10 DIAGNOSIS — G8929 Other chronic pain: Secondary | ICD-10-CM | POA: Diagnosis not present

## 2015-09-10 DIAGNOSIS — E669 Obesity, unspecified: Secondary | ICD-10-CM | POA: Diagnosis not present

## 2015-09-13 ENCOUNTER — Encounter: Payer: Self-pay | Admitting: Skilled Nursing Facility1

## 2015-09-13 ENCOUNTER — Encounter: Payer: Medicare Other | Attending: Family Medicine | Admitting: Skilled Nursing Facility1

## 2015-09-13 VITALS — Ht 64.0 in

## 2015-09-13 DIAGNOSIS — E119 Type 2 diabetes mellitus without complications: Secondary | ICD-10-CM | POA: Insufficient documentation

## 2015-09-13 NOTE — Progress Notes (Signed)
Diabetes Self-Management Education  Visit Type: First/Initial  Appt. Start Time: 2:00 Appt. End Time: 4:00  09/13/2015  [name], identified by name and date of birth, is a 45 y.o. female with a diagnosis of Diabetes: Type 2.   ASSESSMENT  Height 5\' 4"  (1.626 m). There is no weight on file to calculate BMI.      Diabetes Self-Management Education - 09/13/15 1403    Visit Information   Visit Type First/Initial   Initial Visit   Diabetes Type Type 2   Are you currently following a meal plan? No   Are you taking your medications as prescribed? Yes   Date Diagnosed 2006   Health Coping   How would you rate your overall health? Poor   Psychosocial Assessment   Patient Belief/Attitude about Diabetes Defeat/Burnout   Self-care barriers None   Other persons present Family Member   Patient Concerns Nutrition/Meal planning   Pre-Education Assessment   Patient understands the diabetes disease and treatment process. Needs Instruction   Patient understands incorporating nutritional management into lifestyle. Needs Instruction   Patient undertands incorporating physical activity into lifestyle. Needs Instruction   Patient understands using medications safely. Needs Instruction   Patient understands monitoring blood glucose, interpreting and using results Needs Instruction   Patient understands prevention, detection, and treatment of acute complications. Needs Instruction   Patient understands prevention, detection, and treatment of chronic complications. Needs Instruction   Patient understands how to develop strategies to address psychosocial issues. Needs Instruction   Patient understands how to develop strategies to promote health/change behavior. Needs Instruction   Complications   Last HgB A1C per patient/outside source 10.4 %   How often do you check your blood sugar? 3-4 times/day   Fasting Blood glucose range (mg/dL) 130-179   Have you had a dilated eye exam in the past 12  months? Yes   Have you had a dental exam in the past 12 months? Yes   Are you checking your feet? Yes   How many days per week are you checking your feet? 7   Dietary Intake   Breakfast none   Snack (morning) chips   Lunch none----sandwich and chips----frozen meal   Snack (afternoon) muffins   Dinner prok chops----steak----potatoes, rice, steamed vegeatbles   Snack (evening) ice cream-----popcorn   Beverage(s) flavored water, diet soda, coffee   Exercise   Exercise Type ADL's   Patient Education   Previous Diabetes Education No   Disease state  Definition of diabetes, type 1 and 2, and the diagnosis of diabetes;Factors that contribute to the development of diabetes   Nutrition management  Role of diet in the treatment of diabetes and the relationship between the three main macronutrients and blood glucose level;Food label reading, portion sizes and measuring food.;Carbohydrate counting;Reviewed blood glucose goals for pre and post meals and how to evaluate the patients' food intake on their blood glucose level.;Information on hints to eating out and maintain blood glucose control.;Meal options for control of blood glucose level and chronic complications.   Physical activity and exercise  Role of exercise on diabetes management, blood pressure control and cardiac health.;Identified with patient nutritional and/or medication changes necessary with exercise.;Helped patient identify appropriate exercises in relation to his/her diabetes, diabetes complications and other health issue.   Monitoring Purpose and frequency of SMBG.;Taught/discussed recording of test results and interpretation of SMBG.;Yearly dilated eye exam;Daily foot exams;Interpreting lab values - A1C, lipid, urine microalbumina.   Acute complications Taught treatment of hypoglycemia - the  15 rule.   Chronic complications Assessed and discussed foot care and prevention of foot problems;Dental care;Reviewed with patient heart disease,  higher risk of, and prevention;Lipid levels, blood glucose control and heart disease;Retinopathy and reason for yearly dilated eye exams;Relationship between chronic complications and blood glucose control;Identified and discussed with patient  current chronic complications   Psychosocial adjustment Role of stress on diabetes;Helped patient identify a support system for diabetes management   Individualized Goals (developed by patient)   Nutrition Adjust meds/carbs with exercise as discussed;General guidelines for healthy choices and portions discussed;Follow meal plan discussed   Physical Activity Exercise 3-5 times per week;30 minutes per day   Monitoring  test my blood glucose as discussed;test blood glucose pre and post meals as discussed   Post-Education Assessment   Patient understands the diabetes disease and treatment process. Demonstrates understanding / competency   Patient understands incorporating nutritional management into lifestyle. Demonstrates understanding / competency   Patient undertands incorporating physical activity into lifestyle. Demonstrates understanding / competency   Patient understands using medications safely. Demonstrates understanding / competency   Patient understands monitoring blood glucose, interpreting and using results Demonstrates understanding / competency   Patient understands prevention, detection, and treatment of acute complications. Demonstrates understanding / competency   Patient understands prevention, detection, and treatment of chronic complications. Demonstrates understanding / competency   Patient understands how to develop strategies to address psychosocial issues. Demonstrates understanding / competency   Patient understands how to develop strategies to promote health/change behavior. Demonstrates understanding / competency   Outcomes   Expected Outcomes Demonstrated interest in learning. Expect positive outcomes   Future DMSE PRN   Program  Status Completed      Individualized Plan for Diabetes Self-Management Training:   Learning Objective:  Patient will have a greater understanding of diabetes self-management. Patient education plan is to attend individual and/or group sessions per assessed needs and concerns.   Plan:   There are no Patient Instructions on file for this visit.  Expected Outcomes:  Demonstrated interest in learning. Expect positive outcomes  Education material provided: Living Well with Diabetes, Meal plan card, My Plate and Snack sheet  If problems or questions, patient to contact team via:  Phone  Future DSME appointment: PRN

## 2015-09-23 ENCOUNTER — Encounter: Payer: Self-pay | Admitting: Family Medicine

## 2015-09-30 DIAGNOSIS — G541 Lumbosacral plexus disorders: Secondary | ICD-10-CM | POA: Diagnosis not present

## 2015-09-30 DIAGNOSIS — G894 Chronic pain syndrome: Secondary | ICD-10-CM | POA: Diagnosis not present

## 2015-09-30 DIAGNOSIS — G603 Idiopathic progressive neuropathy: Secondary | ICD-10-CM | POA: Diagnosis not present

## 2015-09-30 DIAGNOSIS — M5442 Lumbago with sciatica, left side: Secondary | ICD-10-CM | POA: Diagnosis not present

## 2015-09-30 DIAGNOSIS — Z79891 Long term (current) use of opiate analgesic: Secondary | ICD-10-CM | POA: Diagnosis not present

## 2015-10-29 DIAGNOSIS — G894 Chronic pain syndrome: Secondary | ICD-10-CM | POA: Diagnosis not present

## 2015-10-29 DIAGNOSIS — Z79891 Long term (current) use of opiate analgesic: Secondary | ICD-10-CM | POA: Diagnosis not present

## 2015-10-29 DIAGNOSIS — M5442 Lumbago with sciatica, left side: Secondary | ICD-10-CM | POA: Diagnosis not present

## 2015-10-29 DIAGNOSIS — M545 Low back pain: Secondary | ICD-10-CM | POA: Diagnosis not present

## 2015-10-29 DIAGNOSIS — G8929 Other chronic pain: Secondary | ICD-10-CM | POA: Diagnosis not present

## 2015-10-31 ENCOUNTER — Other Ambulatory Visit: Payer: Self-pay | Admitting: Cardiovascular Disease

## 2015-11-01 NOTE — Telephone Encounter (Signed)
pantoprazole (PROTONIX) 40 MG tablet  Medication  Date: 12/24/2014 Department: [address] Office Ordering/Authorizing: Burnell Blanks, MD  Order Providers   Prescribing Provider Encounter Provider  Burnell Blanks, MD Burnell Blanks, MD  Medication Detail    Disp Refills Start End   pantoprazole (PROTONIX) 40 MG tablet 30 tablet 10 12/24/2014    Sig: TAKE 1 TABLET (40 MG TOTAL) BY MOUTH DAILY.   E-Prescribing Status: Receipt confirmed by pharmacy (12/24/2014 2:59 PM EDT)   Pharmacy   [id number] - [place], [place] - [address]

## 2015-11-04 DIAGNOSIS — E119 Type 2 diabetes mellitus without complications: Secondary | ICD-10-CM | POA: Diagnosis not present

## 2015-11-04 DIAGNOSIS — I10 Essential (primary) hypertension: Secondary | ICD-10-CM | POA: Diagnosis not present

## 2015-11-08 ENCOUNTER — Other Ambulatory Visit (HOSPITAL_COMMUNITY): Payer: Self-pay | Admitting: General Surgery

## 2015-11-09 DIAGNOSIS — E669 Obesity, unspecified: Secondary | ICD-10-CM | POA: Diagnosis not present

## 2015-11-09 DIAGNOSIS — M130 Polyarthritis, unspecified: Secondary | ICD-10-CM | POA: Diagnosis not present

## 2015-11-09 DIAGNOSIS — R561 Post traumatic seizures: Secondary | ICD-10-CM | POA: Diagnosis not present

## 2015-11-09 DIAGNOSIS — I10 Essential (primary) hypertension: Secondary | ICD-10-CM | POA: Diagnosis not present

## 2015-11-11 ENCOUNTER — Ambulatory Visit: Payer: Medicare Other | Admitting: Dietician

## 2015-11-16 ENCOUNTER — Ambulatory Visit: Payer: Medicare Other | Admitting: Skilled Nursing Facility1

## 2015-11-21 ENCOUNTER — Other Ambulatory Visit: Payer: Self-pay | Admitting: Cardiovascular Disease

## 2015-11-22 ENCOUNTER — Ambulatory Visit (HOSPITAL_COMMUNITY): Payer: Medicare Other

## 2015-11-26 DIAGNOSIS — E669 Obesity, unspecified: Secondary | ICD-10-CM | POA: Diagnosis not present

## 2015-11-29 DIAGNOSIS — M5416 Radiculopathy, lumbar region: Secondary | ICD-10-CM | POA: Diagnosis not present

## 2015-11-29 DIAGNOSIS — M79662 Pain in left lower leg: Secondary | ICD-10-CM | POA: Diagnosis not present

## 2015-11-29 DIAGNOSIS — M79661 Pain in right lower leg: Secondary | ICD-10-CM | POA: Diagnosis not present

## 2015-11-29 DIAGNOSIS — Z79891 Long term (current) use of opiate analgesic: Secondary | ICD-10-CM | POA: Diagnosis not present

## 2015-11-29 DIAGNOSIS — G894 Chronic pain syndrome: Secondary | ICD-10-CM | POA: Diagnosis not present

## 2015-11-29 DIAGNOSIS — M79604 Pain in right leg: Secondary | ICD-10-CM | POA: Diagnosis not present

## 2015-11-29 DIAGNOSIS — R202 Paresthesia of skin: Secondary | ICD-10-CM | POA: Diagnosis not present

## 2015-11-29 DIAGNOSIS — M79605 Pain in left leg: Secondary | ICD-10-CM | POA: Diagnosis not present

## 2015-12-10 DIAGNOSIS — I10 Essential (primary) hypertension: Secondary | ICD-10-CM | POA: Diagnosis not present

## 2015-12-10 DIAGNOSIS — I110 Hypertensive heart disease with heart failure: Secondary | ICD-10-CM | POA: Diagnosis not present

## 2015-12-10 DIAGNOSIS — Z6833 Body mass index (BMI) 33.0-33.9, adult: Secondary | ICD-10-CM | POA: Diagnosis not present

## 2015-12-10 DIAGNOSIS — E0842 Diabetes mellitus due to underlying condition with diabetic polyneuropathy: Secondary | ICD-10-CM | POA: Diagnosis not present

## 2015-12-13 ENCOUNTER — Encounter: Payer: Self-pay | Admitting: *Deleted

## 2015-12-14 ENCOUNTER — Ambulatory Visit: Payer: Medicare Other | Admitting: Psychiatry

## 2015-12-16 DIAGNOSIS — E669 Obesity, unspecified: Secondary | ICD-10-CM | POA: Diagnosis not present

## 2015-12-27 ENCOUNTER — Encounter (INDEPENDENT_AMBULATORY_CARE_PROVIDER_SITE_OTHER): Payer: Self-pay

## 2015-12-27 ENCOUNTER — Ambulatory Visit (INDEPENDENT_AMBULATORY_CARE_PROVIDER_SITE_OTHER): Payer: Medicare Other | Admitting: Cardiovascular Disease

## 2015-12-27 ENCOUNTER — Encounter: Payer: Self-pay | Admitting: Cardiovascular Disease

## 2015-12-27 VITALS — BP 114/72 | HR 63 | Ht 64.0 in | Wt 214.4 lb

## 2015-12-27 DIAGNOSIS — E785 Hyperlipidemia, unspecified: Secondary | ICD-10-CM

## 2015-12-27 DIAGNOSIS — Z9889 Other specified postprocedural states: Secondary | ICD-10-CM

## 2015-12-27 DIAGNOSIS — Z01810 Encounter for preprocedural cardiovascular examination: Secondary | ICD-10-CM

## 2015-12-27 DIAGNOSIS — I10 Essential (primary) hypertension: Secondary | ICD-10-CM | POA: Diagnosis not present

## 2015-12-27 DIAGNOSIS — F17201 Nicotine dependence, unspecified, in remission: Secondary | ICD-10-CM

## 2015-12-27 DIAGNOSIS — I25810 Atherosclerosis of coronary artery bypass graft(s) without angina pectoris: Secondary | ICD-10-CM

## 2015-12-27 NOTE — Progress Notes (Signed)
Chief Complaint  Patient presents with  . Fatigue     History of Present Illness: 45 yo female with history of CAD s/p 4VCABG 2011, DM, HTN, HLD, MVP who is here today for cardiac follow up. She has been followed by Dr. [name]. Cardiac cath 04/01/09 per Dr. 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 Also severe MR. She underwent a CABG x4 (LIMA to LAD, SVG to subbranch of ramus intermediate, SVG to lateral subbranch of ramus intermediate, SVG to distal RCA) and mitral valve repair 04/07/09.  Last echo September 2016 with normal LV systolic function, stable mitral valve repair.   She is here today for follow up. She has been feeling great. No chest pain or SOB. No lower ext edema. She is planning gastric band placement per Dr. Excell Seltzer with [facility].   Primary Care Physician: Dr. Criss Rosales   Past Medical History:  Diagnosis Date  . CHF (congestive heart failure) ([place])   . Diabetes mellitus   . H/O emotional problems   . Headache, migraine   . Heart attack ([place])   . High cholesterol   . History of blood transfusion   . Hypertension   . MI (myocardial infarction) ([place])   . MRSA (methicillin resistant Staphylococcus aureus)   . MVP (mitral valve prolapse)   . Neuromuscular disorder ([place])   . Neuropathy ([place])   . Sebaceous cyst     Past Surgical History:  Procedure Laterality Date  . ABDOMINAL HYSTERECTOMY    . CORONARY ARTERY BYPASS GRAFT  with valve repair  . SKIN GRAFT      Current Outpatient Prescriptions  Medication Sig Dispense Refill  . aspirin 81 MG tablet Take 81 mg by mouth daily.     . carvedilol (COREG) 12.5 MG tablet Take 12.5 mg by mouth 2 (two) times daily.    Marland Kitchen gabapentin (NEURONTIN) 600 MG tablet Take 600 mg by mouth daily.    . insulin aspart (NOVOLOG FLEXPEN) 100 UNIT/ML injection Inject 10-12 Units into the skin 3 (three) times daily with meals. 10 units  at breakfast  10 units at lunch and 12 units at dinner    . insulin glargine (LANTUS SOLOSTAR) 100 UNIT/ML injection Inject 28 Units into the skin at bedtime.    Marland Kitchen lisinopril (PRINIVIL,ZESTRIL) 20 MG tablet Take 20 mg by mouth daily.    . Naloxone HCl (EVZIO) 0.4 MG/0.4ML SOAJ Inject as directed as needed (for possible overdose).     Marland Kitchen oxyCODONE-acetaminophen (PERCOCET) 10-325 MG per tablet Take 1 tablet by mouth 3 (three) times daily.    . pantoprazole (PROTONIX) 40 MG tablet TAKE 1 TABLET (40 MG TOTAL) BY MOUTH DAILY. 30 tablet 1  . rosuvastatin (CRESTOR) 10 MG tablet Take 10 mg by mouth daily.     No current facility-administered medications for this visit.     Allergies  Allergen Reactions  . Metformin Diarrhea and Nausea And Vomiting    Social History   Social History  . Marital status: Divorced    Spouse name: N/A  . Number of children: 2  . Years of education: N/A   Occupational History  . Disability    Social History Main Topics  . Smoking status: Former Smoker    Packs/day: 1.00    Years: 28.00    Types: Cigarettes    Quit date: 03/31/2009  . Smokeless tobacco: Never Used  . Alcohol use No  .  Drug use: No  . Sexual activity: Yes    Birth control/ protection: Surgical     Comment: partial hysterectomy   Other Topics Concern  . Not on file   Social History Narrative  . No narrative on file    Family History  Problem Relation Age of Onset  . Heart disease Father   . Asthma Father   . Hypertension Father   . Diabetes Father   . Arthritis Father   . Heart disease Mother   . Hypertension Mother   . Diabetes Mother   . Migraines Mother   . Stroke Mother   . Emphysema Maternal Grandmother   . Hypertension Brother   . Diabetes Brother     Review of Systems:  As stated in the HPI and otherwise negative.   BP 114/72 (BP Location: Left Arm, Patient Position: Sitting, Cuff Size: Normal)   Pulse 63   Ht 5\' 4"  (1.626 m)   Wt 97.3 kg (214 lb 6.4 oz)    BMI 36.80 kg/m   Physical Examination: General: Well developed, well nourished, NAD  HEENT: OP clear, mucus membranes moist  SKIN: warm, dry. No rashes. Neuro: No focal deficits  Musculoskeletal: Muscle strength 5/5 all ext  Psychiatric: Mood and affect normal  Neck: No JVD, no carotid bruits, no thyromegaly, no lymphadenopathy.  Lungs:Clear bilaterally, no wheezes, rhonci, crackles Cardiovascular: Regular rate and rhythm. No murmurs, gallops or rubs. Abdomen:Soft. Bowel sounds present. Non-tender.  Extremities: No lower extremity edema. Pulses are 2 + in the bilateral DP/PT.  Echo September 2016: Left ventricle: The cavity size was normal. Wall thickness was   normal. Systolic function was normal. The estimated ejection   fraction was in the range of 55% to 60%. Wall motion was normal;   there were no regional wall motion abnormalities. Doppler   parameters are consistent with abnormal left ventricular   relaxation (grade 1 diastolic dysfunction). - Aortic valve: There was no stenosis. - Mitral valve: Status post mitral valve repair. Probably mild   mitral stenosis. There was no significant regurgitation. Mean   gradient (D): 6 mm Hg. Valve area by pressure half-time: 1.3   cm^2. - Right ventricle: The cavity size was normal. Systolic function   was mildly reduced. - Pulmonary arteries: No complete TR doppler jet so unable to   estimate PA systolic pressure. - Inferior vena cava: The vessel was normal in size. The   respirophasic diameter changes were in the normal range (>= 50%),   consistent with normal central venous pressure.  Impressions:  - Technically difficult study with poor acoustic windows. Normal LV   size with EF 55-60%. Normal RV size with mildly decreased   systolic function. Status post mitral valve repair with no   significant regurgitation. There is probably mild mitral   stenosis.  EKG:  EKG is ordered today. The ekg ordered today demonstrates NSR,  rate 63 bpm. Poor R wave progression precordial leads, no change.   Recent Labs: No results found for requested labs within last 8760 hours.   Lipid Panel  Wt Readings from Last 3 Encounters:  12/27/15 97.3 kg (214 lb 6.4 oz)  11/26/14 92.5 kg (204 lb)  08/12/14 91.7 kg (202 lb 3.2 oz)     Other studies Reviewed: Additional studies/ records that were reviewed today include: . Review of the above records demonstrates:    Assessment and Plan:   1. CAD: She is stable post CABG. She has no chest pain suggestive of angina.  Continue ASA, statin, beta blocker.     2. HYPERLIPIDEMIA: Continue statin. Lipids followed in primary care.    3. HYPERTENSION: BP controlled. No changes.   4. TOBACCO ABUSE, in remission: She stopped smoking in 2010.   5. MITRAL REGURGITATION: Mitral valve repair stable by echo 2016.   6. Pre-operative cardiovascular examination: She is very active (>10 METS per day). She has no chest pain suggestive of angina or any symptoms concerning for arrhythmias or CHF. Her EKG is normal. The planned surgery is a low risk procedure. I think she can proceed with the planned surgical procedure without further cardiac testing.     Current medicines are reviewed at length with the patient today.  The patient does not have concerns regarding medicines.  The following changes have been made:  no change  Labs/ tests ordered today include:   No orders of the defined types were placed in this encounter.   Disposition:   FU with me in 12  months  Signed, Lauree Chandler, MD 12/27/2015 10:18 AM    [address], [place], [place]  [address] Phone: [phone]; Fax: [phone]

## 2015-12-27 NOTE — Patient Instructions (Signed)

## 2015-12-28 DIAGNOSIS — M79604 Pain in right leg: Secondary | ICD-10-CM | POA: Diagnosis not present

## 2015-12-28 DIAGNOSIS — M79605 Pain in left leg: Secondary | ICD-10-CM | POA: Diagnosis not present

## 2015-12-28 DIAGNOSIS — G894 Chronic pain syndrome: Secondary | ICD-10-CM | POA: Diagnosis not present

## 2015-12-28 DIAGNOSIS — M5416 Radiculopathy, lumbar region: Secondary | ICD-10-CM | POA: Diagnosis not present

## 2015-12-28 DIAGNOSIS — Z79891 Long term (current) use of opiate analgesic: Secondary | ICD-10-CM | POA: Diagnosis not present

## 2015-12-28 DIAGNOSIS — M79661 Pain in right lower leg: Secondary | ICD-10-CM | POA: Diagnosis not present

## 2015-12-28 DIAGNOSIS — R202 Paresthesia of skin: Secondary | ICD-10-CM | POA: Diagnosis not present

## 2015-12-28 DIAGNOSIS — M79662 Pain in left lower leg: Secondary | ICD-10-CM | POA: Diagnosis not present

## 2015-12-28 DIAGNOSIS — G890 Central pain syndrome: Secondary | ICD-10-CM | POA: Diagnosis not present

## 2016-01-04 ENCOUNTER — Ambulatory Visit (INDEPENDENT_AMBULATORY_CARE_PROVIDER_SITE_OTHER): Payer: Medicare Other | Admitting: Podiatry

## 2016-01-04 ENCOUNTER — Encounter: Payer: Self-pay | Admitting: Podiatry

## 2016-01-04 VITALS — BP 146/79 | HR 63 | Resp 16 | Ht 64.0 in | Wt 213.0 lb

## 2016-01-04 DIAGNOSIS — M79676 Pain in unspecified toe(s): Secondary | ICD-10-CM | POA: Diagnosis not present

## 2016-01-04 DIAGNOSIS — B351 Tinea unguium: Secondary | ICD-10-CM

## 2016-01-04 DIAGNOSIS — I25810 Atherosclerosis of coronary artery bypass graft(s) without angina pectoris: Secondary | ICD-10-CM

## 2016-01-04 DIAGNOSIS — L03031 Cellulitis of right toe: Secondary | ICD-10-CM | POA: Diagnosis not present

## 2016-01-04 NOTE — Progress Notes (Signed)
   Subjective:    Patient ID: Elizabeth Park, female    DOB: Sep 17, 1970, 45 y.o.   MRN: [id number]  HPI   This patient presents today with approximately six-month history of the right hallux nail developing texture and color changes primarily along the lateral aspects the nail plate. Patient has noticed that this area is expanded over time. She's had no self treatment or professional treatment for this problem Patient has a friend who is present in the treatment room  Patient is a diabetic and denies history of claudication, amputation or open skin ulceration     Review of Systems  All other systems reviewed and are negative.      Objective:   Physical Exam   Objective: Orientated 3  Vascular: DP and PT pulses 2/4 bilaterally Capillary reflex immediate bilaterally  Neurological: Ankle reflex equal and reactive bilaterally Vibratory sensation intact bilaterally Sensation to 10 g monofilament wire intact 5/5 bilaterall  Dermatological: No open skin lesions bilaterally The distal lateral aspect the right hallux has texture and color changes, brown, yellow with subungual debris  Musculoskeletal: No deformities bilaterally Dorsi flexion, plantar flexion 5/5 bilaterally          Assessment & Plan:   Assessment: Diabetic with protective sensation intact Diabetic with a history of neuropathy Satisfactory vascular status Mycotic right hallux toenail  Plan: Discuss treatment options including topical versus oral for possible nail fungus. Patient is interested in the treatment that would resolve it with a high success rate and would be willing to consider oral medication Nail fragments obtained from the right hallux and submitted for PAS and fungal culture  Notify patient upon receipt of lab results

## 2016-01-04 NOTE — Patient Instructions (Signed)
A your diabetic foot screen demonstrated adequate pulsations and protective sensation intact Submitted a portion of the nail fragment for evaluation for possible nail fungus. Results available in 4-6 weeks Our office will contact you with the results of the lab  Diabetes and Foot Care Diabetes may cause you to have problems because of poor blood supply (circulation) to your feet and legs. This may cause the skin on your feet to become thinner, break easier, and heal more slowly. Your skin may become dry, and the skin may peel and crack. You may also have nerve damage in your legs and feet causing decreased feeling in them. You may not notice minor injuries to your feet that could lead to infections or more serious problems. Taking care of your feet is one of the most important things you can do for yourself.  HOME CARE INSTRUCTIONS  Wear shoes at all times, even in the house. Do not go barefoot. Bare feet are easily injured.  Check your feet daily for blisters, cuts, and redness. If you cannot see the bottom of your feet, use a mirror or ask someone for help.  Wash your feet with warm water (do not use hot water) and mild soap. Then pat your feet and the areas between your toes until they are completely dry. Do not soak your feet as this can dry your skin.  Apply a moisturizing lotion or petroleum jelly (that does not contain alcohol and is unscented) to the skin on your feet and to dry, brittle toenails. Do not apply lotion between your toes.  Trim your toenails straight across. Do not dig under them or around the cuticle. File the edges of your nails with an emery board or nail file.  Do not cut corns or calluses or try to remove them with medicine.  Wear clean socks or stockings every day. Make sure they are not too tight. Do not wear knee-high stockings since they may decrease blood flow to your legs.  Wear shoes that fit properly and have enough cushioning. To break in new shoes, wear them  for just a few hours a day. This prevents you from injuring your feet. Always look in your shoes before you put them on to be sure there are no objects inside.  Do not cross your legs. This may decrease the blood flow to your feet.  If you find a minor scrape, cut, or break in the skin on your feet, keep it and the skin around it clean and dry. These areas may be cleansed with mild soap and water. Do not cleanse the area with peroxide, alcohol, or iodine.  When you remove an adhesive bandage, be sure not to damage the skin around it.  If you have a wound, look at it several times a day to make sure it is healing.  Do not use heating pads or hot water bottles. They may burn your skin. If you have lost feeling in your feet or legs, you may not know it is happening until it is too late.  Make sure your health care provider performs a complete foot exam at least annually or more often if you have foot problems. Report any cuts, sores, or bruises to your health care provider immediately. SEEK MEDICAL CARE IF:   You have an injury that is not healing.  You have cuts or breaks in the skin.  You have an ingrown nail.  You notice redness on your legs or feet.  You feel burning  or tingling in your legs or feet.  You have pain or cramps in your legs and feet.  Your legs or feet are numb.  Your feet always feel cold. SEEK IMMEDIATE MEDICAL CARE IF:   There is increasing redness, swelling, or pain in or around a wound.  There is a red line that goes up your leg.  Pus is coming from a wound.  You develop a fever or as directed by your health care provider.  You notice a bad smell coming from an ulcer or wound.   This information is not intended to replace advice given to you by your health care provider. Make sure you discuss any questions you have with your health care provider.   Document Released: 03/17/2000 Document Revised: 11/20/2012 Document Reviewed: 08/27/2012 Elsevier  Interactive Patient Education [employer].

## 2016-01-14 ENCOUNTER — Other Ambulatory Visit: Payer: Self-pay | Admitting: Family Medicine

## 2016-01-14 DIAGNOSIS — Z1231 Encounter for screening mammogram for malignant neoplasm of breast: Secondary | ICD-10-CM

## 2016-01-26 ENCOUNTER — Telehealth: Payer: Self-pay | Admitting: *Deleted

## 2016-01-28 ENCOUNTER — Telehealth: Payer: Self-pay | Admitting: *Deleted

## 2016-01-28 NOTE — Telephone Encounter (Signed)
-----   Message from Gean Birchwood, DPM sent at 01/26/2016  8:01 AM EDT ----- Lab positive for saprophytic molds Suggest patient apply over-the-counter Fungi-Nail plus urea cream 40% plus directly to the nail on ongoing daily basis 6 months or toenail clippers

## 2016-01-28 NOTE — Telephone Encounter (Addendum)
-----   Message from Gean Birchwood, DPM sent at 01/26/2016  8:01 AM EDT ----- Lab positive for saprophytic molds Suggest patient apply over-the-counter Fungi-Nail plus urea cream 40% plus directly to the nail on ongoing daily basis 6 months or toenail clippers. 01/28/2016-Left message requesting pt to call for results. 02/01/2016-pt called of fungal results. Informed pt of Dr Phoebe Perch orders.

## 2016-02-04 ENCOUNTER — Ambulatory Visit: Payer: Medicare Other

## 2016-02-08 DIAGNOSIS — M79605 Pain in left leg: Secondary | ICD-10-CM | POA: Diagnosis not present

## 2016-02-08 DIAGNOSIS — M79604 Pain in right leg: Secondary | ICD-10-CM | POA: Diagnosis not present

## 2016-02-08 DIAGNOSIS — M79662 Pain in left lower leg: Secondary | ICD-10-CM | POA: Diagnosis not present

## 2016-02-08 DIAGNOSIS — Z79891 Long term (current) use of opiate analgesic: Secondary | ICD-10-CM | POA: Diagnosis not present

## 2016-02-08 DIAGNOSIS — M79661 Pain in right lower leg: Secondary | ICD-10-CM | POA: Diagnosis not present

## 2016-02-08 DIAGNOSIS — M545 Low back pain: Secondary | ICD-10-CM | POA: Diagnosis not present

## 2016-02-08 DIAGNOSIS — G894 Chronic pain syndrome: Secondary | ICD-10-CM | POA: Diagnosis not present

## 2016-03-07 DIAGNOSIS — M545 Low back pain: Secondary | ICD-10-CM | POA: Diagnosis not present

## 2016-03-07 DIAGNOSIS — M79605 Pain in left leg: Secondary | ICD-10-CM | POA: Diagnosis not present

## 2016-03-07 DIAGNOSIS — Z79891 Long term (current) use of opiate analgesic: Secondary | ICD-10-CM | POA: Diagnosis not present

## 2016-03-07 DIAGNOSIS — M79604 Pain in right leg: Secondary | ICD-10-CM | POA: Diagnosis not present

## 2016-03-07 DIAGNOSIS — G894 Chronic pain syndrome: Secondary | ICD-10-CM | POA: Diagnosis not present

## 2016-03-07 DIAGNOSIS — M79662 Pain in left lower leg: Secondary | ICD-10-CM | POA: Diagnosis not present

## 2016-03-07 DIAGNOSIS — M79661 Pain in right lower leg: Secondary | ICD-10-CM | POA: Diagnosis not present

## 2016-03-28 ENCOUNTER — Other Ambulatory Visit: Payer: Self-pay | Admitting: General Surgery

## 2016-03-29 ENCOUNTER — Other Ambulatory Visit (HOSPITAL_COMMUNITY): Payer: Self-pay | Admitting: General Surgery

## 2016-04-04 DIAGNOSIS — J209 Acute bronchitis, unspecified: Secondary | ICD-10-CM | POA: Diagnosis not present

## 2016-04-06 ENCOUNTER — Ambulatory Visit (HOSPITAL_COMMUNITY)
Admission: RE | Admit: 2016-04-06 | Discharge: 2016-04-06 | Disposition: A | Discharge status: Home / Self Care | Payer: Medicare Other | Source: Ambulatory Visit | Attending: General Surgery | Admitting: General Surgery

## 2016-04-06 ENCOUNTER — Other Ambulatory Visit: Payer: Self-pay

## 2016-04-06 DIAGNOSIS — Z951 Presence of aortocoronary bypass graft: Secondary | ICD-10-CM | POA: Diagnosis not present

## 2016-04-06 DIAGNOSIS — J9811 Atelectasis: Secondary | ICD-10-CM | POA: Insufficient documentation

## 2016-04-12 DIAGNOSIS — M545 Low back pain: Secondary | ICD-10-CM | POA: Diagnosis not present

## 2016-04-12 DIAGNOSIS — M79662 Pain in left lower leg: Secondary | ICD-10-CM | POA: Diagnosis not present

## 2016-04-12 DIAGNOSIS — Z79891 Long term (current) use of opiate analgesic: Secondary | ICD-10-CM | POA: Diagnosis not present

## 2016-04-12 DIAGNOSIS — M79661 Pain in right lower leg: Secondary | ICD-10-CM | POA: Diagnosis not present

## 2016-04-12 DIAGNOSIS — G894 Chronic pain syndrome: Secondary | ICD-10-CM | POA: Diagnosis not present

## 2016-04-18 ENCOUNTER — Encounter: Payer: Self-pay | Admitting: Skilled Nursing Facility1

## 2016-04-18 ENCOUNTER — Encounter: Payer: Medicare Other | Attending: General Surgery | Admitting: Skilled Nursing Facility1

## 2016-04-18 DIAGNOSIS — I10 Essential (primary) hypertension: Secondary | ICD-10-CM | POA: Insufficient documentation

## 2016-04-18 DIAGNOSIS — Z713 Dietary counseling and surveillance: Secondary | ICD-10-CM | POA: Insufficient documentation

## 2016-04-18 DIAGNOSIS — Z79899 Other long term (current) drug therapy: Secondary | ICD-10-CM | POA: Insufficient documentation

## 2016-04-18 DIAGNOSIS — E119 Type 2 diabetes mellitus without complications: Secondary | ICD-10-CM | POA: Insufficient documentation

## 2016-04-18 NOTE — Progress Notes (Addendum)
  Pt was seen for her first [facility] appointment to be seen for her assessment next month. Pt states she checks her blood sugar 4 times a day and is taking insulin. Pt states her numbers range between 180-250. Pt was accompanied by her friend.   Start weight at [facility]: 206.12 pounds Weight today: 206.12  24 hr Dietary Recall: First Meal: omelet  Snack: crackers Second Meal: fast food Snack:  Third Meal: meat and potatoes  Snack: ice cream Beverages: water, coffee   -Follow diet recommendations listed below   Energy and Macronutrient Recomendations: Calories: 1500 Carbohydrate: 170 Protein: 112 Fat: 42  Encouraged to engage in 40 minutes of moderate physical activity including cardiovascular and weight baring weekly Preferred Learning Style:   No preference indicated   Learning Readiness:  Change in progress  Handouts given during visit include:  Pre-Op Goals Bariatric Surgery Protein Shakes   During the appointment today the following Pre-Op Goals were reviewed with the patient: Maintain or lose weight as instructed by your surgeon Make healthy food choices Begin to limit portion sizes Limited concentrated sugars and fried foods Keep fat/sugar in the single digits per serving on  food labels Practice CHEWING your food  (aim for 30 chews per bite or until applesauce consistency) Practice not drinking 15 minutes before, during, and 30 minutes after each meal/snack Avoid all carbonated beverages  Avoid/limit caffeinated beverages  Avoid all sugar-sweetened beverages Consume 3 meals per day; eat every 3-5 hours Make a list of non-food related activities Aim for 64-100 ounces of FLUID daily  Aim for at least 60-80 grams of PROTEIN daily Look for a liquid protein source that contain ?15 g protein and ?5 g carbohydrate  (ex: shakes, drinks, shots)  Patient-Centered Goals: 10/10 specific/non-scale and confidence/importance scale 1-10  Demonstrated degree of understanding  via:  Teach Back  Teaching Method Utilized:  Visual Auditory Hands on  Barriers to learning/adherence to lifestyle change: none identified  Patient to call the [facility] to enroll in Pre-Op and Post-Op Nutrition Education when surgery date is scheduled.

## 2016-04-27 DIAGNOSIS — E6609 Other obesity due to excess calories: Secondary | ICD-10-CM | POA: Diagnosis not present

## 2016-04-27 DIAGNOSIS — G8929 Other chronic pain: Secondary | ICD-10-CM | POA: Diagnosis not present

## 2016-04-27 DIAGNOSIS — E089 Diabetes mellitus due to underlying condition without complications: Secondary | ICD-10-CM | POA: Diagnosis not present

## 2016-04-27 DIAGNOSIS — I110 Hypertensive heart disease with heart failure: Secondary | ICD-10-CM | POA: Diagnosis not present

## 2016-05-09 DIAGNOSIS — J0100 Acute maxillary sinusitis, unspecified: Secondary | ICD-10-CM | POA: Diagnosis not present

## 2016-05-09 DIAGNOSIS — J111 Influenza due to unidentified influenza virus with other respiratory manifestations: Secondary | ICD-10-CM | POA: Diagnosis not present

## 2016-05-09 DIAGNOSIS — J209 Acute bronchitis, unspecified: Secondary | ICD-10-CM | POA: Diagnosis not present

## 2016-05-11 ENCOUNTER — Ambulatory Visit: Payer: Medicare Other

## 2016-05-15 DIAGNOSIS — Z79891 Long term (current) use of opiate analgesic: Secondary | ICD-10-CM | POA: Diagnosis not present

## 2016-05-15 DIAGNOSIS — M545 Low back pain: Secondary | ICD-10-CM | POA: Diagnosis not present

## 2016-05-15 DIAGNOSIS — M79661 Pain in right lower leg: Secondary | ICD-10-CM | POA: Diagnosis not present

## 2016-05-15 DIAGNOSIS — G894 Chronic pain syndrome: Secondary | ICD-10-CM | POA: Diagnosis not present

## 2016-05-15 DIAGNOSIS — M79662 Pain in left lower leg: Secondary | ICD-10-CM | POA: Diagnosis not present

## 2016-05-15 DIAGNOSIS — M79605 Pain in left leg: Secondary | ICD-10-CM | POA: Diagnosis not present

## 2016-05-15 DIAGNOSIS — M79604 Pain in right leg: Secondary | ICD-10-CM | POA: Diagnosis not present

## 2016-05-16 ENCOUNTER — Encounter: Payer: Medicare Other | Attending: General Surgery | Admitting: Skilled Nursing Facility1

## 2016-05-16 ENCOUNTER — Encounter: Payer: Self-pay | Admitting: Skilled Nursing Facility1

## 2016-05-16 DIAGNOSIS — I10 Essential (primary) hypertension: Secondary | ICD-10-CM | POA: Insufficient documentation

## 2016-05-16 DIAGNOSIS — Z713 Dietary counseling and surveillance: Secondary | ICD-10-CM | POA: Diagnosis not present

## 2016-05-16 DIAGNOSIS — Z79899 Other long term (current) drug therapy: Secondary | ICD-10-CM | POA: Insufficient documentation

## 2016-05-16 DIAGNOSIS — E119 Type 2 diabetes mellitus without complications: Secondary | ICD-10-CM | POA: Diagnosis not present

## 2016-05-16 NOTE — Progress Notes (Signed)
  Assessment.   Pt states she checks her blood sugar about 3-4 times a day; Blood sugars about 175 fasting. Pt states her A1C is 9. Pt states she is Getting the lapband instead of other surgery options due to the permanency of the other surgeries. Pt states her down fall is popcorn. Pt states she has been working on the pre-op goals and it is going well. Pt states cutting out the sierra mist is easy.   Start weight at [facility]: 206.12 pounds Weight today: 204.12.8 BMI: 35.15  24 hr Dietary Recall: First Meal: none-----fast food biscuit Snack: popcorn  Second Meal: soup or tuna fish and crackers Snack: popcorn Third Meal: chicken alfredo Snack: ice cream Beverages: water, coffee, diet sierrra mist   -Follow diet recommendations listed below   Energy and Macronutrient Recomendations: Calories: 1500 Carbohydrate: 170 Protein: 112 Fat: 42  Encouraged to engage in 40 minutes of moderate physical activity including cardiovascular and weight baring weekly Preferred Learning Style:   No preference indicated   Learning Readiness:  Change in progress  Handouts given during visit include:  Pre-Op Goals Bariatric Surgery Protein Shakes   During the appointment today the following Pre-Op Goals were reviewed with the patient: Maintain or lose weight as instructed by your surgeon Make healthy food choices Begin to limit portion sizes Limited concentrated sugars and fried foods Keep fat/sugar in the single digits per serving on  food labels Practice CHEWING your food  (aim for 30 chews per bite or until applesauce consistency) Practice not drinking 15 minutes before, during, and 30 minutes after each meal/snack Avoid all carbonated beverages  Avoid/limit caffeinated beverages  Avoid all sugar-sweetened beverages Consume 3 meals per day; eat every 3-5 hours Make a list of non-food related activities Aim for 64-100 ounces of FLUID daily  Aim for at least 60-80 grams of PROTEIN  daily Look for a liquid protein source that contain ?15 g protein and ?5 g carbohydrate  (ex: shakes, drinks, shots)  Patient-Centered Goals: 10/10 specific/non-scale and confidence/importance scale 1-10  Demonstrated degree of understanding via:  Teach Back  Teaching Method Utilized:  Visual Auditory Hands on  Barriers to learning/adherence to lifestyle change: none identified  [facility] to enroll in Pre-Op and Post-Op Nutrition Education when surgery date is scheduled.

## 2016-05-30 DIAGNOSIS — I10 Essential (primary) hypertension: Secondary | ICD-10-CM | POA: Diagnosis not present

## 2016-05-30 DIAGNOSIS — F064 Anxiety disorder due to known physiological condition: Secondary | ICD-10-CM | POA: Diagnosis not present

## 2016-05-30 DIAGNOSIS — E6609 Other obesity due to excess calories: Secondary | ICD-10-CM | POA: Diagnosis not present

## 2016-05-30 DIAGNOSIS — H6502 Acute serous otitis media, left ear: Secondary | ICD-10-CM | POA: Diagnosis not present

## 2016-06-12 DIAGNOSIS — M79662 Pain in left lower leg: Secondary | ICD-10-CM | POA: Diagnosis not present

## 2016-06-12 DIAGNOSIS — M79604 Pain in right leg: Secondary | ICD-10-CM | POA: Diagnosis not present

## 2016-06-12 DIAGNOSIS — Z79891 Long term (current) use of opiate analgesic: Secondary | ICD-10-CM | POA: Diagnosis not present

## 2016-06-12 DIAGNOSIS — G894 Chronic pain syndrome: Secondary | ICD-10-CM | POA: Diagnosis not present

## 2016-06-12 DIAGNOSIS — M5416 Radiculopathy, lumbar region: Secondary | ICD-10-CM | POA: Diagnosis not present

## 2016-06-12 DIAGNOSIS — M79661 Pain in right lower leg: Secondary | ICD-10-CM | POA: Diagnosis not present

## 2016-06-12 DIAGNOSIS — M79605 Pain in left leg: Secondary | ICD-10-CM | POA: Diagnosis not present

## 2016-06-13 ENCOUNTER — Encounter: Payer: Self-pay | Admitting: Skilled Nursing Facility1

## 2016-06-13 ENCOUNTER — Encounter: Payer: Medicare Other | Attending: General Surgery | Admitting: Skilled Nursing Facility1

## 2016-06-13 DIAGNOSIS — E119 Type 2 diabetes mellitus without complications: Secondary | ICD-10-CM | POA: Diagnosis not present

## 2016-06-13 DIAGNOSIS — Z713 Dietary counseling and surveillance: Secondary | ICD-10-CM | POA: Insufficient documentation

## 2016-06-13 DIAGNOSIS — Z79899 Other long term (current) drug therapy: Secondary | ICD-10-CM | POA: Diagnosis not present

## 2016-06-13 DIAGNOSIS — I10 Essential (primary) hypertension: Secondary | ICD-10-CM | POA: Insufficient documentation

## 2016-06-13 NOTE — Progress Notes (Signed)
  Pt states she Really likes the strawberry premier. Pt was given chocolate to try.Pt states she takes metformin 2 times day with Glucose numbers 140-150's. Pt states she Needs SWL appointments until July. Pt states she got stressed and drank sierra mist and is Trying other fluids like tea and lemonade.  Pt states she will work on not drinking with meals for the next month.   Start weight at [facility]: 206.12 pounds Weight today: 208.8 BMI: 35.84  24 hr Dietary Recall: First Meal: shake Snack: popcorn  Second Meal: salad Snack: popcorn Third Meal: chicken with vegetables and rice  Snack: ice cream Beverages: water, coffee, diet sierrra mist, milk  -Follow diet recommendations listed below   Energy and Macronutrient Recomendations: Calories: 1500 Carbohydrate: 170 Protein: 112 Fat: 42  Encouraged to engage in 40 minutes of moderate physical activity including cardiovascular and weight baring weekly Preferred Learning Style:   No preference indicated   Learning Readiness:  Change in progress  Handouts given during visit include:  Pre-Op Goals Bariatric Surgery Protein Shakes   During the appointment today the following Pre-Op Goals were reviewed with the patient: Maintain or lose weight as instructed by your surgeon Make healthy food choices Begin to limit portion sizes Limited concentrated sugars and fried foods Keep fat/sugar in the single digits per serving on  food labels Practice CHEWING your food  (aim for 30 chews per bite or until applesauce consistency) Practice not drinking 15 minutes before, during, and 30 minutes after each meal/snack Avoid all carbonated beverages  Avoid/limit caffeinated beverages  Avoid all sugar-sweetened beverages Consume 3 meals per day; eat every 3-5 hours Make a list of non-food related activities Aim for 64-100 ounces of FLUID daily  Aim for at least 60-80 grams of PROTEIN daily Look for a liquid protein source that contain ?15 g  protein and ?5 g carbohydrate  (ex: shakes, drinks, shots)  Patient-Centered Goals: 10/10 specific/non-scale and confidence/importance scale 1-10  Demonstrated degree of understanding via:  Teach Back  Teaching Method Utilized:  Visual Auditory Hands on  Barriers to learning/adherence to lifestyle change: none identified  [facility] to [facility] the [facility] to enroll in Pre-Op and Post-Op Nutrition Education when surgery date is scheduled.

## 2016-06-15 DIAGNOSIS — H6502 Acute serous otitis media, left ear: Secondary | ICD-10-CM | POA: Diagnosis not present

## 2016-07-12 ENCOUNTER — Encounter: Payer: Self-pay | Admitting: Skilled Nursing Facility1

## 2016-07-12 ENCOUNTER — Encounter: Payer: Medicare Other | Attending: General Surgery | Admitting: Skilled Nursing Facility1

## 2016-07-12 DIAGNOSIS — Z79899 Other long term (current) drug therapy: Secondary | ICD-10-CM | POA: Insufficient documentation

## 2016-07-12 DIAGNOSIS — E119 Type 2 diabetes mellitus without complications: Secondary | ICD-10-CM

## 2016-07-12 DIAGNOSIS — Z713 Dietary counseling and surveillance: Secondary | ICD-10-CM | POA: Insufficient documentation

## 2016-07-12 DIAGNOSIS — I10 Essential (primary) hypertension: Secondary | ICD-10-CM | POA: Diagnosis not present

## 2016-07-12 NOTE — Progress Notes (Signed)
  Pt arrives having gained 2 pounds from previous visit. Pt states she has 3 more SWL visits left. Pt states that she's taking [employer] at night. Fasting is less than 140. Pt states that she's ready to have procedure.   Start weight at [facility]: 206.12 pounds Weight today: 210.8 BMI: 36.18  24 hr Dietary Recall: First Meal: shake Snack: none  Second Meal: tuna fish snadnwhices Snack: none Third Meal: chicken, [place], grilled shrimp, salad  Snack: popcorn Beverages: water, decaf coffee, diet sierrra mist, fat free milk, tea, lemonade  -Follow diet recommendations listed below   Energy and Macronutrient Recomendations: Calories: 1500 Carbohydrate: 170 Protein: 112 Fat: 42  Encouraged to engage in 30 minutes of moderate physical activity including cardiovascular and weight baring weekly  Preferred Learning Style:   No preference indicated   Learning Readiness:  Change in progress  Handouts given during visit include:  Vitamins and Supplements    Patient-Centered Goals: 10/10 specific/non-scale and confidence/importance scale 1-10  Demonstrated degree of understanding via:  Teach Back  Teaching Method Utilized:  Visual Auditory  Barriers to learning/adherence to lifestyle change: none identified  [facility] to [facility] the [facility] to enroll in Pre-Op and Post-Op Nutrition Education when surgery date is scheduled.

## 2016-07-12 NOTE — Patient Instructions (Addendum)
-   Increase physical activity with walking on treadmill and walking to mailbox for about 10 min 3 times per week  - Get bariatric multivitamin from [facility]

## 2016-07-18 DIAGNOSIS — F064 Anxiety disorder due to known physiological condition: Secondary | ICD-10-CM | POA: Diagnosis not present

## 2016-07-18 DIAGNOSIS — J301 Allergic rhinitis due to pollen: Secondary | ICD-10-CM | POA: Diagnosis not present

## 2016-07-18 DIAGNOSIS — I10 Essential (primary) hypertension: Secondary | ICD-10-CM | POA: Diagnosis not present

## 2016-07-18 DIAGNOSIS — E669 Obesity, unspecified: Secondary | ICD-10-CM | POA: Diagnosis not present

## 2016-07-18 DIAGNOSIS — E089 Diabetes mellitus due to underlying condition without complications: Secondary | ICD-10-CM | POA: Diagnosis not present

## 2016-07-18 DIAGNOSIS — M130 Polyarthritis, unspecified: Secondary | ICD-10-CM | POA: Diagnosis not present

## 2016-08-03 DIAGNOSIS — E782 Mixed hyperlipidemia: Secondary | ICD-10-CM | POA: Diagnosis not present

## 2016-08-03 DIAGNOSIS — E089 Diabetes mellitus due to underlying condition without complications: Secondary | ICD-10-CM | POA: Diagnosis not present

## 2016-08-03 DIAGNOSIS — E6609 Other obesity due to excess calories: Secondary | ICD-10-CM | POA: Diagnosis not present

## 2016-08-03 DIAGNOSIS — E282 Polycystic ovarian syndrome: Secondary | ICD-10-CM | POA: Diagnosis not present

## 2016-08-03 DIAGNOSIS — I110 Hypertensive heart disease with heart failure: Secondary | ICD-10-CM | POA: Diagnosis not present

## 2016-08-07 DIAGNOSIS — G894 Chronic pain syndrome: Secondary | ICD-10-CM | POA: Diagnosis not present

## 2016-08-07 DIAGNOSIS — M79604 Pain in right leg: Secondary | ICD-10-CM | POA: Diagnosis not present

## 2016-08-07 DIAGNOSIS — M545 Low back pain: Secondary | ICD-10-CM | POA: Diagnosis not present

## 2016-08-07 DIAGNOSIS — M79605 Pain in left leg: Secondary | ICD-10-CM | POA: Diagnosis not present

## 2016-08-07 DIAGNOSIS — Z79891 Long term (current) use of opiate analgesic: Secondary | ICD-10-CM | POA: Diagnosis not present

## 2016-08-07 DIAGNOSIS — M79662 Pain in left lower leg: Secondary | ICD-10-CM | POA: Diagnosis not present

## 2016-08-07 DIAGNOSIS — M79661 Pain in right lower leg: Secondary | ICD-10-CM | POA: Diagnosis not present

## 2016-08-09 ENCOUNTER — Encounter: Payer: Self-pay | Admitting: Registered"

## 2016-08-09 ENCOUNTER — Encounter: Payer: Medicare Other | Attending: General Surgery | Admitting: Registered"

## 2016-08-09 DIAGNOSIS — I10 Essential (primary) hypertension: Secondary | ICD-10-CM | POA: Insufficient documentation

## 2016-08-09 DIAGNOSIS — Z713 Dietary counseling and surveillance: Secondary | ICD-10-CM | POA: Diagnosis not present

## 2016-08-09 DIAGNOSIS — Z79899 Other long term (current) drug therapy: Secondary | ICD-10-CM | POA: Insufficient documentation

## 2016-08-09 DIAGNOSIS — E119 Type 2 diabetes mellitus without complications: Secondary | ICD-10-CM | POA: Diagnosis not present

## 2016-08-09 DIAGNOSIS — E669 Obesity, unspecified: Secondary | ICD-10-CM

## 2016-08-09 NOTE — Progress Notes (Signed)
Appt start time: 10:20 end time:  10:38  Assessment: 4th SWL Appointment   Start Wt at [facility]: 206.12 Wt: 213.4 BMI: 36.63   Pt arrives with younger son having gained 2.6 lbs from previous visit. Pt states she is ready for surgery and planning to order her vitamins and supplements online in the near future. Pt states she has been working on her physical activity, able to walk to and from mailbox daily. Pt states she is still cutting back on her soda Caryl Comes Mist) consumption and coffee. Pt states she is working on chewing to applesauce consistency as well.   Pt states she needs SWL visits until July and has them scheduled already.    Preferred Learning Style:   Auditory  Visual  No preference indicated   Learning Readiness:   Contemplating  Ready  Change in progress  MEDICATIONS: See list   DIETARY INTAKE:  24-hr recall:  B ( AM): skipped  Snk ( AM): none  L ( PM): tuna fish sandwich Snk ( PM): none D ( PM): stuffed meatloaf, mashed potatoes, biscuits and gravy, broccoli Snk ( PM): popcorn Beverages: water, soda, coffee   Usual physical activity: walking to and from mailbox daily  Diet to Follow: 1500 calories 170 g carbohydrates 112 g protein 42 g fat   Nutritional Diagnosis:  [place]-3.3 Overweight/obesity related to past poor dietary habits and physical inactivity as evidenced by patient w/ upcoming LAGB surgery following dietary guidelines for continued weight loss.    Intervention:  Nutrition counseling for upcoming Bariatric Surgery.  Goals: - Continue to working on cutting back on sodas and caffeine. - Add in breakfast item such as protein shake.  Teaching Method Utilized:  Visual Auditory Hands on  Handouts given during visit include:  Vitamin and Mineral Supplements  Barriers to learning/adherence to lifestyle change: none  Demonstrated degree of understanding via:  Teach Back   Monitoring/Evaluation:  Dietary intake, exercise, and body weight  in 1 month(s).

## 2016-08-09 NOTE — Patient Instructions (Addendum)
-   Continue to working on cutting back on sodas and caffeine.  - Add in breakfast item such as protein shake.

## 2016-09-04 ENCOUNTER — Encounter: Payer: Self-pay | Admitting: Physician Assistant

## 2016-09-04 ENCOUNTER — Ambulatory Visit (INDEPENDENT_AMBULATORY_CARE_PROVIDER_SITE_OTHER): Payer: Medicare Other | Admitting: Physician Assistant

## 2016-09-04 ENCOUNTER — Encounter (INDEPENDENT_AMBULATORY_CARE_PROVIDER_SITE_OTHER): Payer: Self-pay

## 2016-09-04 VITALS — BP 120/78 | HR 65 | Ht 64.0 in | Wt 216.1 lb

## 2016-09-04 DIAGNOSIS — I10 Essential (primary) hypertension: Secondary | ICD-10-CM | POA: Diagnosis not present

## 2016-09-04 DIAGNOSIS — I209 Angina pectoris, unspecified: Secondary | ICD-10-CM

## 2016-09-04 DIAGNOSIS — Z9889 Other specified postprocedural states: Secondary | ICD-10-CM | POA: Diagnosis not present

## 2016-09-04 DIAGNOSIS — I25709 Atherosclerosis of coronary artery bypass graft(s), unspecified, with unspecified angina pectoris: Secondary | ICD-10-CM | POA: Diagnosis not present

## 2016-09-04 DIAGNOSIS — R079 Chest pain, unspecified: Secondary | ICD-10-CM

## 2016-09-04 DIAGNOSIS — E785 Hyperlipidemia, unspecified: Secondary | ICD-10-CM | POA: Diagnosis not present

## 2016-09-04 NOTE — Progress Notes (Signed)
Cardiology Office Note    Date:  09/04/2016   ID:  Elizabeth Park, DOB 1971-01-20, MRN [id number]  PCP:  Lucianne Lei, MD  Cardiologist: Dr. Angelena Form  Chief Complaint  Patient presents with  . Follow-up    chest discomfort    History of Present Illness:  Elizabeth Park is a 46 y.o. female with history of CAD status post CABG 4 in 2011 with a LIMA to the LAD, SVG to subbranch of ramus intermediate, SVG to lateral subbranch of ramus intermediate, SVG to distal RCA, and MVR, hypertension, HLD, DM, MVP. Last echo 4854 normal LV systolic function is stable mitral valve repair. Last saw Dr. Angelena Form 12/2015 and doing well. She was cleared for gastric band surgery but it's now scheduled for this July.  Patient comes in today complaining of "cramp" in left chest when she over exerts herself or walks fast. Goes away as soon as she stops. Different from her prior chest pain. Says it's not a tightness, doesn't radiate, no shortness of breath.  Just started a month ago. She thinks it's because she gained some weight and just started getting more active in her yard. She was picking up rocks and pulling weeds yesterday without any symptoms.  Past Medical History:  Diagnosis Date  . CHF (congestive heart failure) ([place])   . Diabetes mellitus   . H/O emotional problems   . Headache, migraine   . Heart attack ([place])   . High cholesterol   . History of blood transfusion   . Hypertension   . MI (myocardial infarction) ([place])   . MRSA (methicillin resistant Staphylococcus aureus)   . MVP (mitral valve prolapse)   . Neuromuscular disorder ([place])   . Neuropathy   . Sebaceous cyst     Past Surgical History:  Procedure Laterality Date  . ABDOMINAL HYSTERECTOMY    . CORONARY ARTERY BYPASS GRAFT  with valve repair  . SKIN GRAFT      Current Medications: Outpatient Medications Prior to Visit  Medication Sig Dispense Refill  . aspirin 81 MG tablet Take 81 mg by mouth daily.     . carvedilol (COREG) 12.5  MG tablet Take 12.5 mg by mouth 2 (two) times daily.    . fluticasone (FLONASE) 50 MCG/ACT nasal spray Place into both nostrils daily.    Marland Kitchen gabapentin (NEURONTIN) 600 MG tablet Take 600 mg by mouth daily.    . insulin aspart (NOVOLOG FLEXPEN) 100 UNIT/ML injection Inject 10-12 Units into the skin 3 (three) times daily with meals. 10 units at breakfast  10 units at lunch and 12 units at dinner    . lisinopril (PRINIVIL,ZESTRIL) 20 MG tablet Take 20 mg by mouth daily.    . montelukast (SINGULAIR) 10 MG tablet Take 10 mg by mouth at bedtime.    . Naloxone HCl (EVZIO) 0.4 MG/0.4ML SOAJ Inject as directed as needed (for possible overdose).     Marland Kitchen oxyCODONE-acetaminophen (PERCOCET) 10-325 MG per tablet Take 1 tablet by mouth 3 (three) times daily.    . rosuvastatin (CRESTOR) 10 MG tablet Take 10 mg by mouth daily.    . insulin glargine (LANTUS SOLOSTAR) 100 UNIT/ML injection Inject 28 Units into the skin at bedtime.    . pantoprazole (PROTONIX) 40 MG tablet TAKE 1 TABLET (40 MG TOTAL) BY MOUTH DAILY. 30 tablet 1   No facility-administered medications prior to visit.      Allergies:   Metformin   Social History   Social History  .  Marital status: Divorced    Spouse name: N/A  . Number of children: 2  . Years of education: N/A   Occupational History  . Disability    Social History Main Topics  . Smoking status: Former Smoker    Packs/day: 1.00    Years: 28.00    Types: Cigarettes    Quit date: 03/31/2009  . Smokeless tobacco: Never Used  . Alcohol use No  . Drug use: No  . Sexual activity: Yes    Birth control/ protection: Surgical     Comment: partial hysterectomy   Other Topics Concern  . None   Social History Narrative  . None     Family History:  The patient's family history includes Arthritis in her father; Asthma in her father; Diabetes in her brother, father, and mother; Emphysema in her maternal grandmother; Heart disease in her father and mother; Hypertension in her  brother, father, and mother; Migraines in her mother; Stroke in her mother.   ROS:   Please see the history of present illness.    Review of Systems  Constitution: Negative.  HENT: Negative.   Eyes: Negative.   Cardiovascular: Positive for chest pain.  Respiratory: Negative.   Hematologic/Lymphatic: Negative.   Musculoskeletal: Negative.  Negative for joint pain.  Gastrointestinal: Negative.   Genitourinary: Negative.   Neurological: Negative.    All other systems reviewed and are negative.   PHYSICAL EXAM:   VS:  BP 120/78   Pulse 65   Ht 5\' 4"  (1.626 m)   Wt 216 lb 1.9 oz (98 kg)   SpO2 97%   BMI 37.10 kg/m   Physical Exam  GEN: Obese, in no acute distress  Neck: no JVD, carotid bruits, or masses Cardiac: Distant heart sounds, RRR; no murmurs, rubs, or gallops  Respiratory:  clear to auscultation bilaterally, normal work of breathing GI: soft, nontender, nondistended, + BS Ext: without cyanosis, clubbing, or edema, Good distal pulses bilaterally Neuro:  Alert and Oriented x 3 Psych: euthymic mood, full affect  Wt Readings from Last 3 Encounters:  09/04/16 216 lb 1.9 oz (98 kg)  08/09/16 213 lb 6.4 oz (96.8 kg)  07/12/16 210 lb 12.8 oz (95.6 kg)      Studies/Labs Reviewed:   EKG:  EKG is  ordered today.  The ekg ordered today demonstrates normal sinus rhythm nonspecific ST-T wave changes, no acute change  Recent Labs: No results found for requested labs within last 8760 hours.   Lipid Panel    Component Value Date/Time   CHOL (H) 04/03/2009 0320    204        ATP III CLASSIFICATION:  <200     mg/dL   Desirable  200-239  mg/dL   Borderline High  >=240    mg/dL   High          TRIG 269 (H) 04/03/2009 0320   HDL 23 (L) 04/03/2009 0320   CHOLHDL 8.9 04/03/2009 0320   VLDL 54 (H) 04/03/2009 0320   LDLCALC (H) 04/03/2009 0320    127        Total Cholesterol/HDL:CHD Risk Coronary Heart Disease Risk Table                     Men   Women  1/2 Average Risk    3.4   3.3  Average Risk       5.0   4.4  2 X Average Risk   9.6   7.1  3  X Average Risk  23.4   11.0        Use the calculated Patient Ratio above and the CHD Risk Table to determine the patient's CHD Risk.        ATP III CLASSIFICATION (LDL):  <100     mg/dL   Optimal  100-129  mg/dL   Near or Above                    Optimal  130-159  mg/dL   Borderline  160-189  mg/dL   High  >190     mg/dL   Very High    Additional studies/ records that were reviewed today include:   Echo September 2016: Left ventricle: The cavity size was normal. Wall thickness was   normal. Systolic function was normal. The estimated ejection   fraction was in the range of 55% to 60%. Wall motion was normal;   there were no regional wall motion abnormalities. Doppler   parameters are consistent with abnormal left ventricular   relaxation (grade 1 diastolic dysfunction). - Aortic valve: There was no stenosis. - Mitral valve: Status post mitral valve repair. Probably mild   mitral stenosis. There was no significant regurgitation. Mean   gradient (D): 6 mm Hg. Valve area by pressure half-time: 1.3   cm^2. - Right ventricle: The cavity size was normal. Systolic function   was mildly reduced. - Pulmonary arteries: No complete TR doppler jet so unable to   estimate PA systolic pressure. - Inferior vena cava: The vessel was normal in size. The   respirophasic diameter changes were in the normal range (>= 50%),   consistent with normal central venous pressure.   Impressions:   - Technically difficult study with poor acoustic windows. Normal LV   size with EF 55-60%. Normal RV size with mildly decreased   systolic function. Status post mitral valve repair with no   significant regurgitation. There is probably mild mitral   stenosis.      ASSESSMENT:    1. Chest pain, unspecified type   2. 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 S/P mitral  valve repair   4. Essential hypertension   5. Hyperlipidemia, unspecified hyperlipidemia type      PLAN:  In order of problems listed above:  Chest pain described as a cramping while walking faster overexerts herself relieved immediately with rest. With history of CABG in 2011 recommend exercise Myoview. F/U with Dr. Angelena Form or myself in 1-2 months  Status post CABG in 2011 now with some chest pain  Status post mitral valve repair, no significant murmur on exam and last echo in 2016 stable  Essential hypertension controlled  Hyperlipidemia continue Crestor  Medication Adjustments/Labs and Tests Ordered: Current medicines are reviewed at length with the patient today.  Concerns regarding medicines are outlined above.  Medication changes, Labs and Tests ordered today are listed in the Patient Instructions below. There are no Patient Instructions on file for this visit.   Sumner Boast, PA-C  09/04/2016 11:08 AM    [facility] [address], [place], [place]  [address] Phone: [phone]; Fax: [phone]

## 2016-09-04 NOTE — Patient Instructions (Signed)
Medication Instructions:  Your physician recommends that you continue on your current medications as directed. Please refer to the Current Medication list given to you today.   Labwork: None ordered  Testing/Procedures: Your physician has requested that you have en exercise stress myoview. For further information please visit [email or web address]. Please follow instruction sheet, as given.    Follow-Up: Your physician recommends that you schedule a follow-up appointment in: [place]   Any Other Special Instructions Will Be Listed Below (If Applicable).     If you need a refill on your cardiac medications before your next appointment, please call your pharmacy.

## 2016-09-08 DIAGNOSIS — S90852S Superficial foreign body, left foot, sequela: Secondary | ICD-10-CM | POA: Diagnosis not present

## 2016-09-08 DIAGNOSIS — I10 Essential (primary) hypertension: Secondary | ICD-10-CM | POA: Diagnosis not present

## 2016-09-08 DIAGNOSIS — J301 Allergic rhinitis due to pollen: Secondary | ICD-10-CM | POA: Diagnosis not present

## 2016-09-08 DIAGNOSIS — E0842 Diabetes mellitus due to underlying condition with diabetic polyneuropathy: Secondary | ICD-10-CM | POA: Diagnosis not present

## 2016-09-08 DIAGNOSIS — I110 Hypertensive heart disease with heart failure: Secondary | ICD-10-CM | POA: Diagnosis not present

## 2016-09-11 ENCOUNTER — Encounter: Payer: Medicare Other | Attending: General Surgery | Admitting: Skilled Nursing Facility1

## 2016-09-11 ENCOUNTER — Encounter: Payer: Self-pay | Admitting: Skilled Nursing Facility1

## 2016-09-11 DIAGNOSIS — I10 Essential (primary) hypertension: Secondary | ICD-10-CM | POA: Diagnosis not present

## 2016-09-11 DIAGNOSIS — Z713 Dietary counseling and surveillance: Secondary | ICD-10-CM | POA: Diagnosis not present

## 2016-09-11 DIAGNOSIS — Z79899 Other long term (current) drug therapy: Secondary | ICD-10-CM | POA: Diagnosis not present

## 2016-09-11 DIAGNOSIS — E119 Type 2 diabetes mellitus without complications: Secondary | ICD-10-CM

## 2016-09-11 NOTE — Progress Notes (Signed)
Appt start time: 10:20 end time:  10:38  Assessment: 4th SWL Appointment   Start Wt at [facility]: 206.12 Wt: 217 BMI: 36.63   Pt states she is ready for surgery, she is waiting for psychologist visit(s). Pt states she has been working on her activity level by working out in the yard and walking up and down the driveway daily. Pt has an upcoming stress test. Pt has cut back on soda, "just about done", drinking about 1 mini can a day, and decaf coffee. Pt states she is drinking a lot of fluid a day. Pt is getting around 130-140s for blood sugar readings, pt has neuropathy and has a podiatrist, is checking 4 times/day. Pt is chewing food well to applesauce consistency and states her jaw starts to fatique. Pt told her family about surgery, and that she has a good support system. Pt got vitamins. Pt has one SWL left, in July. Got two big cases of protein shakes, does not like yogurt.   Preferred Learning Style:   Auditory  Visual  No preference indicated   Learning Readiness:   Contemplating  Ready  Change in progress  MEDICATIONS: See list   DIETARY INTAKE:  24-hr recall:  B ( AM):  toast and banana Snk ( AM): none  L ( PM): 1/2 tuna fish sandwich Snk ( PM): none D ( PM):  mushrooms, mac and cheese Snk ( PM): none Beverages: water, soda, coffee   Usual physical activity: walking to and from mailbox daily  Diet to Follow: 1500 calories 170 g carbohydrates 112 g protein 42 g fat   Nutritional Diagnosis:  [place]-3.3 Overweight/obesity related to past poor dietary habits and physical inactivity as evidenced by patient w/ upcoming LAGB surgery following dietary guidelines for continued weight loss.    Intervention:  Nutrition counseling for upcoming Bariatric Surgery.  Goals: - Increase protein levels. Examples: eggs, powder peanut butter, etc. - Continue with changes  Teaching Method Utilized:  Visual Auditory Hands on  Barriers to learning/adherence to lifestyle change:  none  Demonstrated degree of understanding via:  Teach Back   Monitoring/Evaluation:  Dietary intake, exercise, and body weight in 1 month(s).

## 2016-09-14 ENCOUNTER — Telehealth (HOSPITAL_COMMUNITY): Payer: Self-pay | Admitting: *Deleted

## 2016-09-14 DIAGNOSIS — G894 Chronic pain syndrome: Secondary | ICD-10-CM | POA: Diagnosis not present

## 2016-09-14 DIAGNOSIS — M79661 Pain in right lower leg: Secondary | ICD-10-CM | POA: Diagnosis not present

## 2016-09-14 DIAGNOSIS — M79662 Pain in left lower leg: Secondary | ICD-10-CM | POA: Diagnosis not present

## 2016-09-14 DIAGNOSIS — M79604 Pain in right leg: Secondary | ICD-10-CM | POA: Diagnosis not present

## 2016-09-14 DIAGNOSIS — M79605 Pain in left leg: Secondary | ICD-10-CM | POA: Diagnosis not present

## 2016-09-14 DIAGNOSIS — Z79891 Long term (current) use of opiate analgesic: Secondary | ICD-10-CM | POA: Diagnosis not present

## 2016-09-14 DIAGNOSIS — M545 Low back pain: Secondary | ICD-10-CM | POA: Diagnosis not present

## 2016-09-14 NOTE — Telephone Encounter (Signed)
Patient given detailed instructions per Myocardial Perfusion Study Information Sheet for the test on 09/19/16 by Loma Newton. Patient notified to arrive 15 minutes early and that it is imperative to arrive on time for appointment to keep from having the test rescheduled.  If you need to cancel or reschedule your appointment, please call the office within 24 hours of your appointment. . Patient verbalized understanding. Kirstie Peri

## 2016-09-14 NOTE — Telephone Encounter (Signed)
Left message on voicemail in reference to upcoming appointment scheduled for 09/19/16. Phone number given for a call back so details instructions can be given.  Elizabeth Park

## 2016-09-19 ENCOUNTER — Ambulatory Visit (HOSPITAL_COMMUNITY): Payer: Medicare Other | Attending: Cardiovascular Disease

## 2016-09-19 DIAGNOSIS — R079 Chest pain, unspecified: Secondary | ICD-10-CM

## 2016-09-19 DIAGNOSIS — R9439 Abnormal result of other cardiovascular function study: Secondary | ICD-10-CM | POA: Insufficient documentation

## 2016-09-19 MED ORDER — TECHNETIUM TC 99M TETROFOSMIN IV KIT
32.8000 | PACK | Freq: Once | INTRAVENOUS | Status: AC | PRN
  Administered 2016-09-19: 32.8 via INTRAVENOUS
  Filled 2016-09-19: qty 33

## 2016-09-19 MED ORDER — REGADENOSON 0.4 MG/5ML IV SOLN
0.4000 mg | Freq: Once | INTRAVENOUS | Status: AC
  Administered 2016-09-19: 0.4 mg via INTRAVENOUS

## 2016-09-20 ENCOUNTER — Ambulatory Visit (HOSPITAL_COMMUNITY): Payer: Medicare Other | Attending: Internal Medicine

## 2016-09-20 LAB — MYOCARDIAL PERFUSION IMAGING
Estimated workload: 6.4 METS
Exercise duration (min): 4 min
Exercise duration (sec): 30 s
LV dias vol: 69 mL (ref 46–106)
LV sys vol: 26 mL
MPHR: 173 {beats}/min
Peak HR: 141 {beats}/min
Percent HR: 81 %
RATE: 0.45
Rest HR: 80 {beats}/min
SDS: 0
SRS: 12
SSS: 12
TID: 1.05

## 2016-09-20 MED ORDER — TECHNETIUM TC 99M TETROFOSMIN IV KIT
30.6000 | PACK | Freq: Once | INTRAVENOUS | Status: AC | PRN
  Administered 2016-09-20: 30.6 via INTRAVENOUS
  Filled 2016-09-20: qty 31

## 2016-09-21 ENCOUNTER — Other Ambulatory Visit: Payer: Self-pay | Admitting: Physician Assistant

## 2016-09-21 ENCOUNTER — Encounter: Payer: Self-pay | Admitting: *Deleted

## 2016-09-21 ENCOUNTER — Other Ambulatory Visit: Payer: Medicare Other | Admitting: *Deleted

## 2016-09-21 ENCOUNTER — Other Ambulatory Visit: Payer: Self-pay | Admitting: *Deleted

## 2016-09-21 DIAGNOSIS — I10 Essential (primary) hypertension: Secondary | ICD-10-CM | POA: Diagnosis not present

## 2016-09-21 DIAGNOSIS — R079 Chest pain, unspecified: Secondary | ICD-10-CM

## 2016-09-21 DIAGNOSIS — M79661 Pain in right lower leg: Secondary | ICD-10-CM | POA: Diagnosis not present

## 2016-09-21 DIAGNOSIS — Z9889 Other specified postprocedural states: Secondary | ICD-10-CM | POA: Diagnosis not present

## 2016-09-21 DIAGNOSIS — Z79891 Long term (current) use of opiate analgesic: Secondary | ICD-10-CM | POA: Diagnosis not present

## 2016-09-21 DIAGNOSIS — M5416 Radiculopathy, lumbar region: Secondary | ICD-10-CM | POA: Diagnosis not present

## 2016-09-21 DIAGNOSIS — I200 Unstable angina: Secondary | ICD-10-CM

## 2016-09-21 DIAGNOSIS — I2510 Atherosclerotic heart disease of native coronary artery without angina pectoris: Secondary | ICD-10-CM | POA: Diagnosis not present

## 2016-09-21 DIAGNOSIS — I25700 Atherosclerosis of coronary artery bypass graft(s), unspecified, with unstable angina pectoris: Secondary | ICD-10-CM | POA: Diagnosis not present

## 2016-09-21 DIAGNOSIS — M79662 Pain in left lower leg: Secondary | ICD-10-CM | POA: Diagnosis not present

## 2016-09-21 DIAGNOSIS — M79604 Pain in right leg: Secondary | ICD-10-CM | POA: Diagnosis not present

## 2016-09-21 DIAGNOSIS — M545 Low back pain: Secondary | ICD-10-CM | POA: Diagnosis not present

## 2016-09-21 DIAGNOSIS — M79605 Pain in left leg: Secondary | ICD-10-CM | POA: Diagnosis not present

## 2016-09-21 DIAGNOSIS — G894 Chronic pain syndrome: Secondary | ICD-10-CM | POA: Diagnosis not present

## 2016-09-21 LAB — CBC WITH DIFFERENTIAL/PLATELET
Basophils Absolute: 0 10*3/uL (ref 0.0–0.2)
Basos: 0 %
EOS (ABSOLUTE): 0.1 10*3/uL (ref 0.0–0.4)
Eos: 1 %
Hematocrit: 42.5 % (ref 34.0–46.6)
Hemoglobin: 14.8 g/dL (ref 11.1–15.9)
Immature Grans (Abs): 0 10*3/uL (ref 0.0–0.1)
Immature Granulocytes: 0 %
Lymphocytes Absolute: 2.1 10*3/uL (ref 0.7–3.1)
Lymphs: 19 %
MCH: 32.2 pg (ref 26.6–33.0)
MCHC: 34.8 g/dL (ref 31.5–35.7)
MCV: 92 fL (ref 79–97)
Monocytes Absolute: 0.8 10*3/uL (ref 0.1–0.9)
Monocytes: 7 %
Neutrophils Absolute: 8.3 10*3/uL — ABNORMAL HIGH (ref 1.4–7.0)
Neutrophils: 73 %
Platelets: 320 10*3/uL (ref 150–379)
RBC: 4.6 x10E6/uL (ref 3.77–5.28)
RDW: 13 % (ref 12.3–15.4)
WBC: 11.3 10*3/uL — ABNORMAL HIGH (ref 3.4–10.8)

## 2016-09-21 LAB — BASIC METABOLIC PANEL
BUN/Creatinine Ratio: 24 — ABNORMAL HIGH (ref 9–23)
BUN: 20 mg/dL (ref 6–24)
CO2: 20 mmol/L (ref 20–29)
Calcium: 10.1 mg/dL (ref 8.7–10.2)
Chloride: 96 mmol/L (ref 96–106)
Creatinine, Ser: 0.85 mg/dL (ref 0.57–1.00)
GFR calc Af Amer: 94 mL/min/{1.73_m2} (ref >59)
GFR calc non Af Amer: 82 mL/min/{1.73_m2} (ref >59)
Glucose: 249 mg/dL — ABNORMAL HIGH (ref 65–99)
Potassium: 5 mmol/L (ref 3.5–5.2)
Sodium: 135 mmol/L (ref 134–144)

## 2016-09-21 LAB — PROTIME-INR
INR: 1 (ref 0.8–1.2)
Prothrombin Time: 10.4 s (ref 9.1–12.0)

## 2016-09-21 NOTE — Progress Notes (Unsigned)
Cardiology Office Note    Date:  09/04/2016   ID:  Elizabeth Park, DOB 1971/03/03, MRN [id number]  PCP:  Lucianne Lei, MD           Cardiologist: Dr. Angelena Form      Chief Complaint  Patient presents with  . Follow-up    chest discomfort    History of Present Illness:  Elizabeth Park is a 46 y.o. female with history of CAD status post CABG 4 in 2011 with a LIMA to the LAD, SVG to subbranch of ramus intermediate, SVG to lateral subbranch of ramus intermediate, SVG to distal RCA, and MVR, hypertension, HLD, DM, MVP. Last echo 7017 normal LV systolic function is stable mitral valve repair. Last saw Dr. Angelena Form 12/2015 and doing well. She was cleared for gastric band surgery but it's now scheduled for this July.  Patient comes in today complaining of "cramp" in left chest when she over exerts herself or walks fast. Goes away as soon as she stops. Different from her prior chest pain. Says it's not a tightness, doesn't radiate, no shortness of breath.  Just started a month ago. She thinks it's because she gained some weight and just started getting more active in her yard. She was picking up rocks and pulling weeds yesterday without any symptoms.  Past Medical History:  Diagnosis Date  . CHF (congestive heart failure) ([place])   . Diabetes mellitus   . H/O emotional problems   . Headache, migraine   . Heart attack ([place])   . High cholesterol   . History of blood transfusion   . Hypertension   . MI (myocardial infarction) ([place])   . MRSA (methicillin resistant Staphylococcus aureus)   . MVP (mitral valve prolapse)   . Neuromuscular disorder ([place])   . Neuropathy   . Sebaceous cyst          Past Surgical History:  Procedure Laterality Date  . ABDOMINAL HYSTERECTOMY    . CORONARY ARTERY BYPASS GRAFT  with valve repair  . SKIN GRAFT      Current Medications:       Outpatient Medications Prior to Visit  Medication Sig Dispense Refill  . aspirin 81 MG  tablet Take 81 mg by mouth daily.     . carvedilol (COREG) 12.5 MG tablet Take 12.5 mg by mouth 2 (two) times daily.    . fluticasone (FLONASE) 50 MCG/ACT nasal spray Place into both nostrils daily.    Marland Kitchen gabapentin (NEURONTIN) 600 MG tablet Take 600 mg by mouth daily.    . insulin aspart (NOVOLOG FLEXPEN) 100 UNIT/ML injection Inject 10-12 Units into the skin 3 (three) times daily with meals. 10 units at breakfast  10 units at lunch and 12 units at dinner    . lisinopril (PRINIVIL,ZESTRIL) 20 MG tablet Take 20 mg by mouth daily.    . montelukast (SINGULAIR) 10 MG tablet Take 10 mg by mouth at bedtime.    . Naloxone HCl (EVZIO) 0.4 MG/0.4ML SOAJ Inject as directed as needed (for possible overdose).     Marland Kitchen oxyCODONE-acetaminophen (PERCOCET) 10-325 MG per tablet Take 1 tablet by mouth 3 (three) times daily.    . rosuvastatin (CRESTOR) 10 MG tablet Take 10 mg by mouth daily.    . insulin glargine (LANTUS SOLOSTAR) 100 UNIT/ML injection Inject 28 Units into the skin at bedtime.    . pantoprazole (PROTONIX) 40 MG tablet TAKE 1 TABLET (40 MG TOTAL) BY MOUTH DAILY. 30 tablet 1   No  facility-administered medications prior to visit.      Allergies:   Metformin   Social History        Social History  . Marital status: Divorced    Spouse name: N/A  . Number of children: 2  . Years of education: N/A       Occupational History  . Disability          Social History Main Topics  . Smoking status: Former Smoker    Packs/day: 1.00    Years: 28.00    Types: Cigarettes    Quit date: 03/31/2009  . Smokeless tobacco: Never Used  . Alcohol use No  . Drug use: No  . Sexual activity: Yes    Birth control/ protection: Surgical     Comment: partial hysterectomy       Other Topics Concern  . None      Social History Narrative  . None     Family History:  The patient's family history includes Arthritis in her father; Asthma in her father;  Diabetes in her brother, father, and mother; Emphysema in her maternal grandmother; Heart disease in her father and mother; Hypertension in her brother, father, and mother; Migraines in her mother; Stroke in her mother.   ROS:   Please see the history of present illness.    Review of Systems  Constitution: Negative.  HENT: Negative.   Eyes: Negative.   Cardiovascular: Positive for chest pain.  Respiratory: Negative.   Hematologic/Lymphatic: Negative.   Musculoskeletal: Negative.  Negative for joint pain.  Gastrointestinal: Negative.   Genitourinary: Negative.   Neurological: Negative.    All other systems reviewed and are negative.   PHYSICAL EXAM:   VS:  BP 120/78   Pulse 65   Ht 5\' 4"  (1.626 m)   Wt 216 lb 1.9 oz (98 kg)   SpO2 97%   BMI 37.10 kg/m   Physical Exam  GEN: Obese, in no acute distress  Neck: no JVD, carotid bruits, or masses Cardiac: Distant heart sounds, RRR; no murmurs, rubs, or gallops  Respiratory:  clear to auscultation bilaterally, normal work of breathing GI: soft, nontender, nondistended, + BS Ext: without cyanosis, clubbing, or edema, Good distal pulses bilaterally Neuro:  Alert and Oriented x 3 Psych: euthymic mood, full affect     Wt Readings from Last 3 Encounters:  09/04/16 216 lb 1.9 oz (98 kg)  08/09/16 213 lb 6.4 oz (96.8 kg)  07/12/16 210 lb 12.8 oz (95.6 kg)      Studies/Labs Reviewed:   EKG:  EKG is  ordered today.  The ekg ordered today demonstrates normal sinus rhythm nonspecific ST-T wave changes, no acute change  Recent Labs: No results found for requested labs within last 8760 hours.   Lipid Panel Labs(Brief)           Component Value Date/Time   CHOL (H) 04/03/2009 0320    204        ATP III CLASSIFICATION:  <200     mg/dL   Desirable  200-239  mg/dL   Borderline High  >=240    mg/dL   High          TRIG 269 (H) 04/03/2009 0320   HDL 23 (L) 04/03/2009 0320   CHOLHDL 8.9 04/03/2009 0320   VLDL 54  (H) 04/03/2009 0320   LDLCALC (H) 04/03/2009 0320    127        Total Cholesterol/HDL:CHD Risk Coronary Heart Disease Risk Table  Men   Women  1/2 Average Risk   3.4   3.3  Average Risk       5.0   4.4  2 X Average Risk   9.6   7.1  3 X Average Risk  23.4   11.0        Use the calculated Patient Ratio above and the CHD Risk Table to determine the patient's CHD Risk.        ATP III CLASSIFICATION (LDL):  <100     mg/dL   Optimal  100-129  mg/dL   Near or Above                    Optimal  130-159  mg/dL   Borderline  160-189  mg/dL   High  >190     mg/dL   Very High      Additional studies/ records that were reviewed today include:   Echo September 2016: Left ventricle: The cavity size was normal. Wall thickness was normal. Systolic function was normal. The estimated ejection fraction was in the range of 55% to 60%. Wall motion was normal; there were no regional wall motion abnormalities. Doppler parameters are consistent with abnormal left ventricular relaxation (grade 1 diastolic dysfunction). - Aortic valve: There was no stenosis. - Mitral valve: Status post mitral valve repair. Probably mild mitral stenosis. There was no significant regurgitation. Mean gradient (D): 6 mm Hg. Valve area by pressure half-time: 1.3 cm^2. - Right ventricle: The cavity size was normal. Systolic function was mildly reduced. - Pulmonary arteries: No complete TR doppler jet so unable to estimate PA systolic pressure. - Inferior vena cava: The vessel was normal in size. The respirophasic diameter changes were in the normal range (>= 50%), consistent with normal central venous pressure.  Impressions:  - Technically difficult study with poor acoustic windows. Normal LV size with EF 55-60%. Normal RV size with mildly decreased systolic function. Status post mitral valve repair with no significant regurgitation. There is probably mild  mitral stenosis.     ASSESSMENT:    1. Chest pain, unspecified type   2. 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 S/P mitral valve repair   4. Essential hypertension   5. Hyperlipidemia, unspecified hyperlipidemia type      PLAN:  In order of problems listed above:  Chest pain described as a cramping while walking faster overexerts herself relieved immediately with rest. With history of CABG in 2011 recommend exercise Myoview. F/U with Dr. Angelena Form or myself in 1-2 months  Status post CABG in 2011 now with some chest pain  Status post mitral valve repair, no significant murmur on exam and last echo in 2016 stable  Essential hypertension controlled  Hyperlipidemia continue Crestor  Addendum Nuclear stress test 09/20/16 Study Highlights   The patient walked for 7:01 of a Bruce protocol GXT. peak HR of 141 which is 81 % predicted maximal HR . She had ST depression in the anterior leads  Nuclear stress EF: 62%. The left ventricular ejection fraction is normal (55-65%).  Defect 1: There is a large defect of severe severity present in the mid inferolateral, mid anterolateral, apical anterior, apical lateral and apex location.  Findings consistent with ischemia and prior myocardial infarction.  This is a high risk study.     High risk nuclear stress test reviewed with Dr.McAlhany. I spoke with patient and recommend she come in today for lab work and  have cardiac catheterization tomorrow left heart cath with Coronaries, Grafts and possible PCI. Risks and benefits discussed with patient in detail who is agreeable.  Please contact patient with details . I have reviewed the risks, indications, and alternatives to angioplasty and stenting with the patient. Risks include but are not limited to bleeding, infection, vascular injury, stroke, myocardial infection, arrhythmia, kidney injury, radiation-related injury in  the case of prolonged fluoroscopy use, emergency cardiac surgery, and death. The patient understands the risks of serious complication is low (<7%) and he agrees to proceed.

## 2016-09-22 ENCOUNTER — Ambulatory Visit (HOSPITAL_COMMUNITY)
Admission: RE | Admit: 2016-09-22 | Discharge: 2016-09-23 | Disposition: A | Discharge status: Home / Self Care | Payer: Medicare Other | Source: Ambulatory Visit | Attending: Cardiovascular Disease | Admitting: Cardiovascular Disease

## 2016-09-22 ENCOUNTER — Encounter (HOSPITAL_COMMUNITY): Payer: Self-pay | Admitting: General Practice

## 2016-09-22 ENCOUNTER — Encounter (HOSPITAL_COMMUNITY)
Admission: RE | Disposition: A | Discharge status: Home / Self Care | Payer: Self-pay | Source: Ambulatory Visit | Attending: Cardiovascular Disease

## 2016-09-22 DIAGNOSIS — E7800 Pure hypercholesterolemia, unspecified: Secondary | ICD-10-CM | POA: Insufficient documentation

## 2016-09-22 DIAGNOSIS — I25810 Atherosclerosis of coronary artery bypass graft(s) without angina pectoris: Secondary | ICD-10-CM | POA: Diagnosis present

## 2016-09-22 DIAGNOSIS — Z7951 Long term (current) use of inhaled steroids: Secondary | ICD-10-CM | POA: Insufficient documentation

## 2016-09-22 DIAGNOSIS — I110 Hypertensive heart disease with heart failure: Secondary | ICD-10-CM | POA: Insufficient documentation

## 2016-09-22 DIAGNOSIS — I200 Unstable angina: Secondary | ICD-10-CM | POA: Diagnosis present

## 2016-09-22 DIAGNOSIS — G43909 Migraine, unspecified, not intractable, without status migrainosus: Secondary | ICD-10-CM | POA: Diagnosis not present

## 2016-09-22 DIAGNOSIS — Z87891 Personal history of nicotine dependence: Secondary | ICD-10-CM | POA: Insufficient documentation

## 2016-09-22 DIAGNOSIS — Z6837 Body mass index (BMI) 37.0-37.9, adult: Secondary | ICD-10-CM | POA: Diagnosis not present

## 2016-09-22 DIAGNOSIS — Z7982 Long term (current) use of aspirin: Secondary | ICD-10-CM | POA: Diagnosis not present

## 2016-09-22 DIAGNOSIS — I10 Essential (primary) hypertension: Secondary | ICD-10-CM | POA: Diagnosis present

## 2016-09-22 DIAGNOSIS — E1140 Type 2 diabetes mellitus with diabetic neuropathy, unspecified: Secondary | ICD-10-CM | POA: Insufficient documentation

## 2016-09-22 DIAGNOSIS — Z794 Long term (current) use of insulin: Secondary | ICD-10-CM | POA: Insufficient documentation

## 2016-09-22 DIAGNOSIS — I252 Old myocardial infarction: Secondary | ICD-10-CM | POA: Diagnosis not present

## 2016-09-22 DIAGNOSIS — I2582 Chronic total occlusion of coronary artery: Secondary | ICD-10-CM | POA: Diagnosis not present

## 2016-09-22 DIAGNOSIS — Z8249 Family history of ischemic heart disease and other diseases of the circulatory system: Secondary | ICD-10-CM | POA: Insufficient documentation

## 2016-09-22 DIAGNOSIS — I25110 Atherosclerotic heart disease of native coronary artery with unstable angina pectoris: Secondary | ICD-10-CM | POA: Diagnosis not present

## 2016-09-22 DIAGNOSIS — E785 Hyperlipidemia, unspecified: Secondary | ICD-10-CM | POA: Diagnosis not present

## 2016-09-22 DIAGNOSIS — E118 Type 2 diabetes mellitus with unspecified complications: Secondary | ICD-10-CM | POA: Diagnosis present

## 2016-09-22 DIAGNOSIS — I509 Heart failure, unspecified: Secondary | ICD-10-CM | POA: Diagnosis not present

## 2016-09-22 DIAGNOSIS — Z9889 Other specified postprocedural states: Secondary | ICD-10-CM

## 2016-09-22 DIAGNOSIS — I25710 Atherosclerosis of autologous vein coronary artery bypass graft(s) with unstable angina pectoris: Secondary | ICD-10-CM

## 2016-09-22 DIAGNOSIS — Z8614 Personal history of Methicillin resistant Staphylococcus aureus infection: Secondary | ICD-10-CM | POA: Insufficient documentation

## 2016-09-22 DIAGNOSIS — F17200 Nicotine dependence, unspecified, uncomplicated: Secondary | ICD-10-CM | POA: Diagnosis present

## 2016-09-22 HISTORY — PX: CORONARY STENT INTERVENTION: CATH118234

## 2016-09-22 HISTORY — PX: CORONARY STENT PLACEMENT: SHX1402

## 2016-09-22 HISTORY — PX: CARDIAC CATHETERIZATION: SHX172

## 2016-09-22 HISTORY — PX: LEFT HEART CATH AND CORS/GRAFTS ANGIOGRAPHY: CATH118250

## 2016-09-22 HISTORY — DX: Atherosclerotic heart disease of native coronary artery without angina pectoris: I25.10

## 2016-09-22 LAB — GLUCOSE, CAPILLARY
Glucose-Capillary: 200 mg/dL — ABNORMAL HIGH (ref 65–99)
Glucose-Capillary: 162 mg/dL — ABNORMAL HIGH (ref 65–99)
Glucose-Capillary: 228 mg/dL — ABNORMAL HIGH (ref 65–99)
Glucose-Capillary: 170 mg/dL — ABNORMAL HIGH (ref 65–99)
Glucose-Capillary: 165 mg/dL — ABNORMAL HIGH (ref 65–99)

## 2016-09-22 LAB — MRSA PCR SCREENING: MRSA by PCR: NEGATIVE

## 2016-09-22 LAB — POCT ACTIVATED CLOTTING TIME: Activated Clotting Time: 290 seconds

## 2016-09-22 SURGERY — LEFT HEART CATH AND CORS/GRAFTS ANGIOGRAPHY
Anesthesia: LOCAL

## 2016-09-22 MED ORDER — ASPIRIN 81 MG PO CHEW: 81.0000 mg | CHEWABLE_TABLET | ORAL | Status: DC

## 2016-09-22 MED ORDER — HEPARIN (PORCINE) IN NACL 2-0.9 UNIT/ML-% IJ SOLN
INTRAMUSCULAR | Status: AC
  Filled 2016-09-22: qty 1000

## 2016-09-22 MED ORDER — FAMOTIDINE IN NACL 20-0.9 MG/50ML-% IV SOLN
INTRAVENOUS | Status: AC
  Filled 2016-09-22: qty 50

## 2016-09-22 MED ORDER — FAMOTIDINE IN NACL 20-0.9 MG/50ML-% IV SOLN
INTRAVENOUS | Status: AC | PRN
  Administered 2016-09-22: 20 mg via INTRAVENOUS

## 2016-09-22 MED ORDER — SODIUM CHLORIDE 0.9% FLUSH: 3.0000 mL | INTRAVENOUS | Status: DC | PRN

## 2016-09-22 MED ORDER — INSULIN ASPART 100 UNIT/ML ~~LOC~~ SOLN: 12.0000 [IU] | Freq: Every day | SUBCUTANEOUS | Status: DC

## 2016-09-22 MED ORDER — NITROGLYCERIN 1 MG/10 ML FOR IR/CATH LAB
INTRA_ARTERIAL | Status: DC | PRN
  Administered 2016-09-22: 200 ug via INTRACORONARY

## 2016-09-22 MED ORDER — GABAPENTIN 300 MG PO CAPS
300.0000 mg | ORAL_CAPSULE | Freq: Two times a day (BID) | ORAL | Status: DC
  Administered 2016-09-22 – 2016-09-23 (×2): 300 mg via ORAL
  Filled 2016-09-22 (×2): qty 1

## 2016-09-22 MED ORDER — LISINOPRIL 10 MG PO TABS
20.0000 mg | ORAL_TABLET | Freq: Every day | ORAL | Status: DC
  Administered 2016-09-23: 20 mg via ORAL
  Filled 2016-09-22: qty 2

## 2016-09-22 MED ORDER — CLOPIDOGREL BISULFATE 300 MG PO TABS
ORAL_TABLET | ORAL | Status: AC
  Filled 2016-09-22: qty 2

## 2016-09-22 MED ORDER — CARVEDILOL 12.5 MG PO TABS
12.5000 mg | ORAL_TABLET | Freq: Two times a day (BID) | ORAL | Status: DC
  Administered 2016-09-22 – 2016-09-23 (×2): 12.5 mg via ORAL
  Filled 2016-09-22 (×2): qty 1

## 2016-09-22 MED ORDER — CLOPIDOGREL BISULFATE 300 MG PO TABS
ORAL_TABLET | ORAL | Status: DC | PRN
  Administered 2016-09-22: 600 mg via ORAL

## 2016-09-22 MED ORDER — SODIUM CHLORIDE 0.9 % IV SOLN: INTRAVENOUS | Status: DC

## 2016-09-22 MED ORDER — SODIUM CHLORIDE 0.9 % WEIGHT BASED INFUSION: 1.0000 mL/kg/h | INTRAVENOUS | Status: DC

## 2016-09-22 MED ORDER — INSULIN GLARGINE 100 UNIT/ML ~~LOC~~ SOLN
32.0000 [IU] | Freq: Every day | SUBCUTANEOUS | Status: DC
  Filled 2016-09-22: qty 0.32

## 2016-09-22 MED ORDER — INSULIN ASPART 100 UNIT/ML ~~LOC~~ SOLN
0.0000 [IU] | Freq: Three times a day (TID) | SUBCUTANEOUS | Status: DC
  Administered 2016-09-22 – 2016-09-23 (×2): 3 [IU] via SUBCUTANEOUS

## 2016-09-22 MED ORDER — HYDRALAZINE HCL 20 MG/ML IJ SOLN: 5.0000 mg | INTRAMUSCULAR | Status: DC | PRN

## 2016-09-22 MED ORDER — FENTANYL CITRATE (PF) 100 MCG/2ML IJ SOLN
INTRAMUSCULAR | Status: DC | PRN
Start: 2016-09-22 — End: 2016-09-22
  Administered 2016-09-22 (×2): 25 ug via INTRAVENOUS

## 2016-09-22 MED ORDER — OXYCODONE-ACETAMINOPHEN 5-325 MG PO TABS
1.0000 | ORAL_TABLET | Freq: Four times a day (QID) | ORAL | Status: DC | PRN
  Administered 2016-09-22 – 2016-09-23 (×2): 1 via ORAL
  Filled 2016-09-22 (×2): qty 1

## 2016-09-22 MED ORDER — FENTANYL CITRATE (PF) 100 MCG/2ML IJ SOLN
INTRAMUSCULAR | Status: AC
  Filled 2016-09-22: qty 2

## 2016-09-22 MED ORDER — SODIUM CHLORIDE 0.9 % WEIGHT BASED INFUSION
3.0000 mL/kg/h | INTRAVENOUS | Status: DC
  Administered 2016-09-22: 3 mL/kg/h via INTRAVENOUS

## 2016-09-22 MED ORDER — MONTELUKAST SODIUM 10 MG PO TABS
10.0000 mg | ORAL_TABLET | Freq: Every day | ORAL | Status: DC
  Administered 2016-09-22: 10 mg via ORAL
  Filled 2016-09-22: qty 1

## 2016-09-22 MED ORDER — OXYCODONE HCL 5 MG PO TABS
5.0000 mg | ORAL_TABLET | Freq: Four times a day (QID) | ORAL | Status: DC | PRN
  Administered 2016-09-22 – 2016-09-23 (×2): 5 mg via ORAL
  Filled 2016-09-22 (×2): qty 1

## 2016-09-22 MED ORDER — SODIUM CHLORIDE 0.9% FLUSH: 3.0000 mL | Freq: Two times a day (BID) | INTRAVENOUS | Status: DC

## 2016-09-22 MED ORDER — LIDOCAINE HCL (PF) 1 % IJ SOLN
INTRAMUSCULAR | Status: DC | PRN
  Administered 2016-09-22: 2 mL

## 2016-09-22 MED ORDER — CLOPIDOGREL BISULFATE 300 MG PO TABS
ORAL_TABLET | ORAL | Status: AC
  Filled 2016-09-22: qty 1

## 2016-09-22 MED ORDER — IOPAMIDOL (ISOVUE-370) INJECTION 76%
INTRAVENOUS | Status: AC
  Filled 2016-09-22: qty 100

## 2016-09-22 MED ORDER — MIDAZOLAM HCL 2 MG/2ML IJ SOLN
INTRAMUSCULAR | Status: DC | PRN
  Administered 2016-09-22: 2 mg via INTRAVENOUS

## 2016-09-22 MED ORDER — VERAPAMIL HCL 2.5 MG/ML IV SOLN
INTRAVENOUS | Status: AC
  Filled 2016-09-22: qty 2

## 2016-09-22 MED ORDER — FLUTICASONE PROPIONATE 50 MCG/ACT NA SUSP
1.0000 | Freq: Two times a day (BID) | NASAL | Status: DC
  Administered 2016-09-22: 20:00:00 1 via NASAL
  Filled 2016-09-22: qty 16

## 2016-09-22 MED ORDER — HEPARIN (PORCINE) IN NACL 2-0.9 UNIT/ML-% IJ SOLN
INTRAMUSCULAR | Status: AC | PRN
  Administered 2016-09-22: 1000 mL

## 2016-09-22 MED ORDER — IOPAMIDOL (ISOVUE-370) INJECTION 76%
INTRAVENOUS | Status: AC
  Filled 2016-09-22: qty 125

## 2016-09-22 MED ORDER — SODIUM CHLORIDE 0.9% FLUSH
3.0000 mL | Freq: Two times a day (BID) | INTRAVENOUS | Status: DC
  Administered 2016-09-22 (×2): 3 mL via INTRAVENOUS

## 2016-09-22 MED ORDER — OXYCODONE-ACETAMINOPHEN 10-325 MG PO TABS: 1.0000 | ORAL_TABLET | Freq: Four times a day (QID) | ORAL | Status: DC | PRN

## 2016-09-22 MED ORDER — ROSUVASTATIN CALCIUM 10 MG PO TABS
10.0000 mg | ORAL_TABLET | Freq: Every evening | ORAL | Status: DC
  Administered 2016-09-22: 18:00:00 10 mg via ORAL
  Filled 2016-09-22: qty 1

## 2016-09-22 MED ORDER — HEPARIN SODIUM (PORCINE) 1000 UNIT/ML IJ SOLN
INTRAMUSCULAR | Status: AC
  Filled 2016-09-22: qty 1

## 2016-09-22 MED ORDER — CLOPIDOGREL BISULFATE 75 MG PO TABS
75.0000 mg | ORAL_TABLET | Freq: Every day | ORAL | Status: DC
  Administered 2016-09-23: 75 mg via ORAL
  Filled 2016-09-22: qty 1

## 2016-09-22 MED ORDER — LABETALOL HCL 5 MG/ML IV SOLN: 10.0000 mg | INTRAVENOUS | Status: DC | PRN

## 2016-09-22 MED ORDER — INSULIN ASPART 100 UNIT/ML ~~LOC~~ SOLN: 0.0000 [IU] | Freq: Every day | SUBCUTANEOUS | Status: DC

## 2016-09-22 MED ORDER — LIDOCAINE HCL 1 % IJ SOLN
INTRAMUSCULAR | Status: AC
  Filled 2016-09-22: qty 20

## 2016-09-22 MED ORDER — NITROGLYCERIN 1 MG/10 ML FOR IR/CATH LAB
INTRA_ARTERIAL | Status: AC
  Filled 2016-09-22: qty 10

## 2016-09-22 MED ORDER — HEPARIN SODIUM (PORCINE) 1000 UNIT/ML IJ SOLN
INTRAMUSCULAR | Status: DC | PRN
  Administered 2016-09-22 (×2): 5000 [IU] via INTRAVENOUS

## 2016-09-22 MED ORDER — ASPIRIN 81 MG PO CHEW
81.0000 mg | CHEWABLE_TABLET | Freq: Every day | ORAL | Status: DC
  Administered 2016-09-23: 81 mg via ORAL
  Filled 2016-09-22: qty 1

## 2016-09-22 MED ORDER — AMLODIPINE BESYLATE 5 MG PO TABS
5.0000 mg | ORAL_TABLET | Freq: Two times a day (BID) | ORAL | Status: DC
  Filled 2016-09-22: qty 1

## 2016-09-22 MED ORDER — FENTANYL CITRATE (PF) 100 MCG/2ML IJ SOLN
INTRAMUSCULAR | Status: DC | PRN
  Administered 2016-09-22: 25 ug via INTRAVENOUS

## 2016-09-22 MED ORDER — SODIUM CHLORIDE 0.9 % IV SOLN: 250.0000 mL | INTRAVENOUS | Status: DC | PRN

## 2016-09-22 MED ORDER — ACETAMINOPHEN 325 MG PO TABS: 650.0000 mg | ORAL_TABLET | ORAL | Status: DC | PRN

## 2016-09-22 MED ORDER — MIDAZOLAM HCL 2 MG/2ML IJ SOLN
INTRAMUSCULAR | Status: AC
  Filled 2016-09-22: qty 2

## 2016-09-22 MED ORDER — INSULIN ASPART 100 UNIT/ML ~~LOC~~ SOLN: 10.0000 [IU] | Freq: Three times a day (TID) | SUBCUTANEOUS | Status: DC

## 2016-09-22 MED ORDER — ONDANSETRON HCL 4 MG/2ML IJ SOLN: 4.0000 mg | Freq: Four times a day (QID) | INTRAMUSCULAR | Status: DC | PRN

## 2016-09-22 MED ORDER — VERAPAMIL HCL 2.5 MG/ML IV SOLN
INTRAVENOUS | Status: DC | PRN
  Administered 2016-09-22: 10 mL via INTRA_ARTERIAL

## 2016-09-22 MED ORDER — INSULIN ASPART 100 UNIT/ML ~~LOC~~ SOLN
10.0000 [IU] | SUBCUTANEOUS | Status: DC
  Administered 2016-09-22: 10 [IU] via SUBCUTANEOUS

## 2016-09-22 SURGICAL SUPPLY — 24 items
BALLN SAPPHIRE 2.5X15 (BALLOONS) ×2
BALLN SAPPHIRE ~~LOC~~ 2.75X15 (BALLOONS) ×2 IMPLANT
BALLN SAPPHIRE ~~LOC~~ 3.0X15 (BALLOONS) ×2 IMPLANT
BALLOON SAPPHIRE 2.5X15 (BALLOONS) ×1 IMPLANT
CATH INFINITI 5 FR LCB (CATHETERS) ×2 IMPLANT
CATH INFINITI 5FR MULTPACK ANG (CATHETERS) ×2 IMPLANT
CATH LAUNCHER 6FR AL1 SH (CATHETERS) ×1 IMPLANT
CATHETER LAUNCHER 6FR AL1 SH (CATHETERS) ×2
DEVICE RAD COMP TR BAND LRG (VASCULAR PRODUCTS) ×2 IMPLANT
DEVICE SPIDERFX EMB PROT 4MM (WIRE) ×2 IMPLANT
GLIDESHEATH SLEND SS 6F .021 (SHEATH) ×2 IMPLANT
GUIDEWIRE INQWIRE 1.5J.035X260 (WIRE) ×1 IMPLANT
INQWIRE 1.5J .035X260CM (WIRE) ×2
KIT ENCORE 26 ADVANTAGE (KITS) ×2 IMPLANT
KIT HEART LEFT (KITS) ×2 IMPLANT
PACK CARDIAC CATHETERIZATION (CUSTOM PROCEDURE TRAY) ×2 IMPLANT
STENT RESOLUTE ONYX 2.75X22 (Permanent Stent) ×2 IMPLANT
STENT RESOLUTE ONYX 3.0X12 (Permanent Stent) ×2 IMPLANT
STENT RESOLUTE ONYX 3.0X15 (Permanent Stent) ×2 IMPLANT
SYR MEDRAD MARK V 150ML (SYRINGE) ×2 IMPLANT
TRANSDUCER W/STOPCOCK (MISCELLANEOUS) ×2 IMPLANT
TUBING CIL FLEX 10 FLL-RA (TUBING) ×2 IMPLANT
WIRE COUGAR XT STRL 190CM (WIRE) ×2 IMPLANT
WIRE HI TORQ WHISPER MS 190CM (WIRE) IMPLANT

## 2016-09-22 NOTE — H&P (View-Only) (Signed)
Cardiology Office Note    Date:  09/04/2016   ID:  Elizabeth Park, DOB 17-Aug-1970, MRN [id number]  PCP:  Lucianne Lei, MD  Cardiologist: Dr. Angelena Form  Chief Complaint  Patient presents with  . Follow-up    chest discomfort    History of Present Illness:  Elizabeth Park is a 46 y.o. female with history of CAD status post CABG 4 in 2011 with a LIMA to the LAD, SVG to subbranch of ramus intermediate, SVG to lateral subbranch of ramus intermediate, SVG to distal RCA, and MVR, hypertension, HLD, DM, MVP. Last echo 4098 normal LV systolic function is stable mitral valve repair. Last saw Dr. Angelena Form 12/2015 and doing well. She was cleared for gastric band surgery but it's now scheduled for this July.  Patient comes in today complaining of "cramp" in left chest when she over exerts herself or walks fast. Goes away as soon as she stops. Different from her prior chest pain. Says it's not a tightness, doesn't radiate, no shortness of breath.  Just started a month ago. She thinks it's because she gained some weight and just started getting more active in her yard. She was picking up rocks and pulling weeds yesterday without any symptoms.  Past Medical History:  Diagnosis Date  . CHF (congestive heart failure) ([place])   . Diabetes mellitus   . H/O emotional problems   . Headache, migraine   . Heart attack ([place])   . High cholesterol   . History of blood transfusion   . Hypertension   . MI (myocardial infarction) ([place])   . MRSA (methicillin resistant Staphylococcus aureus)   . MVP (mitral valve prolapse)   . Neuromuscular disorder ([place])   . Neuropathy   . Sebaceous cyst     Past Surgical History:  Procedure Laterality Date  . ABDOMINAL HYSTERECTOMY    . CORONARY ARTERY BYPASS GRAFT  with valve repair  . SKIN GRAFT      Current Medications: Outpatient Medications Prior to Visit  Medication Sig Dispense Refill  . aspirin 81 MG tablet Take 81 mg by mouth daily.     . carvedilol (COREG) 12.5  MG tablet Take 12.5 mg by mouth 2 (two) times daily.    . fluticasone (FLONASE) 50 MCG/ACT nasal spray Place into both nostrils daily.    Marland Kitchen gabapentin (NEURONTIN) 600 MG tablet Take 600 mg by mouth daily.    . insulin aspart (NOVOLOG FLEXPEN) 100 UNIT/ML injection Inject 10-12 Units into the skin 3 (three) times daily with meals. 10 units at breakfast  10 units at lunch and 12 units at dinner    . lisinopril (PRINIVIL,ZESTRIL) 20 MG tablet Take 20 mg by mouth daily.    . montelukast (SINGULAIR) 10 MG tablet Take 10 mg by mouth at bedtime.    . Naloxone HCl (EVZIO) 0.4 MG/0.4ML SOAJ Inject as directed as needed (for possible overdose).     Marland Kitchen oxyCODONE-acetaminophen (PERCOCET) 10-325 MG per tablet Take 1 tablet by mouth 3 (three) times daily.    . rosuvastatin (CRESTOR) 10 MG tablet Take 10 mg by mouth daily.    . insulin glargine (LANTUS SOLOSTAR) 100 UNIT/ML injection Inject 28 Units into the skin at bedtime.    . pantoprazole (PROTONIX) 40 MG tablet TAKE 1 TABLET (40 MG TOTAL) BY MOUTH DAILY. 30 tablet 1   No facility-administered medications prior to visit.      Allergies:   Metformin   Social History   Social History  .  Marital status: Divorced    Spouse name: N/A  . Number of children: 2  . Years of education: N/A   Occupational History  . Disability    Social History Main Topics  . Smoking status: Former Smoker    Packs/day: 1.00    Years: 28.00    Types: Cigarettes    Quit date: 03/31/2009  . Smokeless tobacco: Never Used  . Alcohol use No  . Drug use: No  . Sexual activity: Yes    Birth control/ protection: Surgical     Comment: partial hysterectomy   Other Topics Concern  . None   Social History Narrative  . None     Family History:  The patient's family history includes Arthritis in her father; Asthma in her father; Diabetes in her brother, father, and mother; Emphysema in her maternal grandmother; Heart disease in her father and mother; Hypertension in her  brother, father, and mother; Migraines in her mother; Stroke in her mother.   ROS:   Please see the history of present illness.    Review of Systems  Constitution: Negative.  HENT: Negative.   Eyes: Negative.   Cardiovascular: Positive for chest pain.  Respiratory: Negative.   Hematologic/Lymphatic: Negative.   Musculoskeletal: Negative.  Negative for joint pain.  Gastrointestinal: Negative.   Genitourinary: Negative.   Neurological: Negative.    All other systems reviewed and are negative.   PHYSICAL EXAM:   VS:  BP 120/78   Pulse 65   Ht 5\' 4"  (1.626 m)   Wt 216 lb 1.9 oz (98 kg)   SpO2 97%   BMI 37.10 kg/m   Physical Exam  GEN: Obese, in no acute distress  Neck: no JVD, carotid bruits, or masses Cardiac: Distant heart sounds, RRR; no murmurs, rubs, or gallops  Respiratory:  clear to auscultation bilaterally, normal work of breathing GI: soft, nontender, nondistended, + BS Ext: without cyanosis, clubbing, or edema, Good distal pulses bilaterally Neuro:  Alert and Oriented x 3 Psych: euthymic mood, full affect  Wt Readings from Last 3 Encounters:  09/04/16 216 lb 1.9 oz (98 kg)  08/09/16 213 lb 6.4 oz (96.8 kg)  07/12/16 210 lb 12.8 oz (95.6 kg)      Studies/Labs Reviewed:   EKG:  EKG is  ordered today.  The ekg ordered today demonstrates normal sinus rhythm nonspecific ST-T wave changes, no acute change  Recent Labs: No results found for requested labs within last 8760 hours.   Lipid Panel    Component Value Date/Time   CHOL (H) 04/03/2009 0320    204        ATP III CLASSIFICATION:  <200     mg/dL   Desirable  200-239  mg/dL   Borderline High  >=240    mg/dL   High          TRIG 269 (H) 04/03/2009 0320   HDL 23 (L) 04/03/2009 0320   CHOLHDL 8.9 04/03/2009 0320   VLDL 54 (H) 04/03/2009 0320   LDLCALC (H) 04/03/2009 0320    127        Total Cholesterol/HDL:CHD Risk Coronary Heart Disease Risk Table                     Men   Women  1/2 Average Risk    3.4   3.3  Average Risk       5.0   4.4  2 X Average Risk   9.6   7.1  3  X Average Risk  23.4   11.0        Use the calculated Patient Ratio above and the CHD Risk Table to determine the patient's CHD Risk.        ATP III CLASSIFICATION (LDL):  <100     mg/dL   Optimal  100-129  mg/dL   Near or Above                    Optimal  130-159  mg/dL   Borderline  160-189  mg/dL   High  >190     mg/dL   Very High    Additional studies/ records that were reviewed today include:   Echo September 2016: Left ventricle: The cavity size was normal. Wall thickness was   normal. Systolic function was normal. The estimated ejection   fraction was in the range of 55% to 60%. Wall motion was normal;   there were no regional wall motion abnormalities. Doppler   parameters are consistent with abnormal left ventricular   relaxation (grade 1 diastolic dysfunction). - Aortic valve: There was no stenosis. - Mitral valve: Status post mitral valve repair. Probably mild   mitral stenosis. There was no significant regurgitation. Mean   gradient (D): 6 mm Hg. Valve area by pressure half-time: 1.3   cm^2. - Right ventricle: The cavity size was normal. Systolic function   was mildly reduced. - Pulmonary arteries: No complete TR doppler jet so unable to   estimate PA systolic pressure. - Inferior vena cava: The vessel was normal in size. The   respirophasic diameter changes were in the normal range (>= 50%),   consistent with normal central venous pressure.   Impressions:   - Technically difficult study with poor acoustic windows. Normal LV   size with EF 55-60%. Normal RV size with mildly decreased   systolic function. Status post mitral valve repair with no   significant regurgitation. There is probably mild mitral   stenosis.      ASSESSMENT:    1. Chest pain, unspecified type   2. 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 S/P mitral  valve repair   4. Essential hypertension   5. Hyperlipidemia, unspecified hyperlipidemia type      PLAN:  In order of problems listed above:  Chest pain described as a cramping while walking faster overexerts herself relieved immediately with rest. With history of CABG in 2011 recommend exercise Myoview. F/U with Dr. Angelena Form or myself in 1-2 months  Status post CABG in 2011 now with some chest pain  Status post mitral valve repair, no significant murmur on exam and last echo in 2016 stable  Essential hypertension controlled  Hyperlipidemia continue Crestor  Medication Adjustments/Labs and Tests Ordered: Current medicines are reviewed at length with the patient today.  Concerns regarding medicines are outlined above.  Medication changes, Labs and Tests ordered today are listed in the Patient Instructions below. There are no Patient Instructions on file for this visit.   Sumner Boast, PA-C  09/04/2016 11:08 AM    [facility] [address], [place], [place]  [address] Phone: [phone]; Fax: [phone]

## 2016-09-22 NOTE — Progress Notes (Signed)
TR BAND REMOVAL  LOCATION:    left radial  DEFLATED PER PROTOCOL:    Yes.    TIME BAND OFF / DRESSING APPLIED:    1515   SITE UPON ARRIVAL:    Level 0  SITE AFTER BAND REMOVAL:    Level 0  CIRCULATION SENSATION AND MOVEMENT:    Within Normal Limits   Yes.    COMMENTS:   Tolerated procedure well

## 2016-09-22 NOTE — Progress Notes (Signed)
Inpatient Diabetes Program Recommendations  AACE/ADA: New Consensus Statement on Inpatient Glycemic Control (2015)  Target Ranges:  Prepandial:   less than 140 mg/dL      Peak postprandial:   less than 180 mg/dL (1-2 hours)      Critically ill patients:  140 - 180 mg/dL   Results for [place], Elizabeth Park (MRN [id number]) as of 09/22/2016 14:07  Ref. Range 09/22/2016 05:45 09/22/2016 09:40 09/22/2016 12:49  Glucose-Capillary Latest Ref Range: 65 - 99 mg/dL 200 (H) 162 (H) 228 (H)    Admit with: Cardiac Cath  History: DM  Home DM Meds: Tyler Aas Insulin 40 units QHS       Novolog 10 units Breakfast/ 10 units Lunch/ 12 units Dinner       Janumet 50/1000 mg BID  Current Insulin Orders: Novolog 10 units Breakfast/ 10 units Lunch/ 12 units Dinner      Called Elizabeth Bellis, NP with Cardiology.  Discussed pt's home diabetes medications with NP.  Permission given to me to start patient on Lantus 32 units QHS tonight (80% home dose of basal insulin) + Novolog Moderate (0-15 units) TID AC + HS.  Orders entered into [facility].     --Will follow patient during hospitalization--  Wyn Quaker RN, MSN, CDE Diabetes Coordinator Inpatient Glycemic Control Team Team Pager: [phone] (8a-5p)

## 2016-09-22 NOTE — Interval H&P Note (Signed)
History and Physical Interval Note:  09/22/2016 7:24 AM  Ayumi AMAIYA SCRUTON  has presented today for cardiac cath with the diagnosis of unstable angina/abnormal stress test  The various methods of treatment have been discussed with the patient and family. After consideration of risks, benefits and other options for treatment, the patient has consented to  Procedure(s): Left Heart Cath and Cors/Grafts Angiography (N/A) as a surgical intervention .  The patient's history has been reviewed, patient examined, no change in status, stable for surgery.  I have reviewed the patient's chart and labs.  Questions were answered to the patient's satisfaction.    Cath Lab Visit (complete for each Cath Lab visit)  Clinical Evaluation Leading to the Procedure:   ACS: No.  Non-ACS:    Anginal Classification: CCS II  Anti-ischemic medical therapy: Maximal Therapy (2 or more classes of medications)  Non-Invasive Test Results: High-risk stress test findings: cardiac mortality >3%/year  Prior CABG: Previous CABG         Lauree Chandler

## 2016-09-23 ENCOUNTER — Encounter (HOSPITAL_COMMUNITY): Payer: Self-pay | Admitting: Physician Assistant

## 2016-09-23 DIAGNOSIS — I2510 Atherosclerotic heart disease of native coronary artery without angina pectoris: Secondary | ICD-10-CM

## 2016-09-23 DIAGNOSIS — E785 Hyperlipidemia, unspecified: Secondary | ICD-10-CM | POA: Diagnosis not present

## 2016-09-23 DIAGNOSIS — I200 Unstable angina: Secondary | ICD-10-CM | POA: Diagnosis not present

## 2016-09-23 DIAGNOSIS — E1140 Type 2 diabetes mellitus with diabetic neuropathy, unspecified: Secondary | ICD-10-CM | POA: Diagnosis not present

## 2016-09-23 DIAGNOSIS — I110 Hypertensive heart disease with heart failure: Secondary | ICD-10-CM | POA: Diagnosis not present

## 2016-09-23 DIAGNOSIS — Z8614 Personal history of Methicillin resistant Staphylococcus aureus infection: Secondary | ICD-10-CM | POA: Diagnosis not present

## 2016-09-23 DIAGNOSIS — E7800 Pure hypercholesterolemia, unspecified: Secondary | ICD-10-CM | POA: Diagnosis not present

## 2016-09-23 DIAGNOSIS — Z794 Long term (current) use of insulin: Secondary | ICD-10-CM | POA: Diagnosis not present

## 2016-09-23 DIAGNOSIS — I509 Heart failure, unspecified: Secondary | ICD-10-CM | POA: Diagnosis not present

## 2016-09-23 DIAGNOSIS — I252 Old myocardial infarction: Secondary | ICD-10-CM | POA: Diagnosis not present

## 2016-09-23 DIAGNOSIS — I2582 Chronic total occlusion of coronary artery: Secondary | ICD-10-CM | POA: Diagnosis not present

## 2016-09-23 DIAGNOSIS — G43909 Migraine, unspecified, not intractable, without status migrainosus: Secondary | ICD-10-CM | POA: Diagnosis not present

## 2016-09-23 DIAGNOSIS — I25710 Atherosclerosis of autologous vein coronary artery bypass graft(s) with unstable angina pectoris: Secondary | ICD-10-CM | POA: Diagnosis not present

## 2016-09-23 DIAGNOSIS — Z7982 Long term (current) use of aspirin: Secondary | ICD-10-CM | POA: Diagnosis not present

## 2016-09-23 LAB — BASIC METABOLIC PANEL
Anion gap: 8 (ref 5–15)
BUN: 18 mg/dL (ref 6–20)
CO2: 22 mmol/L (ref 22–32)
Calcium: 9.4 mg/dL (ref 8.9–10.3)
Chloride: 102 mmol/L (ref 101–111)
Creatinine, Ser: 0.75 mg/dL (ref 0.44–1.00)
GFR calc Af Amer: >60 mL/min (ref >60)
GFR calc non Af Amer: >60 mL/min (ref >60)
Glucose, Bld: 184 mg/dL — ABNORMAL HIGH (ref 65–99)
Potassium: 3.8 mmol/L (ref 3.5–5.1)
Sodium: 132 mmol/L — ABNORMAL LOW (ref 135–145)

## 2016-09-23 LAB — CBC
HCT: 38.7 % (ref 36.0–46.0)
Hemoglobin: 12.9 g/dL (ref 12.0–15.0)
MCH: 30.6 pg (ref 26.0–34.0)
MCHC: 33.3 g/dL (ref 30.0–36.0)
MCV: 91.9 fL (ref 78.0–100.0)
Platelets: 278 10*3/uL (ref 150–400)
RBC: 4.21 MIL/uL (ref 3.87–5.11)
RDW: 12.7 % (ref 11.5–15.5)
WBC: 13.1 10*3/uL — ABNORMAL HIGH (ref 4.0–10.5)

## 2016-09-23 LAB — GLUCOSE, CAPILLARY: Glucose-Capillary: 186 mg/dL — ABNORMAL HIGH (ref 65–99)

## 2016-09-23 MED ORDER — ANGIOPLASTY BOOK
Freq: Once | Status: AC
  Administered 2016-09-23: 06:00:00
  Filled 2016-09-23: qty 1

## 2016-09-23 MED ORDER — CLOPIDOGREL BISULFATE 75 MG PO TABS: 75.0000 mg | ORAL_TABLET | Freq: Every day | ORAL | 4 refills | Status: DC

## 2016-09-23 NOTE — Discharge Instructions (Signed)

## 2016-09-23 NOTE — Discharge Summary (Signed)
Discharge Summary    Patient ID: Elizabeth Park,  MRN: [id number], DOB/AGE: 48-Oct-1972 48 y.o.  Admit date: 09/22/2016 Discharge date: 09/23/2016  Primary Care Provider: Lucianne Lei Primary Cardiologist: Dr. Angelena Form    Discharge Diagnoses    Principal Problem:   Unstable angina [facility]) Active Problems:   Type 2 diabetes mellitus with complication ([place])   Hyperlipidemia   TOBACCO ABUSE   Essential hypertension   CAD, ARTERY BYPASS GRAFT   TOBACCO USE, QUIT   S/P mitral valve repair   Allergies Allergies  Allergen Reactions  . Metformin Diarrhea and Nausea And Vomiting    Immediate release tablets (not metformin ER, pt currently taking)     History of Present Illness     Elizabeth Park is a 46 y.o. female with history of CAD status post CABG 4 in 2011, MVR, hypertension, HLD, DM, MVP who presented to [facility] on 09/22/16 for outpatient cardiac cath  Patient has a history of CABG 4 in 2011 with a LIMA to the LAD, SVG to subbranch of ramus intermediate, SVG to lateral subbranch of ramus intermediate, SVG to distal RCA, and MVR. Last echo 0923 normal LV systolic function is stable mitral valve repair. Last saw Dr. Angelena Form 12/2015 and doing well. She was cleared for gastric band surgery but it's now scheduled for this July.   She was seen in the office by Pecola Leisure PA-C on 09/04/16 for an exertional "cramp" in her chest. Subsequent GXT Myoview on 09/20/16 was high risk with ST depression in anterior leads and a large defect of severe severity in the mid inferolateral, mid anterolateral, apical anterior, apical lateral and apex region. She was set up for cardiac catheterization on [place] [facility] Course     Consultants: none  Cardiac catheterization on 09/22/16 showed severe triple CAD s/p 4/4 CABG with 2/4 patent grafts. She underwent PCTA/DES to SVG--> PDA.  She also underwent PTCA/DES proximal intermediate branch and PTCA/DES to pLAD. Per cath note, he will continue DPT with  ASA and Plavix for one year and she cannot interrupt this for elective surgery  (she will have to postpone gastric bypass surgery). Will hold Meformin 48 hours after cath.    The patient has had an uncomplicated hospital course and is recovering well. The radial catheter site is stable.  She has been seen by Dr. Marlou Porch today and deemed ready for discharge home. A  staff message for follow-up appointment has been sent. Discharge medications are listed below.  _____________  Discharge Vitals Blood pressure 108/62, pulse 67, temperature 98.9 F (37.2 C), temperature source Oral, resp. rate 15, height 5\' 4"  (1.626 m), weight 225 lb 8.5 oz (102.3 kg), SpO2 97 %.  Filed Weights   09/22/16 0536 09/23/16 0438  Weight: 215 lb (97.5 kg) 225 lb 8.5 oz (102.3 kg)    Labs & Radiologic Studies     CBC  Recent Labs  09/21/16 1040 09/23/16 0425  WBC 11.3* 13.1*  NEUTROABS 8.3*  --   HGB 14.8 12.9  HCT 42.5 38.7  MCV 92 91.9  PLT 320 300   Basic Metabolic Panel  Recent Labs  09/21/16 1040 09/23/16 0425  NA 135 132*  K 5.0 3.8  CL 96 102  CO2 20 22  GLUCOSE 249* 184*  BUN 20 18  CREATININE 0.85 0.75  CALCIUM 10.1 9.4   Liver Function Tests No results for input(s): AST, ALT, ALKPHOS, BILITOT, PROT, ALBUMIN in the last 72 hours. No results  for input(s): LIPASE, AMYLASE in the last 72 hours. Cardiac Enzymes No results for input(s): CKTOTAL, CKMB, CKMBINDEX, TROPONINI in the last 72 hours. BNP Invalid input(s): POCBNP D-Dimer No results for input(s): DDIMER in the last 72 hours. Hemoglobin A1C No results for input(s): HGBA1C in the last 72 hours. Fasting Lipid Panel No results for input(s): CHOL, HDL, LDLCALC, TRIG, CHOLHDL, LDLDIRECT in the last 72 hours. Thyroid Function Tests No results for input(s): TSH, T4TOTAL, T3FREE, THYROIDAB in the last 72 hours.  Invalid input(s): FREET3  No results found.   Diagnostic Studies/Procedures    09/22/16 Coronary Stent  Intervention  Left Heart Cath and Cors/Grafts Angiography  Conclusion     Prox RCA lesion, 100 %stenosed.  SVG graft was visualized by angiography and is normal in caliber.  Dist RCA lesion, 100 %stenosed.  A STENT RESOLUTE ONYX 3.0X12 drug eluting stent was successfully placed.  Origin to Prox Graft lesion, 99 %stenosed.  Post intervention, there is a 0% residual stenosis.  Lat Ramus lesion, 100 %stenosed.  SVG graft was visualized by angiography.  Mid Graft lesion, 100 %stenosed.  SVG.  LIMA graft was visualized by angiography and is normal in caliber.  Origin lesion, 100 %stenosed.  A STENT RESOLUTE ONYX 3.0X15 drug eluting stent was successfully placed.  LM lesion, 80 %stenosed.  Post intervention, there is a 0% residual stenosis.  Ost Ramus to Ramus lesion, 95 %stenosed.  Post intervention, there is a 0% residual stenosis.  A STENT RESOLUTE ONYX [id number] drug eluting stent was successfully placed.  The left ventricular systolic function is normal.  LV end diastolic pressure is normal.  The left ventricular ejection fraction is 55-65% by visual estimate.  There is no mitral valve regurgitation.   1. Severe triple vessel CAD s/p 4/4 CABG with 2/4 patent grafts 2. 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 This vessel is small and appears to be diffusely diseased.  3. The RCA is a large dominant vessel with chronic proximal occlusion. The vein graft to the distal RCA is patent but there is a severe stenosis in the proximal body of the SVG to the PDA. Successful PTCA/DES placement in the proximal body of the SVG to the PDA.  4. The intermediate is a large branch with 95% proximal stenosis. Both vein grafts to this vessel and sub-branch are occluded. Successful PTCA/DES x 1 proximal intermediate branch.  5. The origin of the left vessel is essentially a long segment that gives rise to the LAD proper and intermediate branch. The proximal segment of  this vessel has a 80% stenosis. The LAD is protected by the LIMA graft. Successful PTCA/DES proximal LAD.  6. Normal LV systolic function  Recommendations: Continue DAPT with ASA and Plavix for one year. Cannot interrupt for elective surgery. Continue statin and beta blocker.      _____________   09/20/16 Study Highlights   The patient walked for 7:01 of a Bruce protocol GXT. peak HR of 141 which is 81 % predicted maximal HR . She had ST depression in the anterior leads  Nuclear stress EF: 62%. The left ventricular ejection fraction is normal (55-65%).  Defect 1: There is a large defect of severe severity present in the mid inferolateral, mid anterolateral, apical anterior, apical lateral and apex location.  Findings consistent with ischemia and prior myocardial infarction.  This is a high risk study.     Disposition   Pt is being discharged home today in good condition.  Follow-up Plans &  Appointments    Follow-up Information    Burnell Blanks, MD Follow up.   Specialty:  Cardiology Why:  The office will call you to arrange an appointment in 1-2 weeks Contact information: [address] [place] [address] [phone]          Discharge Instructions    Amb Referral to Cardiac Rehabilitation    Complete by:  As directed    Diagnosis:   PTCA Coronary Stents        Discharge Medications     Medication List    TAKE these medications   amLODipine 5 MG tablet Commonly known as:  NORVASC Take 5 mg by mouth 2 (two) times daily.   aspirin 81 MG tablet Take 81 mg by mouth daily.   aspirin-acetaminophen-caffeine 250-250-65 MG tablet Commonly known as:  EXCEDRIN MIGRAINE Take 2 tablets by mouth daily as needed for headache.   carvedilol 12.5 MG tablet Commonly known as:  COREG Take 12.5 mg by mouth 2 (two) times daily.   clopidogrel 75 MG tablet Commonly known as:  PLAVIX Take 1 tablet (75 mg total) by mouth daily with  breakfast. Start taking on:  09/24/2016   EVZIO 0.4 MG/0.4ML Soaj Generic drug:  Naloxone HCl Inject 0.4 mg as directed as needed (for possible overdose).   fluticasone 50 MCG/ACT nasal spray Commonly known as:  FLONASE Place 1 spray into both nostrils 2 (two) times daily.   gabapentin 300 MG capsule Commonly known as:  NEURONTIN Take 300 mg by mouth 2 (two) times daily.   JANUMET XR 50-1000 MG Tb24 Generic drug:  SitaGLIPtin-MetFORMIN HCl Take 1 tablet by mouth 2 (two) times daily.   lisinopril 20 MG tablet Commonly known as:  PRINIVIL,ZESTRIL Take 20 mg by mouth daily.   montelukast 10 MG tablet Commonly known as:  SINGULAIR Take 10 mg by mouth at bedtime.   NOVOLOG FLEXPEN 100 UNIT/ML injection Generic drug:  insulin aspart Inject 0-12 Units into the skin See admin instructions. Per sliding scale Under 150 no units 150-199 take 8 units 200-249 take 10 units 250-299 take 12 units   oxyCODONE-acetaminophen 10-325 MG tablet Commonly known as:  PERCOCET Take 1 tablet by mouth 3 (three) times daily.   rosuvastatin 10 MG tablet Commonly known as:  CRESTOR Take 10 mg by mouth every evening.   SLEEP AID PO Take 1 tablet by mouth at bedtime as needed (sleep).   TRESIBA FLEXTOUCH 100 UNIT/ML Sopn FlexTouch Pen Generic drug:  insulin degludec Inject 40 Units as directed at bedtime.         Outstanding Labs/Studies   none  Duration of Discharge Encounter   Greater than 30 minutes including physician time.  Signed, Angelena Form PA-C 09/23/2016, 9:43 AM  Personally seen and examined. Agree with above. Feels well, no CP, no SOB.  RRR, CTAB, cath site normal, no edema, Obese   - Discussed importance of Plavix and ASA  - Weight loss  - Meds reviewed  - CABG anatomy reviewed. Successful PTCA/DES placement in the proximal body of the SVG to the PDA. Successful PTCA/DES x 1 proximal intermediate branch.   Close follow up. OK for DC  Candee Furbish, MD

## 2016-09-23 NOTE — Progress Notes (Signed)
CARDIAC REHAB PHASE I   PRE:  Rate/Rhythm: 68sr  BP:  Sitting: 119/70     SaO2: 95% ra  MODE:  Ambulation: 500 ft   POST:  Rate/Rhythm: 84  BP:  Sitting: 128/72     SaO2: 96% ra  8:25am-9:10am Patient ambulated independently at a comfortable pace with no complaints. Interested in Cardiac Rehab outpatient at [facility]. Educated on exercising at home, nutrition, new medications, restrictions, and risk factors. Patient stated understanding.  [place], MS 09/23/2016 9:04 AM

## 2016-09-25 ENCOUNTER — Telehealth (HOSPITAL_COMMUNITY): Payer: Self-pay

## 2016-09-25 NOTE — Telephone Encounter (Signed)
Patient insurance is active and insurance verified. Patient has Medicare A/B - no co-payment, deductible $183.00/$183.00 has been met, 20% co-insurance, no out of pocket, no pre-authorization and no limit on visit. Passport/reference [phone].

## 2016-10-11 ENCOUNTER — Ambulatory Visit: Payer: Medicare Other | Admitting: Skilled Nursing Facility1

## 2016-10-13 ENCOUNTER — Ambulatory Visit (INDEPENDENT_AMBULATORY_CARE_PROVIDER_SITE_OTHER): Payer: Medicare Other | Admitting: Cardiovascular Disease

## 2016-10-13 ENCOUNTER — Encounter: Payer: Self-pay | Admitting: Cardiovascular Disease

## 2016-10-13 VITALS — BP 96/60 | HR 63 | Ht 64.0 in | Wt 214.0 lb

## 2016-10-13 DIAGNOSIS — I209 Angina pectoris, unspecified: Secondary | ICD-10-CM | POA: Diagnosis not present

## 2016-10-13 DIAGNOSIS — E785 Hyperlipidemia, unspecified: Secondary | ICD-10-CM | POA: Diagnosis not present

## 2016-10-13 DIAGNOSIS — I10 Essential (primary) hypertension: Secondary | ICD-10-CM | POA: Diagnosis not present

## 2016-10-13 DIAGNOSIS — I200 Unstable angina: Secondary | ICD-10-CM | POA: Diagnosis not present

## 2016-10-13 DIAGNOSIS — I5032 Chronic diastolic (congestive) heart failure: Secondary | ICD-10-CM

## 2016-10-13 DIAGNOSIS — I25709 Atherosclerosis of coronary artery bypass graft(s), unspecified, with unspecified angina pectoris: Secondary | ICD-10-CM

## 2016-10-13 DIAGNOSIS — Z9889 Other specified postprocedural states: Secondary | ICD-10-CM | POA: Diagnosis not present

## 2016-10-13 MED ORDER — FUROSEMIDE 20 MG PO TABS: ORAL_TABLET | ORAL | 4 refills | Status: DC

## 2016-10-13 NOTE — Patient Instructions (Signed)
Medication Instructions:  Your physician recommends that you continue on your current medications as directed. Please refer to the Current Medication list given to you today. May take lasix 20 mg by mouth daily as needed for swelling  Labwork: none  Testing/Procedures: none  Follow-Up: Your physician recommends that you schedule a follow-up appointment in: 6 months. Please call our office in about 3 months to schedule this appointment    Any Other Special Instructions Will Be Listed Below (If Applicable).     If you need a refill on your cardiac medications before your next appointment, please call your pharmacy.

## 2016-10-13 NOTE — Progress Notes (Signed)
Chief Complaint  Patient presents with  . Follow-up    CAD     History of Present Illness: 46 yo female with history of CAD s/p 4VCABG 2011, DM, HTN, HLD, MVP who is here today for cardiac follow up. Cardiac cath 04/01/09 performed by Dr. 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 She also had severe mitral regurgitation. She underwent a a 4V CABG  (LIMA to LAD, SVG to subbranch of ramus intermediate, SVG to lateral subbranch of ramus intermediate, SVG to distal RCA) and mitral valve repair January 2011. Last echo September 2016 with normal LV systolic function, stable mitral valve repair. She had chest pain in June 2018 and her nuclear stress test suggest ischemia. She underwent cardiac cath on 09/22/16 which showed progression of her CAD. LV function was normal. I placed a drug eluting stent in the vein graft to the PDA, a drug eluting stent in the native Ramus intermediate branch and a drug eluting stent in the protected left main.   She is here today for follow up. The patient denies any chest pain, dyspnea, palpitations, lower extremity edema, orthopnea, PND, dizziness, near syncope or syncope.   Primary Care Physician: Lucianne Lei, MD  Past Medical History:  Diagnosis Date  . CHF (congestive heart failure) ([place])   . Coronary artery disease   . Diabetes mellitus   . H/O emotional problems   . Headache, migraine   . High cholesterol   . History of blood transfusion   . Hypertension   . MRSA (methicillin resistant Staphylococcus aureus)   . MVP (mitral valve prolapse)   . Neuromuscular disorder ([place])   . Neuropathy   . Sebaceous cyst     Past Surgical History:  Procedure Laterality Date  . ABDOMINAL HYSTERECTOMY    . CARDIAC CATHETERIZATION  09/22/2016  . CORONARY ARTERY BYPASS GRAFT  with valve repair  . CORONARY STENT INTERVENTION N/A 09/22/2016   Procedure: Coronary  Stent Intervention;  Surgeon: Burnell Blanks, MD;  Location: [place] [facility];  Service: Cardiovascular;  Laterality: N/A;  . CORONARY STENT PLACEMENT  09/22/2016  . LEFT HEART CATH AND CORS/GRAFTS ANGIOGRAPHY N/A 09/22/2016   Procedure: Left Heart Cath and Cors/Grafts Angiography;  Surgeon: Burnell Blanks, MD;  Location: [place] [facility];  Service: Cardiovascular;  Laterality: N/A;  . SKIN GRAFT      Current Outpatient Prescriptions  Medication Sig Dispense Refill  . amLODipine (NORVASC) 5 MG tablet Take 5 mg by mouth 2 (two) times daily.    Marland Kitchen aspirin 81 MG tablet Take 81 mg by mouth daily.     . carvedilol (COREG) 12.5 MG tablet Take 12.5 mg by mouth 2 (two) times daily.    . clopidogrel (PLAVIX) 75 MG tablet Take 1 tablet (75 mg total) by mouth daily with breakfast. 90 tablet 4  . fluticasone (FLONASE) 50 MCG/ACT nasal spray Place 1 spray into both nostrils 2 (two) times daily.     Marland Kitchen gabapentin (NEURONTIN) 300 MG capsule Take 300 mg by mouth 2 (two) times daily.    . insulin aspart (NOVOLOG FLEXPEN) 100 UNIT/ML injection Inject 0-12 Units into the skin See admin instructions. Per sliding scale Under 150 no units 150-199 take 8 units 200-249 take 10 units 250-299 take 12 units    . lisinopril (PRINIVIL,ZESTRIL) 20 MG tablet Take 20 mg by mouth daily.    . montelukast (SINGULAIR) 10 MG  tablet Take 10 mg by mouth at bedtime.    . Naloxone HCl (EVZIO) 0.4 MG/0.4ML SOAJ Inject 0.4 mg as directed as needed (for possible overdose).     Marland Kitchen oxyCODONE-acetaminophen (PERCOCET) 10-325 MG per tablet Take 1 tablet by mouth 3 (three) times daily.    . rosuvastatin (CRESTOR) 10 MG tablet Take 10 mg by mouth every evening.     . SitaGLIPtin-MetFORMIN HCl (JANUMET XR) 50-1000 MG TB24 Take 1 tablet by mouth 2 (two) times daily.    . TRESIBA FLEXTOUCH 100 UNIT/ML SOPN FlexTouch Pen Inject 40 Units as directed at bedtime.   4  . furosemide (LASIX) 20 MG tablet Take one tablet by mouth  daily as needed for swelling 20 tablet 4   No current facility-administered medications for this visit.     Allergies  Allergen Reactions  . Metformin Diarrhea and Nausea And Vomiting    Immediate release tablets (not metformin ER, pt currently taking)    Social History   Social History  . Marital status: Divorced    Spouse name: N/A  . Number of children: 2  . Years of education: N/A   Occupational History  . Disability    Social History Main Topics  . Smoking status: Former Smoker    Packs/day: 1.00    Years: 28.00    Types: Cigarettes    Quit date: 03/31/2009  . Smokeless tobacco: Never Used  . Alcohol use No  . Drug use: No  . Sexual activity: Yes    Birth control/ protection: Surgical     Comment: partial hysterectomy   Other Topics Concern  . Not on file   Social History Narrative  . No narrative on file    Family History  Problem Relation Age of Onset  . Heart disease Father   . Asthma Father   . Hypertension Father   . Diabetes Father   . Arthritis Father   . Heart disease Mother   . Hypertension Mother   . Diabetes Mother   . Migraines Mother   . Stroke Mother   . Emphysema Maternal Grandmother   . Hypertension Brother   . Diabetes Brother     Review of Systems:  As stated in the HPI and otherwise negative.   BP 96/60   Pulse 63   Ht 5\' 4"  (1.626 m)   Wt 214 lb (97.1 kg)   SpO2 98%   BMI 36.73 kg/m   Physical Examination:  General: Well developed, well nourished, NAD  HEENT: OP clear, mucus membranes moist  SKIN: warm, dry. No rashes. Neuro: No focal deficits  Musculoskeletal: Muscle strength 5/5 all ext  Psychiatric: Mood and affect normal  Neck: No JVD, no carotid bruits, no thyromegaly, no lymphadenopathy.  Lungs:Clear bilaterally, no wheezes, rhonci, crackles Cardiovascular: Regular rate and rhythm. No murmurs, gallops or rubs. Abdomen:Soft. Bowel sounds present. Non-tender.  Extremities: No lower extremity edema. Pulses  are 2 + in the bilateral DP/PT.  Echo September 2016: Left ventricle: The cavity size was normal. Wall thickness was   normal. Systolic function was normal. The estimated ejection   fraction was in the range of 55% to 60%. Wall motion was normal;   there were no regional wall motion abnormalities. Doppler   parameters are consistent with abnormal left ventricular   relaxation (grade 1 diastolic dysfunction). - Aortic valve: There was no stenosis. - Mitral valve: Status post mitral valve repair. Probably mild   mitral stenosis. There was no significant regurgitation. Mean  gradient (D): 6 mm Hg. Valve area by pressure half-time: 1.3   cm^2. - Right ventricle: The cavity size was normal. Systolic function   was mildly reduced. - Pulmonary arteries: No complete TR doppler jet so unable to   estimate PA systolic pressure. - Inferior vena cava: The vessel was normal in size. The   respirophasic diameter changes were in the normal range (>= 50%),   consistent with normal central venous pressure.  Impressions:  - Technically difficult study with poor acoustic windows. Normal LV   size with EF 55-60%. Normal RV size with mildly decreased   systolic function. Status post mitral valve repair with no   significant regurgitation. There is probably mild mitral   stenosis.  EKG:  EKG is ordered today. The ekg ordered today demonstrates  Recent Labs: 09/23/2016: BUN 18; Creatinine, Ser 0.75; Hemoglobin 12.9; Platelets 278; Potassium 3.8; Sodium 132   Lipid Panel  Followed in primary care  Wt Readings from Last 3 Encounters:  10/13/16 214 lb (97.1 kg)  09/23/16 225 lb 8.5 oz (102.3 kg)  09/19/16 216 lb (98 kg)     Other studies Reviewed: Additional studies/ records that were reviewed today include: . Review of the above records demonstrates:    Assessment and Plan:   1. CAD without angina: Chest pain resolved following recent multi-vessel PCI with drug eluting stents placed in  the SVG to PDA, native Ramus branch and left main artery. Will continue ASA, Plavix, statin and beta blocker.      2. HYPERLIPIDEMIA: Lipids followed in primary care. Continue statin. LDL 60  In September 2017.  3. HYPERTENSION: BP controlled.   4. TOBACCO ABUSE, in remission: She stopped smoking in 2010.   5. Mitral regurgitation: Mitral valve repair stable by echo in 2016.    6. Chronic diastolic CHF: She has mild LE edema. Filling pressures elevated at cath. Will give her Lasix 20 mg to use prn.   Current medicines are reviewed at length with the patient today.  The patient does not have concerns regarding medicines.  The following changes have been made:  no change  Labs/ tests ordered today include:   No orders of the defined types were placed in this encounter.   Disposition:   FU with me in 6  months  Signed, Lauree Chandler, MD 10/13/2016 10:12 AM    [address], [place], [place]  [address] Phone: [phone]; Fax: [phone]

## 2016-10-19 DIAGNOSIS — M79662 Pain in left lower leg: Secondary | ICD-10-CM | POA: Diagnosis not present

## 2016-10-19 DIAGNOSIS — M79605 Pain in left leg: Secondary | ICD-10-CM | POA: Diagnosis not present

## 2016-10-19 DIAGNOSIS — M79661 Pain in right lower leg: Secondary | ICD-10-CM | POA: Diagnosis not present

## 2016-10-19 DIAGNOSIS — Z79891 Long term (current) use of opiate analgesic: Secondary | ICD-10-CM | POA: Diagnosis not present

## 2016-10-19 DIAGNOSIS — M545 Low back pain: Secondary | ICD-10-CM | POA: Diagnosis not present

## 2016-10-19 DIAGNOSIS — M79604 Pain in right leg: Secondary | ICD-10-CM | POA: Diagnosis not present

## 2016-10-19 DIAGNOSIS — G894 Chronic pain syndrome: Secondary | ICD-10-CM | POA: Diagnosis not present

## 2016-10-20 DIAGNOSIS — I110 Hypertensive heart disease with heart failure: Secondary | ICD-10-CM | POA: Diagnosis not present

## 2016-10-20 DIAGNOSIS — E0842 Diabetes mellitus due to underlying condition with diabetic polyneuropathy: Secondary | ICD-10-CM | POA: Diagnosis not present

## 2016-10-23 ENCOUNTER — Telehealth (HOSPITAL_COMMUNITY): Payer: Self-pay | Admitting: Pharmacist

## 2016-10-23 NOTE — Telephone Encounter (Signed)
Cardiac Rehab Medication Review by a Pharmacist  Does the patient  feel that his/her medications are working for him/her?  Yes, except her pain is uncontrolled (7-8) on the oxycodone-APAP. It has been uncontrolled for 2-3 months. Hasn't had a chance to see the doctor to obtain a change in her pain regimen, but will talk to her in the next week or so.  Has the patient been experiencing any side effects to the medications prescribed?  no  Does the patient measure his/her own blood pressure or blood glucose at home?  Yes, BP 80-90/50-60 for the past week - PCP said that she is okay at this range as long as she does not feel symptoms. I advised the patient to continue monitoring her BP and talk to her doctor if she starts feeling dizziness or lightheaded. Patient holds lisinopril dose when BP is low.  Does the patient have any problems obtaining medications due to transportation or finances?   no  Understanding of regimen: good Understanding of indications: good Potential of compliance: good  Pharmacist comments: See above. Patient had a list of medications and was able to explain to me how she handles her diabetes regimen. Patient checks BG four times daily and it runs between 150-180s on a daily basis. She is on Janumet, [employer], [place] (as of Friday 10/20/2016), and Saige [place] Aas, and has no concerns, other than why she is on four medications. She stated she will talk to the doctor about this.   No other questions or concerns   Nida Boatman, PharmD PGY1 Acute Care Pharmacy Resident Pager: [phone] 10/23/2016 2:59 PM

## 2016-10-26 ENCOUNTER — Encounter (HOSPITAL_COMMUNITY): Payer: Self-pay

## 2016-10-26 ENCOUNTER — Encounter (HOSPITAL_COMMUNITY)
Admission: RE | Admit: 2016-10-26 | Discharge: 2016-10-26 | Disposition: A | Discharge status: Home / Self Care | Payer: Medicare Other | Source: Ambulatory Visit | Attending: Cardiovascular Disease | Admitting: Cardiovascular Disease

## 2016-10-26 VITALS — BP 110/72 | HR 64 | Ht 64.5 in | Wt 213.8 lb

## 2016-10-26 DIAGNOSIS — Z48812 Encounter for surgical aftercare following surgery on the circulatory system: Secondary | ICD-10-CM | POA: Insufficient documentation

## 2016-10-26 DIAGNOSIS — Z955 Presence of coronary angioplasty implant and graft: Secondary | ICD-10-CM | POA: Insufficient documentation

## 2016-10-26 NOTE — Progress Notes (Signed)
Cardiac Individual Treatment Plan  Patient Details  Name: Elizabeth Park MRN: [id number] Date of Birth: [birth year] Referring Provider:     CARDIAC REHAB PHASE II ORIENTATION from 10/26/2016 in [place]  Referring Provider  Lauree Chandler MD      Initial Encounter Date:    CARDIAC REHAB PHASE II ORIENTATION from 10/26/2016 in [place]  Date  10/26/16  Referring Provider  Lauree Chandler MD      Visit Diagnosis: Status post coronary artery stent placement  Patient's Home Medications on Admission:  Current Outpatient Prescriptions:  .  amLODipine (NORVASC) 5 MG tablet, Take 5 mg by mouth 2 (two) times daily., Disp: , Rfl:  .  aspirin 81 MG tablet, Take 81 mg by mouth daily. , Disp: , Rfl:  .  carvedilol (COREG) 12.5 MG tablet, Take 12.5 mg by mouth 2 (two) times daily., Disp: , Rfl:  .  clopidogrel (PLAVIX) 75 MG tablet, Take 1 tablet (75 mg total) by mouth daily with breakfast., Disp: 90 tablet, Rfl: 4 .  empagliflozin (JARDIANCE) 10 MG TABS tablet, Take 10 mg by mouth daily., Disp: , Rfl:  .  fluticasone (FLONASE) 50 MCG/ACT nasal spray, Place 1 spray into both nostrils 2 (two) times daily. , Disp: , Rfl:  .  furosemide (LASIX) 20 MG tablet, Take one tablet by mouth daily as needed for swelling, Disp: 20 tablet, Rfl: 4 .  gabapentin (NEURONTIN) 300 MG capsule, Take 300 mg by mouth 2 (two) times daily., Disp: , Rfl:  .  insulin aspart (NOVOLOG FLEXPEN) 100 UNIT/ML injection, Inject 0-12 Units into the skin See admin instructions. Per sliding scale Under 150 no units 150-199 take 8 units 200-249 take 10 units 250-299 take 12 units, Disp: , Rfl:  .  lisinopril (PRINIVIL,ZESTRIL) 20 MG tablet, Take 20 mg by mouth daily., Disp: , Rfl:  .  montelukast (SINGULAIR) 10 MG tablet, Take 10 mg by mouth at bedtime., Disp: , Rfl:  .  Naloxone HCl (EVZIO) 0.4 MG/0.4ML SOAJ, Inject 0.4 mg as directed as needed (for possible  overdose). , Disp: , Rfl:  .  oxyCODONE-acetaminophen (PERCOCET) 10-325 MG per tablet, Take 1 tablet by mouth 3 (three) times daily., Disp: , Rfl:  .  rosuvastatin (CRESTOR) 10 MG tablet, Take 10 mg by mouth every evening. , Disp: , Rfl:  .  SitaGLIPtin-MetFORMIN HCl (JANUMET XR) 50-1000 MG TB24, Take 1 tablet by mouth 2 (two) times daily., Disp: , Rfl:  .  TRESIBA FLEXTOUCH 100 UNIT/ML SOPN FlexTouch Pen, Inject 40 Units as directed at bedtime. , Disp: , Rfl: 4  Past Medical History: Past Medical History:  Diagnosis Date  . CHF (congestive heart failure) ([place])   . Coronary artery disease   . Diabetes mellitus   . H/O emotional problems   . Headache, migraine   . High cholesterol   . History of blood transfusion   . Hypertension   . MRSA (methicillin resistant Staphylococcus aureus)   . MVP (mitral valve prolapse)   . Neuromuscular disorder ([place])   . Neuropathy   . Sebaceous cyst     Tobacco Use: History  Smoking Status  . Former Smoker  . Packs/day: 1.00  . Years: 28.00  . Types: Cigarettes  . Quit date: 03/31/2009  Smokeless Tobacco  . Never Used    Labs: Recent Review Flowsheet Data    Labs for ITP Cardiac and Pulmonary Rehab Latest Ref Rng & Units 04/10/2009 04/10/2009 04/26/2009  05/28/2009 09/02/2009   Cholestrol 0 - 200 mg/dL - - - - -   LDLCALC 0 - 99 mg/dL - - - - -   HDL >39 mg/dL - - - - -   Trlycerides <150 mg/dL - - - - -   Hemoglobin A1c % - - 8.0(H) - 6.5   PHART 7.350 - 7.400 - - - 7.454(H) -   PCO2ART 35.0 - 45.0 mmHg - - - 33.9(L) -   HCO3 20.0 - 24.0 mEq/L - - - 23.4 -   TCO2 0 - 100 mmol/L 42 39 - 24.5 -   ACIDBASEDEF 0.0 - 2.0 mmol/L - - - 0.0 -   O2SAT % - - - 97.9 -      Capillary Blood Glucose: Lab Results  Component Value Date   GLUCAP 186 (H) 09/23/2016   GLUCAP 165 (H) 09/22/2016   GLUCAP 170 (H) 09/22/2016   GLUCAP 228 (H) 09/22/2016   GLUCAP 162 (H) 09/22/2016     Exercise Target Goals: Date: 10/26/16  Exercise Program  Goal: Individual exercise prescription set with THRR, safety & activity barriers. Participant demonstrates ability to understand and report RPE using BORG scale, to self-measure pulse accurately, and to acknowledge the importance of the exercise prescription.  Exercise Prescription Goal: Starting with aerobic activity 30 plus minutes a day, 3 days per week for initial exercise prescription. Provide home exercise prescription and guidelines that participant acknowledges understanding prior to discharge.  Activity Barriers & Risk Stratification:     Activity Barriers & Cardiac Risk Stratification - 10/26/16 1408      Activity Barriers & Cardiac Risk Stratification   Activity Barriers Back Problems   Cardiac Risk Stratification High      6 Minute Walk:     6 Minute Walk    Row Name 10/26/16 1501         6 Minute Walk   Phase Initial     Distance 1600 feet     Walk Time 6 minutes     # of Rest Breaks 0     MPH 3     METS 3.9     RPE 11     VO2 Peak 13.7     Symptoms No     Resting HR 64 bpm     Resting BP 110/72     Max Ex. HR 83 bpm     Max Ex. BP 117/69     2 Minute Post BP 117/77        Oxygen Initial Assessment:   Oxygen Re-Evaluation:   Oxygen Discharge (Final Oxygen Re-Evaluation):   Initial Exercise Prescription:     Initial Exercise Prescription - 10/26/16 1500      Date of Initial Exercise RX and Referring Provider   Date 10/26/16   Referring Provider Lauree Chandler MD     Treadmill   MPH 2.5   Grade 0   Minutes 10   METs 2.5     Bike   Level 1   Minutes 10   METs 3.1     NuStep   Level 3   SPM 80   Minutes 10   METs 2.9     Prescription Details   Frequency (times per week) 3   Duration Progress to 45 minutes of aerobic exercise without signs/symptoms of physical distress     Intensity   THRR 40-80% of Max Heartrate 69-138   Ratings of Perceived Exertion 11-13   Perceived Dyspnea 0-4  Progression   Progression  Continue to progress workloads to maintain intensity without signs/symptoms of physical distress.     Resistance Training   Training Prescription Yes   Weight 2   Reps 10-15      Perform Capillary Blood Glucose checks as needed.  Exercise Prescription Changes:   Exercise Comments:   Exercise Goals and Review:     Exercise Goals    Row Name 10/26/16 1416             Exercise Goals   Increase Physical Activity (P)  Yes       Intervention (P)  Provide advice, education, support and counseling about physical activity/exercise needs.;Develop an individualized exercise prescription for aerobic and resistive training based on initial evaluation findings, risk stratification, comorbidities and participant's personal goals.       Expected Outcomes (P)  Achievement of increased cardiorespiratory fitness and enhanced flexibility, muscular endurance and strength shown through measurements of functional capacity and personal statement of participant.       Increase Strength and Stamina (P)  Yes       Intervention (P)  Provide advice, education, support and counseling about physical activity/exercise needs.;Develop an individualized exercise prescription for aerobic and resistive training based on initial evaluation findings, risk stratification, comorbidities and participant's personal goals.       Expected Outcomes (P)  Achievement of increased cardiorespiratory fitness and enhanced flexibility, muscular endurance and strength shown through measurements of functional capacity and personal statement of participant.          Exercise Goals Re-Evaluation :    Discharge Exercise Prescription (Final Exercise Prescription Changes):   Nutrition:  Target Goals: Understanding of nutrition guidelines, daily intake of sodium 1500mg , cholesterol 200mg , calories 30% from fat and 7% or less from saturated fats, daily to have 5 or more servings of fruits and vegetables.  Biometrics:     Pre  Biometrics - 10/26/16 1514      Pre Biometrics   Waist Circumference 44.5 inches   Hip Circumference 47.5 inches   Waist to Hip Ratio 0.94 %   Triceps Skinfold 15 mm   % Body Fat 41.9 %   Grip Strength 40 kg   Flexibility 8.5 in   Single Leg Stand 28.8 seconds       Nutrition Therapy Plan and Nutrition Goals:   Nutrition Discharge: Nutrition Scores:   Nutrition Goals Re-Evaluation:   Nutrition Goals Re-Evaluation:   Nutrition Goals Discharge (Final Nutrition Goals Re-Evaluation):   Psychosocial: Target Goals: Acknowledge presence or absence of significant depression and/or stress, maximize coping skills, provide positive support system. Participant is able to verbalize types and ability to use techniques and skills needed for reducing stress and depression.  Initial Review & Psychosocial Screening:     Initial Psych Review & Screening - 10/26/16 1538      Initial Review   Current issues with Current Stress Concerns   Source of Stress Concerns Family   Comments Patient rated "low" - family son who is in the [employer] is leaving soon for [place]     Family Dynamics   Good Support System? Yes  pt reports significant other and two sons.     Barriers   Psychosocial barriers to participate in program The patient should benefit from training in stress management and relaxation.      Quality of Life Scores:     Quality of Life - 10/26/16 1441      Quality of Life Scores   Health/Function Pre  21.6 %   Socioeconomic Pre 28 %   Psych/Spiritual Pre 23 %   Family Pre 28.8 %   GLOBAL Pre 24.15 %      PHQ-9: Recent Review Flowsheet Data    Depression screen [facility] 2/9 08/09/2016 09/13/2015   Decreased Interest 0 0   Down, Depressed, Hopeless 0 0   PHQ - 2 Score 0 0     Interpretation of Total Score  Total Score Depression Severity:  1-4 = Minimal depression, 5-9 = Mild depression, 10-14 = Moderate depression, 15-19 = Moderately severe depression, 20-27 = Severe  depression   Psychosocial Evaluation and Intervention:   Psychosocial Re-Evaluation:   Psychosocial Discharge (Final Psychosocial Re-Evaluation):   Vocational Rehabilitation: Provide vocational rehab assistance to qualifying candidates.   Vocational Rehab Evaluation & Intervention:     Vocational Rehab - 10/26/16 1542      Initial Vocational Rehab Evaluation & Intervention   Assessment shows need for Vocational Rehabilitation Yes      Education: Education Goals: Education classes will be provided on a weekly basis, covering required topics. Participant will state understanding/return demonstration of topics presented.  Learning Barriers/Preferences:     Learning Barriers/Preferences - 10/26/16 1407      Learning Barriers/Preferences   Learning Barriers None   Learning Preferences Skilled Demonstration      Education Topics: Count Your Pulse:  -Group instruction provided by verbal instruction, demonstration, patient participation and written materials to support subject.  Instructors address importance of being able to find your pulse and how to count your pulse when at home without a heart monitor.  Patients get hands on experience counting their pulse with staff help and individually.   Heart Attack, Angina, and Risk Factor Modification:  -Group instruction provided by verbal instruction, video, and written materials to support subject.  Instructors address signs and symptoms of angina and heart attacks.    Also discuss risk factors for heart disease and how to make changes to improve heart health risk factors.   Functional Fitness:  -Group instruction provided by verbal instruction, demonstration, patient participation, and written materials to support subject.  Instructors address safety measures for doing things around the house.  Discuss how to get up and down off the floor, how to pick things up properly, how to safely get out of a chair without assistance, and  balance training.   Meditation and Mindfulness:  -Group instruction provided by verbal instruction, patient participation, and written materials to support subject.  Instructor addresses importance of mindfulness and meditation practice to help reduce stress and improve awareness.  Instructor also leads participants through a meditation exercise.    Stretching for Flexibility and Mobility:  -Group instruction provided by verbal instruction, patient participation, and written materials to support subject.  Instructors lead participants through series of stretches that are designed to increase flexibility thus improving mobility.  These stretches are additional exercise for major muscle groups that are typically performed during regular warm up and cool down.   Hands Only CPR:  -Group verbal, video, and participation provides a basic overview of AHA guidelines for community CPR. Role-play of emergencies allow participants the opportunity to practice calling for help and chest compression technique with discussion of AED use.   Hypertension: -Group verbal and written instruction that provides a basic overview of hypertension including the most recent diagnostic guidelines, risk factor reduction with self-care instructions and medication management.    Nutrition I class: Heart Healthy Eating:  -Group instruction provided by PowerPoint slides, verbal  discussion, and written materials to support subject matter. The instructor gives an explanation and review of the Therapeutic Lifestyle Changes diet recommendations, which includes a discussion on lipid goals, dietary fat, sodium, fiber, plant stanol/sterol esters, sugar, and the components of a well-balanced, healthy diet.   Nutrition II class: Lifestyle Skills:  -Group instruction provided by PowerPoint slides, verbal discussion, and written materials to support subject matter. The instructor gives an explanation and review of label reading,  grocery shopping for heart health, heart healthy recipe modifications, and ways to make healthier choices when eating out.   Diabetes Question & Answer:  -Group instruction provided by PowerPoint slides, verbal discussion, and written materials to support subject matter. The instructor gives an explanation and review of diabetes co-morbidities, pre- and post-prandial blood glucose goals, pre-exercise blood glucose goals, signs, symptoms, and treatment of hypoglycemia and hyperglycemia, and foot care basics.   Diabetes Blitz:  -Group instruction provided by PowerPoint slides, verbal discussion, and written materials to support subject matter. The instructor gives an explanation and review of the physiology behind type 1 and type 2 diabetes, diabetes medications and rational behind using different medications, pre- and post-prandial blood glucose recommendations and Hemoglobin A1c goals, diabetes diet, and exercise including blood glucose guidelines for exercising safely.    Portion Distortion:  -Group instruction provided by PowerPoint slides, verbal discussion, written materials, and food models to support subject matter. The instructor gives an explanation of serving size versus portion size, changes in portions sizes over the last 20 years, and what consists of a serving from each food group.   Stress Management:  -Group instruction provided by verbal instruction, video, and written materials to support subject matter.  Instructors review role of stress in heart disease and how to cope with stress positively.     Exercising on Your Own:  -Group instruction provided by verbal instruction, power point, and written materials to support subject.  Instructors discuss benefits of exercise, components of exercise, frequency and intensity of exercise, and end points for exercise.  Also discuss use of nitroglycerin and activating EMS.  Review options of places to exercise outside of rehab.  Review  guidelines for sex with heart disease.   Cardiac Drugs I:  -Group instruction provided by verbal instruction and written materials to support subject.  Instructor reviews cardiac drug classes: antiplatelets, anticoagulants, beta blockers, and statins.  Instructor discusses reasons, side effects, and lifestyle considerations for each drug class.   Cardiac Drugs II:  -Group instruction provided by verbal instruction and written materials to support subject.  Instructor reviews cardiac drug classes: angiotensin converting enzyme inhibitors (ACE-I), angiotensin II receptor blockers (ARBs), nitrates, and calcium channel blockers.  Instructor discusses reasons, side effects, and lifestyle considerations for each drug class.   Anatomy and Physiology of the Circulatory System:  Group verbal and written instruction and models provide basic cardiac anatomy and physiology, with the coronary electrical and arterial systems. Review of: AMI, Angina, Valve disease, Heart Failure, Peripheral Artery Disease, Cardiac Arrhythmia, Pacemakers, and the ICD.   Other Education:  -Group or individual verbal, written, or video instructions that support the educational goals of the cardiac rehab program.   Knowledge Questionnaire Score:     Knowledge Questionnaire Score - 10/26/16 1428      Knowledge Questionnaire Score   Pre Score 21/24      Core Components/Risk Factors/Patient Goals at Admission:     Personal Goals and Risk Factors at Admission - 10/26/16 1536      Core Components/Risk  Factors/Patient Goals on Admission   Diabetes Yes   Lipids Yes   Personal Goal Other Yes   Personal Goal Increase aerobic capacity and improve breathing      Core Components/Risk Factors/Patient Goals Review:    Core Components/Risk Factors/Patient Goals at Discharge (Final Review):    ITP Comments:     ITP Comments    Row Name 10/26/16 1412           ITP Comments Dr. Fransico Him, Medical Director            Comments:  Patient attended orientation from 1330 to 1500  to review rules and guidelines for program. Completed 6 minute walk test, Intitial ITP, and exercise prescription.  VSS difficult to hear manual bp but able to get reading with auto cuff. Telemetry-SR. Pt is asymptomtic. Brief psychosocial assessment reveal low rating of stress related to her son who is the [employer].  Pt son was able to surprise her with a visit before heading to [place].  Pt feels some stress because he will be gone for 2 years.  Pt is looking forward to participating in the rehab program. Maurice Small RN, BSN Cardiac and Pulmonary Rehab Nurse Navigator

## 2016-10-27 ENCOUNTER — Other Ambulatory Visit: Payer: Self-pay | Admitting: *Deleted

## 2016-10-27 MED ORDER — NITROGLYCERIN 0.4 MG SL SUBL: 0.4000 mg | SUBLINGUAL_TABLET | SUBLINGUAL | 6 refills | Status: DC | PRN

## 2016-11-01 ENCOUNTER — Encounter (HOSPITAL_COMMUNITY): Payer: Self-pay

## 2016-11-01 ENCOUNTER — Encounter (HOSPITAL_COMMUNITY)
Admission: RE | Admit: 2016-11-01 | Discharge: 2016-11-01 | Disposition: A | Discharge status: Home / Self Care | Payer: Medicare Other | Source: Ambulatory Visit | Attending: Cardiovascular Disease | Admitting: Cardiovascular Disease

## 2016-11-01 DIAGNOSIS — Z48812 Encounter for surgical aftercare following surgery on the circulatory system: Secondary | ICD-10-CM | POA: Insufficient documentation

## 2016-11-01 DIAGNOSIS — Z955 Presence of coronary angioplasty implant and graft: Secondary | ICD-10-CM | POA: Insufficient documentation

## 2016-11-01 LAB — GLUCOSE, CAPILLARY
Glucose-Capillary: 132 mg/dL — ABNORMAL HIGH (ref 65–99)
Glucose-Capillary: 144 mg/dL — ABNORMAL HIGH (ref 65–99)

## 2016-11-01 NOTE — Progress Notes (Signed)
Daily Session Note  Patient Details  Name: Elizabeth Park MRN: [id number] Date of Birth: November 24, 1970 Referring Provider:     CARDIAC REHAB PHASE II ORIENTATION from 10/26/2016 in [place]  Referring Provider  Lauree Chandler MD      Encounter Date: 11/01/2016  Check In:     Session Check In - 11/01/16 1433      Check-In   Location [facility]   Staff Present Cleda Mccreedy, MS, Exercise Physiologist;Amber Fair, MS, ACSM RCEP, Exercise Physiologist;Calder Oblinger, RN, Deland Pretty, MS, ACSM CEP, Exercise Physiologist;Maria Venetia Maxon, RN, BSN   Supervising physician immediately available to respond to emergencies [facility] immediately available   Physician(s) Dr. Jonnie Finner   Medication changes reported     No   Fall or balance concerns reported    No   Tobacco Cessation No Change   Warm-up and Cool-down Performed as group-led instruction   Resistance Training Performed No   VAD Patient? No     Pain Assessment   Currently in Pain? No/denies   Multiple Pain Sites No      Capillary Blood Glucose: Results for orders placed or performed during the hospital encounter of 11/01/16 (from the past 24 hour(s))  Glucose, capillary     Status: Abnormal   Collection Time: 11/01/16  1:34 PM  Result Value Ref Range   Glucose-Capillary 144 (H) 65 - 99 mg/dL  Glucose, capillary     Status: Abnormal   Collection Time: 11/01/16  2:20 PM  Result Value Ref Range   Glucose-Capillary 132 (H) 65 - 99 mg/dL      History  Smoking Status  . Former Smoker  . Packs/day: 1.00  . Years: 28.00  . Types: Cigarettes  . Quit date: 03/31/2009  Smokeless Tobacco  . Never Used    Goals Met:  Exercise tolerated well  Goals Unmet:  Not Applicable  Comments: Pt started cardiac rehab today.  Pt tolerated light exercise without difficulty. VSS, telemetry-sinus rhythnm, asymptomatic.  Medication list reconciled. Pt denies barriers to  medicaiton compliance.  PSYCHOSOCIAL ASSESSMENT:  PHQ-0. Pt exhibits positive coping skills, hopeful outlook with supportive family. No psychosocial needs identified at this time, no psychosocial interventions necessary.    Pt enjoys crafting, ceramics and event planning.   Pt goals for cardiac rehab are to decrease dyspnea on exertion and increase aerobic capacity. Pt oriented to exercise equipment and routine.    Understanding verbalized.   Dr. Fransico Him is Medical Director for Cardiac Rehab at [facility].

## 2016-11-01 NOTE — Progress Notes (Signed)
Aniyia MAUDRY ZEIDAN 46 y.o. female DOB [birth year] MRN [id number]       Nutrition Screen & Note  1. Status post coronary artery stent placement    Past Medical History:  Diagnosis Date  . CHF (congestive heart failure) ([place])   . Coronary artery disease   . Diabetes mellitus   . H/O emotional problems   . Headache, migraine   . High cholesterol   . History of blood transfusion   . Hypertension   . MRSA (methicillin resistant Staphylococcus aureus)   . MVP (mitral valve prolapse)   . Neuromuscular disorder ([place])   . Neuropathy   . Sebaceous cyst    Meds reviewed. Jardiance, Novolog SSI, Janumet, Tresiba noted  HT: Ht Readings from Last 1 Encounters:  10/26/16 5' 4.5" (1.638 m)    WT: Wt Readings from Last 3 Encounters:  10/26/16 213 lb 13.5 oz (97 kg)  10/13/16 214 lb (97.1 kg)  09/23/16 225 lb 8.5 oz (102.3 kg)     BMI 36.2   Current tobacco use? No  Labs: reviewed; no new lipids/HbA1c noted.   Nutrition Diagnosis ? Food-and nutrition-related knowledge deficit related to lack of exposure to information as related to diagnosis of: ? CVD ? DM ? Obesity related to excessive energy intake as evidenced by a BMI of 36.2  Nutrition Goal(s):  ? Pt to identify food quantities necessary to achieve weight loss of 6-24 lb (2.7-10.9 kg) at graduation from cardiac rehab. Goal wt of 175 lb desired.   Plan:  Pt to attend nutrition classes ? Nutrition I ? Nutrition II ? Portion Distortion ? Diabetes Blitz ? Diabetes Q & A Will provide client-centered nutrition education as part of interdisciplinary care.   Monitor and evaluate progress toward nutrition goal with team.  Derek Mound, M.Ed, RD, LDN, CDE 11/01/2016 2:49 PM

## 2016-11-02 ENCOUNTER — Ambulatory Visit: Payer: Medicare Other | Admitting: Psychiatry

## 2016-11-03 ENCOUNTER — Encounter (HOSPITAL_COMMUNITY): Payer: Medicare Other

## 2016-11-06 ENCOUNTER — Encounter (HOSPITAL_COMMUNITY): Payer: Medicare Other

## 2016-11-08 ENCOUNTER — Encounter (HOSPITAL_COMMUNITY): Payer: Medicare Other

## 2016-11-10 ENCOUNTER — Encounter (HOSPITAL_COMMUNITY): Payer: Medicare Other

## 2016-11-13 ENCOUNTER — Encounter (HOSPITAL_COMMUNITY)
Admission: RE | Admit: 2016-11-13 | Discharge: 2016-11-13 | Disposition: A | Discharge status: Home / Self Care | Payer: Medicare Other | Source: Ambulatory Visit | Attending: Cardiovascular Disease | Admitting: Cardiovascular Disease

## 2016-11-13 DIAGNOSIS — Z48812 Encounter for surgical aftercare following surgery on the circulatory system: Secondary | ICD-10-CM | POA: Diagnosis not present

## 2016-11-13 DIAGNOSIS — Z955 Presence of coronary angioplasty implant and graft: Secondary | ICD-10-CM

## 2016-11-13 LAB — GLUCOSE, CAPILLARY: Glucose-Capillary: 152 mg/dL — ABNORMAL HIGH (ref 65–99)

## 2016-11-14 DIAGNOSIS — M130 Polyarthritis, unspecified: Secondary | ICD-10-CM | POA: Diagnosis not present

## 2016-11-14 DIAGNOSIS — E089 Diabetes mellitus due to underlying condition without complications: Secondary | ICD-10-CM | POA: Diagnosis not present

## 2016-11-14 DIAGNOSIS — E782 Mixed hyperlipidemia: Secondary | ICD-10-CM | POA: Diagnosis not present

## 2016-11-14 DIAGNOSIS — I110 Hypertensive heart disease with heart failure: Secondary | ICD-10-CM | POA: Diagnosis not present

## 2016-11-14 DIAGNOSIS — E669 Obesity, unspecified: Secondary | ICD-10-CM | POA: Diagnosis not present

## 2016-11-15 ENCOUNTER — Encounter (HOSPITAL_COMMUNITY)
Admission: RE | Admit: 2016-11-15 | Discharge: 2016-11-15 | Disposition: A | Discharge status: Home / Self Care | Payer: Medicare Other | Source: Ambulatory Visit | Attending: Cardiovascular Disease | Admitting: Cardiovascular Disease

## 2016-11-15 DIAGNOSIS — Z955 Presence of coronary angioplasty implant and graft: Secondary | ICD-10-CM | POA: Diagnosis not present

## 2016-11-15 DIAGNOSIS — Z48812 Encounter for surgical aftercare following surgery on the circulatory system: Secondary | ICD-10-CM | POA: Diagnosis not present

## 2016-11-15 LAB — GLUCOSE, CAPILLARY
Glucose-Capillary: 120 mg/dL — ABNORMAL HIGH (ref 65–99)
Glucose-Capillary: 89 mg/dL (ref 65–99)

## 2016-11-16 NOTE — Progress Notes (Signed)
Cardiac Individual Treatment Plan  Patient Details  Name: Elizabeth Park MRN: [id number] Date of Birth: 10-May-1970 Referring Provider:     CARDIAC REHAB PHASE II ORIENTATION from 10/26/2016 in [facility]  Referring Provider  Lauree Chandler MD      Initial Encounter Date:    CARDIAC REHAB PHASE II ORIENTATION from 10/26/2016 in [facility]  Date  10/26/16  Referring Provider  Lauree Chandler MD      Visit Diagnosis: Status post coronary artery stent placement  Patient's Home Medications on Admission:  Current Outpatient Prescriptions:  .  amLODipine (NORVASC) 5 MG tablet, Take 5 mg by mouth 2 (two) times daily., Disp: , Rfl:  .  aspirin 81 MG tablet, Take 81 mg by mouth daily. , Disp: , Rfl:  .  carvedilol (COREG) 12.5 MG tablet, Take 12.5 mg by mouth 2 (two) times daily., Disp: , Rfl:  .  clopidogrel (PLAVIX) 75 MG tablet, Take 1 tablet (75 mg total) by mouth daily with breakfast., Disp: 90 tablet, Rfl: 4 .  empagliflozin (JARDIANCE) 10 MG TABS tablet, Take 10 mg by mouth daily., Disp: , Rfl:  .  fluticasone (FLONASE) 50 MCG/ACT nasal spray, Place 1 spray into both nostrils 2 (two) times daily. , Disp: , Rfl:  .  furosemide (LASIX) 20 MG tablet, Take one tablet by mouth daily as needed for swelling, Disp: 20 tablet, Rfl: 4 .  gabapentin (NEURONTIN) 300 MG capsule, Take 300 mg by mouth 2 (two) times daily., Disp: , Rfl:  .  insulin aspart (NOVOLOG FLEXPEN) 100 UNIT/ML injection, Inject 0-12 Units into the skin See admin instructions. Per sliding scale Under 150 no units 150-199 take 8 units 200-249 take 10 units 250-299 take 12 units, Disp: , Rfl:  .  lisinopril (PRINIVIL,ZESTRIL) 20 MG tablet, Take 20 mg by mouth daily., Disp: , Rfl:  .  montelukast (SINGULAIR) 10 MG tablet, Take 10 mg by mouth at bedtime., Disp: , Rfl:  .  Naloxone HCl (EVZIO) 0.4 MG/0.4ML SOAJ, Inject 0.4 mg as directed as needed (for possible  overdose). , Disp: , Rfl:  .  nitroGLYCERIN (NITROSTAT) 0.4 MG SL tablet, Place 1 tablet (0.4 mg total) under the tongue every 5 (five) minutes as needed for chest pain., Disp: 25 tablet, Rfl: 6 .  oxyCODONE-acetaminophen (PERCOCET) 10-325 MG per tablet, Take 1 tablet by mouth 3 (three) times daily., Disp: , Rfl:  .  rosuvastatin (CRESTOR) 10 MG tablet, Take 10 mg by mouth every evening. , Disp: , Rfl:  .  SitaGLIPtin-MetFORMIN HCl (JANUMET XR) 50-1000 MG TB24, Take 1 tablet by mouth 2 (two) times daily., Disp: , Rfl:  .  TRESIBA FLEXTOUCH 100 UNIT/ML SOPN FlexTouch Pen, Inject 40 Units as directed at bedtime. , Disp: , Rfl: 4  Past Medical History: Past Medical History:  Diagnosis Date  . CHF (congestive heart failure) ([place])   . Coronary artery disease   . Diabetes mellitus   . H/O emotional problems   . Headache, migraine   . High cholesterol   . History of blood transfusion   . Hypertension   . MRSA (methicillin resistant Staphylococcus aureus)   . MVP (mitral valve prolapse)   . Neuromuscular disorder ([place])   . Neuropathy   . Sebaceous cyst     Tobacco Use: History  Smoking Status  . Former Smoker  . Packs/day: 1.00  . Years: 28.00  . Types: Cigarettes  . Quit date: 03/31/2009  Smokeless  Tobacco  . Never Used    Labs: Recent Review Flowsheet Data    Labs for ITP Cardiac and Pulmonary Rehab Latest Ref Rng & Units 04/10/2009 04/10/2009 04/26/2009 05/28/2009 09/02/2009   Cholestrol 0 - 200 mg/dL - - - - -   LDLCALC 0 - 99 mg/dL - - - - -   HDL >39 mg/dL - - - - -   Trlycerides <150 mg/dL - - - - -   Hemoglobin A1c % - - 8.0(H) - 6.5   PHART 7.350 - 7.400 - - - 7.454(H) -   PCO2ART 35.0 - 45.0 mmHg - - - 33.9(L) -   HCO3 20.0 - 24.0 mEq/L - - - 23.4 -   TCO2 0 - 100 mmol/L 42 39 - 24.5 -   ACIDBASEDEF 0.0 - 2.0 mmol/L - - - 0.0 -   O2SAT % - - - 97.9 -      Capillary Blood Glucose: Lab Results  Component Value Date   GLUCAP 120 (H) 11/15/2016   GLUCAP 89 11/15/2016    GLUCAP 152 (H) 11/13/2016   GLUCAP 132 (H) 11/01/2016   GLUCAP 144 (H) 11/01/2016     Exercise Target Goals:    Exercise Program Goal: Individual exercise prescription set with THRR, safety & activity barriers. Participant demonstrates ability to understand and report RPE using BORG scale, to self-measure pulse accurately, and to acknowledge the importance of the exercise prescription.  Exercise Prescription Goal: Starting with aerobic activity 30 plus minutes a day, 3 days per week for initial exercise prescription. Provide home exercise prescription and guidelines that participant acknowledges understanding prior to discharge.  Activity Barriers & Risk Stratification:     Activity Barriers & Cardiac Risk Stratification - 10/26/16 1408      Activity Barriers & Cardiac Risk Stratification   Activity Barriers Back Problems   Cardiac Risk Stratification High      6 Minute Walk:     6 Minute Walk    Row Name 10/26/16 1501         6 Minute Walk   Phase Initial     Distance 1600 feet     Walk Time 6 minutes     # of Rest Breaks 0     MPH 3     METS 3.9     RPE 11     VO2 Peak 13.7     Symptoms No     Resting HR 64 bpm     Resting BP 110/72     Max Ex. HR 83 bpm     Max Ex. BP 117/69     2 Minute Post BP 117/77        Oxygen Initial Assessment:   Oxygen Re-Evaluation:   Oxygen Discharge (Final Oxygen Re-Evaluation):   Initial Exercise Prescription:     Initial Exercise Prescription - 10/26/16 1500      Date of Initial Exercise RX and Referring Provider   Date 10/26/16   Referring Provider Lauree Chandler MD     Treadmill   MPH 2.5   Grade 0   Minutes 10   METs 2.5     Bike   Level 1   Minutes 10   METs 3.1     NuStep   Level 3   SPM 80   Minutes 10   METs 2.9     Prescription Details   Frequency (times per week) 3   Duration Progress to 45 minutes of aerobic exercise without signs/symptoms of  physical distress     Intensity    THRR 40-80% of Max Heartrate 69-138   Ratings of Perceived Exertion 11-13   Perceived Dyspnea 0-4     Progression   Progression Continue to progress workloads to maintain intensity without signs/symptoms of physical distress.     Resistance Training   Training Prescription Yes   Weight 2   Reps 10-15      Perform Capillary Blood Glucose checks as needed.  Exercise Prescription Changes:     Exercise Prescription Changes    Row Name 11/01/16 1700             Response to Exercise   Blood Pressure (Admit) 102/68       Blood Pressure (Exercise) 132/84       Blood Pressure (Exit) 104/60       Heart Rate (Admit) 65 bpm       Heart Rate (Exercise) 81 bpm       Heart Rate (Exit) 65 bpm       Rating of Perceived Exertion (Exercise) 11       Symptoms none       Comments pt was oriented to exercise equipment       Duration Continue with 30 min of aerobic exercise without signs/symptoms of physical distress.       Intensity THRR unchanged         Progression   Progression Continue to progress workloads to maintain intensity without signs/symptoms of physical distress.       Average METs 2.6         Resistance Training   Training Prescription Yes       Weight 2lbs       Reps 10-15       Time 10 Minutes         Bike   Level 1       Minutes 15       METs 2.95         NuStep   Level 3       SPM 70       Minutes 15       METs 2.3          Exercise Comments:     Exercise Comments    Row Name 11/15/16 1702           Exercise Comments Pt was oriented to exercise equipment on 11/01/16. Pt tolerated exercise fairly well; will continue to monitor pt's progress and activity levels.          Exercise Goals and Review:     Exercise Goals    Row Name 10/26/16 1416             Exercise Goals   Increase Physical Activity (P)  Yes       Intervention (P)  Provide advice, education, support and counseling about physical activity/exercise needs.;Develop an  individualized exercise prescription for aerobic and resistive training based on initial evaluation findings, risk stratification, comorbidities and participant's personal goals.       Expected Outcomes (P)  Achievement of increased cardiorespiratory fitness and enhanced flexibility, muscular endurance and strength shown through measurements of functional capacity and personal statement of participant.       Increase Strength and Stamina (P)  Yes       Intervention (P)  Provide advice, education, support and counseling about physical activity/exercise needs.;Develop an individualized exercise prescription for aerobic and resistive training based on initial evaluation findings, risk stratification, comorbidities  and participant's personal goals.       Expected Outcomes (P)  Achievement of increased cardiorespiratory fitness and enhanced flexibility, muscular endurance and strength shown through measurements of functional capacity and personal statement of participant.          Exercise Goals Re-Evaluation :    Discharge Exercise Prescription (Final Exercise Prescription Changes):     Exercise Prescription Changes - 11/01/16 1700      Response to Exercise   Blood Pressure (Admit) 102/68   Blood Pressure (Exercise) 132/84   Blood Pressure (Exit) 104/60   Heart Rate (Admit) 65 bpm   Heart Rate (Exercise) 81 bpm   Heart Rate (Exit) 65 bpm   Rating of Perceived Exertion (Exercise) 11   Symptoms none   Comments pt was oriented to exercise equipment   Duration Continue with 30 min of aerobic exercise without signs/symptoms of physical distress.   Intensity THRR unchanged     Progression   Progression Continue to progress workloads to maintain intensity without signs/symptoms of physical distress.   Average METs 2.6     Resistance Training   Training Prescription Yes   Weight 2lbs   Reps 10-15   Time 10 Minutes     Bike   Level 1   Minutes 15   METs 2.95     NuStep   Level 3    SPM 70   Minutes 15   METs 2.3      Nutrition:  Target Goals: Understanding of nutrition guidelines, daily intake of sodium 1500mg , cholesterol 200mg , calories 30% from fat and 7% or less from saturated fats, daily to have 5 or more servings of fruits and vegetables.  Biometrics:     Pre Biometrics - 10/26/16 1514      Pre Biometrics   Waist Circumference 44.5 inches   Hip Circumference 47.5 inches   Waist to Hip Ratio 0.94 %   Triceps Skinfold 15 mm   % Body Fat 41.9 %   Grip Strength 40 kg   Flexibility 8.5 in   Single Leg Stand 28.8 seconds       Nutrition Therapy Plan and Nutrition Goals:     Nutrition Therapy & Goals - 11/01/16 1455      Nutrition Therapy   Diet Carb Modified, Therapeutic Lifestyle Changes     Personal Nutrition Goals   Nutrition Goal Pt to identify food quantities necessary to achieve weight loss of 6-24 lb (2.7-10.9 kg) at graduation from cardiac rehab. Goal wt of 175 lb desired.      Intervention Plan   Intervention Prescribe, educate and counsel regarding individualized specific dietary modifications aiming towards targeted core components such as weight, hypertension, lipid management, diabetes, heart failure and other comorbidities.   Expected Outcomes Short Term Goal: Understand basic principles of dietary content, such as calories, fat, sodium, cholesterol and nutrients.;Long Term Goal: Adherence to prescribed nutrition plan.      Nutrition Discharge: Nutrition Scores:     Nutrition Assessments - 11/01/16 1455      MEDFICTS Scores   Pre Score 9      Nutrition Goals Re-Evaluation:   Nutrition Goals Re-Evaluation:   Nutrition Goals Discharge (Final Nutrition Goals Re-Evaluation):   Psychosocial: Target Goals: Acknowledge presence or absence of significant depression and/or stress, maximize coping skills, provide positive support system. Participant is able to verbalize types and ability to use techniques and skills needed  for reducing stress and depression.  Initial Review & Psychosocial Screening:     Initial  Psych Review & Screening - 10/26/16 1538      Initial Review   Current issues with Current Stress Concerns   Source of Stress Concerns Family   Comments Patient rated "low" - family son who is in the [employer] is leaving soon for [place]     Family Dynamics   Good Support System? Yes  pt reports significant other and two sons.     Barriers   Psychosocial barriers to participate in program The patient should benefit from training in stress management and relaxation.      Quality of Life Scores:     Quality of Life - 10/26/16 1441      Quality of Life Scores   Health/Function Pre 21.6 %   Socioeconomic Pre 28 %   Psych/Spiritual Pre 23 %   Family Pre 28.8 %   GLOBAL Pre 24.15 %      PHQ-9: Recent Review Flowsheet Data    Depression screen [facility] 2/9 11/01/2016 08/09/2016 09/13/2015   Decreased Interest 0 0 0   Down, Depressed, Hopeless 0 0 0   PHQ - 2 Score 0 0 0     Interpretation of Total Score  Total Score Depression Severity:  1-4 = Minimal depression, 5-9 = Mild depression, 10-14 = Moderate depression, 15-19 = Moderately severe depression, 20-27 = Severe depression   Psychosocial Evaluation and Intervention:     Psychosocial Evaluation - 11/01/16 1639      Psychosocial Evaluation & Interventions   Interventions Encouraged to exercise with the program and follow exercise prescription;Stress management education;Relaxation education   Comments no psychosocial needs identified, no interventions necessary    Expected Outcomes pt will exhibit positive outlook with good coping skills    Continue Psychosocial Services  No Follow up required      Psychosocial Re-Evaluation:     Psychosocial Re-Evaluation    [place] Name 11/16/16 1133             Psychosocial Re-Evaluation   Current issues with None Identified       Comments no psychosocial needs identified, no interventions  necessary.        Expected Outcomes pt will exhibit positive outlook with good coping skills.        Interventions Stress management education;Relaxation education;Encouraged to attend Cardiac Rehabilitation for the exercise       Continue Psychosocial Services  No Follow up required          Psychosocial Discharge (Final Psychosocial Re-Evaluation):     Psychosocial Re-Evaluation - 11/16/16 1133      Psychosocial Re-Evaluation   Current issues with None Identified   Comments no psychosocial needs identified, no interventions necessary.    Expected Outcomes pt will exhibit positive outlook with good coping skills.    Interventions Stress management education;Relaxation education;Encouraged to attend Cardiac Rehabilitation for the exercise   Continue Psychosocial Services  No Follow up required      Vocational Rehabilitation: Provide vocational rehab assistance to qualifying candidates.   Vocational Rehab Evaluation & Intervention:     Vocational Rehab - 10/26/16 1542      Initial Vocational Rehab Evaluation & Intervention   Assessment shows need for Vocational Rehabilitation Yes      Education: Education Goals: Education classes will be provided on a weekly basis, covering required topics. Participant will state understanding/return demonstration of topics presented.  Learning Barriers/Preferences:     Learning Barriers/Preferences - 10/26/16 1407      Learning Barriers/Preferences   Learning Barriers None  Learning Preferences Skilled Demonstration      Education Topics: Count Your Pulse:  -Group instruction provided by verbal instruction, demonstration, patient participation and written materials to support subject.  Instructors address importance of being able to find your pulse and how to count your pulse when at home without a heart monitor.  Patients get hands on experience counting their pulse with staff help and individually.   Heart Attack, Angina, and  Risk Factor Modification:  -Group instruction provided by verbal instruction, video, and written materials to support subject.  Instructors address signs and symptoms of angina and heart attacks.    Also discuss risk factors for heart disease and how to make changes to improve heart health risk factors.   Functional Fitness:  -Group instruction provided by verbal instruction, demonstration, patient participation, and written materials to support subject.  Instructors address safety measures for doing things around the house.  Discuss how to get up and down off the floor, how to pick things up properly, how to safely get out of a chair without assistance, and balance training.   Meditation and Mindfulness:  -Group instruction provided by verbal instruction, patient participation, and written materials to support subject.  Instructor addresses importance of mindfulness and meditation practice to help reduce stress and improve awareness.  Instructor also leads participants through a meditation exercise.    Stretching for Flexibility and Mobility:  -Group instruction provided by verbal instruction, patient participation, and written materials to support subject.  Instructors lead participants through series of stretches that are designed to increase flexibility thus improving mobility.  These stretches are additional exercise for major muscle groups that are typically performed during regular warm up and cool down.   Hands Only CPR:  -Group verbal, video, and participation provides a basic overview of AHA guidelines for community CPR. Role-play of emergencies allow participants the opportunity to practice calling for help and chest compression technique with discussion of AED use.   Hypertension: -Group verbal and written instruction that provides a basic overview of hypertension including the most recent diagnostic guidelines, risk factor reduction with self-care instructions and medication  management.    Nutrition I class: Heart Healthy Eating:  -Group instruction provided by PowerPoint slides, verbal discussion, and written materials to support subject matter. The instructor gives an explanation and review of the Therapeutic Lifestyle Changes diet recommendations, which includes a discussion on lipid goals, dietary fat, sodium, fiber, plant stanol/sterol esters, sugar, and the components of a well-balanced, healthy diet.   Nutrition II class: Lifestyle Skills:  -Group instruction provided by PowerPoint slides, verbal discussion, and written materials to support subject matter. The instructor gives an explanation and review of label reading, grocery shopping for heart health, heart healthy recipe modifications, and ways to make healthier choices when eating out.   Diabetes Question & Answer:  -Group instruction provided by PowerPoint slides, verbal discussion, and written materials to support subject matter. The instructor gives an explanation and review of diabetes co-morbidities, pre- and post-prandial blood glucose goals, pre-exercise blood glucose goals, signs, symptoms, and treatment of hypoglycemia and hyperglycemia, and foot care basics.   Diabetes Blitz:  -Group instruction provided by PowerPoint slides, verbal discussion, and written materials to support subject matter. The instructor gives an explanation and review of the physiology behind type 1 and type 2 diabetes, diabetes medications and rational behind using different medications, pre- and post-prandial blood glucose recommendations and Hemoglobin A1c goals, diabetes diet, and exercise including blood glucose guidelines for exercising safely.  Portion Distortion:  -Group instruction provided by PowerPoint slides, verbal discussion, written materials, and food models to support subject matter. The instructor gives an explanation of serving size versus portion size, changes in portions sizes over the last 20 years,  and what consists of a serving from each food group.   Stress Management:  -Group instruction provided by verbal instruction, video, and written materials to support subject matter.  Instructors review role of stress in heart disease and how to cope with stress positively.     Exercising on Your Own:  -Group instruction provided by verbal instruction, power point, and written materials to support subject.  Instructors discuss benefits of exercise, components of exercise, frequency and intensity of exercise, and end points for exercise.  Also discuss use of nitroglycerin and activating EMS.  Review options of places to exercise outside of rehab.  Review guidelines for sex with heart disease.   Cardiac Drugs I:  -Group instruction provided by verbal instruction and written materials to support subject.  Instructor reviews cardiac drug classes: antiplatelets, anticoagulants, beta blockers, and statins.  Instructor discusses reasons, side effects, and lifestyle considerations for each drug class.   Cardiac Drugs II:  -Group instruction provided by verbal instruction and written materials to support subject.  Instructor reviews cardiac drug classes: angiotensin converting enzyme inhibitors (ACE-I), angiotensin II receptor blockers (ARBs), nitrates, and calcium channel blockers.  Instructor discusses reasons, side effects, and lifestyle considerations for each drug class.   Anatomy and Physiology of the Circulatory System:  Group verbal and written instruction and models provide basic cardiac anatomy and physiology, with the coronary electrical and arterial systems. Review of: AMI, Angina, Valve disease, Heart Failure, Peripheral Artery Disease, Cardiac Arrhythmia, Pacemakers, and the ICD.   CARDIAC REHAB PHASE II EXERCISE from 11/01/2016 in [facility]  Date  11/01/16  Instruction Review Code  2- meets goals/outcomes      Other Education:  -Group or individual  verbal, written, or video instructions that support the educational goals of the cardiac rehab program.   Knowledge Questionnaire Score:     Knowledge Questionnaire Score - 10/26/16 1428      Knowledge Questionnaire Score   Pre Score 21/24      Core Components/Risk Factors/Patient Goals at Admission:     Personal Goals and Risk Factors at Admission - 11/01/16 1636      Core Components/Risk Factors/Patient Goals on Admission    Weight Management Obesity;Yes   Intervention Weight Management: Develop a combined nutrition and exercise program designed to reach desired caloric intake, while maintaining appropriate intake of nutrient and fiber, sodium and fats, and appropriate energy expenditure required for the weight goal.;Weight Management: Provide education and appropriate resources to help participant work on and attain dietary goals.;Weight Management/Obesity: Establish reasonable short term and long term weight goals.;Obesity: Provide education and appropriate resources to help participant work on and attain dietary goals.   Admit Weight 210 lb 15.7 oz (95.7 kg)   Expected Outcomes Short Term: Continue to assess and modify interventions until short term weight is achieved;Long Term: Adherence to nutrition and physical activity/exercise program aimed toward attainment of established weight goal;Weight Loss: Understanding of general recommendations for a balanced deficit meal plan, which promotes 1-2 lb weight loss per week and includes a negative energy balance of [phone] kcal/d;Understanding recommendations for meals to include 15-35% energy as protein, 25-35% energy from fat, 35-60% energy from carbohydrates, less than 200mg  of dietary cholesterol, 20-35 gm of total fiber daily  Diabetes Yes   Intervention Provide education about signs/symptoms and action to take for hypo/hyperglycemia.;Provide education about proper nutrition, including hydration, and aerobic/resistive exercise  prescription along with prescribed medications to achieve blood glucose in normal ranges: Fasting glucose 65-99 mg/dL   Expected Outcomes Short Term: Participant verbalizes understanding of the signs/symptoms and immediate care of hyper/hypoglycemia, proper foot care and importance of medication, aerobic/resistive exercise and nutrition plan for blood glucose control.;Long Term: Attainment of HbA1C < 7%.   Hypertension Yes   Intervention Provide education on lifestyle modifcations including regular physical activity/exercise, weight management, moderate sodium restriction and increased consumption of fresh fruit, vegetables, and low fat dairy, alcohol moderation, and smoking cessation.;Monitor prescription use compliance.   Expected Outcomes Short Term: Continued assessment and intervention until BP is < 140/13mm HG in hypertensive participants. < 130/54mm HG in hypertensive participants with diabetes, heart failure or chronic kidney disease.;Long Term: Maintenance of blood pressure at goal levels.   Lipids Yes   Intervention Provide education and support for participant on nutrition & aerobic/resistive exercise along with prescribed medications to achieve LDL 70mg , HDL >40mg .   Expected Outcomes Short Term: Participant states understanding of desired cholesterol values and is compliant with medications prescribed. Participant is following exercise prescription and nutrition guidelines.;Long Term: Cholesterol controlled with medications as prescribed, with individualized exercise RX and with personalized nutrition plan. Value goals: LDL < 70mg , HDL > 40 mg.      Core Components/Risk Factors/Patient Goals Review:      Goals and Risk Factor Review    Row Name 11/16/16 1131             Core Components/Risk Factors/Patient Goals Review   Personal Goals Review Diabetes;Hypertension;Lipids       Review pt with multiple CAD RF displays eagerness to participate in CR exercise, nutrition and lifestyle  education.       Expected Outcomes pt will participate in CR exercise,nutrition, and lifestyle education opportunities.            Core Components/Risk Factors/Patient Goals at Discharge (Final Review):      Goals and Risk Factor Review - 11/16/16 1131      Core Components/Risk Factors/Patient Goals Review   Personal Goals Review Diabetes;Hypertension;Lipids   Review pt with multiple CAD RF displays eagerness to participate in CR exercise, nutrition and lifestyle education.   Expected Outcomes pt will participate in CR exercise,nutrition, and lifestyle education opportunities.        ITP Comments:     ITP Comments    Row Name 10/26/16 1412 11/16/16 1130         ITP Comments Dr. Fransico Him, Medical Director  Dr. Fransico Him, Medical Director          Comments: Pt is making expected progress toward personal goals after completing 3 sessions. Recommend continued exercise and life style modification education including  stress management and relaxation techniques to decrease cardiac risk profile.

## 2016-11-17 ENCOUNTER — Encounter (HOSPITAL_COMMUNITY)
Admission: RE | Admit: 2016-11-17 | Discharge: 2016-11-17 | Disposition: A | Discharge status: Home / Self Care | Payer: Medicare Other | Source: Ambulatory Visit | Attending: Cardiovascular Disease | Admitting: Cardiovascular Disease

## 2016-11-17 DIAGNOSIS — M79662 Pain in left lower leg: Secondary | ICD-10-CM | POA: Diagnosis not present

## 2016-11-17 DIAGNOSIS — Z955 Presence of coronary angioplasty implant and graft: Secondary | ICD-10-CM | POA: Diagnosis not present

## 2016-11-17 DIAGNOSIS — M79604 Pain in right leg: Secondary | ICD-10-CM | POA: Diagnosis not present

## 2016-11-17 DIAGNOSIS — Z48812 Encounter for surgical aftercare following surgery on the circulatory system: Secondary | ICD-10-CM | POA: Diagnosis not present

## 2016-11-17 DIAGNOSIS — M5416 Radiculopathy, lumbar region: Secondary | ICD-10-CM | POA: Diagnosis not present

## 2016-11-17 DIAGNOSIS — M79605 Pain in left leg: Secondary | ICD-10-CM | POA: Diagnosis not present

## 2016-11-17 DIAGNOSIS — G894 Chronic pain syndrome: Secondary | ICD-10-CM | POA: Diagnosis not present

## 2016-11-17 LAB — GLUCOSE, CAPILLARY
Glucose-Capillary: 95 mg/dL (ref 65–99)
Glucose-Capillary: 116 mg/dL — ABNORMAL HIGH (ref 65–99)

## 2016-11-20 ENCOUNTER — Encounter (HOSPITAL_COMMUNITY)
Admission: RE | Admit: 2016-11-20 | Discharge: 2016-11-20 | Disposition: A | Discharge status: Home / Self Care | Payer: Medicare Other | Source: Ambulatory Visit | Attending: Cardiovascular Disease | Admitting: Cardiovascular Disease

## 2016-11-20 DIAGNOSIS — Z955 Presence of coronary angioplasty implant and graft: Secondary | ICD-10-CM | POA: Diagnosis not present

## 2016-11-20 DIAGNOSIS — Z48812 Encounter for surgical aftercare following surgery on the circulatory system: Secondary | ICD-10-CM | POA: Diagnosis not present

## 2016-11-20 LAB — GLUCOSE, CAPILLARY: Glucose-Capillary: 129 mg/dL — ABNORMAL HIGH (ref 65–99)

## 2016-11-22 ENCOUNTER — Encounter (HOSPITAL_COMMUNITY)
Admission: RE | Admit: 2016-11-22 | Discharge: 2016-11-22 | Disposition: A | Discharge status: Home / Self Care | Payer: Medicare Other | Source: Ambulatory Visit | Attending: Cardiovascular Disease | Admitting: Cardiovascular Disease

## 2016-11-22 DIAGNOSIS — Z955 Presence of coronary angioplasty implant and graft: Secondary | ICD-10-CM

## 2016-11-22 DIAGNOSIS — Z48812 Encounter for surgical aftercare following surgery on the circulatory system: Secondary | ICD-10-CM | POA: Diagnosis not present

## 2016-11-22 NOTE — Progress Notes (Signed)
Reviewed home exercise with pt today.  Pt plans to walk on treadmill for exercise.  Reviewed THR, pulse, RPE, sign and symptoms, NTG use, and when to call 911 or MD.  Also discussed weather considerations and indoor options.  Pt voiced understanding.   Sameeha Rockefeller,MS,ACSM RCEP 

## 2016-11-24 ENCOUNTER — Encounter (HOSPITAL_COMMUNITY)
Admission: RE | Admit: 2016-11-24 | Discharge: 2016-11-24 | Disposition: A | Discharge status: Home / Self Care | Payer: Medicare Other | Source: Ambulatory Visit | Attending: Cardiovascular Disease | Admitting: Cardiovascular Disease

## 2016-11-24 DIAGNOSIS — Z48812 Encounter for surgical aftercare following surgery on the circulatory system: Secondary | ICD-10-CM | POA: Diagnosis not present

## 2016-11-24 DIAGNOSIS — Z955 Presence of coronary angioplasty implant and graft: Secondary | ICD-10-CM | POA: Diagnosis not present

## 2016-11-24 NOTE — Progress Notes (Addendum)
Elizabeth Park 46 y.o. female DOB 1971-01-12 MRN [id number]       Nutrition Note  1. Status post coronary artery stent placement    Meds reviewed. Jardiance, Novolog SSI, Tresiba, Semaglutide, Janu-Met noted  Nutrition Diagnosis ? Food-and nutrition-related knowledge deficit related to lack of exposure to information as related to diagnosis of: ? CVD ? DM ? Obesity related to excessive energy intake as evidenced by a BMI of 36.2  Note Spoke with pt. Pt reports her PCP started her on Semaglutide and re-started Jardiance at a lower dose. Pt states she previously did not tolerate a higher Jardiance dose. Pt wanted clarification re: action/need for anti-diabetic medications. Pt's medications, area of action, and how diabetic medications work discussed. Pt expressed understanding of the information reviewed via feedback method.   Nutrition Goal(s):  ? Pt to identify food quantities necessary to achieve weight loss of 6-24 lb (2.7-10.9 kg) at graduation from cardiac rehab. Goal wt of 175 lb desired.   Intervention: Handout given for pt's DM medications, area of action, and how medications work.  Plan:  Pt to attend nutrition classes ? Nutrition I ? Nutrition II ? Portion Distortion ? Diabetes Blitz ? Diabetes Q & A Will provide client-centered nutrition education as part of interdisciplinary care.   Monitor and evaluate progress toward nutrition goal with team.  Derek Mound, M.Ed, RD, LDN, CDE 11/24/2016 2:13 PM   Handout given:   Diabetic Medication Area of action How it works       Novolog (Insulin Aspart) Provides insulin that your Prandial (before meal) insulin       pancreas would provide          when you eat.                             Tyler Aas (Insulin Degludec) Provides insulin that your Basal insulin          pancreas would provide          when you are not eating                             Semaglutide Digestive system Slows digestion and helps lower blood sugar; weight  loss      Positive cardiovascular (heart) side effects                         Sitagliptin-Metformin  Slows digestion & helps lower blood sugar; no weight gain    (Januvia-Metformin)          Januvia Digestive system ?Fasting & after meal blood sugar by slowing bloodsugar      absorption                  Metformin Liver, muscle/fat cells ?liver production of blood sugar &         ? absorption of blood sugar in your muscle & fat tissue                        Jardiance (Empagliflozin) Kidneys Decreases blood sugar by releasing blood sugar in urine     Positive cardiovascular (heart) side effects

## 2016-11-27 ENCOUNTER — Encounter (HOSPITAL_COMMUNITY): Payer: Medicare Other

## 2016-11-28 DIAGNOSIS — Z0000 Encounter for general adult medical examination without abnormal findings: Secondary | ICD-10-CM | POA: Diagnosis not present

## 2016-11-29 ENCOUNTER — Encounter (HOSPITAL_COMMUNITY): Admission: RE | Admit: 2016-11-29 | Payer: Medicare Other | Source: Ambulatory Visit

## 2016-12-01 ENCOUNTER — Encounter (HOSPITAL_COMMUNITY)
Admission: RE | Admit: 2016-12-01 | Discharge: 2016-12-01 | Disposition: A | Discharge status: Home / Self Care | Payer: Medicare Other | Source: Ambulatory Visit | Attending: Cardiovascular Disease | Admitting: Cardiovascular Disease

## 2016-12-01 DIAGNOSIS — Z955 Presence of coronary angioplasty implant and graft: Secondary | ICD-10-CM | POA: Diagnosis not present

## 2016-12-01 DIAGNOSIS — Z48812 Encounter for surgical aftercare following surgery on the circulatory system: Secondary | ICD-10-CM | POA: Diagnosis not present

## 2016-12-01 LAB — GLUCOSE, CAPILLARY: Glucose-Capillary: 125 mg/dL — ABNORMAL HIGH (ref 65–99)

## 2016-12-01 NOTE — Progress Notes (Signed)
Pt arrived at cardiac rehab  C/o nausea and vomiting following her semaglutide dose on Tuesday.  N/V lasted approximately 12 hours with hypoglycemia,  [id number].   Pt states she does not have glucose tabs or glucagon available at home. PC to Dr. Fransico Setters office to advise. Dr. Fransico Setters nurse recommended pt hold semaglutide and move  office f/u appointment sooner than 12/27/2016.  Pt informed and verbalized understanding. Pt will get glucose tabs at [facility] for home use.

## 2016-12-05 DIAGNOSIS — I10 Essential (primary) hypertension: Secondary | ICD-10-CM | POA: Diagnosis not present

## 2016-12-05 DIAGNOSIS — E089 Diabetes mellitus due to underlying condition without complications: Secondary | ICD-10-CM | POA: Diagnosis not present

## 2016-12-06 ENCOUNTER — Encounter (HOSPITAL_COMMUNITY)
Admission: RE | Admit: 2016-12-06 | Discharge: 2016-12-06 | Disposition: A | Discharge status: Home / Self Care | Payer: Medicare Other | Source: Ambulatory Visit | Attending: Cardiovascular Disease | Admitting: Cardiovascular Disease

## 2016-12-06 DIAGNOSIS — Z955 Presence of coronary angioplasty implant and graft: Secondary | ICD-10-CM

## 2016-12-06 DIAGNOSIS — Z48812 Encounter for surgical aftercare following surgery on the circulatory system: Secondary | ICD-10-CM | POA: Diagnosis not present

## 2016-12-06 NOTE — Progress Notes (Signed)
Elizabeth Park 46 y.o. female DOB 1970-09-07 MRN [id number]       Nutrition Note  1. Status post coronary artery stent placement    Meds reviewed. Jardiance, Novolog SSI, Tresiba, Semaglutide, Janu-Met noted Nutrition Note Spoke with pt. Nutrition Plan and Nutrition Survey goals reviewed with pt. Pt is following Step 2 of the Therapeutic Lifestyle Changes diet. Pt expressed understanding of the information reviewed.   Nutrition Diagnosis ? Food-and nutrition-related knowledge deficit related to lack of exposure to information as related to diagnosis of: ? CVD ? DM ? Obesity related to excessive energy intake as evidenced by a BMI of 36.2  Nutrition Goal(s):  ? Pt to identify food quantities necessary to achieve weight loss of 6-24 lb (2.7-10.9 kg) at graduation from cardiac rehab. Goal wt of 175 lb desired.   Intervention: ? Pt's individual nutrition plan reviewed with pt. ? Benefits of adopting Therapeutic Lifestyle Changes discussed when Medficts reviewed.    Plan:  Pt to attend nutrition classes ? Nutrition I ? Nutrition II ? Portion Distortion ? Diabetes Blitz ? Diabetes Q & A Will provide client-centered nutrition education as part of interdisciplinary care.   Monitor and evaluate progress toward nutrition goal with team.  Derek Mound, M.Ed, RD, LDN, CDE 12/06/2016 2:29 PM

## 2016-12-08 ENCOUNTER — Encounter (HOSPITAL_COMMUNITY): Payer: Medicare Other

## 2016-12-11 ENCOUNTER — Encounter (HOSPITAL_COMMUNITY)
Admission: RE | Admit: 2016-12-11 | Discharge: 2016-12-11 | Disposition: A | Discharge status: Home / Self Care | Payer: Medicare Other | Source: Ambulatory Visit | Attending: Cardiovascular Disease | Admitting: Cardiovascular Disease

## 2016-12-11 DIAGNOSIS — Z48812 Encounter for surgical aftercare following surgery on the circulatory system: Secondary | ICD-10-CM | POA: Diagnosis not present

## 2016-12-11 DIAGNOSIS — Z955 Presence of coronary angioplasty implant and graft: Secondary | ICD-10-CM | POA: Diagnosis not present

## 2016-12-13 ENCOUNTER — Encounter (HOSPITAL_COMMUNITY): Payer: Medicare Other

## 2016-12-13 NOTE — Progress Notes (Signed)
Cardiac Individual Treatment Plan  Patient Details  Name: Elizabeth Park MRN: [id number] Date of Birth: [birth year] Referring Provider:     CARDIAC REHAB PHASE II ORIENTATION from 10/26/2016 in [place]  Referring Provider  Lauree Chandler MD      Initial Encounter Date:    CARDIAC REHAB PHASE II ORIENTATION from 10/26/2016 in [place]  Date  10/26/16  Referring Provider  Lauree Chandler MD      Visit Diagnosis: Status post coronary artery stent placement  Patient's Home Medications on Admission:  Current Outpatient Prescriptions:  .  amLODipine (NORVASC) 5 MG tablet, Take 5 mg by mouth 2 (two) times daily., Disp: , Rfl:  .  aspirin 81 MG tablet, Take 81 mg by mouth daily. , Disp: , Rfl:  .  carvedilol (COREG) 12.5 MG tablet, Take 12.5 mg by mouth 2 (two) times daily., Disp: , Rfl:  .  clopidogrel (PLAVIX) 75 MG tablet, Take 1 tablet (75 mg total) by mouth daily with breakfast., Disp: 90 tablet, Rfl: 4 .  empagliflozin (JARDIANCE) 10 MG TABS tablet, Take 10 mg by mouth daily., Disp: , Rfl:  .  fluticasone (FLONASE) 50 MCG/ACT nasal spray, Place 1 spray into both nostrils 2 (two) times daily. , Disp: , Rfl:  .  furosemide (LASIX) 20 MG tablet, Take one tablet by mouth daily as needed for swelling, Disp: 20 tablet, Rfl: 4 .  gabapentin (NEURONTIN) 300 MG capsule, Take 300 mg by mouth 2 (two) times daily., Disp: , Rfl:  .  insulin aspart (NOVOLOG FLEXPEN) 100 UNIT/ML injection, Inject 0-12 Units into the skin See admin instructions. Per sliding scale Under 150 no units 150-199 take 8 units 200-249 take 10 units 250-299 take 12 units, Disp: , Rfl:  .  lisinopril (PRINIVIL,ZESTRIL) 20 MG tablet, Take 20 mg by mouth daily., Disp: , Rfl:  .  montelukast (SINGULAIR) 10 MG tablet, Take 10 mg by mouth at bedtime., Disp: , Rfl:  .  Naloxone HCl (EVZIO) 0.4 MG/0.4ML SOAJ, Inject 0.4 mg as directed as needed (for possible  overdose). , Disp: , Rfl:  .  nitroGLYCERIN (NITROSTAT) 0.4 MG SL tablet, Place 1 tablet (0.4 mg total) under the tongue every 5 (five) minutes as needed for chest pain., Disp: 25 tablet, Rfl: 6 .  oxyCODONE-acetaminophen (PERCOCET) 10-325 MG per tablet, Take 1 tablet by mouth 3 (three) times daily., Disp: , Rfl:  .  rosuvastatin (CRESTOR) 10 MG tablet, Take 10 mg by mouth every evening. , Disp: , Rfl:  .  Semaglutide (OZEMPIC) 0.25 or 0.5 MG/DOSE SOPN, Inject 25 mg into the skin once a week. .25mg  x4 weeks, .5mg  x2 weeks, Disp: , Rfl:  .  SitaGLIPtin-MetFORMIN HCl (JANUMET XR) 50-1000 MG TB24, Take 1 tablet by mouth 2 (two) times daily., Disp: , Rfl:  .  TRESIBA FLEXTOUCH 100 UNIT/ML SOPN FlexTouch Pen, Inject 40 Units as directed at bedtime. , Disp: , Rfl: 4  Past Medical History: Past Medical History:  Diagnosis Date  . CHF (congestive heart failure) ([place])   . Coronary artery disease   . Diabetes mellitus   . H/O emotional problems   . Headache, migraine   . High cholesterol   . History of blood transfusion   . Hypertension   . MRSA (methicillin resistant Staphylococcus aureus)   . MVP (mitral valve prolapse)   . Neuromuscular disorder ([place])   . Neuropathy   . Sebaceous cyst     Tobacco  Use: History  Smoking Status  . Former Smoker  . Packs/day: 1.00  . Years: 28.00  . Types: Cigarettes  . Quit date: 03/31/2009  Smokeless Tobacco  . Never Used    Labs: Recent Review Flowsheet Data    Labs for ITP Cardiac and Pulmonary Rehab Latest Ref Rng & Units 04/10/2009 04/10/2009 04/26/2009 05/28/2009 09/02/2009   Cholestrol 0 - 200 mg/dL - - - - -   LDLCALC 0 - 99 mg/dL - - - - -   HDL >39 mg/dL - - - - -   Trlycerides <150 mg/dL - - - - -   Hemoglobin A1c % - - 8.0(H) - 6.5   PHART 7.350 - 7.400 - - - 7.454(H) -   PCO2ART 35.0 - 45.0 mmHg - - - 33.9(L) -   HCO3 20.0 - 24.0 mEq/L - - - 23.4 -   TCO2 0 - 100 mmol/L 42 39 - 24.5 -   ACIDBASEDEF 0.0 - 2.0 mmol/L - - - 0.0 -   O2SAT %  - - - 97.9 -      Capillary Blood Glucose: Lab Results  Component Value Date   GLUCAP 125 (H) 12/01/2016   GLUCAP 129 (H) 11/20/2016   GLUCAP 116 (H) 11/17/2016   GLUCAP 95 11/17/2016   GLUCAP 120 (H) 11/15/2016     Exercise Target Goals:    Exercise Program Goal: Individual exercise prescription set with THRR, safety & activity barriers. Participant demonstrates ability to understand and report RPE using BORG scale, to self-measure pulse accurately, and to acknowledge the importance of the exercise prescription.  Exercise Prescription Goal: Starting with aerobic activity 30 plus minutes a day, 3 days per week for initial exercise prescription. Provide home exercise prescription and guidelines that participant acknowledges understanding prior to discharge.  Activity Barriers & Risk Stratification:     Activity Barriers & Cardiac Risk Stratification - 10/26/16 1408      Activity Barriers & Cardiac Risk Stratification   Activity Barriers Back Problems   Cardiac Risk Stratification High      6 Minute Walk:     6 Minute Walk    Row Name 10/26/16 1501         6 Minute Walk   Phase Initial     Distance 1600 feet     Walk Time 6 minutes     # of Rest Breaks 0     MPH 3     METS 3.9     RPE 11     VO2 Peak 13.7     Symptoms No     Resting HR 64 bpm     Resting BP 110/72     Max Ex. HR 83 bpm     Max Ex. BP 117/69     2 Minute Post BP 117/77        Oxygen Initial Assessment:   Oxygen Re-Evaluation:   Oxygen Discharge (Final Oxygen Re-Evaluation):   Initial Exercise Prescription:     Initial Exercise Prescription - 10/26/16 1500      Date of Initial Exercise RX and Referring Provider   Date 10/26/16   Referring Provider Lauree Chandler MD     Treadmill   MPH 2.5   Grade 0   Minutes 10   METs 2.5     Bike   Level 1   Minutes 10   METs 3.1     NuStep   Level 3   SPM 80   Minutes 10  METs 2.9     Prescription Details    Frequency (times per week) 3   Duration Progress to 45 minutes of aerobic exercise without signs/symptoms of physical distress     Intensity   THRR 40-80% of Max Heartrate 69-138   Ratings of Perceived Exertion 11-13   Perceived Dyspnea 0-4     Progression   Progression Continue to progress workloads to maintain intensity without signs/symptoms of physical distress.     Resistance Training   Training Prescription Yes   Weight 2   Reps 10-15      Perform Capillary Blood Glucose checks as needed.  Exercise Prescription Changes:     Exercise Prescription Changes    Row Name 11/01/16 1700 12/11/16 1700           Response to Exercise   Blood Pressure (Admit) 102/68 106/60      Blood Pressure (Exercise) 132/84 114/70      Blood Pressure (Exit) 104/60 102/62      Heart Rate (Admit) 65 bpm 72 bpm      Heart Rate (Exercise) 81 bpm 83 bpm      Heart Rate (Exit) 65 bpm 68 bpm      Rating of Perceived Exertion (Exercise) 11 11      Symptoms none none      Comments pt was oriented to exercise equipment pt was oriented to exercise equipment      Duration Continue with 30 min of aerobic exercise without signs/symptoms of physical distress. Continue with 30 min of aerobic exercise without signs/symptoms of physical distress.      Intensity THRR unchanged THRR unchanged        Progression   Progression Continue to progress workloads to maintain intensity without signs/symptoms of physical distress. Continue to progress workloads to maintain intensity without signs/symptoms of physical distress.      Average METs 2.6 2.5        Resistance Training   Training Prescription Yes Yes      Weight 2lbs 2lbs      Reps 10-15 10-15      Time 10 Minutes 10 Minutes        Bike   Level 1 1      Minutes 15 15      METs 2.95 2.95        NuStep   Level 3 4      SPM 70 70      Minutes 15 15      METs 2.3 2.1        Home Exercise Plan   Plans to continue exercise at  - Home (comment)   walking on treadmill      Frequency  - Add 2 additional days to program exercise sessions.      Initial Home Exercises Provided  - 11/22/16         Exercise Comments:     Exercise Comments    Row Name 11/15/16 1702 12/12/16 1439         Exercise Comments Pt was oriented to exercise equipment on 11/01/16. Pt tolerated exercise fairly well; will continue to monitor pt's progress and activity levels. Reviewed and METs goals. Pt is tolerating exercise well; will continue to monitor pt's progress.         Exercise Goals and Review:     Exercise Goals    Row Name 10/26/16 1416 12/12/16 1439           Exercise Goals   Increase  Physical Activity (P)  Yes Yes      Intervention (P)  Provide advice, education, support and counseling about physical activity/exercise needs.;Develop an individualized exercise prescription for aerobic and resistive training based on initial evaluation findings, risk stratification, comorbidities and participant's personal goals. Provide advice, education, support and counseling about physical activity/exercise needs.;Develop an individualized exercise prescription for aerobic and resistive training based on initial evaluation findings, risk stratification, comorbidities and participant's personal goals.      Expected Outcomes (P)  Achievement of increased cardiorespiratory fitness and enhanced flexibility, muscular endurance and strength shown through measurements of functional capacity and personal statement of participant. Achievement of increased cardiorespiratory fitness and enhanced flexibility, muscular endurance and strength shown through measurements of functional capacity and personal statement of participant.      Increase Strength and Stamina (P)  Yes Yes      Intervention (P)  Provide advice, education, support and counseling about physical activity/exercise needs.;Develop an individualized exercise prescription for aerobic and resistive training based  on initial evaluation findings, risk stratification, comorbidities and participant's personal goals. Provide advice, education, support and counseling about physical activity/exercise needs.;Develop an individualized exercise prescription for aerobic and resistive training based on initial evaluation findings, risk stratification, comorbidities and participant's personal goals.      Expected Outcomes (P)  Achievement of increased cardiorespiratory fitness and enhanced flexibility, muscular endurance and strength shown through measurements of functional capacity and personal statement of participant. Achievement of increased cardiorespiratory fitness and enhanced flexibility, muscular endurance and strength shown through measurements of functional capacity and personal statement of participant.      Able to understand and use rate of perceived exertion (RPE) scale  - Yes      Intervention  - Provide education and explanation on how to use RPE scale      Expected Outcomes  - Short Term: Able to use RPE daily in rehab to express subjective intensity level;Long Term:  Able to use RPE to guide intensity level when exercising independently      Knowledge and understanding of Target Heart Rate Range (THRR)  - Yes      Intervention  - Provide education and explanation of THRR including how the numbers were predicted and where they are located for reference      Expected Outcomes  - Short Term: Able to state/look up THRR;Long Term: Able to use THRR to govern intensity when exercising independently;Short Term: Able to use daily as guideline for intensity in rehab      Able to check pulse independently  - Yes      Intervention  - Provide education and demonstration on how to check pulse in carotid and radial arteries.;Review the importance of being able to check your own pulse for safety during independent exercise      Expected Outcomes  - Short Term: Able to explain why pulse checking is important during  independent exercise;Long Term: Able to check pulse independently and accurately      Understanding of Exercise Prescription  - Yes      Intervention  - Provide education, explanation, and written materials on patient's individual exercise prescription      Expected Outcomes  - Short Term: Able to explain program exercise prescription;Long Term: Able to explain home exercise prescription to exercise independently         Exercise Goals Re-Evaluation :     Exercise Goals Re-Evaluation    Row Name 11/22/16 1502 12/12/16 1439  Exercise Goal Re-Evaluation   Exercise Goals Review Increase Physical Activity;Increase Strenth and Stamina Increase Physical Activity;Able to understand and use rate of perceived exertion (RPE) scale;Understanding of Exercise Prescription;Knowledge and understanding of Target Heart Rate Range (THRR);Increase Strength and Stamina;Able to check pulse independently      Comments Reviewed home exercise with pt today.  Pt plans to walk on treadmill for exercise.  Reviewed THR, pulse, RPE, sign and symptoms, NTG use, and when to call 911 or MD.  Also discussed weather considerations and indoor options.  Pt voiced understanding. Reviewed home exercise with pt today.  Pt plans to walk on treadmill for exercise.  Reviewed THR, pulse, RPE, sign and symptoms, NTG use, and when to call 911 or MD.  Also discussed weather considerations and indoor options.  Pt voiced understanding.      Expected Outcomes Pt will be compliant with HEP and improve in cardiorespiratory fitness Pt will be compliant with HEP and improve in cardiorespiratory fitness          Discharge Exercise Prescription (Final Exercise Prescription Changes):     Exercise Prescription Changes - 12/11/16 1700      Response to Exercise   Blood Pressure (Admit) 106/60   Blood Pressure (Exercise) 114/70   Blood Pressure (Exit) 102/62   Heart Rate (Admit) 72 bpm   Heart Rate (Exercise) 83 bpm   Heart Rate  (Exit) 68 bpm   Rating of Perceived Exertion (Exercise) 11   Symptoms none   Comments pt was oriented to exercise equipment   Duration Continue with 30 min of aerobic exercise without signs/symptoms of physical distress.   Intensity THRR unchanged     Progression   Progression Continue to progress workloads to maintain intensity without signs/symptoms of physical distress.   Average METs 2.5     Resistance Training   Training Prescription Yes   Weight 2lbs   Reps 10-15   Time 10 Minutes     Bike   Level 1   Minutes 15   METs 2.95     NuStep   Level 4   SPM 70   Minutes 15   METs 2.1     Home Exercise Plan   Plans to continue exercise at Home (comment)  walking on treadmill   Frequency Add 2 additional days to program exercise sessions.   Initial Home Exercises Provided 11/22/16      Nutrition:  Target Goals: Understanding of nutrition guidelines, daily intake of sodium 1500mg , cholesterol 200mg , calories 30% from fat and 7% or less from saturated fats, daily to have 5 or more servings of fruits and vegetables.  Biometrics:     Pre Biometrics - 10/26/16 1514      Pre Biometrics   Waist Circumference 44.5 inches   Hip Circumference 47.5 inches   Waist to Hip Ratio 0.94 %   Triceps Skinfold 15 mm   % Body Fat 41.9 %   Grip Strength 40 kg   Flexibility 8.5 in   Single Leg Stand 28.8 seconds       Nutrition Therapy Plan and Nutrition Goals:     Nutrition Therapy & Goals - 11/01/16 1455      Nutrition Therapy   Diet Carb Modified, Therapeutic Lifestyle Changes     Personal Nutrition Goals   Nutrition Goal Pt to identify food quantities necessary to achieve weight loss of 6-24 lb (2.7-10.9 kg) at graduation from cardiac rehab. Goal wt of 175 lb desired.      Intervention  Plan   Intervention Prescribe, educate and counsel regarding individualized specific dietary modifications aiming towards targeted core components such as weight, hypertension, lipid  management, diabetes, heart failure and other comorbidities.   Expected Outcomes Short Term Goal: Understand basic principles of dietary content, such as calories, fat, sodium, cholesterol and nutrients.;Long Term Goal: Adherence to prescribed nutrition plan.      Nutrition Discharge: Nutrition Scores:     Nutrition Assessments - 11/01/16 1455      MEDFICTS Scores   Pre Score 9      Nutrition Goals Re-Evaluation:   Nutrition Goals Re-Evaluation:   Nutrition Goals Discharge (Final Nutrition Goals Re-Evaluation):   Psychosocial: Target Goals: Acknowledge presence or absence of significant depression and/or stress, maximize coping skills, provide positive support system. Participant is able to verbalize types and ability to use techniques and skills needed for reducing stress and depression.  Initial Review & Psychosocial Screening:     Initial Psych Review & Screening - 10/26/16 1538      Initial Review   Current issues with Current Stress Concerns   Source of Stress Concerns Family   Comments Patient rated "low" - family son who is in the [employer] is leaving soon for [place]     Family Dynamics   Good Support System? Yes  pt reports significant other and two sons.     Barriers   Psychosocial barriers to participate in program The patient should benefit from training in stress management and relaxation.      Quality of Life Scores:     Quality of Life - 11/24/16 1535      Quality of Life Scores   Health/Function Pre --  pt reports decreased overall symptoms c/o increased anxiety related dyspnea.  pt also concerned about diabetes medications. discussed with Derek Mound, RD, CDE who will schedule to meet with pt to discuss diabetic regimen.    Socioeconomic Pre --  pt interested in vocational rehab. pt is also pursuing disability   GLOBAL Pre --  pt offered emotional support and reassurance.  Dr. Criss Rosales manages diabetes, next f/u 12/27/2016.  pt instructed to discuss  lipid questions with Dr. Angelena Form      PHQ-9: Recent Review Flowsheet Data    Depression screen [facility] 2/9 11/01/2016 08/09/2016 09/13/2015   Decreased Interest 0 0 0   Down, Depressed, Hopeless 0 0 0   PHQ - 2 Score 0 0 0     Interpretation of Total Score  Total Score Depression Severity:  1-4 = Minimal depression, 5-9 = Mild depression, 10-14 = Moderate depression, 15-19 = Moderately severe depression, 20-27 = Severe depression   Psychosocial Evaluation and Intervention:     Psychosocial Evaluation - 11/01/16 1639      Psychosocial Evaluation & Interventions   Interventions Encouraged to exercise with the program and follow exercise prescription;Stress management education;Relaxation education   Comments no psychosocial needs identified, no interventions necessary    Expected Outcomes pt will exhibit positive outlook with good coping skills    Continue Psychosocial Services  No Follow up required      Psychosocial Re-Evaluation:     Psychosocial Re-Evaluation    [place] Name 11/16/16 1133 12/13/16 1818           Psychosocial Re-Evaluation   Current issues with None Identified None Identified      Comments no psychosocial needs identified, no interventions necessary.  no psychosocial needs identified, no interventions necessary.       Expected Outcomes pt will exhibit positive  outlook with good coping skills.  pt will exhibit positive outlook with good coping skills.       Interventions Stress management education;Relaxation education;Encouraged to attend Cardiac Rehabilitation for the exercise Stress management education;Relaxation education;Encouraged to attend Cardiac Rehabilitation for the exercise      Continue Psychosocial Services  No Follow up required No Follow up required         Psychosocial Discharge (Final Psychosocial Re-Evaluation):     Psychosocial Re-Evaluation - 12/13/16 1818      Psychosocial Re-Evaluation   Current issues with None Identified   Comments  no psychosocial needs identified, no interventions necessary.    Expected Outcomes pt will exhibit positive outlook with good coping skills.    Interventions Stress management education;Relaxation education;Encouraged to attend Cardiac Rehabilitation for the exercise   Continue Psychosocial Services  No Follow up required      Vocational Rehabilitation: Provide vocational rehab assistance to qualifying candidates.   Vocational Rehab Evaluation & Intervention:     Vocational Rehab - 11/22/16 1404      Initial Vocational Rehab Evaluation & Intervention   Assessment shows need for Vocational Rehabilitation Yes   Vocational Rehab Packet given to patient 11/22/16      Education: Education Goals: Education classes will be provided on a weekly basis, covering required topics. Participant will state understanding/return demonstration of topics presented.  Learning Barriers/Preferences:     Learning Barriers/Preferences - 10/26/16 1407      Learning Barriers/Preferences   Learning Barriers None   Learning Preferences Skilled Demonstration      Education Topics: Count Your Pulse:  -Group instruction provided by verbal instruction, demonstration, patient participation and written materials to support subject.  Instructors address importance of being able to find your pulse and how to count your pulse when at home without a heart monitor.  Patients get hands on experience counting their pulse with staff help and individually.   Heart Attack, Angina, and Risk Factor Modification:  -Group instruction provided by verbal instruction, video, and written materials to support subject.  Instructors address signs and symptoms of angina and heart attacks.    Also discuss risk factors for heart disease and how to make changes to improve heart health risk factors.   Functional Fitness:  -Group instruction provided by verbal instruction, demonstration, patient participation, and written materials  to support subject.  Instructors address safety measures for doing things around the house.  Discuss how to get up and down off the floor, how to pick things up properly, how to safely get out of a chair without assistance, and balance training.   Meditation and Mindfulness:  -Group instruction provided by verbal instruction, patient participation, and written materials to support subject.  Instructor addresses importance of mindfulness and meditation practice to help reduce stress and improve awareness.  Instructor also leads participants through a meditation exercise.    Stretching for Flexibility and Mobility:  -Group instruction provided by verbal instruction, patient participation, and written materials to support subject.  Instructors lead participants through series of stretches that are designed to increase flexibility thus improving mobility.  These stretches are additional exercise for major muscle groups that are typically performed during regular warm up and cool down.   Hands Only CPR:  -Group verbal, video, and participation provides a basic overview of AHA guidelines for community CPR. Role-play of emergencies allow participants the opportunity to practice calling for help and chest compression technique with discussion of AED use.   Hypertension: -Group verbal and written  instruction that provides a basic overview of hypertension including the most recent diagnostic guidelines, risk factor reduction with self-care instructions and medication management.    Nutrition I class: Heart Healthy Eating:  -Group instruction provided by PowerPoint slides, verbal discussion, and written materials to support subject matter. The instructor gives an explanation and review of the Therapeutic Lifestyle Changes diet recommendations, which includes a discussion on lipid goals, dietary fat, sodium, fiber, plant stanol/sterol esters, sugar, and the components of a well-balanced, healthy  diet.   Nutrition II class: Lifestyle Skills:  -Group instruction provided by PowerPoint slides, verbal discussion, and written materials to support subject matter. The instructor gives an explanation and review of label reading, grocery shopping for heart health, heart healthy recipe modifications, and ways to make healthier choices when eating out.   Diabetes Question & Answer:  -Group instruction provided by PowerPoint slides, verbal discussion, and written materials to support subject matter. The instructor gives an explanation and review of diabetes co-morbidities, pre- and post-prandial blood glucose goals, pre-exercise blood glucose goals, signs, symptoms, and treatment of hypoglycemia and hyperglycemia, and foot care basics.   Diabetes Blitz:  -Group instruction provided by PowerPoint slides, verbal discussion, and written materials to support subject matter. The instructor gives an explanation and review of the physiology behind type 1 and type 2 diabetes, diabetes medications and rational behind using different medications, pre- and post-prandial blood glucose recommendations and Hemoglobin A1c goals, diabetes diet, and exercise including blood glucose guidelines for exercising safely.    Portion Distortion:  -Group instruction provided by PowerPoint slides, verbal discussion, written materials, and food models to support subject matter. The instructor gives an explanation of serving size versus portion size, changes in portions sizes over the last 20 years, and what consists of a serving from each food group.   Stress Management:  -Group instruction provided by verbal instruction, video, and written materials to support subject matter.  Instructors review role of stress in heart disease and how to cope with stress positively.     Exercising on Your Own:  -Group instruction provided by verbal instruction, power point, and written materials to support subject.  Instructors discuss  benefits of exercise, components of exercise, frequency and intensity of exercise, and end points for exercise.  Also discuss use of nitroglycerin and activating EMS.  Review options of places to exercise outside of rehab.  Review guidelines for sex with heart disease.   CARDIAC REHAB PHASE II EXERCISE from 12/06/2016 in [place]  Date  12/06/16  Educator  EP  Instruction Review Code  2- meets goals/outcomes      Cardiac Drugs I:  -Group instruction provided by verbal instruction and written materials to support subject.  Instructor reviews cardiac drug classes: antiplatelets, anticoagulants, beta blockers, and statins.  Instructor discusses reasons, side effects, and lifestyle considerations for each drug class.   Cardiac Drugs II:  -Group instruction provided by verbal instruction and written materials to support subject.  Instructor reviews cardiac drug classes: angiotensin converting enzyme inhibitors (ACE-I), angiotensin II receptor blockers (ARBs), nitrates, and calcium channel blockers.  Instructor discusses reasons, side effects, and lifestyle considerations for each drug class.   Anatomy and Physiology of the Circulatory System:  Group verbal and written instruction and models provide basic cardiac anatomy and physiology, with the coronary electrical and arterial systems. Review of: AMI, Angina, Valve disease, Heart Failure, Peripheral Artery Disease, Cardiac Arrhythmia, Pacemakers, and the ICD.   CARDIAC REHAB PHASE II EXERCISE from  12/06/2016 in [place]  Date  11/01/16  Instruction Review Code  2- meets goals/outcomes      Other Education:  -Group or individual verbal, written, or video instructions that support the educational goals of the cardiac rehab program.   Knowledge Questionnaire Score:     Knowledge Questionnaire Score - 10/26/16 1428      Knowledge Questionnaire Score   Pre Score 21/24       Core Components/Risk Factors/Patient Goals at Admission:     Personal Goals and Risk Factors at Admission - 11/01/16 1636      Core Components/Risk Factors/Patient Goals on Admission    Weight Management Obesity;Yes   Intervention Weight Management: Develop a combined nutrition and exercise program designed to reach desired caloric intake, while maintaining appropriate intake of nutrient and fiber, sodium and fats, and appropriate energy expenditure required for the weight goal.;Weight Management: Provide education and appropriate resources to help participant work on and attain dietary goals.;Weight Management/Obesity: Establish reasonable short term and long term weight goals.;Obesity: Provide education and appropriate resources to help participant work on and attain dietary goals.   Admit Weight 210 lb 15.7 oz (95.7 kg)   Expected Outcomes Short Term: Continue to assess and modify interventions until short term weight is achieved;Long Term: Adherence to nutrition and physical activity/exercise program aimed toward attainment of established weight goal;Weight Loss: Understanding of general recommendations for a balanced deficit meal plan, which promotes 1-2 lb weight loss per week and includes a negative energy balance of [phone] kcal/d;Understanding recommendations for meals to include 15-35% energy as protein, 25-35% energy from fat, 35-60% energy from carbohydrates, less than 200mg  of dietary cholesterol, 20-35 gm of total fiber daily   Diabetes Yes   Intervention Provide education about signs/symptoms and action to take for hypo/hyperglycemia.;Provide education about proper nutrition, including hydration, and aerobic/resistive exercise prescription along with prescribed medications to achieve blood glucose in normal ranges: Fasting glucose 65-99 mg/dL   Expected Outcomes Short Term: Participant verbalizes understanding of the signs/symptoms and immediate care of hyper/hypoglycemia, proper  foot care and importance of medication, aerobic/resistive exercise and nutrition plan for blood glucose control.;Long Term: Attainment of HbA1C < 7%.   Hypertension Yes   Intervention Provide education on lifestyle modifcations including regular physical activity/exercise, weight management, moderate sodium restriction and increased consumption of fresh fruit, vegetables, and low fat dairy, alcohol moderation, and smoking cessation.;Monitor prescription use compliance.   Expected Outcomes Short Term: Continued assessment and intervention until BP is < 140/52mm HG in hypertensive participants. < 130/80mm HG in hypertensive participants with diabetes, heart failure or chronic kidney disease.;Long Term: Maintenance of blood pressure at goal levels.   Lipids Yes   Intervention Provide education and support for participant on nutrition & aerobic/resistive exercise along with prescribed medications to achieve LDL 70mg , HDL >40mg .   Expected Outcomes Short Term: Participant states understanding of desired cholesterol values and is compliant with medications prescribed. Participant is following exercise prescription and nutrition guidelines.;Long Term: Cholesterol controlled with medications as prescribed, with individualized exercise RX and with personalized nutrition plan. Value goals: LDL < 70mg , HDL > 40 mg.      Core Components/Risk Factors/Patient Goals Review:      Goals and Risk Factor Review    Row Name 11/16/16 1131 12/13/16 1818           Core Components/Risk Factors/Patient Goals Review   Personal Goals Review Diabetes;Hypertension;Lipids Diabetes;Hypertension;Lipids      Review pt with multiple CAD RF displays  eagerness to participate in CR exercise, nutrition and lifestyle education. pt with multiple CAD RF displays eagerness to participate in CR exercise, nutrition and lifestyle education.      Expected Outcomes pt will participate in CR exercise,nutrition, and lifestyle education  opportunities.   pt will participate in CR exercise,nutrition, and lifestyle education opportunities.           Core Components/Risk Factors/Patient Goals at Discharge (Final Review):      Goals and Risk Factor Review - 12/13/16 1818      Core Components/Risk Factors/Patient Goals Review   Personal Goals Review Diabetes;Hypertension;Lipids   Review pt with multiple CAD RF displays eagerness to participate in CR exercise, nutrition and lifestyle education.   Expected Outcomes pt will participate in CR exercise,nutrition, and lifestyle education opportunities.        ITP Comments:     ITP Comments    Row Name 10/26/16 1412 11/16/16 1130 12/13/16 1818       ITP Comments Dr. Fransico Him, Medical Director  Dr. Fransico Him, Medical Director  Dr. Fransico Him, Medical Director         Comments: Pt is making expected progress toward personal goals after completing 10 sessions. Recommend continued exercise and life style modification education including  stress management and relaxation techniques to decrease cardiac risk profile.

## 2016-12-15 ENCOUNTER — Encounter (HOSPITAL_COMMUNITY): Payer: Medicare Other

## 2016-12-18 ENCOUNTER — Encounter (HOSPITAL_COMMUNITY)
Admission: RE | Admit: 2016-12-18 | Discharge: 2016-12-18 | Disposition: A | Discharge status: Home / Self Care | Payer: Medicare Other | Source: Ambulatory Visit | Attending: Cardiovascular Disease | Admitting: Cardiovascular Disease

## 2016-12-18 DIAGNOSIS — Z955 Presence of coronary angioplasty implant and graft: Secondary | ICD-10-CM | POA: Diagnosis not present

## 2016-12-18 DIAGNOSIS — Z48812 Encounter for surgical aftercare following surgery on the circulatory system: Secondary | ICD-10-CM | POA: Diagnosis not present

## 2016-12-20 ENCOUNTER — Encounter (HOSPITAL_COMMUNITY)
Admission: RE | Admit: 2016-12-20 | Discharge: 2016-12-20 | Disposition: A | Discharge status: Home / Self Care | Payer: Medicare Other | Source: Ambulatory Visit | Attending: Cardiovascular Disease | Admitting: Cardiovascular Disease

## 2016-12-20 DIAGNOSIS — Z955 Presence of coronary angioplasty implant and graft: Secondary | ICD-10-CM

## 2016-12-20 DIAGNOSIS — Z48812 Encounter for surgical aftercare following surgery on the circulatory system: Secondary | ICD-10-CM | POA: Diagnosis not present

## 2016-12-22 ENCOUNTER — Encounter (HOSPITAL_COMMUNITY): Payer: Medicare Other

## 2016-12-25 ENCOUNTER — Encounter (HOSPITAL_COMMUNITY): Payer: Medicare Other

## 2016-12-27 ENCOUNTER — Encounter (HOSPITAL_COMMUNITY)
Admission: RE | Admit: 2016-12-27 | Discharge: 2016-12-27 | Disposition: A | Discharge status: Home / Self Care | Payer: Medicare Other | Source: Ambulatory Visit | Attending: Cardiovascular Disease | Admitting: Cardiovascular Disease

## 2016-12-27 DIAGNOSIS — M130 Polyarthritis, unspecified: Secondary | ICD-10-CM | POA: Diagnosis not present

## 2016-12-27 DIAGNOSIS — E089 Diabetes mellitus due to underlying condition without complications: Secondary | ICD-10-CM | POA: Diagnosis not present

## 2016-12-27 DIAGNOSIS — E669 Obesity, unspecified: Secondary | ICD-10-CM | POA: Diagnosis not present

## 2016-12-27 DIAGNOSIS — Z955 Presence of coronary angioplasty implant and graft: Secondary | ICD-10-CM | POA: Diagnosis not present

## 2016-12-27 DIAGNOSIS — Z48812 Encounter for surgical aftercare following surgery on the circulatory system: Secondary | ICD-10-CM | POA: Diagnosis not present

## 2016-12-27 DIAGNOSIS — I10 Essential (primary) hypertension: Secondary | ICD-10-CM | POA: Diagnosis not present

## 2016-12-27 LAB — GLUCOSE, CAPILLARY: Glucose-Capillary: 175 mg/dL — ABNORMAL HIGH (ref 65–99)

## 2016-12-29 ENCOUNTER — Encounter (HOSPITAL_COMMUNITY)
Admission: RE | Admit: 2016-12-29 | Discharge: 2016-12-29 | Disposition: A | Discharge status: Home / Self Care | Payer: Medicare Other | Source: Ambulatory Visit | Attending: Cardiovascular Disease | Admitting: Cardiovascular Disease

## 2016-12-29 DIAGNOSIS — Z955 Presence of coronary angioplasty implant and graft: Secondary | ICD-10-CM | POA: Diagnosis not present

## 2016-12-29 DIAGNOSIS — Z48812 Encounter for surgical aftercare following surgery on the circulatory system: Secondary | ICD-10-CM | POA: Diagnosis not present

## 2017-01-01 ENCOUNTER — Encounter (HOSPITAL_COMMUNITY)
Admission: RE | Admit: 2017-01-01 | Discharge: 2017-01-01 | Disposition: A | Discharge status: Home / Self Care | Payer: Medicare Other | Source: Ambulatory Visit | Attending: Cardiovascular Disease | Admitting: Cardiovascular Disease

## 2017-01-01 DIAGNOSIS — Z955 Presence of coronary angioplasty implant and graft: Secondary | ICD-10-CM

## 2017-01-01 DIAGNOSIS — E1165 Type 2 diabetes mellitus with hyperglycemia: Secondary | ICD-10-CM | POA: Diagnosis not present

## 2017-01-01 DIAGNOSIS — Z48812 Encounter for surgical aftercare following surgery on the circulatory system: Secondary | ICD-10-CM | POA: Diagnosis not present

## 2017-01-01 LAB — GLUCOSE, CAPILLARY
Glucose-Capillary: 110 mg/dL — ABNORMAL HIGH (ref 65–99)
Glucose-Capillary: 139 mg/dL — ABNORMAL HIGH (ref 65–99)

## 2017-01-03 ENCOUNTER — Encounter (HOSPITAL_COMMUNITY)
Admission: RE | Admit: 2017-01-03 | Discharge: 2017-01-03 | Disposition: A | Discharge status: Home / Self Care | Payer: Medicare Other | Source: Ambulatory Visit | Attending: Cardiovascular Disease | Admitting: Cardiovascular Disease

## 2017-01-03 DIAGNOSIS — Z955 Presence of coronary angioplasty implant and graft: Secondary | ICD-10-CM

## 2017-01-03 DIAGNOSIS — Z48812 Encounter for surgical aftercare following surgery on the circulatory system: Secondary | ICD-10-CM | POA: Diagnosis not present

## 2017-01-05 ENCOUNTER — Encounter (HOSPITAL_COMMUNITY): Payer: Medicare Other

## 2017-01-08 ENCOUNTER — Encounter (HOSPITAL_COMMUNITY)
Admission: RE | Admit: 2017-01-08 | Discharge: 2017-01-08 | Disposition: A | Discharge status: Home / Self Care | Payer: Medicare Other | Source: Ambulatory Visit | Attending: Cardiovascular Disease | Admitting: Cardiovascular Disease

## 2017-01-08 ENCOUNTER — Ambulatory Visit: Payer: Medicare Other | Admitting: Podiatry

## 2017-01-08 DIAGNOSIS — Z955 Presence of coronary angioplasty implant and graft: Secondary | ICD-10-CM | POA: Diagnosis not present

## 2017-01-08 DIAGNOSIS — Z48812 Encounter for surgical aftercare following surgery on the circulatory system: Secondary | ICD-10-CM | POA: Diagnosis not present

## 2017-01-09 ENCOUNTER — Encounter (HOSPITAL_COMMUNITY): Payer: Self-pay

## 2017-01-09 NOTE — Progress Notes (Deleted)
Cardiac Individual Treatment Plan  Patient Details  Name: Elizabeth Park MRN: [id number] Date of Birth: Oct 17, 1970 Referring Provider:     CARDIAC REHAB PHASE II ORIENTATION from 10/26/2016 in [place]  Referring Provider  Lauree Chandler MD      Initial Encounter Date:    CARDIAC REHAB PHASE II ORIENTATION from 10/26/2016 in [place]  Date  10/26/16  Referring Provider  Lauree Chandler MD      Visit Diagnosis: Status post coronary artery stent placement  Patient's Home Medications on Admission:  Current Outpatient Prescriptions:  .  amLODipine (NORVASC) 5 MG tablet, Take 5 mg by mouth 2 (two) times daily., Disp: , Rfl:  .  aspirin 81 MG tablet, Take 81 mg by mouth daily. , Disp: , Rfl:  .  carvedilol (COREG) 12.5 MG tablet, Take 12.5 mg by mouth 2 (two) times daily., Disp: , Rfl:  .  clopidogrel (PLAVIX) 75 MG tablet, Take 1 tablet (75 mg total) by mouth daily with breakfast., Disp: 90 tablet, Rfl: 4 .  empagliflozin (JARDIANCE) 10 MG TABS tablet, Take 10 mg by mouth daily., Disp: , Rfl:  .  fluticasone (FLONASE) 50 MCG/ACT nasal spray, Place 1 spray into both nostrils 2 (two) times daily. , Disp: , Rfl:  .  furosemide (LASIX) 20 MG tablet, Take one tablet by mouth daily as needed for swelling, Disp: 20 tablet, Rfl: 4 .  gabapentin (NEURONTIN) 300 MG capsule, Take 300 mg by mouth 2 (two) times daily., Disp: , Rfl:  .  insulin aspart (NOVOLOG FLEXPEN) 100 UNIT/ML injection, Inject 0-12 Units into the skin See admin instructions. Per sliding scale Under 150 no units 150-199 take 8 units 200-249 take 10 units 250-299 take 12 units, Disp: , Rfl:  .  lisinopril (PRINIVIL,ZESTRIL) 20 MG tablet, Take 20 mg by mouth daily., Disp: , Rfl:  .  montelukast (SINGULAIR) 10 MG tablet, Take 10 mg by mouth at bedtime., Disp: , Rfl:  .  Naloxone HCl (EVZIO) 0.4 MG/0.4ML SOAJ, Inject 0.4 mg as directed as needed (for possible  overdose). , Disp: , Rfl:  .  nitroGLYCERIN (NITROSTAT) 0.4 MG SL tablet, Place 1 tablet (0.4 mg total) under the tongue every 5 (five) minutes as needed for chest pain., Disp: 25 tablet, Rfl: 6 .  oxyCODONE-acetaminophen (PERCOCET) 10-325 MG per tablet, Take 1 tablet by mouth 3 (three) times daily., Disp: , Rfl:  .  rosuvastatin (CRESTOR) 10 MG tablet, Take 10 mg by mouth every evening. , Disp: , Rfl:  .  Semaglutide (OZEMPIC) 0.25 or 0.5 MG/DOSE SOPN, Inject 25 mg into the skin once a week. .25mg  x4 weeks, .5mg  x2 weeks, Disp: , Rfl:  .  SitaGLIPtin-MetFORMIN HCl (JANUMET XR) 50-1000 MG TB24, Take 1 tablet by mouth 2 (two) times daily., Disp: , Rfl:  .  TRESIBA FLEXTOUCH 100 UNIT/ML SOPN FlexTouch Pen, Inject 40 Units as directed at bedtime. , Disp: , Rfl: 4  Past Medical History: Past Medical History:  Diagnosis Date  . CHF (congestive heart failure) ([place])   . Coronary artery disease   . Diabetes mellitus   . H/O emotional problems   . Headache, migraine   . High cholesterol   . History of blood transfusion   . Hypertension   . MRSA (methicillin resistant Staphylococcus aureus)   . MVP (mitral valve prolapse)   . Neuromuscular disorder ([place])   . Neuropathy   . Sebaceous cyst     Tobacco  Use: History  Smoking Status  . Former Smoker  . Packs/day: 1.00  . Years: 28.00  . Types: Cigarettes  . Quit date: 03/31/2009  Smokeless Tobacco  . Never Used    Labs: Recent Review Flowsheet Data    Labs for ITP Cardiac and Pulmonary Rehab Latest Ref Rng & Units 04/10/2009 04/10/2009 04/26/2009 05/28/2009 09/02/2009   Cholestrol 0 - 200 mg/dL - - - - -   LDLCALC 0 - 99 mg/dL - - - - -   HDL >39 mg/dL - - - - -   Trlycerides <150 mg/dL - - - - -   Hemoglobin A1c % - - 8.0(H) - 6.5   PHART 7.350 - 7.400 - - - 7.454(H) -   PCO2ART 35.0 - 45.0 mmHg - - - 33.9(L) -   HCO3 20.0 - 24.0 mEq/L - - - 23.4 -   TCO2 0 - 100 mmol/L 42 39 - 24.5 -   ACIDBASEDEF 0.0 - 2.0 mmol/L - - - 0.0 -   O2SAT %  - - - 97.9 -      Capillary Blood Glucose: Lab Results  Component Value Date   GLUCAP 139 (H) 01/01/2017   GLUCAP 110 (H) 01/01/2017   GLUCAP 175 (H) 12/27/2016   GLUCAP 125 (H) 12/01/2016   GLUCAP 129 (H) 11/20/2016     Exercise Target Goals:    Exercise Program Goal: Individual exercise prescription set with THRR, safety & activity barriers. Participant demonstrates ability to understand and report RPE using BORG scale, to self-measure pulse accurately, and to acknowledge the importance of the exercise prescription.  Exercise Prescription Goal: Starting with aerobic activity 30 plus minutes a day, 3 days per week for initial exercise prescription. Provide home exercise prescription and guidelines that participant acknowledges understanding prior to discharge.  Activity Barriers & Risk Stratification:     Activity Barriers & Cardiac Risk Stratification - 10/26/16 1408      Activity Barriers & Cardiac Risk Stratification   Activity Barriers Back Problems   Cardiac Risk Stratification High      6 Minute Walk:     6 Minute Walk    Row Name 10/26/16 1501         6 Minute Walk   Phase Initial     Distance 1600 feet     Walk Time 6 minutes     # of Rest Breaks 0     MPH 3     METS 3.9     RPE 11     VO2 Peak 13.7     Symptoms No     Resting HR 64 bpm     Resting BP 110/72     Max Ex. HR 83 bpm     Max Ex. BP 117/69     2 Minute Post BP 117/77        Oxygen Initial Assessment:   Oxygen Re-Evaluation:   Oxygen Discharge (Final Oxygen Re-Evaluation):   Initial Exercise Prescription:     Initial Exercise Prescription - 10/26/16 1500      Date of Initial Exercise RX and Referring Provider   Date 10/26/16   Referring Provider Lauree Chandler MD     Treadmill   MPH 2.5   Grade 0   Minutes 10   METs 2.5     Bike   Level 1   Minutes 10   METs 3.1     NuStep   Level 3   SPM 80   Minutes 10  METs 2.9     Prescription Details    Frequency (times per week) 3   Duration Progress to 45 minutes of aerobic exercise without signs/symptoms of physical distress     Intensity   THRR 40-80% of Max Heartrate 69-138   Ratings of Perceived Exertion 11-13   Perceived Dyspnea 0-4     Progression   Progression Continue to progress workloads to maintain intensity without signs/symptoms of physical distress.     Resistance Training   Training Prescription Yes   Weight 2   Reps 10-15      Perform Capillary Blood Glucose checks as needed.  Exercise Prescription Changes:     Exercise Prescription Changes    Row Name 11/01/16 1700 12/11/16 1700           Response to Exercise   Blood Pressure (Admit) 102/68 106/60      Blood Pressure (Exercise) 132/84 114/70      Blood Pressure (Exit) 104/60 102/62      Heart Rate (Admit) 65 bpm 72 bpm      Heart Rate (Exercise) 81 bpm 83 bpm      Heart Rate (Exit) 65 bpm 68 bpm      Rating of Perceived Exertion (Exercise) 11 11      Symptoms none none      Comments pt was oriented to exercise equipment pt was oriented to exercise equipment      Duration Continue with 30 min of aerobic exercise without signs/symptoms of physical distress. Continue with 30 min of aerobic exercise without signs/symptoms of physical distress.      Intensity THRR unchanged THRR unchanged        Progression   Progression Continue to progress workloads to maintain intensity without signs/symptoms of physical distress. Continue to progress workloads to maintain intensity without signs/symptoms of physical distress.      Average METs 2.6 2.5        Resistance Training   Training Prescription Yes Yes      Weight 2lbs 2lbs      Reps 10-15 10-15      Time 10 Minutes 10 Minutes        Bike   Level 1 1      Minutes 15 15      METs 2.95 2.95        NuStep   Level 3 4      SPM 70 70      Minutes 15 15      METs 2.3 2.1        Home Exercise Plan   Plans to continue exercise at  - Home (comment)   walking on treadmill      Frequency  - Add 2 additional days to program exercise sessions.      Initial Home Exercises Provided  - 11/22/16         Exercise Comments:     Exercise Comments    Row Name 11/15/16 1702 12/12/16 1439         Exercise Comments Pt was oriented to exercise equipment on 11/01/16. Pt tolerated exercise fairly well; will continue to monitor pt's progress and activity levels. Reviewed and METs goals. Pt is tolerating exercise well; will continue to monitor pt's progress.         Exercise Goals and Review:     Exercise Goals    Row Name 10/26/16 1416 12/12/16 1439           Exercise Goals   Increase  Physical Activity (P)  Yes Yes      Intervention (P)  Provide advice, education, support and counseling about physical activity/exercise needs.;Develop an individualized exercise prescription for aerobic and resistive training based on initial evaluation findings, risk stratification, comorbidities and participant's personal goals. Provide advice, education, support and counseling about physical activity/exercise needs.;Develop an individualized exercise prescription for aerobic and resistive training based on initial evaluation findings, risk stratification, comorbidities and participant's personal goals.      Expected Outcomes (P)  Achievement of increased cardiorespiratory fitness and enhanced flexibility, muscular endurance and strength shown through measurements of functional capacity and personal statement of participant. Achievement of increased cardiorespiratory fitness and enhanced flexibility, muscular endurance and strength shown through measurements of functional capacity and personal statement of participant.      Increase Strength and Stamina (P)  Yes Yes      Intervention (P)  Provide advice, education, support and counseling about physical activity/exercise needs.;Develop an individualized exercise prescription for aerobic and resistive training based  on initial evaluation findings, risk stratification, comorbidities and participant's personal goals. Provide advice, education, support and counseling about physical activity/exercise needs.;Develop an individualized exercise prescription for aerobic and resistive training based on initial evaluation findings, risk stratification, comorbidities and participant's personal goals.      Expected Outcomes (P)  Achievement of increased cardiorespiratory fitness and enhanced flexibility, muscular endurance and strength shown through measurements of functional capacity and personal statement of participant. Achievement of increased cardiorespiratory fitness and enhanced flexibility, muscular endurance and strength shown through measurements of functional capacity and personal statement of participant.      Able to understand and use rate of perceived exertion (RPE) scale  - Yes      Intervention  - Provide education and explanation on how to use RPE scale      Expected Outcomes  - Short Term: Able to use RPE daily in rehab to express subjective intensity level;Long Term:  Able to use RPE to guide intensity level when exercising independently      Knowledge and understanding of Target Heart Rate Range (THRR)  - Yes      Intervention  - Provide education and explanation of THRR including how the numbers were predicted and where they are located for reference      Expected Outcomes  - Short Term: Able to state/look up THRR;Long Term: Able to use THRR to govern intensity when exercising independently;Short Term: Able to use daily as guideline for intensity in rehab      Able to check pulse independently  - Yes      Intervention  - Provide education and demonstration on how to check pulse in carotid and radial arteries.;Review the importance of being able to check your own pulse for safety during independent exercise      Expected Outcomes  - Short Term: Able to explain why pulse checking is important during  independent exercise;Long Term: Able to check pulse independently and accurately      Understanding of Exercise Prescription  - Yes      Intervention  - Provide education, explanation, and written materials on patient's individual exercise prescription      Expected Outcomes  - Short Term: Able to explain program exercise prescription;Long Term: Able to explain home exercise prescription to exercise independently         Exercise Goals Re-Evaluation :     Exercise Goals Re-Evaluation    Row Name 11/22/16 1502 12/12/16 1439  Exercise Goal Re-Evaluation   Exercise Goals Review Increase Physical Activity;Increase Strenth and Stamina Increase Physical Activity;Able to understand and use rate of perceived exertion (RPE) scale;Understanding of Exercise Prescription;Knowledge and understanding of Target Heart Rate Range (THRR);Increase Strength and Stamina;Able to check pulse independently      Comments Reviewed home exercise with pt today.  Pt plans to walk on treadmill for exercise.  Reviewed THR, pulse, RPE, sign and symptoms, NTG use, and when to call 911 or MD.  Also discussed weather considerations and indoor options.  Pt voiced understanding. Reviewed home exercise with pt today.  Pt plans to walk on treadmill for exercise.  Reviewed THR, pulse, RPE, sign and symptoms, NTG use, and when to call 911 or MD.  Also discussed weather considerations and indoor options.  Pt voiced understanding.      Expected Outcomes Pt will be compliant with HEP and improve in cardiorespiratory fitness Pt will be compliant with HEP and improve in cardiorespiratory fitness          Discharge Exercise Prescription (Final Exercise Prescription Changes):     Exercise Prescription Changes - 12/11/16 1700      Response to Exercise   Blood Pressure (Admit) 106/60   Blood Pressure (Exercise) 114/70   Blood Pressure (Exit) 102/62   Heart Rate (Admit) 72 bpm   Heart Rate (Exercise) 83 bpm   Heart Rate  (Exit) 68 bpm   Rating of Perceived Exertion (Exercise) 11   Symptoms none   Comments pt was oriented to exercise equipment   Duration Continue with 30 min of aerobic exercise without signs/symptoms of physical distress.   Intensity THRR unchanged     Progression   Progression Continue to progress workloads to maintain intensity without signs/symptoms of physical distress.   Average METs 2.5     Resistance Training   Training Prescription Yes   Weight 2lbs   Reps 10-15   Time 10 Minutes     Bike   Level 1   Minutes 15   METs 2.95     NuStep   Level 4   SPM 70   Minutes 15   METs 2.1     Home Exercise Plan   Plans to continue exercise at Home (comment)  walking on treadmill   Frequency Add 2 additional days to program exercise sessions.   Initial Home Exercises Provided 11/22/16      Nutrition:  Target Goals: Understanding of nutrition guidelines, daily intake of sodium 1500mg , cholesterol 200mg , calories 30% from fat and 7% or less from saturated fats, daily to have 5 or more servings of fruits and vegetables.  Biometrics:     Pre Biometrics - 10/26/16 1514      Pre Biometrics   Waist Circumference 44.5 inches   Hip Circumference 47.5 inches   Waist to Hip Ratio 0.94 %   Triceps Skinfold 15 mm   % Body Fat 41.9 %   Grip Strength 40 kg   Flexibility 8.5 in   Single Leg Stand 28.8 seconds       Nutrition Therapy Plan and Nutrition Goals:     Nutrition Therapy & Goals - 11/01/16 1455      Nutrition Therapy   Diet Carb Modified, Therapeutic Lifestyle Changes     Personal Nutrition Goals   Nutrition Goal Pt to identify food quantities necessary to achieve weight loss of 6-24 lb (2.7-10.9 kg) at graduation from cardiac rehab. Goal wt of 175 lb desired.      Intervention  Plan   Intervention Prescribe, educate and counsel regarding individualized specific dietary modifications aiming towards targeted core components such as weight, hypertension, lipid  management, diabetes, heart failure and other comorbidities.   Expected Outcomes Short Term Goal: Understand basic principles of dietary content, such as calories, fat, sodium, cholesterol and nutrients.;Long Term Goal: Adherence to prescribed nutrition plan.      Nutrition Discharge: Nutrition Scores:     Nutrition Assessments - 11/01/16 1455      MEDFICTS Scores   Pre Score 9      Nutrition Goals Re-Evaluation:   Nutrition Goals Re-Evaluation:   Nutrition Goals Discharge (Final Nutrition Goals Re-Evaluation):   Psychosocial: Target Goals: Acknowledge presence or absence of significant depression and/or stress, maximize coping skills, provide positive support system. Participant is able to verbalize types and ability to use techniques and skills needed for reducing stress and depression.  Initial Review & Psychosocial Screening:     Initial Psych Review & Screening - 10/26/16 1538      Initial Review   Current issues with Current Stress Concerns   Source of Stress Concerns Family   Comments Patient rated "low" - family son who is in the [employer] is leaving soon for [place]     Family Dynamics   Good Support System? Yes  pt reports significant other and two sons.     Barriers   Psychosocial barriers to participate in program The patient should benefit from training in stress management and relaxation.      Quality of Life Scores:     Quality of Life - 11/24/16 1535      Quality of Life Scores   Health/Function Pre --  pt reports decreased overall symptoms c/o increased anxiety related dyspnea.  pt also concerned about diabetes medications. discussed with Derek Mound, RD, CDE who will schedule to meet with pt to discuss diabetic regimen.    Socioeconomic Pre --  pt interested in vocational rehab. pt is also pursuing disability   GLOBAL Pre --  pt offered emotional support and reassurance.  Dr. Criss Rosales manages diabetes, next f/u 12/27/2016.  pt instructed to discuss  lipid questions with Dr. Angelena Form      PHQ-9: Recent Review Flowsheet Data    Depression screen [facility] 2/9 11/01/2016 08/09/2016 09/13/2015   Decreased Interest 0 0 0   Down, Depressed, Hopeless 0 0 0   PHQ - 2 Score 0 0 0     Interpretation of Total Score  Total Score Depression Severity:  1-4 = Minimal depression, 5-9 = Mild depression, 10-14 = Moderate depression, 15-19 = Moderately severe depression, 20-27 = Severe depression   Psychosocial Evaluation and Intervention:     Psychosocial Evaluation - 11/01/16 1639      Psychosocial Evaluation & Interventions   Interventions Encouraged to exercise with the program and follow exercise prescription;Stress management education;Relaxation education   Comments no psychosocial needs identified, no interventions necessary    Expected Outcomes pt will exhibit positive outlook with good coping skills    Continue Psychosocial Services  No Follow up required      Psychosocial Re-Evaluation:     Psychosocial Re-Evaluation    [place] Name 11/16/16 1133 12/13/16 1818 01/09/17 0717         Psychosocial Re-Evaluation   Current issues with None Identified None Identified None Identified     Comments no psychosocial needs identified, no interventions necessary.  no psychosocial needs identified, no interventions necessary.  no psychosocial needs identified, no interventions necessary.  Expected Outcomes pt will exhibit positive outlook with good coping skills.  pt will exhibit positive outlook with good coping skills.  pt will exhibit positive outlook with good coping skills.      Interventions Stress management education;Relaxation education;Encouraged to attend Cardiac Rehabilitation for the exercise Stress management education;Relaxation education;Encouraged to attend Cardiac Rehabilitation for the exercise Stress management education;Relaxation education;Encouraged to attend Cardiac Rehabilitation for the exercise     Continue Psychosocial  Services  No Follow up required No Follow up required No Follow up required        Psychosocial Discharge (Final Psychosocial Re-Evaluation):     Psychosocial Re-Evaluation - 01/09/17 0717      Psychosocial Re-Evaluation   Current issues with None Identified   Comments no psychosocial needs identified, no interventions necessary.    Expected Outcomes pt will exhibit positive outlook with good coping skills.    Interventions Stress management education;Relaxation education;Encouraged to attend Cardiac Rehabilitation for the exercise   Continue Psychosocial Services  No Follow up required      Vocational Rehabilitation: Provide vocational rehab assistance to qualifying candidates.   Vocational Rehab Evaluation & Intervention:     Vocational Rehab - 11/22/16 1404      Initial Vocational Rehab Evaluation & Intervention   Assessment shows need for Vocational Rehabilitation Yes   Vocational Rehab Packet given to patient 11/22/16      Education: Education Goals: Education classes will be provided on a weekly basis, covering required topics. Participant will state understanding/return demonstration of topics presented.  Learning Barriers/Preferences:     Learning Barriers/Preferences - 10/26/16 1407      Learning Barriers/Preferences   Learning Barriers None   Learning Preferences Skilled Demonstration      Education Topics: Count Your Pulse:  -Group instruction provided by verbal instruction, demonstration, patient participation and written materials to support subject.  Instructors address importance of being able to find your pulse and how to count your pulse when at home without a heart monitor.  Patients get hands on experience counting their pulse with staff help and individually.   Heart Attack, Angina, and Risk Factor Modification:  -Group instruction provided by verbal instruction, video, and written materials to support subject.  Instructors address signs and  symptoms of angina and heart attacks.    Also discuss risk factors for heart disease and how to make changes to improve heart health risk factors.   Functional Fitness:  -Group instruction provided by verbal instruction, demonstration, patient participation, and written materials to support subject.  Instructors address safety measures for doing things around the house.  Discuss how to get up and down off the floor, how to pick things up properly, how to safely get out of a chair without assistance, and balance training.   Meditation and Mindfulness:  -Group instruction provided by verbal instruction, patient participation, and written materials to support subject.  Instructor addresses importance of mindfulness and meditation practice to help reduce stress and improve awareness.  Instructor also leads participants through a meditation exercise.    Stretching for Flexibility and Mobility:  -Group instruction provided by verbal instruction, patient participation, and written materials to support subject.  Instructors lead participants through series of stretches that are designed to increase flexibility thus improving mobility.  These stretches are additional exercise for major muscle groups that are typically performed during regular warm up and cool down.   Hands Only CPR:  -Group verbal, video, and participation provides a basic overview of AHA guidelines for community CPR.  Role-play of emergencies allow participants the opportunity to practice calling for help and chest compression technique with discussion of AED use.   Hypertension: -Group verbal and written instruction that provides a basic overview of hypertension including the most recent diagnostic guidelines, risk factor reduction with self-care instructions and medication management.    Nutrition I class: Heart Healthy Eating:  -Group instruction provided by PowerPoint slides, verbal discussion, and written materials to support  subject matter. The instructor gives an explanation and review of the Therapeutic Lifestyle Changes diet recommendations, which includes a discussion on lipid goals, dietary fat, sodium, fiber, plant stanol/sterol esters, sugar, and the components of a well-balanced, healthy diet.   Nutrition II class: Lifestyle Skills:  -Group instruction provided by PowerPoint slides, verbal discussion, and written materials to support subject matter. The instructor gives an explanation and review of label reading, grocery shopping for heart health, heart healthy recipe modifications, and ways to make healthier choices when eating out.   Diabetes Question & Answer:  -Group instruction provided by PowerPoint slides, verbal discussion, and written materials to support subject matter. The instructor gives an explanation and review of diabetes co-morbidities, pre- and post-prandial blood glucose goals, pre-exercise blood glucose goals, signs, symptoms, and treatment of hypoglycemia and hyperglycemia, and foot care basics.   Diabetes Blitz:  -Group instruction provided by PowerPoint slides, verbal discussion, and written materials to support subject matter. The instructor gives an explanation and review of the physiology behind type 1 and type 2 diabetes, diabetes medications and rational behind using different medications, pre- and post-prandial blood glucose recommendations and Hemoglobin A1c goals, diabetes diet, and exercise including blood glucose guidelines for exercising safely.    Portion Distortion:  -Group instruction provided by PowerPoint slides, verbal discussion, written materials, and food models to support subject matter. The instructor gives an explanation of serving size versus portion size, changes in portions sizes over the last 20 years, and what consists of a serving from each food group.   Stress Management:  -Group instruction provided by verbal instruction, video, and written materials to  support subject matter.  Instructors review role of stress in heart disease and how to cope with stress positively.     Exercising on Your Own:  -Group instruction provided by verbal instruction, power point, and written materials to support subject.  Instructors discuss benefits of exercise, components of exercise, frequency and intensity of exercise, and end points for exercise.  Also discuss use of nitroglycerin and activating EMS.  Review options of places to exercise outside of rehab.  Review guidelines for sex with heart disease.   CARDIAC REHAB PHASE II EXERCISE from 12/06/2016 in [place]  Date  12/06/16  Educator  EP  Instruction Review Code  2- meets goals/outcomes      Cardiac Drugs I:  -Group instruction provided by verbal instruction and written materials to support subject.  Instructor reviews cardiac drug classes: antiplatelets, anticoagulants, beta blockers, and statins.  Instructor discusses reasons, side effects, and lifestyle considerations for each drug class.   Cardiac Drugs II:  -Group instruction provided by verbal instruction and written materials to support subject.  Instructor reviews cardiac drug classes: angiotensin converting enzyme inhibitors (ACE-I), angiotensin II receptor blockers (ARBs), nitrates, and calcium channel blockers.  Instructor discusses reasons, side effects, and lifestyle considerations for each drug class.   Anatomy and Physiology of the Circulatory System:  Group verbal and written instruction and models provide basic cardiac anatomy and physiology, with the coronary electrical  and arterial systems. Review of: AMI, Angina, Valve disease, Heart Failure, Peripheral Artery Disease, Cardiac Arrhythmia, Pacemakers, and the ICD.   CARDIAC REHAB PHASE II EXERCISE from 12/06/2016 in [place]  Date  11/01/16  Instruction Review Code  2- meets goals/outcomes      Other Education:   -Group or individual verbal, written, or video instructions that support the educational goals of the cardiac rehab program.   Knowledge Questionnaire Score:     Knowledge Questionnaire Score - 10/26/16 1428      Knowledge Questionnaire Score   Pre Score 21/24      Core Components/Risk Factors/Patient Goals at Admission:     Personal Goals and Risk Factors at Admission - 11/01/16 1636      Core Components/Risk Factors/Patient Goals on Admission    Weight Management Obesity;Yes   Intervention Weight Management: Develop a combined nutrition and exercise program designed to reach desired caloric intake, while maintaining appropriate intake of nutrient and fiber, sodium and fats, and appropriate energy expenditure required for the weight goal.;Weight Management: Provide education and appropriate resources to help participant work on and attain dietary goals.;Weight Management/Obesity: Establish reasonable short term and long term weight goals.;Obesity: Provide education and appropriate resources to help participant work on and attain dietary goals.   Admit Weight 210 lb 15.7 oz (95.7 kg)   Expected Outcomes Short Term: Continue to assess and modify interventions until short term weight is achieved;Long Term: Adherence to nutrition and physical activity/exercise program aimed toward attainment of established weight goal;Weight Loss: Understanding of general recommendations for a balanced deficit meal plan, which promotes 1-2 lb weight loss per week and includes a negative energy balance of [phone] kcal/d;Understanding recommendations for meals to include 15-35% energy as protein, 25-35% energy from fat, 35-60% energy from carbohydrates, less than 200mg  of dietary cholesterol, 20-35 gm of total fiber daily   Diabetes Yes   Intervention Provide education about signs/symptoms and action to take for hypo/hyperglycemia.;Provide education about proper nutrition, including hydration, and  aerobic/resistive exercise prescription along with prescribed medications to achieve blood glucose in normal ranges: Fasting glucose 65-99 mg/dL   Expected Outcomes Short Term: Participant verbalizes understanding of the signs/symptoms and immediate care of hyper/hypoglycemia, proper foot care and importance of medication, aerobic/resistive exercise and nutrition plan for blood glucose control.;Long Term: Attainment of HbA1C < 7%.   Hypertension Yes   Intervention Provide education on lifestyle modifcations including regular physical activity/exercise, weight management, moderate sodium restriction and increased consumption of fresh fruit, vegetables, and low fat dairy, alcohol moderation, and smoking cessation.;Monitor prescription use compliance.   Expected Outcomes Short Term: Continued assessment and intervention until BP is < 140/21mm HG in hypertensive participants. < 130/41mm HG in hypertensive participants with diabetes, heart failure or chronic kidney disease.;Long Term: Maintenance of blood pressure at goal levels.   Lipids Yes   Intervention Provide education and support for participant on nutrition & aerobic/resistive exercise along with prescribed medications to achieve LDL 70mg , HDL >40mg .   Expected Outcomes Short Term: Participant states understanding of desired cholesterol values and is compliant with medications prescribed. Participant is following exercise prescription and nutrition guidelines.;Long Term: Cholesterol controlled with medications as prescribed, with individualized exercise RX and with personalized nutrition plan. Value goals: LDL < 70mg , HDL > 40 mg.      Core Components/Risk Factors/Patient Goals Review:      Goals and Risk Factor Review    Row Name 11/16/16 1131 12/13/16 1818 01/09/17 9811  Core Components/Risk Factors/Patient Goals Review   Personal Goals Review Diabetes;Hypertension;Lipids Diabetes;Hypertension;Lipids Diabetes;Hypertension;Lipids      Review pt with multiple CAD RF displays eagerness to participate in CR exercise, nutrition and lifestyle education. pt with multiple CAD RF displays eagerness to participate in CR exercise, nutrition and lifestyle education. pt with multiple CAD RF displays eagerness to participate in CR exercise, nutrition and lifestyle education. pt blood sugar normalizing with current treatment.  pt BP WNL     Expected Outcomes pt will participate in CR exercise,nutrition, and lifestyle education opportunities.   pt will participate in CR exercise,nutrition, and lifestyle education opportunities.   pt will participate in CR exercise,nutrition, and lifestyle education opportunities.          Core Components/Risk Factors/Patient Goals at Discharge (Final Review):      Goals and Risk Factor Review - 01/09/17 0716      Core Components/Risk Factors/Patient Goals Review   Personal Goals Review Diabetes;Hypertension;Lipids   Review pt with multiple CAD RF displays eagerness to participate in CR exercise, nutrition and lifestyle education. pt blood sugar normalizing with current treatment.  pt BP WNL   Expected Outcomes pt will participate in CR exercise,nutrition, and lifestyle education opportunities.        ITP Comments:     ITP Comments    Row Name 10/26/16 1412 11/16/16 1130 12/13/16 1818 01/09/17 0716     ITP Comments Dr. Fransico Him, Medical Director  Dr. Fransico Him, Medical Director  Dr. Fransico Him, Medical Director  30 day ITP review.  pt with good participation and attendance.       Comments:

## 2017-01-10 ENCOUNTER — Other Ambulatory Visit: Payer: Self-pay

## 2017-01-10 ENCOUNTER — Ambulatory Visit (HOSPITAL_COMMUNITY)
Admission: RE | Admit: 2017-01-10 | Discharge: 2017-01-10 | Disposition: A | Discharge status: Home / Self Care | Payer: Medicare Other | Source: Ambulatory Visit

## 2017-01-10 ENCOUNTER — Encounter (HOSPITAL_COMMUNITY)
Admission: RE | Admit: 2017-01-10 | Discharge: 2017-01-10 | Disposition: A | Discharge status: Home / Self Care | Payer: Medicare Other | Source: Ambulatory Visit | Attending: Cardiovascular Disease | Admitting: Cardiovascular Disease

## 2017-01-10 ENCOUNTER — Emergency Department (HOSPITAL_COMMUNITY)
Admission: EM | Admit: 2017-01-10 | Discharge: 2017-01-10 | Disposition: A | Discharge status: Home / Self Care | Payer: Medicare Other | Attending: Emergency Medicine | Admitting: Emergency Medicine

## 2017-01-10 ENCOUNTER — Emergency Department (HOSPITAL_COMMUNITY): Payer: Medicare Other

## 2017-01-10 ENCOUNTER — Encounter (HOSPITAL_COMMUNITY): Payer: Self-pay | Admitting: Emergency Medicine

## 2017-01-10 ENCOUNTER — Telehealth: Payer: Self-pay | Admitting: Cardiology

## 2017-01-10 DIAGNOSIS — Z48812 Encounter for surgical aftercare following surgery on the circulatory system: Secondary | ICD-10-CM | POA: Diagnosis not present

## 2017-01-10 DIAGNOSIS — Z87891 Personal history of nicotine dependence: Secondary | ICD-10-CM | POA: Insufficient documentation

## 2017-01-10 DIAGNOSIS — I110 Hypertensive heart disease with heart failure: Secondary | ICD-10-CM | POA: Diagnosis not present

## 2017-01-10 DIAGNOSIS — Z7902 Long term (current) use of antithrombotics/antiplatelets: Secondary | ICD-10-CM | POA: Insufficient documentation

## 2017-01-10 DIAGNOSIS — R079 Chest pain, unspecified: Secondary | ICD-10-CM

## 2017-01-10 DIAGNOSIS — Z951 Presence of aortocoronary bypass graft: Secondary | ICD-10-CM | POA: Insufficient documentation

## 2017-01-10 DIAGNOSIS — R0789 Other chest pain: Secondary | ICD-10-CM | POA: Insufficient documentation

## 2017-01-10 DIAGNOSIS — Z794 Long term (current) use of insulin: Secondary | ICD-10-CM | POA: Insufficient documentation

## 2017-01-10 DIAGNOSIS — I2510 Atherosclerotic heart disease of native coronary artery without angina pectoris: Secondary | ICD-10-CM | POA: Insufficient documentation

## 2017-01-10 DIAGNOSIS — I509 Heart failure, unspecified: Secondary | ICD-10-CM | POA: Insufficient documentation

## 2017-01-10 DIAGNOSIS — Z79899 Other long term (current) drug therapy: Secondary | ICD-10-CM | POA: Insufficient documentation

## 2017-01-10 DIAGNOSIS — Z955 Presence of coronary angioplasty implant and graft: Secondary | ICD-10-CM

## 2017-01-10 DIAGNOSIS — E119 Type 2 diabetes mellitus without complications: Secondary | ICD-10-CM | POA: Diagnosis not present

## 2017-01-10 LAB — BASIC METABOLIC PANEL
Anion gap: 9 (ref 5–15)
BUN: 16 mg/dL (ref 6–20)
CO2: 21 mmol/L — ABNORMAL LOW (ref 22–32)
Calcium: 9.7 mg/dL (ref 8.9–10.3)
Chloride: 104 mmol/L (ref 101–111)
Creatinine, Ser: 0.83 mg/dL (ref 0.44–1.00)
GFR calc Af Amer: >60 mL/min (ref >60)
GFR calc non Af Amer: >60 mL/min (ref >60)
Glucose, Bld: 135 mg/dL — ABNORMAL HIGH (ref 65–99)
Potassium: 4.3 mmol/L (ref 3.5–5.1)
Sodium: 134 mmol/L — ABNORMAL LOW (ref 135–145)

## 2017-01-10 LAB — CBC
HCT: 41.9 % (ref 36.0–46.0)
Hemoglobin: 14.4 g/dL (ref 12.0–15.0)
MCH: 30.8 pg (ref 26.0–34.0)
MCHC: 34.4 g/dL (ref 30.0–36.0)
MCV: 89.7 fL (ref 78.0–100.0)
Platelets: 287 10*3/uL (ref 150–400)
RBC: 4.67 MIL/uL (ref 3.87–5.11)
RDW: 13 % (ref 11.5–15.5)
WBC: 10.3 10*3/uL (ref 4.0–10.5)

## 2017-01-10 LAB — GLUCOSE, CAPILLARY: Glucose-Capillary: 141 mg/dL — ABNORMAL HIGH (ref 65–99)

## 2017-01-10 LAB — I-STAT TROPONIN, ED: Troponin i, poc: 0.01 ng/mL (ref 0.00–0.08)

## 2017-01-10 LAB — TROPONIN I: Troponin I: 0.03 ng/mL (ref <0.03)

## 2017-01-10 NOTE — Progress Notes (Signed)
Pt arrived at cardiac rehab c/o 5/10 chest discomfort from walking from parking lot into cardiac rehab.  Pt reports she awoke at 1:30am with sharp jaw pain and indigestion.  These symptoms have lingered throughout the day. Pt took pepto bismol with minimal relief.  BP;  140/70, 02sat-100%, HR 85.  12 lead EKG obtained., no acute changes.   Oxygen applied -2L.  Pt unable to lie down flat, c/o smothering symptoms with lying down.  Dr. Curt Bears made aware with orders to take pt to ED for evaluation.  Pt transferred via wheelchair in stable condition. Pt notified her emergency contact. Andi Hence, RN, BSN    Cardiac Pulmonary Rehab

## 2017-01-10 NOTE — ED Triage Notes (Signed)
Pt arrives from Cardiac Rehab with complaints of chest pain that started when walking from car to building. Pain was 5/10, left sided chest pain that radiated to back. Pain was relived with rest. Pt reports waking up at 1:30 am with sharp jaw pain on the left side that lasted only a few seconds. No chest pain at this moment.

## 2017-01-10 NOTE — Discharge Instructions (Signed)
You MUST come back to the ER for any severe or worsening pain / difficulty breathing or weakness

## 2017-01-10 NOTE — Telephone Encounter (Signed)
Called by cardiac rehab as patient had several episodes of chest discomfort today prior to and at cardiac rehab. Improved with rest. EKG nonacute. SR with PVC. Pain free at the time of call. Instructed that I would arrange for follow up in the office, but if symptoms changed or returned to seek evaluation.   Reino Bellis NP

## 2017-01-10 NOTE — Progress Notes (Signed)
Cardiac Individual Treatment Plan  Patient Details  Name: Elizabeth Park MRN: [id number] Date of Birth: Aug 08, 1970 Referring Provider:     CARDIAC REHAB PHASE II ORIENTATION from 10/26/2016 in [place]  Referring Provider  Lauree Chandler MD      Initial Encounter Date:    CARDIAC REHAB PHASE II ORIENTATION from 10/26/2016 in [place]  Date  10/26/16  Referring Provider  Lauree Chandler MD      Visit Diagnosis: Status post coronary artery stent placement  Patient's Home Medications on Admission:  Current Outpatient Prescriptions:  .  amLODipine (NORVASC) 5 MG tablet, Take 5 mg by mouth 2 (two) times daily., Disp: , Rfl:  .  aspirin 81 MG tablet, Take 81 mg by mouth daily. , Disp: , Rfl:  .  carvedilol (COREG) 12.5 MG tablet, Take 12.5 mg by mouth 2 (two) times daily., Disp: , Rfl:  .  clopidogrel (PLAVIX) 75 MG tablet, Take 1 tablet (75 mg total) by mouth daily with breakfast., Disp: 90 tablet, Rfl: 4 .  empagliflozin (JARDIANCE) 10 MG TABS tablet, Take 10 mg by mouth daily., Disp: , Rfl:  .  fluticasone (FLONASE) 50 MCG/ACT nasal spray, Place 1 spray into both nostrils 2 (two) times daily. , Disp: , Rfl:  .  furosemide (LASIX) 20 MG tablet, Take one tablet by mouth daily as needed for swelling, Disp: 20 tablet, Rfl: 4 .  gabapentin (NEURONTIN) 300 MG capsule, Take 300 mg by mouth 2 (two) times daily., Disp: , Rfl:  .  insulin aspart (NOVOLOG FLEXPEN) 100 UNIT/ML injection, Inject 0-12 Units into the skin See admin instructions. Per sliding scale Under 150 no units 150-199 take 8 units 200-249 take 10 units 250-299 take 12 units, Disp: , Rfl:  .  lisinopril (PRINIVIL,ZESTRIL) 20 MG tablet, Take 20 mg by mouth daily., Disp: , Rfl:  .  montelukast (SINGULAIR) 10 MG tablet, Take 10 mg by mouth at bedtime., Disp: , Rfl:  .  Naloxone HCl (EVZIO) 0.4 MG/0.4ML SOAJ, Inject 0.4 mg as directed as needed (for possible  overdose). , Disp: , Rfl:  .  nitroGLYCERIN (NITROSTAT) 0.4 MG SL tablet, Place 1 tablet (0.4 mg total) under the tongue every 5 (five) minutes as needed for chest pain., Disp: 25 tablet, Rfl: 6 .  oxyCODONE-acetaminophen (PERCOCET) 10-325 MG per tablet, Take 1 tablet by mouth 3 (three) times daily., Disp: , Rfl:  .  rosuvastatin (CRESTOR) 10 MG tablet, Take 10 mg by mouth every evening. , Disp: , Rfl:  .  Semaglutide (OZEMPIC) 0.25 or 0.5 MG/DOSE SOPN, Inject 25 mg into the skin once a week. .25mg  x4 weeks, .5mg  x2 weeks, Disp: , Rfl:  .  SitaGLIPtin-MetFORMIN HCl (JANUMET XR) 50-1000 MG TB24, Take 1 tablet by mouth 2 (two) times daily., Disp: , Rfl:  .  TRESIBA FLEXTOUCH 100 UNIT/ML SOPN FlexTouch Pen, Inject 40 Units as directed at bedtime. , Disp: , Rfl: 4  Past Medical History: Past Medical History:  Diagnosis Date  . CHF (congestive heart failure) ([place])   . Coronary artery disease   . Diabetes mellitus   . H/O emotional problems   . Headache, migraine   . High cholesterol   . History of blood transfusion   . Hypertension   . MRSA (methicillin resistant Staphylococcus aureus)   . MVP (mitral valve prolapse)   . Neuromuscular disorder ([place])   . Neuropathy   . Sebaceous cyst     Tobacco  Use: History  Smoking Status  . Former Smoker  . Packs/day: 1.00  . Years: 28.00  . Types: Cigarettes  . Quit date: 03/31/2009  Smokeless Tobacco  . Never Used    Labs: Recent Review Flowsheet Data    Labs for ITP Cardiac and Pulmonary Rehab Latest Ref Rng & Units 04/10/2009 04/10/2009 04/26/2009 05/28/2009 09/02/2009   Cholestrol 0 - 200 mg/dL - - - - -   LDLCALC 0 - 99 mg/dL - - - - -   HDL >39 mg/dL - - - - -   Trlycerides <150 mg/dL - - - - -   Hemoglobin A1c % - - 8.0(H) - 6.5   PHART 7.350 - 7.400 - - - 7.454(H) -   PCO2ART 35.0 - 45.0 mmHg - - - 33.9(L) -   HCO3 20.0 - 24.0 mEq/L - - - 23.4 -   TCO2 0 - 100 mmol/L 42 39 - 24.5 -   ACIDBASEDEF 0.0 - 2.0 mmol/L - - - 0.0 -   O2SAT %  - - - 97.9 -      Capillary Blood Glucose: Lab Results  Component Value Date   GLUCAP 141 (H) 01/10/2017   GLUCAP 139 (H) 01/01/2017   GLUCAP 110 (H) 01/01/2017   GLUCAP 175 (H) 12/27/2016   GLUCAP 125 (H) 12/01/2016     Exercise Target Goals:    Exercise Program Goal: Individual exercise prescription set with THRR, safety & activity barriers. Participant demonstrates ability to understand and report RPE using BORG scale, to self-measure pulse accurately, and to acknowledge the importance of the exercise prescription.  Exercise Prescription Goal: Starting with aerobic activity 30 plus minutes a day, 3 days per week for initial exercise prescription. Provide home exercise prescription and guidelines that participant acknowledges understanding prior to discharge.  Activity Barriers & Risk Stratification:     Activity Barriers & Cardiac Risk Stratification - 10/26/16 1408      Activity Barriers & Cardiac Risk Stratification   Activity Barriers Back Problems   Cardiac Risk Stratification High      6 Minute Walk:     6 Minute Walk    Row Name 10/26/16 1501         6 Minute Walk   Phase Initial     Distance 1600 feet     Walk Time 6 minutes     # of Rest Breaks 0     MPH 3     METS 3.9     RPE 11     VO2 Peak 13.7     Symptoms No     Resting HR 64 bpm     Resting BP 110/72     Max Ex. HR 83 bpm     Max Ex. BP 117/69     2 Minute Post BP 117/77        Oxygen Initial Assessment:   Oxygen Re-Evaluation:   Oxygen Discharge (Final Oxygen Re-Evaluation):   Initial Exercise Prescription:     Initial Exercise Prescription - 10/26/16 1500      Date of Initial Exercise RX and Referring Provider   Date 10/26/16   Referring Provider Lauree Chandler MD     Treadmill   MPH 2.5   Grade 0   Minutes 10   METs 2.5     Bike   Level 1   Minutes 10   METs 3.1     NuStep   Level 3   SPM 80   Minutes 10  METs 2.9     Prescription Details    Frequency (times per week) 3   Duration Progress to 45 minutes of aerobic exercise without signs/symptoms of physical distress     Intensity   THRR 40-80% of Max Heartrate 69-138   Ratings of Perceived Exertion 11-13   Perceived Dyspnea 0-4     Progression   Progression Continue to progress workloads to maintain intensity without signs/symptoms of physical distress.     Resistance Training   Training Prescription Yes   Weight 2   Reps 10-15      Perform Capillary Blood Glucose checks as needed.  Exercise Prescription Changes:     Exercise Prescription Changes    Row Name 11/01/16 1700 12/11/16 1700 01/09/17 1600         Response to Exercise   Blood Pressure (Admit) 102/68 106/60 108/62     Blood Pressure (Exercise) 132/84 114/70 120/70     Blood Pressure (Exit) 104/60 102/62 89/62  recheck 91/70, pt asymptomatic, encourage hydration     Heart Rate (Admit) 65 bpm 72 bpm 83 bpm     Heart Rate (Exercise) 81 bpm 83 bpm 99 bpm     Heart Rate (Exit) 65 bpm 68 bpm 65 bpm     Rating of Perceived Exertion (Exercise) 11 11 11      Symptoms none none none     Comments pt was oriented to exercise equipment pt was oriented to exercise equipment  -     Duration Continue with 30 min of aerobic exercise without signs/symptoms of physical distress. Continue with 30 min of aerobic exercise without signs/symptoms of physical distress. Continue with 30 min of aerobic exercise without signs/symptoms of physical distress.     Intensity THRR unchanged THRR unchanged THRR unchanged       Progression   Progression Continue to progress workloads to maintain intensity without signs/symptoms of physical distress. Continue to progress workloads to maintain intensity without signs/symptoms of physical distress. Continue to progress workloads to maintain intensity without signs/symptoms of physical distress.     Average METs 2.6 2.5 3.3       Resistance Training   Training Prescription Yes Yes Yes      Weight 2lbs 2lbs 3lbs     Reps 10-15 10-15 10-15     Time 10 Minutes 10 Minutes 10 Minutes       Treadmill   MPH  -  - 2.6     Grade  -  - 1     Minutes  -  - 15     METs  -  - 3.35       Bike   Level 1 1 1.2     Minutes 15 15 15      METs 2.95 2.95 3.38       NuStep   Level 3 4  -     SPM 70 70  -     Minutes 15 15  -     METs 2.3 2.1  -       Home Exercise Plan   Plans to continue exercise at  - Home (comment)  walking on treadmill Home (comment)  walking on treadmill     Frequency  - Add 2 additional days to program exercise sessions. Add 2 additional days to program exercise sessions.     Initial Home Exercises Provided  - 11/22/16 11/22/16        Exercise Comments:     Exercise Comments  [place] Name 11/15/16 1702 12/12/16 1439 01/09/17 1642       Exercise Comments Pt was oriented to exercise equipment on 11/01/16. Pt tolerated exercise fairly well; will continue to monitor pt's progress and activity levels. Reviewed and METs goals. Pt is tolerating exercise well; will continue to monitor pt's progress. Reviewed and METs goals. Pt is tolerating exercise well; will continue to monitor pt's progress.        Exercise Goals and Review:     Exercise Goals    Row Name 10/26/16 1416 12/12/16 1439           Exercise Goals   Increase Physical Activity (P)  Yes Yes      Intervention (P)  Provide advice, education, support and counseling about physical activity/exercise needs.;Develop an individualized exercise prescription for aerobic and resistive training based on initial evaluation findings, risk stratification, comorbidities and participant's personal goals. Provide advice, education, support and counseling about physical activity/exercise needs.;Develop an individualized exercise prescription for aerobic and resistive training based on initial evaluation findings, risk stratification, comorbidities and participant's personal goals.      Expected Outcomes (P)   Achievement of increased cardiorespiratory fitness and enhanced flexibility, muscular endurance and strength shown through measurements of functional capacity and personal statement of participant. Achievement of increased cardiorespiratory fitness and enhanced flexibility, muscular endurance and strength shown through measurements of functional capacity and personal statement of participant.      Increase Strength and Stamina (P)  Yes Yes      Intervention (P)  Provide advice, education, support and counseling about physical activity/exercise needs.;Develop an individualized exercise prescription for aerobic and resistive training based on initial evaluation findings, risk stratification, comorbidities and participant's personal goals. Provide advice, education, support and counseling about physical activity/exercise needs.;Develop an individualized exercise prescription for aerobic and resistive training based on initial evaluation findings, risk stratification, comorbidities and participant's personal goals.      Expected Outcomes (P)  Achievement of increased cardiorespiratory fitness and enhanced flexibility, muscular endurance and strength shown through measurements of functional capacity and personal statement of participant. Achievement of increased cardiorespiratory fitness and enhanced flexibility, muscular endurance and strength shown through measurements of functional capacity and personal statement of participant.      Able to understand and use rate of perceived exertion (RPE) scale  - Yes      Intervention  - Provide education and explanation on how to use RPE scale      Expected Outcomes  - Short Term: Able to use RPE daily in rehab to express subjective intensity level;Long Term:  Able to use RPE to guide intensity level when exercising independently      Knowledge and understanding of Target Heart Rate Range (THRR)  - Yes      Intervention  - Provide education and explanation of THRR  including how the numbers were predicted and where they are located for reference      Expected Outcomes  - Short Term: Able to state/look up THRR;Long Term: Able to use THRR to govern intensity when exercising independently;Short Term: Able to use daily as guideline for intensity in rehab      Able to check pulse independently  - Yes      Intervention  - Provide education and demonstration on how to check pulse in carotid and radial arteries.;Review the importance of being able to check your own pulse for safety during independent exercise      Expected Outcomes  - Short Term: Able to explain why  pulse checking is important during independent exercise;Long Term: Able to check pulse independently and accurately      Understanding of Exercise Prescription  - Yes      Intervention  - Provide education, explanation, and written materials on patient's individual exercise prescription      Expected Outcomes  - Short Term: Able to explain program exercise prescription;Long Term: Able to explain home exercise prescription to exercise independently         Exercise Goals Re-Evaluation :     Exercise Goals Re-Evaluation    Row Name 11/22/16 1502 12/12/16 1439 01/09/17 1642         Exercise Goal Re-Evaluation   Exercise Goals Review Increase Physical Activity;Increase Strenth and Stamina Increase Physical Activity;Able to understand and use rate of perceived exertion (RPE) scale;Understanding of Exercise Prescription;Knowledge and understanding of Target Heart Rate Range (THRR);Increase Strength and Stamina;Able to check pulse independently Increase Physical Activity;Able to understand and use rate of perceived exertion (RPE) scale;Understanding of Exercise Prescription;Knowledge and understanding of Target Heart Rate Range (THRR);Increase Strength and Stamina;Able to check pulse independently     Comments Reviewed home exercise with pt today.  Pt plans to walk on treadmill for exercise.  Reviewed THR,  pulse, RPE, sign and symptoms, NTG use, and when to call 911 or MD.  Also discussed weather considerations and indoor options.  Pt voiced understanding. Reviewed home exercise with pt today.  Pt plans to walk on treadmill for exercise.  Reviewed THR, pulse, RPE, sign and symptoms, NTG use, and when to call 911 or MD.  Also discussed weather considerations and indoor options.  Pt voiced understanding. Pt is walking 1-2 miles on Tues./Thurs without difficulty or signs and symptoms of CP, dizziness or lightheadedness.     Expected Outcomes Pt will be compliant with HEP and improve in cardiorespiratory fitness Pt will be compliant with HEP and improve in cardiorespiratory fitness Pt will be compliant with HEP and improve in cardiorespiratory fitness         Discharge Exercise Prescription (Final Exercise Prescription Changes):     Exercise Prescription Changes - 01/09/17 1600      Response to Exercise   Blood Pressure (Admit) 108/62   Blood Pressure (Exercise) 120/70   Blood Pressure (Exit) 89/62  recheck 91/70, pt asymptomatic, encourage hydration   Heart Rate (Admit) 83 bpm   Heart Rate (Exercise) 99 bpm   Heart Rate (Exit) 65 bpm   Rating of Perceived Exertion (Exercise) 11   Symptoms none   Duration Continue with 30 min of aerobic exercise without signs/symptoms of physical distress.   Intensity THRR unchanged     Progression   Progression Continue to progress workloads to maintain intensity without signs/symptoms of physical distress.   Average METs 3.3     Resistance Training   Training Prescription Yes   Weight 3lbs   Reps 10-15   Time 10 Minutes     Treadmill   MPH 2.6   Grade 1   Minutes 15   METs 3.35     Bike   Level 1.2   Minutes 15   METs 3.38     Home Exercise Plan   Plans to continue exercise at Home (comment)  walking on treadmill   Frequency Add 2 additional days to program exercise sessions.   Initial Home Exercises Provided 11/22/16      Nutrition:   Target Goals: Understanding of nutrition guidelines, daily intake of sodium 1500mg , cholesterol 200mg , calories 30% from fat and  7% or less from saturated fats, daily to have 5 or more servings of fruits and vegetables.  Biometrics:     Pre Biometrics - 10/26/16 1514      Pre Biometrics   Waist Circumference 44.5 inches   Hip Circumference 47.5 inches   Waist to Hip Ratio 0.94 %   Triceps Skinfold 15 mm   % Body Fat 41.9 %   Grip Strength 40 kg   Flexibility 8.5 in   Single Leg Stand 28.8 seconds       Nutrition Therapy Plan and Nutrition Goals:     Nutrition Therapy & Goals - 11/01/16 1455      Nutrition Therapy   Diet Carb Modified, Therapeutic Lifestyle Changes     Personal Nutrition Goals   Nutrition Goal Pt to identify food quantities necessary to achieve weight loss of 6-24 lb (2.7-10.9 kg) at graduation from cardiac rehab. Goal wt of 175 lb desired.      Intervention Plan   Intervention Prescribe, educate and counsel regarding individualized specific dietary modifications aiming towards targeted core components such as weight, hypertension, lipid management, diabetes, heart failure and other comorbidities.   Expected Outcomes Short Term Goal: Understand basic principles of dietary content, such as calories, fat, sodium, cholesterol and nutrients.;Long Term Goal: Adherence to prescribed nutrition plan.      Nutrition Discharge: Nutrition Scores:     Nutrition Assessments - 11/01/16 1455      MEDFICTS Scores   Pre Score 9      Nutrition Goals Re-Evaluation:   Nutrition Goals Re-Evaluation:   Nutrition Goals Discharge (Final Nutrition Goals Re-Evaluation):   Psychosocial: Target Goals: Acknowledge presence or absence of significant depression and/or stress, maximize coping skills, provide positive support system. Participant is able to verbalize types and ability to use techniques and skills needed for reducing stress and depression.  Initial Review  & Psychosocial Screening:     Initial Psych Review & Screening - 10/26/16 1538      Initial Review   Current issues with Current Stress Concerns   Source of Stress Concerns Family   Comments Patient rated "low" - family son who is in the [employer] is leaving soon for [place]     Family Dynamics   Good Support System? Yes  pt reports significant other and two sons.     Barriers   Psychosocial barriers to participate in program The patient should benefit from training in stress management and relaxation.      Quality of Life Scores:     Quality of Life - 11/24/16 1535      Quality of Life Scores   Health/Function Pre --  pt reports decreased overall symptoms c/o increased anxiety related dyspnea.  pt also concerned about diabetes medications. discussed with Derek Mound, RD, CDE who will schedule to meet with pt to discuss diabetic regimen.    Socioeconomic Pre --  pt interested in vocational rehab. pt is also pursuing disability   GLOBAL Pre --  pt offered emotional support and reassurance.  Dr. Criss Rosales manages diabetes, next f/u 12/27/2016.  pt instructed to discuss lipid questions with Dr. Angelena Form      PHQ-9: Recent Review Flowsheet Data    Depression screen [facility] 2/9 11/01/2016 08/09/2016 09/13/2015   Decreased Interest 0 0 0   Down, Depressed, Hopeless 0 0 0   PHQ - 2 Score 0 0 0     Interpretation of Total Score  Total Score Depression Severity:  1-4 = Minimal depression, 5-9 =  Mild depression, 10-14 = Moderate depression, 15-19 = Moderately severe depression, 20-27 = Severe depression   Psychosocial Evaluation and Intervention:     Psychosocial Evaluation - 11/01/16 1639      Psychosocial Evaluation & Interventions   Interventions Encouraged to exercise with the program and follow exercise prescription;Stress management education;Relaxation education   Comments no psychosocial needs identified, no interventions necessary    Expected Outcomes pt will exhibit positive  outlook with good coping skills    Continue Psychosocial Services  No Follow up required      Psychosocial Re-Evaluation:     Psychosocial Re-Evaluation    [place] Name 11/16/16 1133 12/13/16 1818 01/09/17 0717         Psychosocial Re-Evaluation   Current issues with None Identified None Identified None Identified     Comments no psychosocial needs identified, no interventions necessary.  no psychosocial needs identified, no interventions necessary.  no psychosocial needs identified, no interventions necessary.      Expected Outcomes pt will exhibit positive outlook with good coping skills.  pt will exhibit positive outlook with good coping skills.  pt will exhibit positive outlook with good coping skills.      Interventions Stress management education;Relaxation education;Encouraged to attend Cardiac Rehabilitation for the exercise Stress management education;Relaxation education;Encouraged to attend Cardiac Rehabilitation for the exercise Stress management education;Relaxation education;Encouraged to attend Cardiac Rehabilitation for the exercise     Continue Psychosocial Services  No Follow up required No Follow up required No Follow up required        Psychosocial Discharge (Final Psychosocial Re-Evaluation):     Psychosocial Re-Evaluation - 01/09/17 0717      Psychosocial Re-Evaluation   Current issues with None Identified   Comments no psychosocial needs identified, no interventions necessary.    Expected Outcomes pt will exhibit positive outlook with good coping skills.    Interventions Stress management education;Relaxation education;Encouraged to attend Cardiac Rehabilitation for the exercise   Continue Psychosocial Services  No Follow up required      Vocational Rehabilitation: Provide vocational rehab assistance to qualifying candidates.   Vocational Rehab Evaluation & Intervention:     Vocational Rehab - 11/22/16 1404      Initial Vocational Rehab Evaluation &  Intervention   Assessment shows need for Vocational Rehabilitation Yes   Vocational Rehab Packet given to patient 11/22/16      Education: Education Goals: Education classes will be provided on a weekly basis, covering required topics. Participant will state understanding/return demonstration of topics presented.  Learning Barriers/Preferences:     Learning Barriers/Preferences - 10/26/16 1407      Learning Barriers/Preferences   Learning Barriers None   Learning Preferences Skilled Demonstration      Education Topics: Count Your Pulse:  -Group instruction provided by verbal instruction, demonstration, patient participation and written materials to support subject.  Instructors address importance of being able to find your pulse and how to count your pulse when at home without a heart monitor.  Patients get hands on experience counting their pulse with staff help and individually.   Heart Attack, Angina, and Risk Factor Modification:  -Group instruction provided by verbal instruction, video, and written materials to support subject.  Instructors address signs and symptoms of angina and heart attacks.    Also discuss risk factors for heart disease and how to make changes to improve heart health risk factors.   Functional Fitness:  -Group instruction provided by verbal instruction, demonstration, patient participation, and written materials to support subject.  Instructors address safety measures for doing things around the house.  Discuss how to get up and down off the floor, how to pick things up properly, how to safely get out of a chair without assistance, and balance training.   Meditation and Mindfulness:  -Group instruction provided by verbal instruction, patient participation, and written materials to support subject.  Instructor addresses importance of mindfulness and meditation practice to help reduce stress and improve awareness.  Instructor also leads participants through  a meditation exercise.    Stretching for Flexibility and Mobility:  -Group instruction provided by verbal instruction, patient participation, and written materials to support subject.  Instructors lead participants through series of stretches that are designed to increase flexibility thus improving mobility.  These stretches are additional exercise for major muscle groups that are typically performed during regular warm up and cool down.   Hands Only CPR:  -Group verbal, video, and participation provides a basic overview of AHA guidelines for community CPR. Role-play of emergencies allow participants the opportunity to practice calling for help and chest compression technique with discussion of AED use.   Hypertension: -Group verbal and written instruction that provides a basic overview of hypertension including the most recent diagnostic guidelines, risk factor reduction with self-care instructions and medication management.    Nutrition I class: Heart Healthy Eating:  -Group instruction provided by PowerPoint slides, verbal discussion, and written materials to support subject matter. The instructor gives an explanation and review of the Therapeutic Lifestyle Changes diet recommendations, which includes a discussion on lipid goals, dietary fat, sodium, fiber, plant stanol/sterol esters, sugar, and the components of a well-balanced, healthy diet.   Nutrition II class: Lifestyle Skills:  -Group instruction provided by PowerPoint slides, verbal discussion, and written materials to support subject matter. The instructor gives an explanation and review of label reading, grocery shopping for heart health, heart healthy recipe modifications, and ways to make healthier choices when eating out.   Diabetes Question & Answer:  -Group instruction provided by PowerPoint slides, verbal discussion, and written materials to support subject matter. The instructor gives an explanation and review of diabetes  co-morbidities, pre- and post-prandial blood glucose goals, pre-exercise blood glucose goals, signs, symptoms, and treatment of hypoglycemia and hyperglycemia, and foot care basics.   Diabetes Blitz:  -Group instruction provided by PowerPoint slides, verbal discussion, and written materials to support subject matter. The instructor gives an explanation and review of the physiology behind type 1 and type 2 diabetes, diabetes medications and rational behind using different medications, pre- and post-prandial blood glucose recommendations and Hemoglobin A1c goals, diabetes diet, and exercise including blood glucose guidelines for exercising safely.    Portion Distortion:  -Group instruction provided by PowerPoint slides, verbal discussion, written materials, and food models to support subject matter. The instructor gives an explanation of serving size versus portion size, changes in portions sizes over the last 20 years, and what consists of a serving from each food group.   Stress Management:  -Group instruction provided by verbal instruction, video, and written materials to support subject matter.  Instructors review role of stress in heart disease and how to cope with stress positively.     Exercising on Your Own:  -Group instruction provided by verbal instruction, power point, and written materials to support subject.  Instructors discuss benefits of exercise, components of exercise, frequency and intensity of exercise, and end points for exercise.  Also discuss use of nitroglycerin and activating EMS.  Review options of places to exercise outside  of rehab.  Review guidelines for sex with heart disease.   CARDIAC REHAB PHASE II EXERCISE from 12/06/2016 in [place]  Date  12/06/16  Educator  EP  Instruction Review Code  2- meets goals/outcomes      Cardiac Drugs I:  -Group instruction provided by verbal instruction and written materials to support subject.   Instructor reviews cardiac drug classes: antiplatelets, anticoagulants, beta blockers, and statins.  Instructor discusses reasons, side effects, and lifestyle considerations for each drug class.   Cardiac Drugs II:  -Group instruction provided by verbal instruction and written materials to support subject.  Instructor reviews cardiac drug classes: angiotensin converting enzyme inhibitors (ACE-I), angiotensin II receptor blockers (ARBs), nitrates, and calcium channel blockers.  Instructor discusses reasons, side effects, and lifestyle considerations for each drug class.   Anatomy and Physiology of the Circulatory System:  Group verbal and written instruction and models provide basic cardiac anatomy and physiology, with the coronary electrical and arterial systems. Review of: AMI, Angina, Valve disease, Heart Failure, Peripheral Artery Disease, Cardiac Arrhythmia, Pacemakers, and the ICD.   CARDIAC REHAB PHASE II EXERCISE from 12/06/2016 in [place]  Date  11/01/16  Instruction Review Code  2- meets goals/outcomes      Other Education:  -Group or individual verbal, written, or video instructions that support the educational goals of the cardiac rehab program.   Knowledge Questionnaire Score:     Knowledge Questionnaire Score - 10/26/16 1428      Knowledge Questionnaire Score   Pre Score 21/24      Core Components/Risk Factors/Patient Goals at Admission:     Personal Goals and Risk Factors at Admission - 11/01/16 1636      Core Components/Risk Factors/Patient Goals on Admission    Weight Management Obesity;Yes   Intervention Weight Management: Develop a combined nutrition and exercise program designed to reach desired caloric intake, while maintaining appropriate intake of nutrient and fiber, sodium and fats, and appropriate energy expenditure required for the weight goal.;Weight Management: Provide education and appropriate resources to help  participant work on and attain dietary goals.;Weight Management/Obesity: Establish reasonable short term and long term weight goals.;Obesity: Provide education and appropriate resources to help participant work on and attain dietary goals.   Admit Weight 210 lb 15.7 oz (95.7 kg)   Expected Outcomes Short Term: Continue to assess and modify interventions until short term weight is achieved;Long Term: Adherence to nutrition and physical activity/exercise program aimed toward attainment of established weight goal;Weight Loss: Understanding of general recommendations for a balanced deficit meal plan, which promotes 1-2 lb weight loss per week and includes a negative energy balance of [phone] kcal/d;Understanding recommendations for meals to include 15-35% energy as protein, 25-35% energy from fat, 35-60% energy from carbohydrates, less than 200mg  of dietary cholesterol, 20-35 gm of total fiber daily   Diabetes Yes   Intervention Provide education about signs/symptoms and action to take for hypo/hyperglycemia.;Provide education about proper nutrition, including hydration, and aerobic/resistive exercise prescription along with prescribed medications to achieve blood glucose in normal ranges: Fasting glucose 65-99 mg/dL   Expected Outcomes Short Term: Participant verbalizes understanding of the signs/symptoms and immediate care of hyper/hypoglycemia, proper foot care and importance of medication, aerobic/resistive exercise and nutrition plan for blood glucose control.;Long Term: Attainment of HbA1C < 7%.   Hypertension Yes   Intervention Provide education on lifestyle modifcations including regular physical activity/exercise, weight management, moderate sodium restriction and increased consumption of fresh fruit, vegetables, and  low fat dairy, alcohol moderation, and smoking cessation.;Monitor prescription use compliance.   Expected Outcomes Short Term: Continued assessment and intervention until BP is < 140/44mm  HG in hypertensive participants. < 130/77mm HG in hypertensive participants with diabetes, heart failure or chronic kidney disease.;Long Term: Maintenance of blood pressure at goal levels.   Lipids Yes   Intervention Provide education and support for participant on nutrition & aerobic/resistive exercise along with prescribed medications to achieve LDL 70mg , HDL >40mg .   Expected Outcomes Short Term: Participant states understanding of desired cholesterol values and is compliant with medications prescribed. Participant is following exercise prescription and nutrition guidelines.;Long Term: Cholesterol controlled with medications as prescribed, with individualized exercise RX and with personalized nutrition plan. Value goals: LDL < 70mg , HDL > 40 mg.      Core Components/Risk Factors/Patient Goals Review:      Goals and Risk Factor Review    Row Name 11/16/16 1131 12/13/16 1818 01/09/17 0716         Core Components/Risk Factors/Patient Goals Review   Personal Goals Review Diabetes;Hypertension;Lipids Diabetes;Hypertension;Lipids Diabetes;Hypertension;Lipids     Review pt with multiple CAD RF displays eagerness to participate in CR exercise, nutrition and lifestyle education. pt with multiple CAD RF displays eagerness to participate in CR exercise, nutrition and lifestyle education. pt with multiple CAD RF displays eagerness to participate in CR exercise, nutrition and lifestyle education. pt blood sugar normalizing with current treatment.  pt BP WNL     Expected Outcomes pt will participate in CR exercise,nutrition, and lifestyle education opportunities.   pt will participate in CR exercise,nutrition, and lifestyle education opportunities.   pt will participate in CR exercise,nutrition, and lifestyle education opportunities.          Core Components/Risk Factors/Patient Goals at Discharge (Final Review):      Goals and Risk Factor Review - 01/09/17 0716      Core Components/Risk  Factors/Patient Goals Review   Personal Goals Review Diabetes;Hypertension;Lipids   Review pt with multiple CAD RF displays eagerness to participate in CR exercise, nutrition and lifestyle education. pt blood sugar normalizing with current treatment.  pt BP WNL   Expected Outcomes pt will participate in CR exercise,nutrition, and lifestyle education opportunities.        ITP Comments:     ITP Comments    Row Name 10/26/16 1412 11/16/16 1130 12/13/16 1818 01/09/17 0716     ITP Comments Dr. Fransico Him, Medical Director  Dr. Fransico Him, Medical Director  Dr. Fransico Him, Medical Director  30 day ITP review.  pt with good participation and attendance.       Comments:

## 2017-01-10 NOTE — ED Provider Notes (Signed)
[facility] DEPT Provider Note   CSN: [id number] Arrival date & time: 01/10/17  1405     History   Chief Complaint Chief Complaint  Patient presents with  . Chest Pain    HPI Elizabeth Park is a 47 y.o. female.  HPI  He patient is a 46 year old female, she has a known history of coronary disease and diabetes status post coronary bypass grafting in 2011 as well as stenting which was done in June 2018 when she had 2 additional stents because of obstructive disease. She reports that she has done extremely well and has been attending her cardiac rehabilitation, taking her Plavix and aspirin. Last night she had a salad with lots of cucumber, she felt like she was belching excessively last night and into today and today had a sharp chest pain just prior to going to cardiac rehabilitation. They directed her to the emergency department for further evaluation. She states that she is totally symptom-free at this time and that her symptoms did improve with Pepto-Bismol when she took them last night. She had no radiation to her back or her shoulders though she did feel a strange pain in her jaw. She no longer has that pain which went away more than 12 hours ago. She actually has no symptoms whatsoever at this time. She denies swelling of the legs, cough, fever or shortness of breath.  Past Medical History:  Diagnosis Date  . CHF (congestive heart failure) ([place])   . Coronary artery disease   . Diabetes mellitus   . H/O emotional problems   . Headache, migraine   . High cholesterol   . History of blood transfusion   . Hypertension   . MRSA (methicillin resistant Staphylococcus aureus)   . MVP (mitral valve prolapse)   . Neuromuscular disorder ([place])   . Neuropathy   . Sebaceous cyst     Patient Active Problem List   Diagnosis Date Noted  . Unstable angina ([place])   . Follow-up examination, following unspecified surgery 01/29/2012  . S/P mitral valve repair 11/05/2011  . TOBACCO USE, QUIT  10/15/2009  . PERIPHERAL NEUROPATHY, FEET 09/02/2009  . CAD, ARTERY BYPASS GRAFT 05/11/2009  . MITRAL REGURGITATION 04/28/2009  . Hyperlipidemia 04/26/2009  . Essential hypertension 04/26/2009  . SEBACEOUS CYST 10/09/2008  . Type 2 diabetes mellitus with complication ([place]) 42/70/6237  . TOBACCO ABUSE 04/10/2008  . FIBROCYSTIC BREAST DISEASE 04/10/2008    Past Surgical History:  Procedure Laterality Date  . ABDOMINAL HYSTERECTOMY    . CARDIAC CATHETERIZATION  09/22/2016  . CORONARY ARTERY BYPASS GRAFT  with valve repair  . CORONARY STENT INTERVENTION N/A 09/22/2016   Procedure: Coronary Stent Intervention;  Surgeon: Burnell Blanks, MD;  Location: [facility];  Service: Cardiovascular;  Laterality: N/A;  . CORONARY STENT PLACEMENT  09/22/2016  . LEFT HEART CATH AND CORS/GRAFTS ANGIOGRAPHY N/A 09/22/2016   Procedure: Left Heart Cath and Cors/Grafts Angiography;  Surgeon: Burnell Blanks, MD;  Location: [facility];  Service: Cardiovascular;  Laterality: N/A;  . SKIN GRAFT      OB History    Gravida Para Term Preterm AB Living   2 2 2     2    SAB TAB Ectopic Multiple Live Births                   Home Medications    Prior to Admission medications   Medication Sig Start Date End Date Taking? Authorizing Provider  amLODipine (NORVASC) 5  MG tablet Take 5 mg by mouth 2 (two) times daily.   Yes [provider]  aspirin 81 MG tablet Take 81 mg by mouth daily.  11/02/11  Yes Hillary Bow, MD  carvedilol (COREG) 12.5 MG tablet Take 12.5 mg by mouth 2 (two) times daily. 12/01/15  Yes [provider]  clopidogrel (PLAVIX) 75 MG tablet Take 1 tablet (75 mg total) by mouth daily with breakfast. 09/24/16  Yes Eileen Stanford, PA-C  fluticasone (FLONASE) 50 MCG/ACT nasal spray Place 1 spray into both nostrils 2 (two) times daily.    Yes [provider]  furosemide (LASIX) 20 MG tablet Take one tablet by mouth daily as needed for  swelling 10/13/16  Yes Burnell Blanks, MD  gabapentin (NEURONTIN) 300 MG capsule Take 300 mg by mouth 2 (two) times daily.   Yes [provider]  insulin aspart (NOVOLOG FLEXPEN) 100 UNIT/ML injection Inject 0-12 Units into the skin See admin instructions. Per sliding scale Under 150 no units 150-199 take 8 units 200-249 take 10 units 250-299 take 12 units   Yes [provider]  lisinopril (PRINIVIL,ZESTRIL) 20 MG tablet Take 20 mg by mouth daily.   Yes [provider]  montelukast (SINGULAIR) 10 MG tablet Take 10 mg by mouth at bedtime.   Yes [provider]  Naloxone HCl (EVZIO) 0.4 MG/0.4ML SOAJ Inject 0.4 mg as directed as needed (for possible overdose).    Yes [provider]  nitroGLYCERIN (NITROSTAT) 0.4 MG SL tablet Place 1 tablet (0.4 mg total) under the tongue every 5 (five) minutes as needed for chest pain. 10/27/16 01/25/17 Yes Burnell Blanks, MD  oxyCODONE-acetaminophen (PERCOCET) 10-325 MG per tablet Take 1 tablet by mouth 3 (three) times daily.   Yes [provider]  rosuvastatin (CRESTOR) 10 MG tablet Take 10 mg by mouth every evening.  12/05/15  Yes [provider]  Semaglutide (OZEMPIC) 0.25 or 0.5 MG/DOSE SOPN Inject 25 mg into the skin once a week. .25mg  x4 weeks, .5mg  x2 weeks   Yes [provider]  SitaGLIPtin-MetFORMIN HCl (JANUMET XR) 50-1000 MG TB24 Take 1 tablet by mouth 2 (two) times daily.   Yes [provider]  TRESIBA FLEXTOUCH 100 UNIT/ML SOPN FlexTouch Pen Inject 40 Units as directed at bedtime.  08/13/16  Yes [provider]    Family History Family History  Problem Relation Age of Onset  . Heart disease Father   . Asthma Father   . Hypertension Father   . Diabetes Father   . Arthritis Father   . Heart disease Mother   . Hypertension Mother   . Diabetes Mother   . Migraines Mother   . Stroke Mother   . Emphysema Maternal Grandmother   . Hypertension  Brother   . Diabetes Brother     Social History Social History  Substance Use Topics  . Smoking status: Former Smoker    Packs/day: 1.00    Years: 28.00    Types: Cigarettes    Quit date: 03/31/2009  . Smokeless tobacco: Never Used  . Alcohol use No     Allergies   Metformin   Review of Systems Review of Systems  All other systems reviewed and are negative.    Physical Exam Updated Vital Signs BP 119/77   Pulse 75   Temp 97.6 F (36.4 C) (Oral)   Resp 20   Ht 5\' 4"  (1.626 m)   Wt 94.8 kg (209 lb)   SpO2 99%  BMI 35.87 kg/m   Physical Exam  Constitutional: She appears well-developed and well-nourished. No distress.  HENT:  Head: Normocephalic and atraumatic.  Mouth/Throat: Oropharynx is clear and moist. No oropharyngeal exudate.  Eyes: Pupils are equal, round, and reactive to light. Conjunctivae and EOM are normal. Right eye exhibits no discharge. Left eye exhibits no discharge. No scleral icterus.  Neck: Normal range of motion. Neck supple. No JVD present. No thyromegaly present.  Cardiovascular: Normal rate, regular rhythm, normal heart sounds and intact distal pulses.  Exam reveals no gallop and no friction rub.   No murmur heard. Pulmonary/Chest: Effort normal and breath sounds normal. No respiratory distress. She has no wheezes. She has no rales.  Abdominal: Soft. Bowel sounds are normal. She exhibits no distension and no mass. There is no tenderness.  Musculoskeletal: Normal range of motion. She exhibits no edema or tenderness.  Lymphadenopathy:    She has no cervical adenopathy.  Neurological: She is alert. Coordination normal.  Skin: Skin is warm and dry. No rash noted. No erythema.  Psychiatric: She has a normal mood and affect. Her behavior is normal.  Nursing note and vitals reviewed.    ED Treatments / Results  Labs (all labs ordered are listed, but only abnormal results are displayed) Labs Reviewed  BASIC METABOLIC PANEL - Abnormal;  Notable for the following:       Result Value   Sodium 134 (*)    CO2 21 (*)    Glucose, Bld 135 (*)    All other components within normal limits  TROPONIN I - Abnormal; Notable for the following:    Troponin I 0.03 (*)    All other components within normal limits  CBC  I-STAT TROPONIN, ED    EKG  EKG Interpretation  Date/Time:  Wednesday January 10 2017 14:05:09 EDT Ventricular Rate:  71 PR Interval:  164 QRS Duration: 76 QT Interval:  370 QTC Calculation: 402 R Axis:   -35 Text Interpretation:  Normal sinus rhythm with sinus arrhythmia Left axis deviation Septal infarct , age undetermined Abnormal ECG since last tracing no significant change Confirmed by Noemi Chapel [phone]) on 01/10/2017 6:50:41 PM       Radiology Dg Chest 2 View  Result Date: 01/10/2017 CLINICAL DATA:  Chest discomfort while walking into the cardiac rehab section from the parking lot. Earlier today the patient was awakened with sharp jaw pain and indigestion which have lingered. EXAM: CHEST  2 VIEW COMPARISON:  Chest x-ray of April 06, 2016 FINDINGS: The lungs are adequately inflated and clear. The heart and pulmonary vascularity are normal. The patient has undergone previous CABG and mitral valve replacement. The mediastinum is normal in width. The bony thorax exhibits no acute abnormality. IMPRESSION: There is no CHF nor other acute cardiopulmonary abnormality. Previous CABG and mitral valve replacement. Electronically Signed   By: David  Martinique M.D.   On: 01/10/2017 15:45    Procedures Procedures (including critical care time)  Medications Ordered in ED Medications - No data to display   Initial Impression / Assessment and Plan / ED Course  I have reviewed the triage vital signs and the nursing notes.  Pertinent labs & imaging results that were available during my care of the patient were reviewed by me and considered in my medical decision making (see chart for details).    Initial troponin  is negative, EKG is unchanged, chest x-ray shows no acute findings either.  The patient will need to undergo a second troponin test, she  is symptom-free, she reports that she is feeling back to her baseline and has no symptoms whatsoever. I see no evidence of ischemia on the EKG and if his second troponin is negative she should be able to go home.  The pt has had normal labs - last trop was 0.03, the pt has been CP free since last night. And has had no changes on EKG  I do not feel that the pt need further evaluation or inpatient management of her symptoms expecially since she has been symptom from for the last 12 + hours.  Pt is in agreement and will return should her symptoms worsen.  She is aware that she could have recurrent blockages in her arteries but does not want to stay in hospital - she will return should symptoms worsen.   Final Clinical Impressions(s) / ED Diagnoses   Final diagnoses:  Left sided chest pain    New Prescriptions New Prescriptions   No medications on file     Noemi Chapel, MD 01/10/17 2128

## 2017-01-12 ENCOUNTER — Encounter (HOSPITAL_COMMUNITY): Admission: RE | Admit: 2017-01-12 | Payer: Medicare Other | Source: Ambulatory Visit

## 2017-01-14 ENCOUNTER — Other Ambulatory Visit: Payer: Self-pay | Admitting: Cardiovascular Disease

## 2017-01-15 ENCOUNTER — Encounter (HOSPITAL_COMMUNITY): Admission: RE | Admit: 2017-01-15 | Payer: Medicare Other | Source: Ambulatory Visit

## 2017-01-15 DIAGNOSIS — E119 Type 2 diabetes mellitus without complications: Secondary | ICD-10-CM | POA: Diagnosis not present

## 2017-01-15 DIAGNOSIS — H04123 Dry eye syndrome of bilateral lacrimal glands: Secondary | ICD-10-CM | POA: Diagnosis not present

## 2017-01-17 ENCOUNTER — Encounter (HOSPITAL_COMMUNITY): Payer: Medicare Other

## 2017-01-17 ENCOUNTER — Telehealth: Payer: Self-pay | Admitting: Cardiovascular Disease

## 2017-01-17 NOTE — Telephone Encounter (Signed)
Will ask Dr Angelena Form  If pt may proceed with cardiac rehab [facility]

## 2017-01-17 NOTE — Telephone Encounter (Signed)
She can restart cardiac rehab if she is feeling well. Thanks, chris

## 2017-01-17 NOTE — Telephone Encounter (Signed)
Pt aware and this notes was faxed to cardiac rehab [facility]

## 2017-01-17 NOTE — Telephone Encounter (Signed)
New message     Patient requesting a release to resume cardiac rehab on 10/19. Please call

## 2017-01-18 DIAGNOSIS — Z79891 Long term (current) use of opiate analgesic: Secondary | ICD-10-CM | POA: Diagnosis not present

## 2017-01-18 DIAGNOSIS — M79604 Pain in right leg: Secondary | ICD-10-CM | POA: Diagnosis not present

## 2017-01-18 DIAGNOSIS — M79661 Pain in right lower leg: Secondary | ICD-10-CM | POA: Diagnosis not present

## 2017-01-18 DIAGNOSIS — M545 Low back pain: Secondary | ICD-10-CM | POA: Diagnosis not present

## 2017-01-18 DIAGNOSIS — M79662 Pain in left lower leg: Secondary | ICD-10-CM | POA: Diagnosis not present

## 2017-01-18 DIAGNOSIS — M79605 Pain in left leg: Secondary | ICD-10-CM | POA: Diagnosis not present

## 2017-01-18 DIAGNOSIS — G894 Chronic pain syndrome: Secondary | ICD-10-CM | POA: Diagnosis not present

## 2017-01-19 ENCOUNTER — Encounter (HOSPITAL_COMMUNITY): Payer: Medicare Other

## 2017-01-22 ENCOUNTER — Encounter (HOSPITAL_COMMUNITY): Payer: Medicare Other

## 2017-01-24 ENCOUNTER — Encounter (HOSPITAL_COMMUNITY)
Admission: RE | Admit: 2017-01-24 | Discharge: 2017-01-24 | Disposition: A | Discharge status: Home / Self Care | Payer: Medicare Other | Source: Ambulatory Visit | Attending: Cardiovascular Disease | Admitting: Cardiovascular Disease

## 2017-01-24 ENCOUNTER — Ambulatory Visit: Payer: Medicare Other | Admitting: Podiatry

## 2017-01-24 DIAGNOSIS — Z955 Presence of coronary angioplasty implant and graft: Secondary | ICD-10-CM | POA: Diagnosis not present

## 2017-01-24 DIAGNOSIS — Z48812 Encounter for surgical aftercare following surgery on the circulatory system: Secondary | ICD-10-CM | POA: Diagnosis not present

## 2017-01-26 ENCOUNTER — Encounter (HOSPITAL_COMMUNITY)
Admission: RE | Admit: 2017-01-26 | Discharge: 2017-01-26 | Disposition: A | Discharge status: Home / Self Care | Payer: Medicare Other | Source: Ambulatory Visit | Attending: Cardiovascular Disease | Admitting: Cardiovascular Disease

## 2017-01-26 DIAGNOSIS — Z955 Presence of coronary angioplasty implant and graft: Secondary | ICD-10-CM

## 2017-01-26 DIAGNOSIS — Z48812 Encounter for surgical aftercare following surgery on the circulatory system: Secondary | ICD-10-CM | POA: Diagnosis not present

## 2017-01-29 ENCOUNTER — Encounter: Payer: Self-pay | Admitting: Podiatry

## 2017-01-29 ENCOUNTER — Telehealth: Payer: Self-pay | Admitting: *Deleted

## 2017-01-29 ENCOUNTER — Encounter (HOSPITAL_COMMUNITY)
Admission: RE | Admit: 2017-01-29 | Discharge: 2017-01-29 | Disposition: A | Discharge status: Home / Self Care | Payer: Medicare Other | Source: Ambulatory Visit | Attending: Cardiovascular Disease | Admitting: Cardiovascular Disease

## 2017-01-29 ENCOUNTER — Ambulatory Visit (INDEPENDENT_AMBULATORY_CARE_PROVIDER_SITE_OTHER): Payer: Medicare Other | Admitting: Podiatry

## 2017-01-29 VITALS — BP 103/60 | HR 76

## 2017-01-29 DIAGNOSIS — R0989 Other specified symptoms and signs involving the circulatory and respiratory systems: Secondary | ICD-10-CM

## 2017-01-29 DIAGNOSIS — I200 Unstable angina: Secondary | ICD-10-CM | POA: Diagnosis not present

## 2017-01-29 DIAGNOSIS — Z955 Presence of coronary angioplasty implant and graft: Secondary | ICD-10-CM | POA: Diagnosis not present

## 2017-01-29 DIAGNOSIS — Z48812 Encounter for surgical aftercare following surgery on the circulatory system: Secondary | ICD-10-CM | POA: Diagnosis not present

## 2017-01-29 DIAGNOSIS — E1142 Type 2 diabetes mellitus with diabetic polyneuropathy: Secondary | ICD-10-CM

## 2017-01-29 DIAGNOSIS — E1342 Other specified diabetes mellitus with diabetic polyneuropathy: Secondary | ICD-10-CM

## 2017-01-29 NOTE — Telephone Encounter (Addendum)
-----   Message from Gean Birchwood, DPM sent at 01/29/2017  9:01 AM EDT ----- Please schedule a lower extremity arterial Doppler for the indication of diabetic with absent pedal pulses and contact patient after scheduling. [place], "THIS [place] MEDICAID MEMBER DOES NOT REQUIRE PRIOR AUTHORIZATION FOR OUTPATIENT RADIOLOGY THROUGH [facility] OR [place] [facility] AT THIS TIME." Faxed orders to [facility]. Left message to contact [phone] for appt if they had not called prior to Tuesday afternoon.

## 2017-01-29 NOTE — Patient Instructions (Signed)
The vascular lab we'll contact you to schedule a lower extremity arterial Doppler . This is a noninvasive test and will check circulation in your legs and feet   Diabetes and Foot Care Diabetes may cause you to have problems because of poor blood supply (circulation) to your feet and legs. This may cause the skin on your feet to become thinner, break easier, and heal more slowly. Your skin may become dry, and the skin may peel and crack. You may also have nerve damage in your legs and feet causing decreased feeling in them. You may not notice minor injuries to your feet that could lead to infections or more serious problems. Taking care of your feet is one of the most important things you can do for yourself. Follow these instructions at home:  Wear shoes at all times, even in the house. Do not go barefoot. Bare feet are easily injured.  Check your feet daily for blisters, cuts, and redness. If you cannot see the bottom of your feet, use a mirror or ask someone for help.  Wash your feet with warm water (do not use hot water) and mild soap. Then pat your feet and the areas between your toes until they are completely dry. Do not soak your feet as this can dry your skin.  Apply a moisturizing lotion or petroleum jelly (that does not contain alcohol and is unscented) to the skin on your feet and to dry, brittle toenails. Do not apply lotion between your toes.  Trim your toenails straight across. Do not dig under them or around the cuticle. File the edges of your nails with an emery board or nail file.  Do not cut corns or calluses or try to remove them with medicine.  Wear clean socks or stockings every day. Make sure they are not too tight. Do not wear knee-high stockings since they may decrease blood flow to your legs.  Wear shoes that fit properly and have enough cushioning. To break in new shoes, wear them for just a few hours a day. This prevents you from injuring your feet. Always look in your  shoes before you put them on to be sure there are no objects inside.  Do not cross your legs. This may decrease the blood flow to your feet.  If you find a minor scrape, cut, or break in the skin on your feet, keep it and the skin around it clean and dry. These areas may be cleansed with mild soap and water. Do not cleanse the area with peroxide, alcohol, or iodine.  When you remove an adhesive bandage, be sure not to damage the skin around it.  If you have a wound, look at it several times a day to make sure it is healing.  Do not use heating pads or hot water bottles. They may burn your skin. If you have lost feeling in your feet or legs, you may not know it is happening until it is too late.  Make sure your health care provider performs a complete foot exam at least annually or more often if you have foot problems. Report any cuts, sores, or bruises to your health care provider immediately. Contact a health care provider if:  You have an injury that is not healing.  You have cuts or breaks in the skin.  You have an ingrown nail.  You notice redness on your legs or feet.  You feel burning or tingling in your legs or feet.  You have  pain or cramps in your legs and feet.  Your legs or feet are numb.  Your feet always feel cold. Get help right away if:  There is increasing redness, swelling, or pain in or around a wound.  There is a red line that goes up your leg.  Pus is coming from a wound.  You develop a fever or as directed by your health care provider.  You notice a bad smell coming from an ulcer or wound. This information is not intended to replace advice given to you by your health care provider. Make sure you discuss any questions you have with your health care provider. Document Released: 03/17/2000 Document Revised: 08/26/2015 Document Reviewed: 08/27/2012 [employer]vier Interactive Patient Education  2017 [employer].

## 2017-01-29 NOTE — Progress Notes (Signed)
   Subjective:    Patient ID: Elizabeth Park, female    DOB: 01-17-1971, 46 y.o.   MRN: [id number]  HPI  Presents today requesting a diabetic foot exam. She complains of ongoing pain, burning, numbness and her feet on and off weightbearing. She denies any history of claudication or open wounds Patient states she discontinued smoking in 2010   Review of Systems  All other systems reviewed and are negative.      Objective:   Physical Exam  Orientated 3  Vascular: There is no calf pain or calf tenderness DP pulses 2/4 bilaterally PT pulses nonpalpable bilaterally Capillary Reflex within normal limits bilaterally There is no rubor   Neurological: Sensation to 10 g monofilament wire intact 6/8 right and 5/8 left Vibratory sensation reactive bilaterally Ankle reflex reactive bilaterally  Dermatological: No open skin lesions bilaterally Light occasional hair growth noted bilaterally  Musculoskeletal: Manual motor testing dorsi flexion, plantar flexion, inversion, eversion 5/5 bilaterally There is no pain or crepitus upon range of motion ankle, subtalar, midtarsal joints bilaterally      Assessment & Plan:   Assessment: Diabetic peripheral neuropathy Absent posterior tibial pulses bilaterally  Plan: Today I reviewed the results of exam with patient today. I made aware that the posterior tibial pulses were nonpalpable today and referred to the vascular lab for lower extremity arterial Doppler for the indication diabetic with absent posterior tibial pulses. Patient will be notified results

## 2017-01-31 ENCOUNTER — Encounter (HOSPITAL_COMMUNITY)
Admission: RE | Admit: 2017-01-31 | Discharge: 2017-01-31 | Disposition: A | Discharge status: Home / Self Care | Payer: Medicare Other | Source: Ambulatory Visit | Attending: Cardiovascular Disease | Admitting: Cardiovascular Disease

## 2017-01-31 DIAGNOSIS — Z48812 Encounter for surgical aftercare following surgery on the circulatory system: Secondary | ICD-10-CM | POA: Diagnosis not present

## 2017-01-31 DIAGNOSIS — Z955 Presence of coronary angioplasty implant and graft: Secondary | ICD-10-CM | POA: Diagnosis not present

## 2017-02-02 ENCOUNTER — Encounter (HOSPITAL_COMMUNITY)
Admission: RE | Admit: 2017-02-02 | Discharge: 2017-02-02 | Disposition: A | Discharge status: Home / Self Care | Payer: Medicare Other | Source: Ambulatory Visit | Attending: Cardiovascular Disease | Admitting: Cardiovascular Disease

## 2017-02-02 DIAGNOSIS — Z955 Presence of coronary angioplasty implant and graft: Secondary | ICD-10-CM | POA: Insufficient documentation

## 2017-02-02 DIAGNOSIS — Z48812 Encounter for surgical aftercare following surgery on the circulatory system: Secondary | ICD-10-CM | POA: Insufficient documentation

## 2017-02-02 NOTE — Progress Notes (Signed)
Cardiac Individual Treatment Plan  Patient Details  Name: Elizabeth Park MRN: [id number] Date of Birth: May 09, 1970 Referring Provider:     CARDIAC REHAB PHASE II ORIENTATION from 10/26/2016 in [facility]  Referring Provider  Lauree Chandler MD      Initial Encounter Date:    CARDIAC REHAB PHASE II ORIENTATION from 10/26/2016 in [facility]  Date  10/26/16  Referring Provider  Lauree Chandler MD      Visit Diagnosis: Status post coronary artery stent placement  Patient's Home Medications on Admission:  Current Outpatient Prescriptions:  .  amLODipine (NORVASC) 5 MG tablet, Take 5 mg by mouth 2 (two) times daily., Disp: , Rfl:  .  aspirin 81 MG tablet, Take 81 mg by mouth daily. , Disp: , Rfl:  .  carvedilol (COREG) 12.5 MG tablet, Take 12.5 mg by mouth 2 (two) times daily., Disp: , Rfl:  .  clopidogrel (PLAVIX) 75 MG tablet, Take 1 tablet (75 mg total) by mouth daily with breakfast., Disp: 90 tablet, Rfl: 4 .  fluticasone (FLONASE) 50 MCG/ACT nasal spray, Place 1 spray into both nostrils 2 (two) times daily. , Disp: , Rfl:  .  furosemide (LASIX) 20 MG tablet, TAKE ONE TABLET BY MOUTH DAILY AS NEEDED FOR SWELLING, Disp: 30 tablet, Rfl: 9 .  gabapentin (NEURONTIN) 300 MG capsule, Take 300 mg by mouth 2 (two) times daily., Disp: , Rfl:  .  insulin aspart (NOVOLOG FLEXPEN) 100 UNIT/ML injection, Inject 0-12 Units into the skin See admin instructions. Per sliding scale Under 150 no units 150-199 take 8 units 200-249 take 10 units 250-299 take 12 units, Disp: , Rfl:  .  lisinopril (PRINIVIL,ZESTRIL) 20 MG tablet, Take 20 mg by mouth daily., Disp: , Rfl:  .  montelukast (SINGULAIR) 10 MG tablet, Take 10 mg by mouth at bedtime., Disp: , Rfl:  .  Naloxone HCl (EVZIO) 0.4 MG/0.4ML SOAJ, Inject 0.4 mg as directed as needed (for possible overdose). , Disp: , Rfl:  .  nitroGLYCERIN (NITROSTAT) 0.4 MG SL tablet, Place 1 tablet  (0.4 mg total) under the tongue every 5 (five) minutes as needed for chest pain., Disp: 25 tablet, Rfl: 6 .  oxyCODONE-acetaminophen (PERCOCET) 10-325 MG per tablet, Take 1 tablet by mouth 3 (three) times daily., Disp: , Rfl:  .  rosuvastatin (CRESTOR) 10 MG tablet, Take 10 mg by mouth every evening. , Disp: , Rfl:  .  Semaglutide (OZEMPIC) 0.25 or 0.5 MG/DOSE SOPN, Inject 25 mg into the skin once a week. .25mg  x4 weeks, .5mg  x2 weeks, Disp: , Rfl:  .  SitaGLIPtin-MetFORMIN HCl (JANUMET XR) 50-1000 MG TB24, Take 1 tablet by mouth 2 (two) times daily., Disp: , Rfl:  .  TRESIBA FLEXTOUCH 100 UNIT/ML SOPN FlexTouch Pen, Inject 40 Units as directed at bedtime. , Disp: , Rfl: 4  Past Medical History: Past Medical History:  Diagnosis Date  . CHF (congestive heart failure) ([place])   . Coronary artery disease   . Diabetes mellitus   . H/O emotional problems   . Headache, migraine   . High cholesterol   . History of blood transfusion   . Hypertension   . MRSA (methicillin resistant Staphylococcus aureus)   . MVP (mitral valve prolapse)   . Neuromuscular disorder ([place])   . Neuropathy   . Sebaceous cyst     Tobacco Use: History  Smoking Status  . Former Smoker  . Packs/day: 1.00  . Years: 28.00  .  Types: Cigarettes  . Quit date: 03/31/2009  Smokeless Tobacco  . Never Used    Labs: Recent Review Flowsheet Data    Labs for ITP Cardiac and Pulmonary Rehab Latest Ref Rng & Units 04/10/2009 04/10/2009 04/26/2009 05/28/2009 09/02/2009   Cholestrol 0 - 200 mg/dL - - - - -   LDLCALC 0 - 99 mg/dL - - - - -   HDL >39 mg/dL - - - - -   Trlycerides <150 mg/dL - - - - -   Hemoglobin A1c % - - 8.0(H) - 6.5   PHART 7.350 - 7.400 - - - 7.454(H) -   PCO2ART 35.0 - 45.0 mmHg - - - 33.9(L) -   HCO3 20.0 - 24.0 mEq/L - - - 23.4 -   TCO2 0 - 100 mmol/L 42 39 - 24.5 -   ACIDBASEDEF 0.0 - 2.0 mmol/L - - - 0.0 -   O2SAT % - - - 97.9 -      Capillary Blood Glucose: Lab Results  Component Value Date    GLUCAP 141 (H) 01/10/2017   GLUCAP 139 (H) 01/01/2017   GLUCAP 110 (H) 01/01/2017   GLUCAP 175 (H) 12/27/2016   GLUCAP 125 (H) 12/01/2016     Exercise Target Goals:    Exercise Program Goal: Individual exercise prescription set with THRR, safety & activity barriers. Participant demonstrates ability to understand and report RPE using BORG scale, to self-measure pulse accurately, and to acknowledge the importance of the exercise prescription.  Exercise Prescription Goal: Starting with aerobic activity 30 plus minutes a day, 3 days per week for initial exercise prescription. Provide home exercise prescription and guidelines that participant acknowledges understanding prior to discharge.  Activity Barriers & Risk Stratification:     Activity Barriers & Cardiac Risk Stratification - 10/26/16 1408      Activity Barriers & Cardiac Risk Stratification   Activity Barriers Back Problems   Cardiac Risk Stratification High      6 Minute Walk:     6 Minute Walk    Row Name 10/26/16 1501         6 Minute Walk   Phase Initial     Distance 1600 feet     Walk Time 6 minutes     # of Rest Breaks 0     MPH 3     METS 3.9     RPE 11     VO2 Peak 13.7     Symptoms No     Resting HR 64 bpm     Resting BP 110/72     Max Ex. HR 83 bpm     Max Ex. BP 117/69     2 Minute Post BP 117/77        Oxygen Initial Assessment:   Oxygen Re-Evaluation:   Oxygen Discharge (Final Oxygen Re-Evaluation):   Initial Exercise Prescription:     Initial Exercise Prescription - 10/26/16 1500      Date of Initial Exercise RX and Referring Provider   Date 10/26/16   Referring Provider Lauree Chandler MD     Treadmill   MPH 2.5   Grade 0   Minutes 10   METs 2.5     Bike   Level 1   Minutes 10   METs 3.1     NuStep   Level 3   SPM 80   Minutes 10   METs 2.9     Prescription Details   Frequency (times per week) 3   Duration  Progress to 45 minutes of aerobic exercise  without signs/symptoms of physical distress     Intensity   THRR 40-80% of Max Heartrate 69-138   Ratings of Perceived Exertion 11-13   Perceived Dyspnea 0-4     Progression   Progression Continue to progress workloads to maintain intensity without signs/symptoms of physical distress.     Resistance Training   Training Prescription Yes   Weight 2   Reps 10-15      Perform Capillary Blood Glucose checks as needed.  Exercise Prescription Changes:     Exercise Prescription Changes    Row Name 11/01/16 1700 12/11/16 1700 01/09/17 1600 01/31/17 1500       Response to Exercise   Blood Pressure (Admit) 102/68 106/60 108/62 94/60    Blood Pressure (Exercise) 132/84 114/70 120/70 124/70    Blood Pressure (Exit) 104/60 102/62 89/62  recheck 91/70, pt asymptomatic, encourage hydration 94/64   pt asymptomatic, encourage hydration before/after exercise    Heart Rate (Admit) 65 bpm 72 bpm 83 bpm 74 bpm    Heart Rate (Exercise) 81 bpm 83 bpm 99 bpm 94 bpm    Heart Rate (Exit) 65 bpm 68 bpm 65 bpm 70 bpm    Rating of Perceived Exertion (Exercise) 11 11 11 11     Symptoms none none none none    Comments pt was oriented to exercise equipment pt was oriented to exercise equipment  -  -    Duration Continue with 30 min of aerobic exercise without signs/symptoms of physical distress. Continue with 30 min of aerobic exercise without signs/symptoms of physical distress. Continue with 30 min of aerobic exercise without signs/symptoms of physical distress. Continue with 30 min of aerobic exercise without signs/symptoms of physical distress.    Intensity THRR unchanged THRR unchanged THRR unchanged THRR unchanged      Progression   Progression Continue to progress workloads to maintain intensity without signs/symptoms of physical distress. Continue to progress workloads to maintain intensity without signs/symptoms of physical distress. Continue to progress workloads to maintain intensity without  signs/symptoms of physical distress. Continue to progress workloads to maintain intensity without signs/symptoms of physical distress.    Average METs 2.6 2.5 3.3 3.3      Resistance Training   Training Prescription Yes Yes Yes No  relaxation day    Weight 2lbs 2lbs 3lbs  -    Reps 10-15 10-15 10-15  -    Time 10 Minutes 10 Minutes 10 Minutes  -      Interval Training   Interval Training  -  -  - No      Treadmill   MPH  -  - 2.6 2.6    Grade  -  - 1 1    Minutes  -  - 15 15    METs  -  - 3.35 3.35      Bike   Level 1 1 1.2 1.2    Minutes 15 15 15 15     METs 2.95 2.95 3.38 3.38      NuStep   Level 3 4  -  -    SPM 70 70  -  -    Minutes 15 15  -  -    METs 2.3 2.1  -  -      Home Exercise Plan   Plans to continue exercise at  - Home (comment)  walking on treadmill Home (comment)  walking on treadmill Home (comment)  walking on treadmill  Frequency  - Add 2 additional days to program exercise sessions. Add 2 additional days to program exercise sessions. Add 2 additional days to program exercise sessions.    Initial Home Exercises Provided  - 11/22/16 11/22/16 11/22/16       Exercise Comments:     Exercise Comments    Row Name 11/15/16 1702 12/12/16 1439 01/09/17 1642 01/31/17 1612     Exercise Comments Pt was oriented to exercise equipment on 11/01/16. Pt tolerated exercise fairly well; will continue to monitor pt's progress and activity levels. Reviewed and METs goals. Pt is tolerating exercise well; will continue to monitor pt's progress. Reviewed and METs goals. Pt is tolerating exercise well; will continue to monitor pt's progress. Reviewed and METs goals. Pt is tolerating exercise well; will continue to monitor pt's progress.       Exercise Goals and Review:     Exercise Goals    Row Name 10/26/16 1416 12/12/16 1439           Exercise Goals   Increase Physical Activity (P)  Yes Yes      Intervention (P)  Provide advice, education, support and  counseling about physical activity/exercise needs.;Develop an individualized exercise prescription for aerobic and resistive training based on initial evaluation findings, risk stratification, comorbidities and participant's personal goals. Provide advice, education, support and counseling about physical activity/exercise needs.;Develop an individualized exercise prescription for aerobic and resistive training based on initial evaluation findings, risk stratification, comorbidities and participant's personal goals.      Expected Outcomes (P)  Achievement of increased cardiorespiratory fitness and enhanced flexibility, muscular endurance and strength shown through measurements of functional capacity and personal statement of participant. Achievement of increased cardiorespiratory fitness and enhanced flexibility, muscular endurance and strength shown through measurements of functional capacity and personal statement of participant.      Increase Strength and Stamina (P)  Yes Yes      Intervention (P)  Provide advice, education, support and counseling about physical activity/exercise needs.;Develop an individualized exercise prescription for aerobic and resistive training based on initial evaluation findings, risk stratification, comorbidities and participant's personal goals. Provide advice, education, support and counseling about physical activity/exercise needs.;Develop an individualized exercise prescription for aerobic and resistive training based on initial evaluation findings, risk stratification, comorbidities and participant's personal goals.      Expected Outcomes (P)  Achievement of increased cardiorespiratory fitness and enhanced flexibility, muscular endurance and strength shown through measurements of functional capacity and personal statement of participant. Achievement of increased cardiorespiratory fitness and enhanced flexibility, muscular endurance and strength shown through measurements of  functional capacity and personal statement of participant.      Able to understand and use rate of perceived exertion (RPE) scale  - Yes      Intervention  - Provide education and explanation on how to use RPE scale      Expected Outcomes  - Short Term: Able to use RPE daily in rehab to express subjective intensity level;Long Term:  Able to use RPE to guide intensity level when exercising independently      Knowledge and understanding of Target Heart Rate Range (THRR)  - Yes      Intervention  - Provide education and explanation of THRR including how the numbers were predicted and where they are located for reference      Expected Outcomes  - Short Term: Able to state/look up THRR;Long Term: Able to use THRR to govern intensity when exercising independently;Short Term: Able to use daily as  guideline for intensity in rehab      Able to check pulse independently  - Yes      Intervention  - Provide education and demonstration on how to check pulse in carotid and radial arteries.;Review the importance of being able to check your own pulse for safety during independent exercise      Expected Outcomes  - Short Term: Able to explain why pulse checking is important during independent exercise;Long Term: Able to check pulse independently and accurately      Understanding of Exercise Prescription  - Yes      Intervention  - Provide education, explanation, and written materials on patient's individual exercise prescription      Expected Outcomes  - Short Term: Able to explain program exercise prescription;Long Term: Able to explain home exercise prescription to exercise independently         Exercise Goals Re-Evaluation :     Exercise Goals Re-Evaluation    Row Name 11/22/16 1502 12/12/16 1439 01/09/17 1642 01/31/17 1558 01/31/17 1607     Exercise Goal Re-Evaluation   Exercise Goals Review Increase Physical Activity;Increase Strenth and Stamina Increase Physical Activity;Able to understand and use rate  of perceived exertion (RPE) scale;Understanding of Exercise Prescription;Knowledge and understanding of Target Heart Rate Range (THRR);Increase Strength and Stamina;Able to check pulse independently Increase Physical Activity;Able to understand and use rate of perceived exertion (RPE) scale;Understanding of Exercise Prescription;Knowledge and understanding of Target Heart Rate Range (THRR);Increase Strength and Stamina;Able to check pulse independently  -  -   Comments Reviewed home exercise with pt today.  Pt plans to walk on treadmill for exercise.  Reviewed THR, pulse, RPE, sign and symptoms, NTG use, and when to call 911 or MD.  Also discussed weather considerations and indoor options.  Pt voiced understanding. Reviewed home exercise with pt today.  Pt plans to walk on treadmill for exercise.  Reviewed THR, pulse, RPE, sign and symptoms, NTG use, and when to call 911 or MD.  Also discussed weather considerations and indoor options.  Pt voiced understanding. Pt is walking 1-2 miles on Tues./Thurs without difficulty or signs and symptoms of CP, dizziness or lightheadedness. Pt is maintaining activity level in cardiac rehab and [facility] though having chest discomfort. Pt has schedled an appoint Pt is maintaining activity level in cardiac rehab and [facility] though having chest discomfort. Pt is scheduled for an appointment with cardiology next week. Will f/u.   Expected Outcomes Pt will be compliant with HEP and improve in cardiorespiratory fitness Pt will be compliant with HEP and improve in cardiorespiratory fitness Pt will be compliant with HEP and improve in cardiorespiratory fitness  - Pt will be able to exercise without signs and symtoms of physical distress       Discharge Exercise Prescription (Final Exercise Prescription Changes):     Exercise Prescription Changes - 01/31/17 1500      Response to Exercise   Blood Pressure (Admit) 94/60   Blood Pressure (Exercise) 124/70   Blood Pressure (Exit) 94/64    pt asymptomatic, encourage hydration before/after exercise   Heart Rate (Admit) 74 bpm   Heart Rate (Exercise) 94 bpm   Heart Rate (Exit) 70 bpm   Rating of Perceived Exertion (Exercise) 11   Symptoms none   Duration Continue with 30 min of aerobic exercise without signs/symptoms of physical distress.   Intensity THRR unchanged     Progression   Progression Continue to progress workloads to maintain intensity without signs/symptoms of physical distress.  Average METs 3.3     Resistance Training   Training Prescription No  relaxation day     Interval Training   Interval Training No     Treadmill   MPH 2.6   Grade 1   Minutes 15   METs 3.35     Bike   Level 1.2   Minutes 15   METs 3.38     Home Exercise Plan   Plans to continue exercise at Home (comment)  walking on treadmill   Frequency Add 2 additional days to program exercise sessions.   Initial Home Exercises Provided 11/22/16      Nutrition:  Target Goals: Understanding of nutrition guidelines, daily intake of sodium 1500mg , cholesterol 200mg , calories 30% from fat and 7% or less from saturated fats, daily to have 5 or more servings of fruits and vegetables.  Biometrics:     Pre Biometrics - 10/26/16 1514      Pre Biometrics   Waist Circumference 44.5 inches   Hip Circumference 47.5 inches   Waist to Hip Ratio 0.94 %   Triceps Skinfold 15 mm   % Body Fat 41.9 %   Grip Strength 40 kg   Flexibility 8.5 in   Single Leg Stand 28.8 seconds       Nutrition Therapy Plan and Nutrition Goals:     Nutrition Therapy & Goals - 11/01/16 1455      Nutrition Therapy   Diet Carb Modified, Therapeutic Lifestyle Changes     Personal Nutrition Goals   Nutrition Goal Pt to identify food quantities necessary to achieve weight loss of 6-24 lb (2.7-10.9 kg) at graduation from cardiac rehab. Goal wt of 175 lb desired.      Intervention Plan   Intervention Prescribe, educate and counsel regarding individualized  specific dietary modifications aiming towards targeted core components such as weight, hypertension, lipid management, diabetes, heart failure and other comorbidities.   Expected Outcomes Short Term Goal: Understand basic principles of dietary content, such as calories, fat, sodium, cholesterol and nutrients.;Long Term Goal: Adherence to prescribed nutrition plan.      Nutrition Discharge: Nutrition Scores:     Nutrition Assessments - 11/01/16 1455      MEDFICTS Scores   Pre Score 9      Nutrition Goals Re-Evaluation:   Nutrition Goals Re-Evaluation:   Nutrition Goals Discharge (Final Nutrition Goals Re-Evaluation):   Psychosocial: Target Goals: Acknowledge presence or absence of significant depression and/or stress, maximize coping skills, provide positive support system. Participant is able to verbalize types and ability to use techniques and skills needed for reducing stress and depression.  Initial Review & Psychosocial Screening:     Initial Psych Review & Screening - 10/26/16 1538      Initial Review   Current issues with Current Stress Concerns   Source of Stress Concerns Family   Comments Patient rated "low" - family son who is in the [employer] is leaving soon for [place]     Family Dynamics   Good Support System? Yes  pt reports significant other and two sons.     Barriers   Psychosocial barriers to participate in program The patient should benefit from training in stress management and relaxation.      Quality of Life Scores:     Quality of Life - 11/24/16 1535      Quality of Life Scores   Health/Function Pre --  pt reports decreased overall symptoms c/o increased anxiety related dyspnea.  pt also concerned about diabetes  medications. discussed with Derek Mound, RD, CDE who will schedule to meet with pt to discuss diabetic regimen.    Socioeconomic Pre --  pt interested in vocational rehab. pt is also pursuing disability   GLOBAL Pre --  pt offered  emotional support and reassurance.  Dr. Criss Rosales manages diabetes, next f/u 12/27/2016.  pt instructed to discuss lipid questions with Dr. Angelena Form      PHQ-9: Recent Review Flowsheet Data    Depression screen [facility] 2/9 11/01/2016 08/09/2016 09/13/2015   Decreased Interest 0 0 0   Down, Depressed, Hopeless 0 0 0   PHQ - 2 Score 0 0 0     Interpretation of Total Score  Total Score Depression Severity:  1-4 = Minimal depression, 5-9 = Mild depression, 10-14 = Moderate depression, 15-19 = Moderately severe depression, 20-27 = Severe depression   Psychosocial Evaluation and Intervention:     Psychosocial Evaluation - 11/01/16 1639      Psychosocial Evaluation & Interventions   Interventions Encouraged to exercise with the program and follow exercise prescription;Stress management education;Relaxation education   Comments no psychosocial needs identified, no interventions necessary    Expected Outcomes pt will exhibit positive outlook with good coping skills    Continue Psychosocial Services  No Follow up required      Psychosocial Re-Evaluation:     Psychosocial Re-Evaluation    [place] Name 11/16/16 1133 12/13/16 1818 01/09/17 0717 02/02/17 1157       Psychosocial Re-Evaluation   Current issues with None Identified None Identified None Identified None Identified    Comments no psychosocial needs identified, no interventions necessary.  no psychosocial needs identified, no interventions necessary.  no psychosocial needs identified, no interventions necessary.  no psychosocial needs identified, no interventions necessary.     Expected Outcomes pt will exhibit positive outlook with good coping skills.  pt will exhibit positive outlook with good coping skills.  pt will exhibit positive outlook with good coping skills.  pt will exhibit positive outlook with good coping skills.     Interventions Stress management education;Relaxation education;Encouraged to attend Cardiac Rehabilitation for the  exercise Stress management education;Relaxation education;Encouraged to attend Cardiac Rehabilitation for the exercise Stress management education;Relaxation education;Encouraged to attend Cardiac Rehabilitation for the exercise Stress management education;Relaxation education;Encouraged to attend Cardiac Rehabilitation for the exercise    Continue Psychosocial Services  No Follow up required No Follow up required No Follow up required No Follow up required       Psychosocial Discharge (Final Psychosocial Re-Evaluation):     Psychosocial Re-Evaluation - 02/02/17 1157      Psychosocial Re-Evaluation   Current issues with None Identified   Comments no psychosocial needs identified, no interventions necessary.    Expected Outcomes pt will exhibit positive outlook with good coping skills.    Interventions Stress management education;Relaxation education;Encouraged to attend Cardiac Rehabilitation for the exercise   Continue Psychosocial Services  No Follow up required      Vocational Rehabilitation: Provide vocational rehab assistance to qualifying candidates.   Vocational Rehab Evaluation & Intervention:     Vocational Rehab - 11/22/16 1404      Initial Vocational Rehab Evaluation & Intervention   Assessment shows need for Vocational Rehabilitation Yes   Vocational Rehab Packet given to patient 11/22/16      Education: Education Goals: Education classes will be provided on a weekly basis, covering required topics. Participant will state understanding/return demonstration of topics presented.  Learning Barriers/Preferences:     Learning Barriers/Preferences -  10/26/16 1407      Learning Barriers/Preferences   Learning Barriers None   Learning Preferences Skilled Demonstration      Education Topics: Count Your Pulse:  -Group instruction provided by verbal instruction, demonstration, patient participation and written materials to support subject.  Instructors address  importance of being able to find your pulse and how to count your pulse when at home without a heart monitor.  Patients get hands on experience counting their pulse with staff help and individually.   Heart Attack, Angina, and Risk Factor Modification:  -Group instruction provided by verbal instruction, video, and written materials to support subject.  Instructors address signs and symptoms of angina and heart attacks.    Also discuss risk factors for heart disease and how to make changes to improve heart health risk factors.   Functional Fitness:  -Group instruction provided by verbal instruction, demonstration, patient participation, and written materials to support subject.  Instructors address safety measures for doing things around the house.  Discuss how to get up and down off the floor, how to pick things up properly, how to safely get out of a chair without assistance, and balance training.   Meditation and Mindfulness:  -Group instruction provided by verbal instruction, patient participation, and written materials to support subject.  Instructor addresses importance of mindfulness and meditation practice to help reduce stress and improve awareness.  Instructor also leads participants through a meditation exercise.    Stretching for Flexibility and Mobility:  -Group instruction provided by verbal instruction, patient participation, and written materials to support subject.  Instructors lead participants through series of stretches that are designed to increase flexibility thus improving mobility.  These stretches are additional exercise for major muscle groups that are typically performed during regular warm up and cool down.   Hands Only CPR:  -Group verbal, video, and participation provides a basic overview of AHA guidelines for community CPR. Role-play of emergencies allow participants the opportunity to practice calling for help and chest compression technique with discussion of AED  use.   Hypertension: -Group verbal and written instruction that provides a basic overview of hypertension including the most recent diagnostic guidelines, risk factor reduction with self-care instructions and medication management.    Nutrition I class: Heart Healthy Eating:  -Group instruction provided by PowerPoint slides, verbal discussion, and written materials to support subject matter. The instructor gives an explanation and review of the Therapeutic Lifestyle Changes diet recommendations, which includes a discussion on lipid goals, dietary fat, sodium, fiber, plant stanol/sterol esters, sugar, and the components of a well-balanced, healthy diet.   Nutrition II class: Lifestyle Skills:  -Group instruction provided by PowerPoint slides, verbal discussion, and written materials to support subject matter. The instructor gives an explanation and review of label reading, grocery shopping for heart health, heart healthy recipe modifications, and ways to make healthier choices when eating out.   Diabetes Question & Answer:  -Group instruction provided by PowerPoint slides, verbal discussion, and written materials to support subject matter. The instructor gives an explanation and review of diabetes co-morbidities, pre- and post-prandial blood glucose goals, pre-exercise blood glucose goals, signs, symptoms, and treatment of hypoglycemia and hyperglycemia, and foot care basics.   Diabetes Blitz:  -Group instruction provided by PowerPoint slides, verbal discussion, and written materials to support subject matter. The instructor gives an explanation and review of the physiology behind type 1 and type 2 diabetes, diabetes medications and rational behind using different medications, pre- and post-prandial blood glucose recommendations and Hemoglobin  A1c goals, diabetes diet, and exercise including blood glucose guidelines for exercising safely.    Portion Distortion:  -Group instruction provided by  PowerPoint slides, verbal discussion, written materials, and food models to support subject matter. The instructor gives an explanation of serving size versus portion size, changes in portions sizes over the last 20 years, and what consists of a serving from each food group.   Stress Management:  -Group instruction provided by verbal instruction, video, and written materials to support subject matter.  Instructors review role of stress in heart disease and how to cope with stress positively.     Exercising on Your Own:  -Group instruction provided by verbal instruction, power point, and written materials to support subject.  Instructors discuss benefits of exercise, components of exercise, frequency and intensity of exercise, and end points for exercise.  Also discuss use of nitroglycerin and activating EMS.  Review options of places to exercise outside of rehab.  Review guidelines for sex with heart disease.   CARDIAC REHAB PHASE II EXERCISE from 12/06/2016 in [place]  Date  12/06/16  Educator  EP  Instruction Review Code  2- meets goals/outcomes      Cardiac Drugs I:  -Group instruction provided by verbal instruction and written materials to support subject.  Instructor reviews cardiac drug classes: antiplatelets, anticoagulants, beta blockers, and statins.  Instructor discusses reasons, side effects, and lifestyle considerations for each drug class.   Cardiac Drugs II:  -Group instruction provided by verbal instruction and written materials to support subject.  Instructor reviews cardiac drug classes: angiotensin converting enzyme inhibitors (ACE-I), angiotensin II receptor blockers (ARBs), nitrates, and calcium channel blockers.  Instructor discusses reasons, side effects, and lifestyle considerations for each drug class.   Anatomy and Physiology of the Circulatory System:  Group verbal and written instruction and models provide basic cardiac anatomy and  physiology, with the coronary electrical and arterial systems. Review of: AMI, Angina, Valve disease, Heart Failure, Peripheral Artery Disease, Cardiac Arrhythmia, Pacemakers, and the ICD.   CARDIAC REHAB PHASE II EXERCISE from 12/06/2016 in [facility]  Date  11/01/16  Instruction Review Code  2- meets goals/outcomes      Other Education:  -Group or individual verbal, written, or video instructions that support the educational goals of the cardiac rehab program.   Knowledge Questionnaire Score:     Knowledge Questionnaire Score - 10/26/16 1428      Knowledge Questionnaire Score   Pre Score 21/24      Core Components/Risk Factors/Patient Goals at Admission:     Personal Goals and Risk Factors at Admission - 11/01/16 1636      Core Components/Risk Factors/Patient Goals on Admission    Weight Management Obesity;Yes   Intervention Weight Management: Develop a combined nutrition and exercise program designed to reach desired caloric intake, while maintaining appropriate intake of nutrient and fiber, sodium and fats, and appropriate energy expenditure required for the weight goal.;Weight Management: Provide education and appropriate resources to help participant work on and attain dietary goals.;Weight Management/Obesity: Establish reasonable short term and long term weight goals.;Obesity: Provide education and appropriate resources to help participant work on and attain dietary goals.   Admit Weight 210 lb 15.7 oz (95.7 kg)   Expected Outcomes Short Term: Continue to assess and modify interventions until short term weight is achieved;Long Term: Adherence to nutrition and physical activity/exercise program aimed toward attainment of established weight goal;Weight Loss: Understanding of general recommendations for a  balanced deficit meal plan, which promotes 1-2 lb weight loss per week and includes a negative energy balance of [phone] kcal/d;Understanding  recommendations for meals to include 15-35% energy as protein, 25-35% energy from fat, 35-60% energy from carbohydrates, less than 200mg  of dietary cholesterol, 20-35 gm of total fiber daily   Diabetes Yes   Intervention Provide education about signs/symptoms and action to take for hypo/hyperglycemia.;Provide education about proper nutrition, including hydration, and aerobic/resistive exercise prescription along with prescribed medications to achieve blood glucose in normal ranges: Fasting glucose 65-99 mg/dL   Expected Outcomes Short Term: Participant verbalizes understanding of the signs/symptoms and immediate care of hyper/hypoglycemia, proper foot care and importance of medication, aerobic/resistive exercise and nutrition plan for blood glucose control.;Long Term: Attainment of HbA1C < 7%.   Hypertension Yes   Intervention Provide education on lifestyle modifcations including regular physical activity/exercise, weight management, moderate sodium restriction and increased consumption of fresh fruit, vegetables, and low fat dairy, alcohol moderation, and smoking cessation.;Monitor prescription use compliance.   Expected Outcomes Short Term: Continued assessment and intervention until BP is < 140/7mm HG in hypertensive participants. < 130/47mm HG in hypertensive participants with diabetes, heart failure or chronic kidney disease.;Long Term: Maintenance of blood pressure at goal levels.   Lipids Yes   Intervention Provide education and support for participant on nutrition & aerobic/resistive exercise along with prescribed medications to achieve LDL 70mg , HDL >40mg .   Expected Outcomes Short Term: Participant states understanding of desired cholesterol values and is compliant with medications prescribed. Participant is following exercise prescription and nutrition guidelines.;Long Term: Cholesterol controlled with medications as prescribed, with individualized exercise RX and with personalized nutrition  plan. Value goals: LDL < 70mg , HDL > 40 mg.      Core Components/Risk Factors/Patient Goals Review:      Goals and Risk Factor Review    Row Name 11/16/16 1131 12/13/16 1818 01/09/17 0716 02/02/17 1156       Core Components/Risk Factors/Patient Goals Review   Personal Goals Review Diabetes;Hypertension;Lipids Diabetes;Hypertension;Lipids Diabetes;Hypertension;Lipids Diabetes;Hypertension;Lipids    Review pt with multiple CAD RF displays eagerness to participate in CR exercise, nutrition and lifestyle education. pt with multiple CAD RF displays eagerness to participate in CR exercise, nutrition and lifestyle education. pt with multiple CAD RF displays eagerness to participate in CR exercise, nutrition and lifestyle education. pt blood sugar normalizing with current treatment.  pt BP WNL pt with multiple CAD RF displays eagerness to participate in CR exercise, nutrition and lifestyle education. pt blood sugar normalizing with current treatment.  pt BP WNL. pt notes increased stamina with household activities.    Expected Outcomes pt will participate in CR exercise,nutrition, and lifestyle education opportunities.   pt will participate in CR exercise,nutrition, and lifestyle education opportunities.   pt will participate in CR exercise,nutrition, and lifestyle education opportunities.   pt will participate in CR exercise,nutrition, and lifestyle education opportunities.         Core Components/Risk Factors/Patient Goals at Discharge (Final Review):      Goals and Risk Factor Review - 02/02/17 1156      Core Components/Risk Factors/Patient Goals Review   Personal Goals Review Diabetes;Hypertension;Lipids   Review pt with multiple CAD RF displays eagerness to participate in CR exercise, nutrition and lifestyle education. pt blood sugar normalizing with current treatment.  pt BP WNL. pt notes increased stamina with household activities.   Expected Outcomes pt will participate in CR  exercise,nutrition, and lifestyle education opportunities.        ITP  Comments:     ITP Comments    Row Name 10/26/16 1412 11/16/16 1130 12/13/16 1818 01/09/17 0716 02/02/17 1156   ITP Comments Dr. Fransico Him, Medical Director  Dr. Fransico Him, Medical Director  Dr. Fransico Him, Medical Director  30 day ITP review.  pt with good participation and attendance. 30 day ITP review.  pt with good participation and attendance.      Comments:

## 2017-02-05 ENCOUNTER — Encounter (HOSPITAL_COMMUNITY)
Admission: RE | Admit: 2017-02-05 | Discharge: 2017-02-05 | Disposition: A | Discharge status: Home / Self Care | Payer: Medicare Other | Source: Ambulatory Visit | Attending: Cardiovascular Disease | Admitting: Cardiovascular Disease

## 2017-02-05 DIAGNOSIS — Z955 Presence of coronary angioplasty implant and graft: Secondary | ICD-10-CM

## 2017-02-05 LAB — GLUCOSE, CAPILLARY: Glucose-Capillary: 109 mg/dL — ABNORMAL HIGH (ref 65–99)

## 2017-02-06 ENCOUNTER — Encounter: Payer: Self-pay | Admitting: Physician Assistant

## 2017-02-06 ENCOUNTER — Encounter (HOSPITAL_COMMUNITY): Payer: Self-pay

## 2017-02-06 ENCOUNTER — Emergency Department (HOSPITAL_COMMUNITY): Payer: Medicare Other

## 2017-02-06 ENCOUNTER — Telehealth: Payer: Self-pay | Admitting: Internal Medicine

## 2017-02-06 ENCOUNTER — Inpatient Hospital Stay (HOSPITAL_COMMUNITY)
Admission: EM | Admit: 2017-02-06 | Discharge: 2017-02-08 | DRG: 247 | Disposition: A | Discharge status: Home / Self Care | Payer: Medicare Other | Attending: Cardiology | Admitting: Cardiology

## 2017-02-06 ENCOUNTER — Ambulatory Visit (INDEPENDENT_AMBULATORY_CARE_PROVIDER_SITE_OTHER): Payer: Medicare Other | Admitting: Physician Assistant

## 2017-02-06 VITALS — BP 116/68 | HR 69 | Resp 16 | Ht 64.0 in | Wt 206.8 lb

## 2017-02-06 DIAGNOSIS — Z952 Presence of prosthetic heart valve: Secondary | ICD-10-CM

## 2017-02-06 DIAGNOSIS — I5032 Chronic diastolic (congestive) heart failure: Secondary | ICD-10-CM | POA: Diagnosis present

## 2017-02-06 DIAGNOSIS — I110 Hypertensive heart disease with heart failure: Secondary | ICD-10-CM | POA: Diagnosis not present

## 2017-02-06 DIAGNOSIS — Z7902 Long term (current) use of antithrombotics/antiplatelets: Secondary | ICD-10-CM

## 2017-02-06 DIAGNOSIS — I10 Essential (primary) hypertension: Secondary | ICD-10-CM | POA: Diagnosis not present

## 2017-02-06 DIAGNOSIS — Z9889 Other specified postprocedural states: Secondary | ICD-10-CM

## 2017-02-06 DIAGNOSIS — R079 Chest pain, unspecified: Secondary | ICD-10-CM | POA: Diagnosis not present

## 2017-02-06 DIAGNOSIS — I25810 Atherosclerosis of coronary artery bypass graft(s) without angina pectoris: Secondary | ICD-10-CM | POA: Diagnosis present

## 2017-02-06 DIAGNOSIS — I249 Acute ischemic heart disease, unspecified: Secondary | ICD-10-CM | POA: Diagnosis not present

## 2017-02-06 DIAGNOSIS — I252 Old myocardial infarction: Secondary | ICD-10-CM

## 2017-02-06 DIAGNOSIS — Z79899 Other long term (current) drug therapy: Secondary | ICD-10-CM

## 2017-02-06 DIAGNOSIS — I2510 Atherosclerotic heart disease of native coronary artery without angina pectoris: Secondary | ICD-10-CM | POA: Diagnosis not present

## 2017-02-06 DIAGNOSIS — R0789 Other chest pain: Secondary | ICD-10-CM | POA: Diagnosis not present

## 2017-02-06 DIAGNOSIS — E118 Type 2 diabetes mellitus with unspecified complications: Secondary | ICD-10-CM | POA: Diagnosis present

## 2017-02-06 DIAGNOSIS — I200 Unstable angina: Secondary | ICD-10-CM

## 2017-02-06 DIAGNOSIS — I080 Rheumatic disorders of both mitral and aortic valves: Secondary | ICD-10-CM | POA: Diagnosis present

## 2017-02-06 DIAGNOSIS — Z955 Presence of coronary angioplasty implant and graft: Secondary | ICD-10-CM

## 2017-02-06 DIAGNOSIS — Z87891 Personal history of nicotine dependence: Secondary | ICD-10-CM

## 2017-02-06 DIAGNOSIS — R7989 Other specified abnormal findings of blood chemistry: Secondary | ICD-10-CM

## 2017-02-06 DIAGNOSIS — Z833 Family history of diabetes mellitus: Secondary | ICD-10-CM

## 2017-02-06 DIAGNOSIS — Z8249 Family history of ischemic heart disease and other diseases of the circulatory system: Secondary | ICD-10-CM

## 2017-02-06 DIAGNOSIS — E785 Hyperlipidemia, unspecified: Secondary | ICD-10-CM

## 2017-02-06 DIAGNOSIS — Z7951 Long term (current) use of inhaled steroids: Secondary | ICD-10-CM

## 2017-02-06 DIAGNOSIS — Z7982 Long term (current) use of aspirin: Secondary | ICD-10-CM

## 2017-02-06 DIAGNOSIS — Z794 Long term (current) use of insulin: Secondary | ICD-10-CM

## 2017-02-06 DIAGNOSIS — N6019 Diffuse cystic mastopathy of unspecified breast: Secondary | ICD-10-CM | POA: Diagnosis present

## 2017-02-06 DIAGNOSIS — F17200 Nicotine dependence, unspecified, uncomplicated: Secondary | ICD-10-CM | POA: Diagnosis present

## 2017-02-06 DIAGNOSIS — Z888 Allergy status to other drugs, medicaments and biological substances status: Secondary | ICD-10-CM

## 2017-02-06 DIAGNOSIS — E1142 Type 2 diabetes mellitus with diabetic polyneuropathy: Secondary | ICD-10-CM | POA: Diagnosis present

## 2017-02-06 DIAGNOSIS — Z79891 Long term (current) use of opiate analgesic: Secondary | ICD-10-CM

## 2017-02-06 DIAGNOSIS — R9439 Abnormal result of other cardiovascular function study: Secondary | ICD-10-CM | POA: Diagnosis not present

## 2017-02-06 DIAGNOSIS — Z90710 Acquired absence of both cervix and uterus: Secondary | ICD-10-CM

## 2017-02-06 DIAGNOSIS — R778 Other specified abnormalities of plasma proteins: Secondary | ICD-10-CM

## 2017-02-06 DIAGNOSIS — Z951 Presence of aortocoronary bypass graft: Secondary | ICD-10-CM

## 2017-02-06 HISTORY — DX: Old myocardial infarction: I25.2

## 2017-02-06 LAB — CBC
HCT: 39.8 % (ref 36.0–46.0)
Hemoglobin: 13.8 g/dL (ref 12.0–15.0)
MCH: 31.4 pg (ref 26.0–34.0)
MCHC: 34.7 g/dL (ref 30.0–36.0)
MCV: 90.5 fL (ref 78.0–100.0)
Platelets: 300 10*3/uL (ref 150–400)
RBC: 4.4 MIL/uL (ref 3.87–5.11)
RDW: 12.8 % (ref 11.5–15.5)
WBC: 9.9 10*3/uL (ref 4.0–10.5)

## 2017-02-06 LAB — I-STAT TROPONIN, ED: Troponin i, poc: 0.03 ng/mL (ref 0.00–0.08)

## 2017-02-06 NOTE — Patient Instructions (Addendum)
Medication Instructions:  Your physician recommends that you continue on your current medications as directed. Please refer to the Current Medication list given to you today.   Labwork: TODAY:  LIPASE, CMET, CBC, STAT TROPONIN, PT/INR, SED RATE, & CRP   Testing/Procedures: Your physician has requested that you have a cardiac catheterization. Cardiac catheterization is used to diagnose and/or treat various heart conditions. Doctors may recommend this procedure for a number of different reasons. The most common reason is to evaluate chest pain. Chest pain can be a symptom of coronary artery disease (CAD), and cardiac catheterization can show whether plaque is narrowing or blocking your heart's arteries. This procedure is also used to evaluate the valves, as well as measure the blood flow and oxygen levels in different parts of your heart. For further information please visit [email or web address]. Please follow instruction sheet, as given.   Your physician has requested that you have an echocardiogram. Echocardiography is a painless test that uses sound waves to create images of your heart. It provides your doctor with information about the size and shape of your heart and how well your heart's chambers and valves are working. This procedure takes approximately one hour. There are no restrictions for this procedure.    Follow-Up: Your physician recommends that you schedule a follow-up appointment in: WILL BE SET UP AT [facility]    Any Other Special Instructions Will Be Listed Below (If Applicable).      [facility] [address] [place] [address] [place] [address] Dept: [phone] Loc: [phone]  [place]  02/06/2017  You are scheduled for a Cardiac Catheterization on Thursday, November 8 with Dr. Lauree Chandler.  1. Please arrive at the [facility] (Main Entrance A) at [facility]:  [place], [place] [phone] at 10:00 AM (two hours before your procedure to ensure your preparation). Free valet parking service is available.   Special note: Every effort is made to have your procedure done on time. Please understand that emergencies sometimes delay scheduled procedures.  2. Diet: Do not eat or drink anything after midnight prior to your procedure except sips of water to take medications.  3. Labs: You will need to have blood drawn on Tuesday, November 6 at [facility]. [address]  Open: 7:30am - 5pm    Phone: [phone]. You do not need to be fasting.  4. Medication instructions in preparation for your procedure:  Stop taking, Janumet (Metformin + Sitagliptin) on Wednesday, November 7.  Your last dose will be Wednesday morning.  Take only 1/2 dose of your Tyler Aas the night before your procedure. You may take your Novolog sliding scale as usual the night before your procedure. Do not take any insulin the day of your procedure.  On the morning of your procedure, take your Plavix/Clopidogrel and any morning medicines NOT listed above.  You may use sips of water.  5. Plan for one night stay--bring personal belongings. 6. Bring a current list of your medications and current insurance cards. 7. You MUST have a responsible person to drive you home. 8. Someone MUST be with you the first 24 hours after you arrive home or your discharge will be delayed. 9. Please wear clothes that are easy to get on and off and wear slip-on shoes.  Thank you for allowing [place] to care for you!   -- [facility]  Echocardiogram An echocardiogram, or echocardiography, uses sound waves (ultrasound) to produce an image of your heart. The echocardiogram is simple, painless, obtained within a short period of time, and offers valuable information to your health care provider. The images from an  echocardiogram can provide information such as:  Evidence of coronary artery disease (CAD).  Heart size.  Heart muscle function.  Heart valve function.  Aneurysm detection.  Evidence of a past heart attack.  Fluid buildup around the heart.  Heart muscle thickening.  Assess heart valve function.  Tell a health care provider about:  Any allergies you have.  All medicines you are taking, including vitamins, herbs, eye drops, creams, and over-the-counter medicines.  Any problems you or family members have had with anesthetic medicines.  Any blood disorders you have.  Any surgeries you have had.  Any medical conditions you have.  Whether you are pregnant or may be pregnant. What happens before the procedure? No special preparation is needed. Eat and drink normally. What happens during the procedure?  In order to produce an image of your heart, gel will be applied to your chest and a wand-like tool (transducer) will be moved over your chest. The gel will help transmit the sound waves from the transducer. The sound waves will harmlessly bounce off your heart to allow the heart images to be captured in real-time motion. These images will then be recorded.  You may need an IV to receive a medicine that improves the quality of the pictures. What happens after the procedure? You may return to your normal schedule including diet, activities, and medicines, unless your health care provider tells you otherwise. This information is not intended to replace advice given to you by your health care provider. Make sure you discuss any questions you have with your health care provider. Document Released: 03/17/2000 Document Revised: 11/06/2015 Document Reviewed: 11/25/2012 Elsevier Interactive Patient Education  2017 [employer].  If you need a refill on your cardiac medications before your next appointment, please call your pharmacy.

## 2017-02-06 NOTE — ED Triage Notes (Signed)
Pt states that she was to see her Cardiologist today and was called after going home and told she may be having a heart attack and troponin was elevated. CP has been going on for a month with some SOB and n/v, central CP. Hx of MI and stent placement.

## 2017-02-06 NOTE — Telephone Encounter (Signed)
I was  Asked by  Cardiology PA  To  Track results  Of  Troponin  And  Meanwhile  [facility]  Called  Me  About  Troponin  I  resuts  At  0.06,  We called patietn  immediatley  And  Relayed information about  Abnormal troponin, she  Was  Asked to present to ER  Emergently  And will admit  Her  To Possible Left  Heart  Cath  , and  Trop  Trend    Prior to that At  The time of  My phone call to her , she  Was  Asymptomatic.   Pt  Conveyed  She  Will present to ER  As  Soon  As  possible

## 2017-02-06 NOTE — Progress Notes (Signed)
Cardiology Office Note    Date:  02/06/2017  ID:  NGUYEN BUTLER, DOB Oct 10, 1970, MRN [id number] PCP:  Lucianne Lei, MD  Cardiologist: Dr. Angelena Form   Chief Complaint: f/u chest pain  History of Present Illness:  Elizabeth Park is a 46 y.o. female with history of CAD s/p 4VCABG 2011 and mitral valve repair for severe mitral regurgitation, DESx3 in 09/2016, DM, HTN, HLD, former tobacco abuse, chronic diastolic CHF who presents for follow-up.  To recap, cardiac cath 04/01/09 performed by Dr. 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 She also had severe mitral regurgitation. She underwent a a 4V CABG  (LIMA to LAD, SVG to subbranch of ramus intermediate, SVG to lateral subbranch of ramus intermediate, SVG to distal RCA) and mitral valve repair January 2011. She had chest pain in June 2018 that felt unlike prior angina; her nuclear stress test suggested ischemia. She underwent cardiac cath on 09/22/16 which showed progression of her CAD with resultant drug eluting stent to the vein graft to the PDA, a drug eluting stent in the native Ramus intermediate branch and a drug eluting stent in the protected left main. LVEF was normal. Last echo in 2016 was technically difficult, EF 55-60%, grade 1 DD, mildly reduced RV function, s/p mitral valve repair with no significant MR, possible mild mitral stenosis. Last OV with Dr. Angelena Form 10/2016 at which time he prescribed PRN Lasix for intermittent LE edema given prior elevated filling pressures on cath.   She was seen in the ED 01/10/17 with chest discomfort with strange pain in her jaw. It woke her out of a dead sleep and felt like a sharp pain in the middle of her chest going to her back. It lasted about an hour. Due to persistent discomfort she proceeded to the ED. Per ER note, by time of eval she had actually been pain free for a while. Initial troponin was negative,  second troponin was borderline at 0.03. No further values trended. EKG with nonspecific changes, felt nonacute. She was discharged home with plan for OP follow-up. Otherwise labs showed Hgb 14.4, K 4.3, Cr 0.83. Since that time she's continued to have chest pain almost every day, sometimes several times a day. It hurts down her back and arms. It lasts for several minutes up to 30 minutes. It is also brought on sometimes with exertion. She was unable to go as far as she usually does in cardiac rehab due to this and was asked to make a f/u appt here. She's also noticed it at times while out and about walking longer distances. She says today she actually feels fine and has been out running errands without adverse event, but is worried about it coming back because she's even been losing sleep occasionally due to the pain as it is worse when she lies on her side, including last night. She feels better when sitting upright and taking deep breaths. She states she saw her PCP who felt she had a viral infection causing inflammation of her chest cavity and was advised to get some sort of shot but she's very skeptical of this (?Toradol). She has no personal or family history of VTE. Pain is not worse with inspiration. She has no lower extremity edema or erythema. She is not tachycardic, tachypneic or hypoxic. Lipids followed by PCP with LDL of 60 in 12/2015. Of note, her podiatrist has ordered vascular studies on her legs  due to weak pedal pulses which is coming up later this month. She does report chronic cramping in her legs when she walks.   Past Medical History:  Diagnosis Date  . Chronic diastolic CHF (congestive heart failure) ([place])   . Coronary artery disease    a. she underwent a a 4V CABG  (LIMA to LAD, SVG to subbranch of ramus intermediate, SVG to lateral subbranch of ramus intermediate, SVG to distal RCA) and mitral valve repair January 2011. b. Abnormal nuc 09/2016 s/p  drug eluting stent to the vein graft to  the PDA, a drug eluting stent in the native Ramus intermediate branch and a drug eluting stent in the protected left main.  . Diabetes mellitus   . H/O emotional problems   . Headache, migraine   . High cholesterol   . History of blood transfusion   . Hypertension   . MRSA (methicillin resistant Staphylococcus aureus)   . MVP (mitral valve prolapse)   . Neuromuscular disorder ([place])   . Neuropathy   . S/P mitral valve repair   . Sebaceous cyst     Past Surgical History:  Procedure Laterality Date  . ABDOMINAL HYSTERECTOMY    . CARDIAC CATHETERIZATION  09/22/2016  . CORONARY ARTERY BYPASS GRAFT  with valve repair  . CORONARY STENT PLACEMENT  09/22/2016  . SKIN GRAFT      Current Medications: Current Meds  Medication Sig  . amLODipine (NORVASC) 5 MG tablet Take 5 mg by mouth 2 (two) times daily.  Marland Kitchen aspirin 81 MG tablet Take 81 mg by mouth daily.   . carvedilol (COREG) 12.5 MG tablet Take 12.5 mg by mouth 2 (two) times daily.  . clopidogrel (PLAVIX) 75 MG tablet Take 1 tablet (75 mg total) by mouth daily with breakfast.  . fluticasone (FLONASE) 50 MCG/ACT nasal spray Place 1 spray into both nostrils 2 (two) times daily.   . furosemide (LASIX) 20 MG tablet TAKE ONE TABLET BY MOUTH DAILY AS NEEDED FOR SWELLING  . insulin aspart (NOVOLOG FLEXPEN) 100 UNIT/ML injection Inject 0-12 Units into the skin See admin instructions. Per sliding scale Under 150 no units 150-199 take 8 units 200-249 take 10 units 250-299 take 12 units  . lisinopril (PRINIVIL,ZESTRIL) 20 MG tablet Take 20 mg by mouth daily.  . montelukast (SINGULAIR) 10 MG tablet Take 10 mg by mouth at bedtime.  . Naloxone HCl (EVZIO) 0.4 MG/0.4ML SOAJ Inject 0.4 mg as directed as needed (for possible overdose).   Marland Kitchen oxyCODONE-acetaminophen (PERCOCET) 10-325 MG per tablet Take 1 tablet by mouth 3 (three) times daily.  . rosuvastatin (CRESTOR) 10 MG tablet Take 10 mg by mouth every evening.   . Semaglutide (OZEMPIC) 0.25 or 0.5  MG/DOSE SOPN Inject 25 mg into the skin once a week. .47m x4 weeks, .562mx2 weeks  . SitaGLIPtin-MetFORMIN HCl (JANUMET XR) 50-1000 MG TB24 Take 1 tablet by mouth 2 (two) times daily.  . TRESIBA FLEXTOUCH 100 UNIT/ML SOPN FlexTouch Pen Inject 40 Units as directed at bedtime.   . [DISCONTINUED] gabapentin (NEURONTIN) 300 MG capsule Take 300 mg by mouth 2 (two) times daily.     Allergies:   Metformin   Social History   Socioeconomic History  . Marital status: Divorced    Spouse name: None  . Number of children: 2  . Years of education: None  . Highest education level: None  Social Needs  . Financial resource strain: None  . Food insecurity - worry: None  . Food insecurity -  inability: None  . Transportation needs - medical: None  . Transportation needs - non-medical: None  Occupational History  . Occupation: Disability  Tobacco Use  . Smoking status: Former Smoker    Packs/day: 1.00    Years: 28.00    Pack years: 28.00    Types: Cigarettes    Last attempt to quit: 03/31/2009    Years since quitting: 7.8  . Smokeless tobacco: Never Used  Substance and Sexual Activity  . Alcohol use: No  . Drug use: No  . Sexual activity: Yes    Birth control/protection: Surgical    Comment: partial hysterectomy  Other Topics Concern  . None  Social History Narrative  . None     Family History:  Family History  Problem Relation Age of Onset  . Heart disease Father   . Asthma Father   . Hypertension Father   . Diabetes Father   . Arthritis Father   . Heart disease Mother   . Hypertension Mother   . Diabetes Mother   . Migraines Mother   . Stroke Mother   . Emphysema Maternal Grandmother   . Hypertension Brother   . Diabetes Brother     ROS:   Please see the history of present illness.  All other systems are reviewed and otherwise negative.    PHYSICAL EXAM:   VS:  BP 116/68   Pulse 69   Resp 16   Ht 5' 4"  (1.626 m)   Wt 206 lb 12.8 oz (93.8 kg)   SpO2 90%   BMI  35.50 kg/m   BMI: Body mass index is 35.5 kg/m. GEN: Well nourished, well developed WF, in no acute distress  HEENT: normocephalic, atraumatic Neck: no JVD, carotid bruits, or masses Cardiac: RRR; no murmurs, rubs, or gallops, no edema, diminished pedal pulses bilaterally  Respiratory:  clear to auscultation bilaterally, normal work of breathing GI: soft, nontender, nondistended, + BS MS: no deformity or atrophy  Skin: warm and dry, no rash, no LE erythema or palpable cords Neuro:  Alert and Oriented x 3, Strength and sensation are intact, follows commands Psych: euthymic mood, full affect  Wt Readings from Last 3 Encounters:  02/06/17 206 lb 12.8 oz (93.8 kg)  01/10/17 209 lb (94.8 kg)  10/26/16 213 lb 13.5 oz (97 kg)    Studies/Labs Reviewed:   EKG:  EKG was ordered today and personally reviewed by me and demonstrates NSR 66bpm, low voltage QRS, poor R wave progression, nonspecific ST-T changes - mild upsloping noted inferiorly with minimal TWI I, avL. Changes appear similar to recent EKG from ED; which is subtly more pronounced than prior tracing 2016-09-30.  Recent Labs: 01/10/2017: BUN 16; Creatinine, Ser 0.83; Hemoglobin 14.4; Platelets 287; Potassium 4.3; Sodium 134   Lipid Panel   Additional studies/ records that were reviewed today include: Summarized above.    ASSESSMENT & PLAN:   1. CAD with chest pain - clinical picture not clear, mixed typical/atypical features. Recent ED visit muddles the picture with borderline troponin on second draw, no further trends noted. EKG nonacute but with subtle accentuation of prior changes from 10/01/22. She reports this is not similar to prior angina, with very specific positional component, but also has exertional component as well. She says her PCP pressed on her chest and it elicited pain, but not the same kind that wakes her up. Discussed with Dr. Angelena Form. Given the above and known complex history of CAD, will proceed with elective cath.  Risks and benefits  of cardiac catheterization have been discussed with the patient.  These include bleeding, infection, kidney damage, stroke, heart attack, death. The patient understands these risks and is willing to proceed. This has been arranged for Thursday 11/8 at noon. She has been pain free all day but we discussed warning signs/ER precautions to return to the emergency room for any recurrent or concerning symptoms in the interim. Will send off a stat troponin which we have requested be called to our on-call service. If positive, she should be called and instructed to proceed to ED. If negative will continue plan for cath on Thursday as discussed. Will also obtain pre-cath labs with BMET, PT/INR and CBC. For completeness will obtain LFTs, lipase to exclude GI/pancreatic abnormality (family history of this), and ESR/CRP given somewhat atypical positional nature of pain (?pericarditis). We have outlined diabetic med instructions on her paperwork (no Novolog day of cath, last dose of Janumet tomorrow AM, last dose of Tresiba night before cath). 2. History of mitral valve repair - will update echocardiogram to exclude any valvular issue contributing to symptoms. No new murmurs that I can appreciate. Reviewed SBE prophylaxis. 3. Chronic diastolic CHF - does not appear volume overloaded. Weight actually down. Follow. 4. HTN - blood pressure well controlled. 5. Hyperlipidemia - continue statin. Consideration of titration will be at the discretion of Dr. Angelena Form.  Disposition: F/u with Dr. McAlhany/care team APP 1 week after cath. Sent message to scheduling pool.   Medication Adjustments/Labs and Tests Ordered: Current medicines are reviewed at length with the patient today.  Concerns regarding medicines are outlined above. Medication changes, Labs and Tests ordered today are summarized above and listed in the Patient Instructions accessible in Encounters.   Signed, Elizabeth Pitter, PA-C  02/06/2017 3:30  PM    [facility] [address], [place], [place]  [address] Phone: [phone]; Fax: [phone]

## 2017-02-07 ENCOUNTER — Other Ambulatory Visit (HOSPITAL_COMMUNITY): Payer: Self-pay

## 2017-02-07 ENCOUNTER — Encounter (HOSPITAL_COMMUNITY)
Admission: EM | Disposition: A | Discharge status: Home / Self Care | Payer: Self-pay | Source: Home / Self Care | Attending: Cardiology

## 2017-02-07 ENCOUNTER — Other Ambulatory Visit: Payer: Self-pay

## 2017-02-07 DIAGNOSIS — R079 Chest pain, unspecified: Secondary | ICD-10-CM | POA: Diagnosis not present

## 2017-02-07 DIAGNOSIS — Z90710 Acquired absence of both cervix and uterus: Secondary | ICD-10-CM | POA: Diagnosis not present

## 2017-02-07 DIAGNOSIS — I252 Old myocardial infarction: Secondary | ICD-10-CM | POA: Diagnosis not present

## 2017-02-07 DIAGNOSIS — E1142 Type 2 diabetes mellitus with diabetic polyneuropathy: Secondary | ICD-10-CM | POA: Diagnosis not present

## 2017-02-07 DIAGNOSIS — I25110 Atherosclerotic heart disease of native coronary artery with unstable angina pectoris: Secondary | ICD-10-CM | POA: Diagnosis not present

## 2017-02-07 DIAGNOSIS — E785 Hyperlipidemia, unspecified: Secondary | ICD-10-CM | POA: Diagnosis not present

## 2017-02-07 DIAGNOSIS — Z87891 Personal history of nicotine dependence: Secondary | ICD-10-CM | POA: Diagnosis not present

## 2017-02-07 DIAGNOSIS — Z79891 Long term (current) use of opiate analgesic: Secondary | ICD-10-CM | POA: Diagnosis not present

## 2017-02-07 DIAGNOSIS — Z888 Allergy status to other drugs, medicaments and biological substances status: Secondary | ICD-10-CM | POA: Diagnosis not present

## 2017-02-07 DIAGNOSIS — Z951 Presence of aortocoronary bypass graft: Secondary | ICD-10-CM | POA: Diagnosis not present

## 2017-02-07 DIAGNOSIS — I5032 Chronic diastolic (congestive) heart failure: Secondary | ICD-10-CM | POA: Diagnosis not present

## 2017-02-07 DIAGNOSIS — Z7951 Long term (current) use of inhaled steroids: Secondary | ICD-10-CM | POA: Diagnosis not present

## 2017-02-07 DIAGNOSIS — Z794 Long term (current) use of insulin: Secondary | ICD-10-CM | POA: Diagnosis not present

## 2017-02-07 DIAGNOSIS — N6019 Diffuse cystic mastopathy of unspecified breast: Secondary | ICD-10-CM | POA: Diagnosis not present

## 2017-02-07 DIAGNOSIS — I110 Hypertensive heart disease with heart failure: Secondary | ICD-10-CM | POA: Diagnosis not present

## 2017-02-07 DIAGNOSIS — I25810 Atherosclerosis of coronary artery bypass graft(s) without angina pectoris: Secondary | ICD-10-CM | POA: Diagnosis not present

## 2017-02-07 DIAGNOSIS — R748 Abnormal levels of other serum enzymes: Secondary | ICD-10-CM | POA: Diagnosis not present

## 2017-02-07 DIAGNOSIS — Z833 Family history of diabetes mellitus: Secondary | ICD-10-CM | POA: Diagnosis not present

## 2017-02-07 DIAGNOSIS — R9439 Abnormal result of other cardiovascular function study: Secondary | ICD-10-CM | POA: Diagnosis not present

## 2017-02-07 DIAGNOSIS — Z79899 Other long term (current) drug therapy: Secondary | ICD-10-CM | POA: Diagnosis not present

## 2017-02-07 DIAGNOSIS — Z955 Presence of coronary angioplasty implant and graft: Secondary | ICD-10-CM | POA: Diagnosis not present

## 2017-02-07 DIAGNOSIS — I080 Rheumatic disorders of both mitral and aortic valves: Secondary | ICD-10-CM | POA: Diagnosis not present

## 2017-02-07 DIAGNOSIS — Z7902 Long term (current) use of antithrombotics/antiplatelets: Secondary | ICD-10-CM | POA: Diagnosis not present

## 2017-02-07 DIAGNOSIS — Z8249 Family history of ischemic heart disease and other diseases of the circulatory system: Secondary | ICD-10-CM | POA: Diagnosis not present

## 2017-02-07 DIAGNOSIS — Z952 Presence of prosthetic heart valve: Secondary | ICD-10-CM | POA: Diagnosis not present

## 2017-02-07 DIAGNOSIS — I249 Acute ischemic heart disease, unspecified: Secondary | ICD-10-CM | POA: Diagnosis not present

## 2017-02-07 DIAGNOSIS — Z7982 Long term (current) use of aspirin: Secondary | ICD-10-CM | POA: Diagnosis not present

## 2017-02-07 HISTORY — PX: CORONARY STENT INTERVENTION: CATH118234

## 2017-02-07 HISTORY — PX: LEFT HEART CATH AND CORS/GRAFTS ANGIOGRAPHY: CATH118250

## 2017-02-07 LAB — LIPASE: Lipase: 62 U/L (ref 14–72)

## 2017-02-07 LAB — COMPREHENSIVE METABOLIC PANEL
ALT: 16 IU/L (ref 0–32)
AST: 19 IU/L (ref 0–40)
Albumin/Globulin Ratio: 1.5 (ref 1.2–2.2)
Albumin: 4.8 g/dL (ref 3.5–5.5)
Alkaline Phosphatase: 40 IU/L (ref 39–117)
BUN/Creatinine Ratio: 20 (ref 9–23)
BUN: 15 mg/dL (ref 6–24)
Bilirubin Total: 0.3 mg/dL (ref 0.0–1.2)
CO2: 23 mmol/L (ref 20–29)
Calcium: 10.1 mg/dL (ref 8.7–10.2)
Chloride: 98 mmol/L (ref 96–106)
Creatinine, Ser: 0.75 mg/dL (ref 0.57–1.00)
GFR calc Af Amer: 109 mL/min/{1.73_m2} (ref >59)
GFR calc non Af Amer: 95 mL/min/{1.73_m2} (ref >59)
Globulin, Total: 3.1 g/dL (ref 1.5–4.5)
Glucose: 133 mg/dL — ABNORMAL HIGH (ref 65–99)
Potassium: 4.5 mmol/L (ref 3.5–5.2)
Sodium: 137 mmol/L (ref 134–144)
Total Protein: 7.9 g/dL (ref 6.0–8.5)

## 2017-02-07 LAB — BASIC METABOLIC PANEL
Anion gap: 8 (ref 5–15)
BUN: 15 mg/dL (ref 6–20)
CO2: 25 mmol/L (ref 22–32)
Calcium: 9.7 mg/dL (ref 8.9–10.3)
Chloride: 103 mmol/L (ref 101–111)
Creatinine, Ser: 0.8 mg/dL (ref 0.44–1.00)
GFR calc Af Amer: >60 mL/min (ref >60)
GFR calc non Af Amer: >60 mL/min (ref >60)
Glucose, Bld: 138 mg/dL — ABNORMAL HIGH (ref 65–99)
Potassium: 4.6 mmol/L (ref 3.5–5.1)
Sodium: 136 mmol/L (ref 135–145)

## 2017-02-07 LAB — C-REACTIVE PROTEIN: CRP: 6.8 mg/L — ABNORMAL HIGH (ref 0.0–4.9)

## 2017-02-07 LAB — HIV ANTIBODY (ROUTINE TESTING W REFLEX): HIV Screen 4th Generation wRfx: NONREACTIVE

## 2017-02-07 LAB — POCT ACTIVATED CLOTTING TIME: Activated Clotting Time: 296 seconds

## 2017-02-07 LAB — TROPONIN I
Troponin I: 0.06 ng/mL (ref 0.00–0.04)
Troponin I: 0.05 ng/mL (ref <0.03)

## 2017-02-07 LAB — CBC
Hematocrit: 41.4 % (ref 34.0–46.6)
Hemoglobin: 14.3 g/dL (ref 11.1–15.9)
MCH: 31.4 pg (ref 26.6–33.0)
MCHC: 34.5 g/dL (ref 31.5–35.7)
MCV: 91 fL (ref 79–97)
Platelets: 316 10*3/uL (ref 150–379)
RBC: 4.55 x10E6/uL (ref 3.77–5.28)
RDW: 13.4 % (ref 12.3–15.4)
WBC: 9.1 10*3/uL (ref 3.4–10.8)

## 2017-02-07 LAB — HEPARIN LEVEL (UNFRACTIONATED)
Heparin Unfractionated: 0.51 IU/mL (ref 0.30–0.70)
Heparin Unfractionated: 0.42 IU/mL (ref 0.30–0.70)

## 2017-02-07 LAB — PROTIME-INR
INR: 1 (ref 0.8–1.2)
INR: 1.1
Prothrombin Time: 10.5 s (ref 9.1–12.0)
Prothrombin Time: 14.1 seconds (ref 11.4–15.2)

## 2017-02-07 LAB — MRSA PCR SCREENING: MRSA by PCR: NEGATIVE

## 2017-02-07 LAB — GLUCOSE, CAPILLARY: Glucose-Capillary: 95 mg/dL (ref 65–99)

## 2017-02-07 LAB — SEDIMENTATION RATE: Sed Rate: 12 mm/hr (ref 0–32)

## 2017-02-07 SURGERY — LEFT HEART CATH AND CORS/GRAFTS ANGIOGRAPHY
Anesthesia: LOCAL

## 2017-02-07 MED ORDER — SODIUM CHLORIDE 0.9 % WEIGHT BASED INFUSION
1.0000 mL/kg/h | INTRAVENOUS | Status: DC
  Administered 2017-02-07: 250 mL via INTRAVENOUS
  Administered 2017-02-07: 1 mL/kg/h via INTRAVENOUS

## 2017-02-07 MED ORDER — FENTANYL CITRATE (PF) 100 MCG/2ML IJ SOLN
INTRAMUSCULAR | Status: DC | PRN
  Administered 2017-02-07 (×3): 25 ug via INTRAVENOUS

## 2017-02-07 MED ORDER — SODIUM CHLORIDE 0.9% FLUSH: 3.0000 mL | Freq: Two times a day (BID) | INTRAVENOUS | Status: DC

## 2017-02-07 MED ORDER — ASPIRIN 300 MG RE SUPP: 300.0000 mg | RECTAL | Status: DC

## 2017-02-07 MED ORDER — ASPIRIN 81 MG PO TABS: 81.0000 mg | ORAL_TABLET | Freq: Every day | ORAL | Status: DC

## 2017-02-07 MED ORDER — HEPARIN SODIUM (PORCINE) 1000 UNIT/ML IJ SOLN
INTRAMUSCULAR | Status: DC | PRN
  Administered 2017-02-07 (×2): 5000 [IU] via INTRAVENOUS

## 2017-02-07 MED ORDER — ASPIRIN 81 MG PO CHEW
324.0000 mg | CHEWABLE_TABLET | Freq: Once | ORAL | Status: AC
  Administered 2017-02-07: 324 mg via ORAL
  Filled 2017-02-07: qty 4

## 2017-02-07 MED ORDER — SODIUM CHLORIDE 0.9% FLUSH: 3.0000 mL | INTRAVENOUS | Status: DC | PRN

## 2017-02-07 MED ORDER — ASPIRIN 81 MG PO CHEW: 81.0000 mg | CHEWABLE_TABLET | ORAL | Status: DC

## 2017-02-07 MED ORDER — HEPARIN BOLUS VIA INFUSION
4000.0000 [IU] | Freq: Once | INTRAVENOUS | Status: AC
  Administered 2017-02-07: 4000 [IU] via INTRAVENOUS
  Filled 2017-02-07: qty 4000

## 2017-02-07 MED ORDER — CARVEDILOL 12.5 MG PO TABS
12.5000 mg | ORAL_TABLET | Freq: Two times a day (BID) | ORAL | Status: DC
  Administered 2017-02-07: 12.5 mg via ORAL
  Filled 2017-02-07: qty 1

## 2017-02-07 MED ORDER — ONDANSETRON HCL 4 MG/2ML IJ SOLN
4.0000 mg | Freq: Four times a day (QID) | INTRAMUSCULAR | Status: DC | PRN
  Administered 2017-02-07: 4 mg via INTRAVENOUS
  Filled 2017-02-07: qty 2

## 2017-02-07 MED ORDER — NITROGLYCERIN 1 MG/10 ML FOR IR/CATH LAB
INTRA_ARTERIAL | Status: AC
  Filled 2017-02-07: qty 10

## 2017-02-07 MED ORDER — SODIUM CHLORIDE 0.9% FLUSH
3.0000 mL | Freq: Two times a day (BID) | INTRAVENOUS | Status: DC
Start: 2017-02-07 — End: 2017-02-07

## 2017-02-07 MED ORDER — NITROGLYCERIN 0.4 MG SL SUBL: 0.4000 mg | SUBLINGUAL_TABLET | SUBLINGUAL | Status: DC | PRN

## 2017-02-07 MED ORDER — HEPARIN (PORCINE) IN NACL 2-0.9 UNIT/ML-% IJ SOLN
INTRAMUSCULAR | Status: DC | PRN
  Administered 2017-02-07 (×2): 10 mL via INTRA_ARTERIAL

## 2017-02-07 MED ORDER — HEPARIN SODIUM (PORCINE) 1000 UNIT/ML IJ SOLN
INTRAMUSCULAR | Status: AC
  Filled 2017-02-07: qty 1

## 2017-02-07 MED ORDER — ASPIRIN EC 81 MG PO TBEC
81.0000 mg | DELAYED_RELEASE_TABLET | Freq: Every day | ORAL | Status: DC
  Administered 2017-02-08: 10:00:00 81 mg via ORAL
  Filled 2017-02-07: qty 1

## 2017-02-07 MED ORDER — HEPARIN (PORCINE) IN NACL 2-0.9 UNIT/ML-% IJ SOLN
INTRAMUSCULAR | Status: AC
  Filled 2017-02-07: qty 1000

## 2017-02-07 MED ORDER — ASPIRIN 81 MG PO CHEW
81.0000 mg | CHEWABLE_TABLET | ORAL | Status: AC
  Administered 2017-02-07: 81 mg via ORAL
  Filled 2017-02-07: qty 1

## 2017-02-07 MED ORDER — MIDAZOLAM HCL 2 MG/2ML IJ SOLN
INTRAMUSCULAR | Status: AC
  Filled 2017-02-07: qty 2

## 2017-02-07 MED ORDER — ASPIRIN 81 MG PO CHEW: 324.0000 mg | CHEWABLE_TABLET | ORAL | Status: DC

## 2017-02-07 MED ORDER — HEPARIN (PORCINE) IN NACL 100-0.45 UNIT/ML-% IJ SOLN
1000.0000 [IU]/h | INTRAMUSCULAR | Status: DC
  Administered 2017-02-07: 1000 [IU]/h via INTRAVENOUS
  Filled 2017-02-07: qty 250

## 2017-02-07 MED ORDER — ROSUVASTATIN CALCIUM 10 MG PO TABS
10.0000 mg | ORAL_TABLET | Freq: Every evening | ORAL | Status: DC
  Administered 2017-02-07: 10 mg via ORAL
  Filled 2017-02-07: qty 1

## 2017-02-07 MED ORDER — FUROSEMIDE 20 MG PO TABS
10.0000 mg | ORAL_TABLET | Freq: Every day | ORAL | Status: DC
  Administered 2017-02-07 – 2017-02-08 (×2): 10 mg via ORAL
  Filled 2017-02-07 (×2): qty 1

## 2017-02-07 MED ORDER — AMLODIPINE BESYLATE 5 MG PO TABS
5.0000 mg | ORAL_TABLET | Freq: Two times a day (BID) | ORAL | Status: DC
  Administered 2017-02-07 (×2): 5 mg via ORAL
  Filled 2017-02-07 (×2): qty 1

## 2017-02-07 MED ORDER — FLUTICASONE PROPIONATE 50 MCG/ACT NA SUSP
1.0000 | Freq: Two times a day (BID) | NASAL | Status: DC
  Administered 2017-02-07 – 2017-02-08 (×2): 1 via NASAL
  Filled 2017-02-07 (×2): qty 16

## 2017-02-07 MED ORDER — IOPAMIDOL (ISOVUE-370) INJECTION 76%
INTRAVENOUS | Status: DC | PRN
  Administered 2017-02-07: 105 mL via INTRA_ARTERIAL

## 2017-02-07 MED ORDER — FENTANYL CITRATE (PF) 100 MCG/2ML IJ SOLN
INTRAMUSCULAR | Status: AC
  Filled 2017-02-07: qty 2

## 2017-02-07 MED ORDER — SODIUM CHLORIDE 0.9 % IV SOLN: 250.0000 mL | INTRAVENOUS | Status: DC | PRN

## 2017-02-07 MED ORDER — VERAPAMIL HCL 2.5 MG/ML IV SOLN
INTRAVENOUS | Status: AC
  Filled 2017-02-07: qty 2

## 2017-02-07 MED ORDER — SODIUM CHLORIDE 0.9 % WEIGHT BASED INFUSION
3.0000 mL/kg/h | INTRAVENOUS | Status: DC
  Administered 2017-02-07: 3 mL/kg/h via INTRAVENOUS

## 2017-02-07 MED ORDER — PREGABALIN 25 MG PO CAPS
75.0000 mg | ORAL_CAPSULE | Freq: Two times a day (BID) | ORAL | Status: DC
Start: 2017-02-07 — End: 2017-02-08
  Administered 2017-02-07: 75 mg via ORAL
  Filled 2017-02-07: qty 3
  Filled 2017-02-07: qty 1
  Filled 2017-02-07: qty 3

## 2017-02-07 MED ORDER — MONTELUKAST SODIUM 10 MG PO TABS
10.0000 mg | ORAL_TABLET | Freq: Every day | ORAL | Status: DC
  Administered 2017-02-07 (×2): 10 mg via ORAL
  Filled 2017-02-07 (×2): qty 1

## 2017-02-07 MED ORDER — MIDAZOLAM HCL 2 MG/2ML IJ SOLN
INTRAMUSCULAR | Status: DC | PRN
  Administered 2017-02-07: 1 mg via INTRAVENOUS
  Administered 2017-02-07: 2 mg via INTRAVENOUS
  Administered 2017-02-07: 1 mg via INTRAVENOUS

## 2017-02-07 MED ORDER — ACETAMINOPHEN 325 MG PO TABS
650.0000 mg | ORAL_TABLET | ORAL | Status: DC | PRN
Start: 2017-02-07 — End: 2017-02-08
  Administered 2017-02-07: 650 mg via ORAL
  Filled 2017-02-07: qty 2

## 2017-02-07 MED ORDER — SODIUM CHLORIDE 0.9 % WEIGHT BASED INFUSION: 3.0000 mL/kg/h | INTRAVENOUS | Status: DC

## 2017-02-07 MED ORDER — LIDOCAINE HCL (PF) 1 % IJ SOLN
INTRAMUSCULAR | Status: DC | PRN
  Administered 2017-02-07: 2 mL via INTRADERMAL

## 2017-02-07 MED ORDER — INSULIN ASPART 100 UNIT/ML ~~LOC~~ SOLN
0.0000 [IU] | Freq: Three times a day (TID) | SUBCUTANEOUS | Status: DC
  Administered 2017-02-08 (×2): 2 [IU] via SUBCUTANEOUS

## 2017-02-07 MED ORDER — SODIUM CHLORIDE 0.9 % WEIGHT BASED INFUSION: 1.0000 mL/kg/h | INTRAVENOUS | Status: DC

## 2017-02-07 MED ORDER — CLOPIDOGREL BISULFATE 75 MG PO TABS
75.0000 mg | ORAL_TABLET | Freq: Every day | ORAL | Status: DC
  Administered 2017-02-07 – 2017-02-08 (×2): 75 mg via ORAL
  Filled 2017-02-07 (×2): qty 1

## 2017-02-07 MED ORDER — NITROGLYCERIN 0.4 MG SL SUBL
0.4000 mg | SUBLINGUAL_TABLET | SUBLINGUAL | Status: DC | PRN
  Administered 2017-02-07: 0.4 mg via SUBLINGUAL
  Filled 2017-02-07: qty 1

## 2017-02-07 MED ORDER — ANGIOPLASTY BOOK
Freq: Once | Status: AC
  Administered 2017-02-07: 23:00:00
  Filled 2017-02-07: qty 1

## 2017-02-07 MED ORDER — LISINOPRIL 10 MG PO TABS
20.0000 mg | ORAL_TABLET | Freq: Every day | ORAL | Status: DC
  Administered 2017-02-07: 20 mg via ORAL
  Filled 2017-02-07: qty 1

## 2017-02-07 MED ORDER — LIDOCAINE HCL (PF) 1 % IJ SOLN
INTRAMUSCULAR | Status: AC
  Filled 2017-02-07: qty 30

## 2017-02-07 MED ORDER — HEPARIN (PORCINE) IN NACL 2-0.9 UNIT/ML-% IJ SOLN
INTRAMUSCULAR | Status: AC | PRN
  Administered 2017-02-07: 1000 mL

## 2017-02-07 MED ORDER — IOPAMIDOL (ISOVUE-370) INJECTION 76%
INTRAVENOUS | Status: AC
  Filled 2017-02-07: qty 125

## 2017-02-07 MED ORDER — SODIUM CHLORIDE 0.9 % WEIGHT BASED INFUSION
1.0000 mL/kg/h | INTRAVENOUS | Status: AC
  Administered 2017-02-07: 20:00:00 1 mL/kg/h via INTRAVENOUS

## 2017-02-07 SURGICAL SUPPLY — 21 items
BALLN EMERGE MR 2.0X12 (BALLOONS) ×2
BALLN ~~LOC~~ EMERGE MR 2.75X12 (BALLOONS) ×2
BALLOON EMERGE MR 2.0X12 (BALLOONS) ×1 IMPLANT
BALLOON ~~LOC~~ EMERGE MR 2.75X12 (BALLOONS) ×1 IMPLANT
CATH 5FR JL3.5 JR4 ANG PIG MP (CATHETERS) ×2 IMPLANT
CATH EXPO 5F MPA-1 (CATHETERS) ×2 IMPLANT
CATH INFINITI 5FR AL1 (CATHETERS) ×2 IMPLANT
CATH LAUNCHER 5F EBU3.5 (CATHETERS) ×2 IMPLANT
CATH LAUNCHER 6FR EBU3.5 (CATHETERS) ×2 IMPLANT
DEVICE RAD COMP TR BAND LRG (VASCULAR PRODUCTS) ×2 IMPLANT
GLIDESHEATH SLEND SS 6F .021 (SHEATH) ×2 IMPLANT
GUIDEWIRE INQWIRE 1.5J.035X260 (WIRE) ×1 IMPLANT
INQWIRE 1.5J .035X260CM (WIRE) ×2
KIT ENCORE 26 ADVANTAGE (KITS) ×2 IMPLANT
KIT HEART LEFT (KITS) ×2 IMPLANT
PACK CARDIAC CATHETERIZATION (CUSTOM PROCEDURE TRAY) ×2 IMPLANT
STENT RESOLUTE ONYX 2.5X15 (Permanent Stent) ×2 IMPLANT
TRANSDUCER W/STOPCOCK (MISCELLANEOUS) ×2 IMPLANT
TUBING CIL FLEX 10 FLL-RA (TUBING) ×2 IMPLANT
WIRE ASAHI PROWATER 180CM (WIRE) ×2 IMPLANT
WIRE HI TORQ VERSACORE-J 145CM (WIRE) ×2 IMPLANT

## 2017-02-07 NOTE — H&P (Addendum)
Cardiology Admission History and Physical:   Patient ID: Elizabeth Park; MRN: [id number]; DOB: 19-Sep-1970   Admission date: 02/06/2017  Primary Care Provider: Lucianne Lei, MD Primary Cardiologist:  Primary Electrophysiologist:    Chief Complaint:   Patient Profile:   Elizabeth Park is a 46 y.o. female with a history of CHEST PAIN, RESOLVED, now  Presented  Due to abnormal  Troponin  From  [facility], called  In  Critical  Labs Lucianne Lei, MD           Cardiologist: Dr. Angelena Form   Chief Complaint: f/u chest pain  History of Present Illness:  Elizabeth Park is a 46 y.o. female with history of CAD s/p 4VCABG 2011 and mitral valve repair for severe mitral regurgitation, DESx3 in 09/2016, DM, HTN, HLD, former tobacco abuse, chronic diastolic CHF who presents for follow-up.  To recap, cardiac cath 04/01/09 performed by Dr. 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 She also had severe mitral regurgitation. She underwent a a 4V CABG  (LIMA to LAD, SVG to subbranch of ramus intermediate, SVG to lateral subbranch of ramus intermediate, SVG to distal RCA) and mitral valve repair January 2011. She had chest pain in June 2018 that felt unlike prior angina; her nuclear stress test suggested ischemia. She underwent cardiac cath on 09/22/16 which showed progression of her CAD with resultant drug eluting stent to the vein graft to the PDA, a drug eluting stent in the native Ramus intermediate branch and a drug eluting stent in the protected left main. LVEF was normal. Last echo in 2016 was technically difficult, EF 55-60%, grade 1 DD, mildly reduced RV function, s/p mitral valve repair with no significant MR, possible mild mitral stenosis. Last OV with Dr. Angelena Form 10/2016 at which time he prescribed PRN Lasix for intermittent LE edema given prior elevated filling pressures on cath.   She was seen in the ED  01/10/17 with chest discomfort with strange pain in her jaw. It woke her out of a dead sleep and felt like a sharp pain in the middle of her chest going to her back. It lasted about an hour. Due to persistent discomfort she proceeded to the ED. Per ER note, by time of eval she had actually been pain free for a while. Initial troponin was negative, second troponin was borderline at 0.03. No further values trended. EKG with nonspecific changes, felt nonacute. She was discharged home with plan for OP follow-up. Otherwise labs showed Hgb 14.4, K 4.3, Cr 0.83. Since that time she's continued to have chest pain almost every day, sometimes several times a day. It hurts down her back and arms. It lasts for several minutes up to 30 minutes. It is also brought on sometimes with exertion. She was unable to go as far as she usually does in cardiac rehab due to this and was asked to make a f/u appt here. She's also noticed it at times while out and about walking longer distances. She says today she actually feels fine and has been out running errands without adverse event, but is worried about it coming back because she's even been losing sleep occasionally due to the pain as it is worse when she lies on her side, including last night. She feels better when sitting upright and taking deep breaths. She states she saw her PCP who felt she had a viral infection causing inflammation of her chest  cavity and was advised to get some sort of shot but she's very skeptical of this (?Toradol). She has no personal or family history of VTE. Pain is not worse with inspiration. She has no lower extremity edema or erythema. She is not tachycardic, tachypneic or hypoxic. Lipids followed by PCP with LDL of 60 in 12/2015. Of note, her podiatrist has ordered vascular studies on her legs due to weak pedal pulses which is coming up later this month. She does report chronic cramping in her legs when she walks.     History of Present Illness:    Ms.  HPI Elizabeth Park is a 46 y.o. female.  HPI  This is a 46 year old female with a history of heart failure, diabetes, extensive coronary artery disease status post bypass and most recently 3 drug-eluting stents placed in June 2018 who presents with chest pain and a positive troponin from cardiology office.  Patient states of the last month she has had recurrent anterior chest pain.  It is exertional but also worse with food and other factors.  She has developed worsening shortness of breath.  She saw her primary physician and followed up with her cardiologist yesterday.  Story was thought to be atypical; however, given her history of heart disease, lab work was obtained.  She received a call that her troponin was elevated at 0.06.  She has been pain-free for many hours without recurrence of her symptoms.  She currently denies any symptoms.        Past Medical History:  Diagnosis Date  . Chronic diastolic CHF (congestive heart failure) ([place])   . Coronary artery disease    a. she underwent a a 4V CABG  (LIMA to LAD, SVG to subbranch of ramus intermediate, SVG to lateral subbranch of ramus intermediate, SVG to distal RCA) and mitral valve repair January 2011. b. Abnormal nuc 09/2016 s/p  drug eluting stent to the vein graft to the PDA, a drug eluting stent in the native Ramus intermediate branch and a drug eluting stent in the protected left main.  . Diabetes mellitus   . H/O emotional problems   . Headache, migraine   . High cholesterol   . History of blood transfusion   . Hypertension   . MI, old   . MRSA (methicillin resistant Staphylococcus aureus)   . MVP (mitral valve prolapse)   . Neuromuscular disorder ([place])   . Neuropathy   . S/P mitral valve repair   . Sebaceous cyst         Patient Active Problem List   Diagnosis Date Noted  . Unstable angina ([place])   . Follow-up examination, following unspecified surgery 01/29/2012  . S/P mitral valve repair  11/05/2011  . TOBACCO USE, QUIT 10/15/2009  . PERIPHERAL NEUROPATHY, FEET 09/02/2009  . CAD, ARTERY BYPASS GRAFT 05/11/2009  . MITRAL REGURGITATION 04/28/2009  . Hyperlipidemia 04/26/2009  . Essential hypertension 04/26/2009  . SEBACEOUS CYST 10/09/2008  . Type 2 diabetes mellitus with complication ([place]) 78/29/5621  . TOBACCO ABUSE 04/10/2008  . FIBROCYSTIC BREAST DISEASE 04/10/2008         Past Surgical History:  Procedure Laterality Date  . ABDOMINAL HYSTERECTOMY    . CARDIAC CATHETERIZATION  09/22/2016  . CORONARY ARTERY BYPASS GRAFT  with valve repair  . CORONARY STENT PLACEMENT  09/22/2016  . SKIN GRAFT              OB History    Gravida Para Term Preterm AB Living   2  2 2     2    SAB TAB Ectopic Multiple Live Births                   Home Medications                      Prior to Admission medications   Medication Sig Start Date End Date Taking? Authorizing Provider  amLODipine (NORVASC) 5 MG tablet Take 5 mg by mouth 2 (two) times daily.   Yes [provider]  aspirin 81 MG tablet Take 81 mg by mouth daily.  11/02/11  Yes Hillary Bow, MD  carvedilol (COREG) 12.5 MG tablet Take 12.5 mg by mouth 2 (two) times daily. 12/01/15  Yes [provider]  clopidogrel (PLAVIX) 75 MG tablet Take 1 tablet (75 mg total) by mouth daily with breakfast. 09/24/16  Yes Eileen Stanford, PA-C  fluticasone (FLONASE) 50 MCG/ACT nasal spray Place 1 spray into both nostrils 2 (two) times daily.    Yes [provider]  furosemide (LASIX) 20 MG tablet TAKE ONE TABLET BY MOUTH DAILY AS NEEDED FOR SWELLING 01/15/17  Yes Burnell Blanks, MD  insulin aspart (NOVOLOG FLEXPEN) 100 UNIT/ML injection Inject 0-12 Units into the skin See admin instructions. Per sliding scale Under 150 no units 150-199 take 8 units 200-249 take 10 units 250-299 take 12 units   Yes [provider]  lisinopril (PRINIVIL,ZESTRIL) 20 MG  tablet Take 20 mg by mouth daily.   Yes [provider]  LYRICA CR 165 MG TB24 Take 165 mg daily by mouth.  01/29/17  Yes [provider]  montelukast (SINGULAIR) 10 MG tablet Take 10 mg by mouth at bedtime.   Yes [provider]  Naloxone HCl (EVZIO) 0.4 MG/0.4ML SOAJ Inject 0.4 mg as directed as needed (for possible overdose).    Yes [provider]  nitroGLYCERIN (NITROSTAT) 0.4 MG SL tablet Place 1 tablet (0.4 mg total) under the tongue every 5 (five) minutes as needed for chest pain. 10/27/16 02/07/22 Yes Burnell Blanks, MD  oxyCODONE-acetaminophen (PERCOCET) 10-325 MG per tablet Take 1 tablet by mouth 3 (three) times daily.   Yes [provider]  rosuvastatin (CRESTOR) 10 MG tablet Take 10 mg by mouth every evening.  12/05/15  Yes [provider]  Semaglutide (OZEMPIC) 0.25 or 0.5 MG/DOSE SOPN Inject 25 mg once a week into the skin. On Tuesday   Yes [provider]  SitaGLIPtin-MetFORMIN HCl (JANUMET XR) 50-1000 MG TB24 Take 1 tablet by mouth 2 (two) times daily.   Yes [provider]  TRESIBA FLEXTOUCH 100 UNIT/ML SOPN FlexTouch Pen Inject 40 Units as directed at bedtime.  08/13/16  Yes [provider]    Family History Family History  Problem Relation Age of Onset  . Heart disease Father   . Asthma Father   . Hypertension Father   . Diabetes Father   . Arthritis Father   . Heart disease Mother   . Hypertension Mother   . Diabetes Mother   . Migraines Mother   . Stroke Mother   . Emphysema Maternal Grandmother   . Hypertension Brother   . Diabetes Brother     Social History Social History        Tobacco Use  . Smoking status: Former Smoker    Packs/day: 1.00    Years: 28.00    Pack years: 28.00    Types: Cigarettes    Last attempt to  quit: 03/31/2009    Years since quitting: 7.8  . Smokeless tobacco: Never Used  Substance Use Topics  .  Alcohol use: No  . Drug use: No     Allergies              Metformin   Review of Systems Review of Systems  Constitutional: Negative for fever.  Respiratory: Positive for shortness of breath.   Cardiovascular: Positive for chest pain. Negative for leg swelling.  Gastrointestinal: Negative for abdominal pain, nausea and vomiting.  Genitourinary: Negative for dysuria.  All other systems reviewed and are negative.    Physical Exam Updated Vital Signs BP 108/62   Pulse 72   Temp 98.2 F (36.8 C) (Oral)   Resp 18   SpO2 98%   Physical Exam  Constitutional: She is oriented to person, place, and time. She appears well-developed and well-nourished.  HENT:  Head: Normocephalic and atraumatic.  Eyes: Pupils are equal, round, and reactive to light.  Neck: Neck supple.  Cardiovascular: Normal rate, regular rhythm and normal pulses.  Murmur heard. Pulmonary/Chest: Effort normal. No respiratory distress. She has no wheezes.  Midline sternotomy scar  Abdominal: Soft. Bowel sounds are normal.  Neurological: She is alert and oriented to person, place, and time.  Skin: Skin is warm and dry.  Psychiatric: She has a normal mood and affect.  Nursing note and vitals reviewed.     Past Medical History:  Diagnosis Date  . Chronic diastolic CHF (congestive heart failure) ([place])   . Coronary artery disease    a. she underwent a a 4V CABG  (LIMA to LAD, SVG to subbranch of ramus intermediate, SVG to lateral subbranch of ramus intermediate, SVG to distal RCA) and mitral valve repair January 2011. b. Abnormal nuc 09/2016 s/p  drug eluting stent to the vein graft to the PDA, a drug eluting stent in the native Ramus intermediate branch and a drug eluting stent in the protected left main.  . Diabetes mellitus   . H/O emotional problems   . Headache, migraine   . High cholesterol   . History of blood transfusion   . Hypertension   . MI, old   . MRSA (methicillin resistant  Staphylococcus aureus)   . MVP (mitral valve prolapse)   . Neuromuscular disorder ([place])   . Neuropathy   . S/P mitral valve repair   . Sebaceous cyst     Past Surgical History:  Procedure Laterality Date  . ABDOMINAL HYSTERECTOMY    . CARDIAC CATHETERIZATION  09/22/2016  . CORONARY ARTERY BYPASS GRAFT  with valve repair  . CORONARY STENT PLACEMENT  09/22/2016  . SKIN GRAFT       Medications Prior to Admission: Prior to Admission medications   Medication Sig Start Date End Date Taking? Authorizing Provider  amLODipine (NORVASC) 5 MG tablet Take 5 mg by mouth 2 (two) times daily.   Yes [provider]  aspirin 81 MG tablet Take 81 mg by mouth daily.  11/02/11  Yes Hillary Bow, MD  carvedilol (COREG) 12.5 MG tablet Take 12.5 mg by mouth 2 (two) times daily. 12/01/15  Yes [provider]  clopidogrel (PLAVIX) 75 MG tablet Take 1 tablet (75 mg total) by mouth daily with breakfast. 09/24/16  Yes Eileen Stanford, PA-C  fluticasone (FLONASE) 50 MCG/ACT nasal spray Place 1 spray into both nostrils 2 (two) times daily.    Yes [provider]  furosemide (LASIX) 20 MG tablet TAKE ONE TABLET BY MOUTH  DAILY AS NEEDED FOR SWELLING 01/15/17  Yes Burnell Blanks, MD  insulin aspart (NOVOLOG FLEXPEN) 100 UNIT/ML injection Inject 0-12 Units into the skin See admin instructions. Per sliding scale Under 150 no units 150-199 take 8 units 200-249 take 10 units 250-299 take 12 units   Yes [provider]  lisinopril (PRINIVIL,ZESTRIL) 20 MG tablet Take 20 mg by mouth daily.   Yes [provider]  LYRICA CR 165 MG TB24 Take 165 mg daily by mouth.  01/29/17  Yes [provider]  montelukast (SINGULAIR) 10 MG tablet Take 10 mg by mouth at bedtime.   Yes [provider]  Naloxone HCl (EVZIO) 0.4 MG/0.4ML SOAJ Inject 0.4 mg as directed as needed (for possible overdose).    Yes [provider]  nitroGLYCERIN (NITROSTAT) 0.4  MG SL tablet Place 1 tablet (0.4 mg total) under the tongue every 5 (five) minutes as needed for chest pain. 10/27/16 02/07/22 Yes Burnell Blanks, MD  oxyCODONE-acetaminophen (PERCOCET) 10-325 MG per tablet Take 1 tablet by mouth 3 (three) times daily.   Yes [provider]  rosuvastatin (CRESTOR) 10 MG tablet Take 10 mg by mouth every evening.  12/05/15  Yes [provider]  Semaglutide (OZEMPIC) 0.25 or 0.5 MG/DOSE SOPN Inject 25 mg once a week into the skin. On Tuesday   Yes [provider]  SitaGLIPtin-MetFORMIN HCl (JANUMET XR) 50-1000 MG TB24 Take 1 tablet by mouth 2 (two) times daily.   Yes [provider]  TRESIBA FLEXTOUCH 100 UNIT/ML SOPN FlexTouch Pen Inject 40 Units as directed at bedtime.  08/13/16  Yes [provider]     Allergies:    Allergies  Allergen Reactions  . Metformin Diarrhea and Nausea And Vomiting    Immediate release tablets (not metformin ER, pt currently taking)    Social History:   Social History   Socioeconomic History  . Marital status: Divorced    Spouse name: Not on file  . Number of children: 2  . Years of education: Not on file  . Highest education level: Not on file  Social Needs  . Financial resource strain: Not on file  . Food insecurity - worry: Not on file  . Food insecurity - inability: Not on file  . Transportation needs - medical: Not on file  . Transportation needs - non-medical: Not on file  Occupational History  . Occupation: Disability  Tobacco Use  . Smoking status: Former Smoker    Packs/day: 1.00    Years: 28.00    Pack years: 28.00    Types: Cigarettes    Last attempt to quit: 03/31/2009    Years since quitting: 7.8  . Smokeless tobacco: Never Used  Substance and Sexual Activity  . Alcohol use: No  . Drug use: No  . Sexual activity: Yes    Birth control/protection: Surgical    Comment: partial hysterectomy  Other Topics Concern  . Not on file  Social History Narrative   . Not on file    Family History:   The patient's family history includes Arthritis in her father; Asthma in her father; Diabetes in her brother, father, and mother; Emphysema in her maternal grandmother; Heart disease in her father and mother; Hypertension in her brother, father, and mother; Migraines in her mother; Stroke in her mother.    ROS:  Please see the history of present illness.  All other ROS reviewed and negative.     Physical Exam/Data:   Vitals:   02/06/17  2322 02/07/17 0003 02/07/17 0032 02/07/17 0100  BP: 122/85 108/62  92/73  Pulse: 75 72  71  Resp: 18 18  (!) 25  Temp: 98.2 F (36.8 C)     TempSrc: Oral     SpO2: 100% 98%  98%  Weight:   93.8 kg (206 lb 12.7 oz)   Height:   5\' 4"  (1.626 m)    No intake or output data in the 24 hours ending 02/07/17 0134 Filed Weights   02/07/17 0032  Weight: 93.8 kg (206 lb 12.7 oz)   Body mass index is 35.5 kg/m.  General:  Well nourished, well developed, in no acute distress HEENT: normal Lymph: no adenopathy Neck: no JVD Endocrine:  No thryomegaly Vascular: No carotid bruits; FA pulses 2+ bilaterally without bruits  Cardiac:  normal S1, S2; RRR; no murmur  Lungs:  clear to auscultation bilaterally, no wheezing, rhonchi or rales  Abd: soft, nontender, no hepatomegaly  Ext: no edema Musculoskeletal:  No deformities, BUE and BLE strength normal and equal Skin: warm and dry  Neuro:  CNs 2-12 intact, no focal abnormalities noted Psych:  Normal affect    EKG:  The ECG that was done  was personally reviewed and demonstrates   Relevant CV Studies:   Laboratory Data:  Chemistry Recent Labs  Lab 02/06/17 2329  NA 136  K 4.6  CL 103  CO2 25  GLUCOSE 138*  BUN 15  CREATININE 0.80  CALCIUM 9.7  GFRNONAA >60  GFRAA >60  ANIONGAP 8    No results for input(s): PROT, ALBUMIN, AST, ALT, ALKPHOS, BILITOT in the last 168 hours. Hematology Recent Labs  Lab 02/06/17 2329  WBC 9.9  RBC 4.40  HGB 13.8  HCT  39.8  MCV 90.5  MCH 31.4  MCHC 34.7  RDW 12.8  PLT 300   Cardiac Enzymes Recent Labs  Lab 02/06/17 2329  TROPONINI 0.05*    Recent Labs  Lab 02/06/17 2344  TROPIPOC 0.03    BNPNo results for input(s): BNP, PROBNP in the last 168 hours.  DDimer No results for input(s): DDIMER in the last 168 hours.  Radiology/Studies:  Dg Chest 2 View  Result Date: 02/06/2017 CLINICAL DATA:  Chest pain EXAM: CHEST  2 VIEW COMPARISON:  Chest radiograph 01/10/2017 FINDINGS: Shallow lung inflation. The heart size and mediastinal contours are within normal limits. Both lungs are clear. The visualized skeletal structures are unremarkable. Status post CABG and mitral valve replacement. IMPRESSION: No active cardiopulmonary disease. Electronically Signed   By: Ulyses Jarred M.D.   On: 02/06/2017 23:52    Assessment and Plan:   1.ABNL  TROPONIN  Labs , from [employer] , 0.06 2. Chest pain, recent  Office  Visit,  PRIOR  CAD  As  Above in HPI  We  Will  Request  For LHC, which  Was  Originally  On  Thursday  , now  To be  Requested  For  Wednesday , due to chest pain , and  abnl  Troponin  Severity of Illness: The appropriate patient status for this patient is INPATIENT. Inpatient status is judged to be reasonable and necessary in order to provide the required intensity of service to ensure the patient's safety. The patient's presenting symptoms, physical exam findings, and initial radiographic and laboratory data in the context of their chronic comorbidities is felt to place them at high risk for further clinical deterioration. Furthermore, it is not anticipated that the patient will be medically  stable for discharge from the hospital within 2 midnights of admission. The following factors support the patient status of inpatient.   " The patient's presenting symptoms include chest pain ,  Resoved, abnl  Labs  [facility] "   * I certify that at the point of admission it is my clinical judgment that the  patient will require inpatient hospital care spanning beyond 2 midnights from the point of admission due to high intensity of service, high risk for further deterioration and high frequency of surveillance required.*    For questions or updates, please contact [place] Please consult [email or web address] for contact info under Cardiology/STEMI.    Signed, Johna Sheriff, MD  02/07/2017 1:34 AM

## 2017-02-07 NOTE — Progress Notes (Signed)
Progress Note  Patient Name: Elizabeth Park Date of Encounter: 02/07/2017  Primary Cardiologist: Dr. Angelena Form  Subjective   Pt had a bout of chest pain in the early morning, relieved with one nitro SL. She is currently resting comfortably and chest pain free.  Inpatient Medications    Scheduled Meds: . amLODipine  5 mg Oral BID  . [START ON 02/08/2017] aspirin EC  81 mg Oral Daily  . carvedilol  12.5 mg Oral BID WC  . clopidogrel  75 mg Oral Q breakfast  . fluticasone  1 spray Each Nare BID  . furosemide  10 mg Oral Daily  . lisinopril  20 mg Oral Daily  . montelukast  10 mg Oral QHS  . pregabalin  75 mg Oral BID  . rosuvastatin  10 mg Oral QPM  . sodium chloride flush  3 mL Intravenous Q12H   Continuous Infusions: . sodium chloride    . sodium chloride 1 mL/kg/hr (02/07/17 0309)  . heparin 1,000 Units/hr (02/07/17 0107)   PRN Meds: sodium chloride, acetaminophen, nitroGLYCERIN, ondansetron (ZOFRAN) IV, sodium chloride flush   Vital Signs    Vitals:   02/07/17 0246 02/07/17 0439 02/07/17 0624 02/07/17 0629  BP: 109/70 102/71 126/82 106/73  Pulse: 72 64 77   Resp: 12 16    Temp: 98.2 F (36.8 C) 98.1 F (36.7 C)    TempSrc: Oral Oral    SpO2: 97% 98%    Weight:      Height:        Intake/Output Summary (Last 24 hours) at 02/07/2017 0809 Last data filed at 02/07/2017 1443 Gross per 24 hour  Intake 300 ml  Output 400 ml  Net -100 ml   Filed Weights   02/07/17 0032 02/07/17 0230  Weight: 206 lb 12.7 oz (93.8 kg) 206 lb 4.8 oz (93.6 kg)     Physical Exam   General: Well developed, well nourished, female appearing in no acute distress. Head: Normocephalic, atraumatic.  Neck: Supple without bruits, JVD. Lungs:  Resp regular and unlabored, CTA. Heart: RRR, S1, S2, no S3, S4, or murmur; no rub. Abdomen: Soft, non-tender, non-distended with normoactive bowel sounds. No hepatomegaly. No rebound/guarding. No obvious abdominal masses. Extremities: No clubbing,  cyanosis, no edema. Distal pedal pulses are faint bilaterally. Neuro: Alert and oriented X 3. Moves all extremities spontaneously. Psych: Normal affect.  Labs    Chemistry Recent Labs  Lab 02/06/17 1638 02/06/17 2329  NA 137 136  K 4.5 4.6  CL 98 103  CO2 23 25  GLUCOSE 133* 138*  BUN 15 15  CREATININE 0.75 0.80  CALCIUM 10.1 9.7  PROT 7.9  --   ALBUMIN 4.8  --   AST 19  --   ALT 16  --   ALKPHOS 40  --   BILITOT 0.3  --   GFRNONAA 95 >60  GFRAA 109 >60  ANIONGAP  --  8     Hematology Recent Labs  Lab 02/06/17 1638 02/06/17 2329  WBC 9.1 9.9  RBC 4.55 4.40  HGB 14.3 13.8  HCT 41.4 39.8  MCV 91 90.5  MCH 31.4 31.4  MCHC 34.5 34.7  RDW 13.4 12.8  PLT 316 300    Cardiac Enzymes Recent Labs  Lab 02/06/17 1636 02/06/17 2329  TROPONINI 0.06* 0.05*    Recent Labs  Lab 02/06/17 2344  TROPIPOC 0.03     BNPNo results for input(s): BNP, PROBNP in the last 168 hours.   DDimer No results  for input(s): DDIMER in the last 168 hours.   Radiology    Dg Chest 2 View  Result Date: 02/06/2017 CLINICAL DATA:  Chest pain EXAM: CHEST  2 VIEW COMPARISON:  Chest radiograph 01/10/2017 FINDINGS: Shallow lung inflation. The heart size and mediastinal contours are within normal limits. Both lungs are clear. The visualized skeletal structures are unremarkable. Status post CABG and mitral valve replacement. IMPRESSION: No active cardiopulmonary disease. Electronically Signed   By: Ulyses Jarred M.D.   On: 02/06/2017 23:52     Telemetry    Sinus with occasional PVCs - Personally Reviewed  ECG    No new tracings - Personally Reviewed   Cardiac Studies   Echo pending  Patient Profile     46 y.o. female with history ofCAD s/p 4VCABG 2011 and mitral valve repair for severe mitral regurgitation, DESx3 in 09/2016, DM, HTN, HLD, former tobacco abuse, chronic diastolic CHF who presented with chest pain.  Assessment & Plan    1. CAD s/p CABG (2011), DES x 3 09/2016 -  pt presented to clinic with a mixed clinical picture of typical and atypical chest pain - Discussed with attending in clinic and will proceed with heart catheterization tomorrow - continue heparin drip today - continue ASA, plavix, and statin  2. HTN - continue coreg, lisinopril, and norvasc  3. Chronic diastolic heart failure - does not appear volume overloaded on exam  4. Hx of mitral valve repair - echo today to check valve function to rule out contribution to current symptoms  5. HLD - continue statin   Signed, Ledora Bottcher , PA-C 8:09 AM 02/07/2017 Pager: [phone] As above, patient seen and examined. She is without chest pain this morning. Plan cardiac catheterization today given persistent symptoms. The risks and benefits including myocardial infarction, CVA and death discussed and she agrees to proceed. Continue aspirin, Plavix, carvedilol and statin.  Kirk Ruths, MD

## 2017-02-07 NOTE — Interval H&P Note (Signed)
History and Physical Interval Note:  02/07/2017 5:44 PM  Elizabeth Park  has presented today for surgery, with the diagnosis of cp  The various methods of treatment have been discussed with the patient and family. After consideration of risks, benefits and other options for treatment, the patient has consented to  Procedure(s): LEFT HEART CATH AND CORS/GRAFTS ANGIOGRAPHY (N/A) as a surgical intervention .  The patient's history has been reviewed, patient examined, no change in status, stable for surgery.  I have reviewed the patient's chart and labs.  Questions were answered to the patient's satisfaction.   Cath Lab Visit (complete for each Cath Lab visit)  Clinical Evaluation Leading to the Procedure:   ACS: Yes.    Non-ACS:    Anginal Classification: CCS III  Anti-ischemic medical therapy: Maximal Therapy (2 or more classes of medications)  Non-Invasive Test Results: No non-invasive testing performed  Prior CABG: Previous CABG        Elizabeth Park [facility] 02/07/2017 5:44 PM

## 2017-02-07 NOTE — Progress Notes (Addendum)
[place] for heparin Indication: chest pain/ACS  Allergies  Allergen Reactions  . Metformin Diarrhea and Nausea And Vomiting    Immediate release tablets (not metformin ER, pt currently taking)    Patient Measurements: Height: 5\' 4"  (162.6 cm) Weight: 206 lb 4.8 oz (93.6 kg) IBW/kg (Calculated) : 54.7 Heparin Dosing Weight: 75 kg  Vital Signs: Temp: 98.9 F (37.2 C) (11/07 0900) Temp Source: Oral (11/07 0900) BP: 116/83 (11/07 0900) Pulse Rate: 72 (11/07 0900)  Labs: Recent Labs    02/06/17 1636 02/06/17 1638 02/06/17 2329 02/07/17 0404 02/07/17 0738  HGB  --  14.3 13.8  --   --   HCT  --  41.4 39.8  --   --   PLT  --  316 300  --   --   LABPROT  --  10.5  --  14.1  --   INR  --  1.0  --  1.10  --   HEPARINUNFRC  --   --   --   --  0.51  CREATININE  --  0.75 0.80  --   --   TROPONINI 0.06*  --  0.05*  --   --     Estimated Creatinine Clearance: 95.4 mL/min (by C-G formula based on SCr of 0.8 mg/dL).   Assessment: 46 yo Park sent to ED for positive troponin at outpatient office and started on heparin drip. Plan is for cath today since had symptoms this morning.   Heparin level is therapeutic at 0.51. No bleeding noted, CBC is stable.  Goal of Therapy:  Heparin level 0.3-0.7 units/ml Monitor platelets by anticoagulation protocol: Yes   Plan:  Continue heparin drip at 1000 units/hr Confirmatory heparin level at 13:30 Daily heparin level and CBC F/u after cath   Renold Genta, PharmD, BCPS Clinical Pharmacist Phone for today - [place] - [phone] 02/07/2017 10:36 AM    Addendum: Confirmatory heparin level is therapeutic at 0.42 - continue plan as above.   Renold Genta, PharmD, BCPS Clinical Pharmacist 02/07/2017 4:23 PM

## 2017-02-07 NOTE — ED Provider Notes (Signed)
[facility] EMERGENCY DEPARTMENT Provider Note   CSN: [id number] Arrival date & time: 02/06/17  2319     History   Chief Complaint Chief Complaint  Patient presents with  . Chest Pain    HPI Elizabeth Park is a 46 y.o. female.  HPI  This is a 46 year old female with a history of heart failure, diabetes, extensive coronary artery disease status post bypass and most recently 3 drug-eluting stents placed in June 2018 who presents with chest pain and a positive troponin from cardiology office.  Patient states of the last month she has had recurrent anterior chest pain.  It is exertional but also worse with food and other factors.  She has developed worsening shortness of breath.  She saw her primary physician and followed up with her cardiologist yesterday.  Story was thought to be atypical; however, given her history of heart disease, lab work was obtained.  She received a call that her troponin was elevated at 0.06.  She has been pain-free for many hours without recurrence of her symptoms.  She currently denies any symptoms.    Past Medical History:  Diagnosis Date  . Chronic diastolic CHF (congestive heart failure) ([place])   . Coronary artery disease    a. she underwent a a 4V CABG  (LIMA to LAD, SVG to subbranch of ramus intermediate, SVG to lateral subbranch of ramus intermediate, SVG to distal RCA) and mitral valve repair January 2011. b. Abnormal nuc 09/2016 s/p  drug eluting stent to the vein graft to the PDA, a drug eluting stent in the native Ramus intermediate branch and a drug eluting stent in the protected left main.  . Diabetes mellitus   . H/O emotional problems   . Headache, migraine   . High cholesterol   . History of blood transfusion   . Hypertension   . MI, old   . MRSA (methicillin resistant Staphylococcus aureus)   . MVP (mitral valve prolapse)   . Neuromuscular disorder ([place])   . Neuropathy   . S/P mitral valve repair   . Sebaceous cyst      Patient Active Problem List   Diagnosis Date Noted  . Unstable angina ([place])   . Follow-up examination, following unspecified surgery 01/29/2012  . S/P mitral valve repair 11/05/2011  . TOBACCO USE, QUIT 10/15/2009  . PERIPHERAL NEUROPATHY, FEET 09/02/2009  . CAD, ARTERY BYPASS GRAFT 05/11/2009  . MITRAL REGURGITATION 04/28/2009  . Hyperlipidemia 04/26/2009  . Essential hypertension 04/26/2009  . SEBACEOUS CYST 10/09/2008  . Type 2 diabetes mellitus with complication ([place]) 63/04/6008  . TOBACCO ABUSE 04/10/2008  . FIBROCYSTIC BREAST DISEASE 04/10/2008    Past Surgical History:  Procedure Laterality Date  . ABDOMINAL HYSTERECTOMY    . CARDIAC CATHETERIZATION  09/22/2016  . CORONARY ARTERY BYPASS GRAFT  with valve repair  . CORONARY STENT PLACEMENT  09/22/2016  . SKIN GRAFT      OB History    Gravida Para Term Preterm AB Living   2 2 2     2    SAB TAB Ectopic Multiple Live Births                   Home Medications    Prior to Admission medications   Medication Sig Start Date End Date Taking? Authorizing Provider  amLODipine (NORVASC) 5 MG tablet Take 5 mg by mouth 2 (two) times daily.   Yes [provider]  aspirin 81 MG tablet Take 81 mg by mouth  daily.  11/02/11  Yes Hillary Bow, MD  carvedilol (COREG) 12.5 MG tablet Take 12.5 mg by mouth 2 (two) times daily. 12/01/15  Yes [provider]  clopidogrel (PLAVIX) 75 MG tablet Take 1 tablet (75 mg total) by mouth daily with breakfast. 09/24/16  Yes Eileen Stanford, PA-C  fluticasone (FLONASE) 50 MCG/ACT nasal spray Place 1 spray into both nostrils 2 (two) times daily.    Yes [provider]  furosemide (LASIX) 20 MG tablet TAKE ONE TABLET BY MOUTH DAILY AS NEEDED FOR SWELLING 01/15/17  Yes Burnell Blanks, MD  insulin aspart (NOVOLOG FLEXPEN) 100 UNIT/ML injection Inject 0-12 Units into the skin See admin instructions. Per sliding scale Under 150 no units 150-199 take 8  units 200-249 take 10 units 250-299 take 12 units   Yes [provider]  lisinopril (PRINIVIL,ZESTRIL) 20 MG tablet Take 20 mg by mouth daily.   Yes [provider]  LYRICA CR 165 MG TB24 Take 165 mg daily by mouth.  01/29/17  Yes [provider]  montelukast (SINGULAIR) 10 MG tablet Take 10 mg by mouth at bedtime.   Yes [provider]  Naloxone HCl (EVZIO) 0.4 MG/0.4ML SOAJ Inject 0.4 mg as directed as needed (for possible overdose).    Yes [provider]  nitroGLYCERIN (NITROSTAT) 0.4 MG SL tablet Place 1 tablet (0.4 mg total) under the tongue every 5 (five) minutes as needed for chest pain. 10/27/16 02/07/22 Yes Burnell Blanks, MD  oxyCODONE-acetaminophen (PERCOCET) 10-325 MG per tablet Take 1 tablet by mouth 3 (three) times daily.   Yes [provider]  rosuvastatin (CRESTOR) 10 MG tablet Take 10 mg by mouth every evening.  12/05/15  Yes [provider]  Semaglutide (OZEMPIC) 0.25 or 0.5 MG/DOSE SOPN Inject 25 mg once a week into the skin. On Tuesday   Yes [provider]  SitaGLIPtin-MetFORMIN HCl (JANUMET XR) 50-1000 MG TB24 Take 1 tablet by mouth 2 (two) times daily.   Yes [provider]  TRESIBA FLEXTOUCH 100 UNIT/ML SOPN FlexTouch Pen Inject 40 Units as directed at bedtime.  08/13/16  Yes [provider]    Family History Family History  Problem Relation Age of Onset  . Heart disease Father   . Asthma Father   . Hypertension Father   . Diabetes Father   . Arthritis Father   . Heart disease Mother   . Hypertension Mother   . Diabetes Mother   . Migraines Mother   . Stroke Mother   . Emphysema Maternal Grandmother   . Hypertension Brother   . Diabetes Brother     Social History Social History   Tobacco Use  . Smoking status: Former Smoker    Packs/day: 1.00    Years: 28.00    Pack years: 28.00    Types: Cigarettes    Last attempt to quit: 03/31/2009    Years since  quitting: 7.8  . Smokeless tobacco: Never Used  Substance Use Topics  . Alcohol use: No  . Drug use: No     Allergies   Metformin   Review of Systems Review of Systems  Constitutional: Negative for fever.  Respiratory: Positive for shortness of breath.   Cardiovascular: Positive for chest pain. Negative for leg swelling.  Gastrointestinal: Negative for abdominal pain, nausea and vomiting.  Genitourinary: Negative for dysuria.  All other systems reviewed and are negative.    Physical Exam Updated Vital Signs BP 108/62   Pulse 72  Temp 98.2 F (36.8 C) (Oral)   Resp 18   SpO2 98%   Physical Exam  Constitutional: She is oriented to person, place, and time. She appears well-developed and well-nourished.  HENT:  Head: Normocephalic and atraumatic.  Eyes: Pupils are equal, round, and reactive to light.  Neck: Neck supple.  Cardiovascular: Normal rate, regular rhythm and normal pulses.  Murmur heard. Pulmonary/Chest: Effort normal. No respiratory distress. She has no wheezes.  Midline sternotomy scar  Abdominal: Soft. Bowel sounds are normal.  Neurological: She is alert and oriented to person, place, and time.  Skin: Skin is warm and dry.  Psychiatric: She has a normal mood and affect.  Nursing note and vitals reviewed.    ED Treatments / Results  Labs (all labs ordered are listed, but only abnormal results are displayed) Labs Reviewed  BASIC METABOLIC PANEL - Abnormal; Notable for the following components:      Result Value   Glucose, Bld 138 (*)    All other components within normal limits  CBC  TROPONIN I  I-STAT TROPONIN, ED    EKG  EKG Interpretation  Date/Time:  Tuesday February 06 2017 23:26:04 EST Ventricular Rate:  74 PR Interval:  140 QRS Duration: 80 QT Interval:  380 QTC Calculation: 421 R Axis:   -27 Text Interpretation:  Normal sinus rhythm Low voltage QRS Cannot rule out Anterior infarct , age undetermined Abnormal ECG No significant  change since last tracing Confirmed by Thayer Jew [phone]) on 02/07/2017 12:13:17 AM       Radiology Dg Chest 2 View  Result Date: 02/06/2017 CLINICAL DATA:  Chest pain EXAM: CHEST  2 VIEW COMPARISON:  Chest radiograph 01/10/2017 FINDINGS: Shallow lung inflation. The heart size and mediastinal contours are within normal limits. Both lungs are clear. The visualized skeletal structures are unremarkable. Status post CABG and mitral valve replacement. IMPRESSION: No active cardiopulmonary disease. Electronically Signed   By: Ulyses Jarred M.D.   On: 02/06/2017 23:52    Procedures Procedures (including critical care time)  CRITICAL CARE Performed by: Merryl Hacker   Total critical care time: 25 minutes  Critical care time was exclusive of separately billable procedures and treating other patients.  Critical care was necessary to treat or prevent imminent or life-threatening deterioration.  Critical care was time spent personally by me on the following activities: development of treatment plan with patient and/or surrogate as well as nursing, discussions with consultants, evaluation of patient's response to treatment, examination of patient, obtaining history from patient or surrogate, ordering and performing treatments and interventions, ordering and review of laboratory studies, ordering and review of radiographic studies, pulse oximetry and re-evaluation of patient's condition.   Medications Ordered in ED Medications  aspirin chewable tablet 324 mg (324 mg Oral Given 02/07/17 0030)     Initial Impression / Assessment and Plan / ED Course  I have reviewed the triage vital signs and the nursing notes.  Pertinent labs & imaging results that were available during my care of the patient were reviewed by me and considered in my medical decision making (see chart for details).     Patient presents with atypical chest pain over the last month but worsening shortness of breath.   Positive troponin at cardiology office.  EKG here without evidence of acute ischemia.  Troponin is negative here.  Patient was given full dose aspirin.  Plan for elective cardiac cath on Thursday; however, given positive troponin in office, this was discussed with cardiology fellow.  Will follow ACS protocol and start heparin.  He will admit the patient for further management.   Final Clinical Impressions(s) / ED Diagnoses   Final diagnoses:  Atypical chest pain  Elevated troponin    ED Discharge Orders    None       Tashiya Souders, Barbette Hair, MD 02/07/17 0040

## 2017-02-07 NOTE — H&P (View-Only) (Signed)
Progress Note  Patient Name: Elizabeth Park Date of Encounter: 02/07/2017  Primary Cardiologist: Dr. Angelena Form  Subjective   Pt had a bout of chest pain in the early morning, relieved with one nitro SL. She is currently resting comfortably and chest pain free.  Inpatient Medications    Scheduled Meds: . amLODipine  5 mg Oral BID  . [START ON 02/08/2017] aspirin EC  81 mg Oral Daily  . carvedilol  12.5 mg Oral BID WC  . clopidogrel  75 mg Oral Q breakfast  . fluticasone  1 spray Each Nare BID  . furosemide  10 mg Oral Daily  . lisinopril  20 mg Oral Daily  . montelukast  10 mg Oral QHS  . pregabalin  75 mg Oral BID  . rosuvastatin  10 mg Oral QPM  . sodium chloride flush  3 mL Intravenous Q12H   Continuous Infusions: . sodium chloride    . sodium chloride 1 mL/kg/hr (02/07/17 0309)  . heparin 1,000 Units/hr (02/07/17 0107)   PRN Meds: sodium chloride, acetaminophen, nitroGLYCERIN, ondansetron (ZOFRAN) IV, sodium chloride flush   Vital Signs    Vitals:   02/07/17 0246 02/07/17 0439 02/07/17 0624 02/07/17 0629  BP: 109/70 102/71 126/82 106/73  Pulse: 72 64 77   Resp: 12 16    Temp: 98.2 F (36.8 C) 98.1 F (36.7 C)    TempSrc: Oral Oral    SpO2: 97% 98%    Weight:      Height:        Intake/Output Summary (Last 24 hours) at 02/07/2017 0809 Last data filed at 02/07/2017 3762 Gross per 24 hour  Intake 300 ml  Output 400 ml  Net -100 ml   Filed Weights   02/07/17 0032 02/07/17 0230  Weight: 206 lb 12.7 oz (93.8 kg) 206 lb 4.8 oz (93.6 kg)     Physical Exam   General: Well developed, well nourished, female appearing in no acute distress. Head: Normocephalic, atraumatic.  Neck: Supple without bruits, JVD. Lungs:  Resp regular and unlabored, CTA. Heart: RRR, S1, S2, no S3, S4, or murmur; no rub. Abdomen: Soft, non-tender, non-distended with normoactive bowel sounds. No hepatomegaly. No rebound/guarding. No obvious abdominal masses. Extremities: No clubbing,  cyanosis, no edema. Distal pedal pulses are faint bilaterally. Neuro: Alert and oriented X 3. Moves all extremities spontaneously. Psych: Normal affect.  Labs    Chemistry Recent Labs  Lab 02/06/17 1638 02/06/17 2329  NA 137 136  K 4.5 4.6  CL 98 103  CO2 23 25  GLUCOSE 133* 138*  BUN 15 15  CREATININE 0.75 0.80  CALCIUM 10.1 9.7  PROT 7.9  --   ALBUMIN 4.8  --   AST 19  --   ALT 16  --   ALKPHOS 40  --   BILITOT 0.3  --   GFRNONAA 95 >60  GFRAA 109 >60  ANIONGAP  --  8     Hematology Recent Labs  Lab 02/06/17 1638 02/06/17 2329  WBC 9.1 9.9  RBC 4.55 4.40  HGB 14.3 13.8  HCT 41.4 39.8  MCV 91 90.5  MCH 31.4 31.4  MCHC 34.5 34.7  RDW 13.4 12.8  PLT 316 300    Cardiac Enzymes Recent Labs  Lab 02/06/17 1636 02/06/17 2329  TROPONINI 0.06* 0.05*    Recent Labs  Lab 02/06/17 2344  TROPIPOC 0.03     BNPNo results for input(s): BNP, PROBNP in the last 168 hours.   DDimer No results  for input(s): DDIMER in the last 168 hours.   Radiology    Dg Chest 2 View  Result Date: 02/06/2017 CLINICAL DATA:  Chest pain EXAM: CHEST  2 VIEW COMPARISON:  Chest radiograph 01/10/2017 FINDINGS: Shallow lung inflation. The heart size and mediastinal contours are within normal limits. Both lungs are clear. The visualized skeletal structures are unremarkable. Status post CABG and mitral valve replacement. IMPRESSION: No active cardiopulmonary disease. Electronically Signed   By: Ulyses Jarred M.D.   On: 02/06/2017 23:52     Telemetry    Sinus with occasional PVCs - Personally Reviewed  ECG    No new tracings - Personally Reviewed   Cardiac Studies   Echo pending  Patient Profile     46 y.o. female with history ofCAD s/p 4VCABG 2011 and mitral valve repair for severe mitral regurgitation, DESx3 in 09/2016, DM, HTN, HLD, former tobacco abuse, chronic diastolic CHF who presented with chest pain.  Assessment & Plan    1. CAD s/p CABG (2011), DES x 3 09/2016 -  pt presented to clinic with a mixed clinical picture of typical and atypical chest pain - Discussed with attending in clinic and will proceed with heart catheterization tomorrow - continue heparin drip today - continue ASA, plavix, and statin  2. HTN - continue coreg, lisinopril, and norvasc  3. Chronic diastolic heart failure - does not appear volume overloaded on exam  4. Hx of mitral valve repair - echo today to check valve function to rule out contribution to current symptoms  5. HLD - continue statin   Signed, Ledora Bottcher , PA-C 8:09 AM 02/07/2017 Pager: [phone] As above, patient seen and examined. She is without chest pain this morning. Plan cardiac catheterization today given persistent symptoms. The risks and benefits including myocardial infarction, CVA and death discussed and she agrees to proceed. Continue aspirin, Plavix, carvedilol and statin.  Kirk Ruths, MD

## 2017-02-07 NOTE — Progress Notes (Signed)
ANTICOAGULATION CONSULT NOTE - Initial Consult  Pharmacy Consult for heparin Indication: chest pain/ACS  Allergies  Allergen Reactions  . Metformin Diarrhea and Nausea And Vomiting    Immediate release tablets (not metformin ER, pt currently taking)    Patient Measurements: Height: 5\' 4"  (162.6 cm) Weight: 206 lb 12.7 oz (93.8 kg) IBW/kg (Calculated) : 54.7 Heparin Dosing Weight: 75kg  Vital Signs: Temp: 98.2 F (36.8 C) (11/06 2322) Temp Source: Oral (11/06 2322) BP: 108/62 (11/07 0003) Pulse Rate: 72 (11/07 0003)  Labs: Recent Labs    02/06/17 2329  HGB 13.8  HCT 39.8  PLT 300  CREATININE 0.80    Estimated Creatinine Clearance: 95.4 mL/min (by C-G formula based on SCr of 0.8 mg/dL).   Medical History: Past Medical History:  Diagnosis Date  . Chronic diastolic CHF (congestive heart failure) ([place])   . Coronary artery disease    a. she underwent a a 4V CABG  (LIMA to LAD, SVG to subbranch of ramus intermediate, SVG to lateral subbranch of ramus intermediate, SVG to distal RCA) and mitral valve repair January 2011. b. Abnormal nuc 09/2016 s/p  drug eluting stent to the vein graft to the PDA, a drug eluting stent in the native Ramus intermediate branch and a drug eluting stent in the protected left main.  . Diabetes mellitus   . H/O emotional problems   . Headache, migraine   . High cholesterol   . History of blood transfusion   . Hypertension   . MI, old   . MRSA (methicillin resistant Staphylococcus aureus)   . MVP (mitral valve prolapse)   . Neuromuscular disorder ([place])   . Neuropathy   . S/P mitral valve repair   . Sebaceous cyst     Assessment: 46yo female sent to ED for positive troponin at outpatient office, has been CP-free for several hours and istat troponin in ED was negative, to begin heparin.  Goal of Therapy:  Heparin level 0.3-0.7 units/ml Monitor platelets by anticoagulation protocol: Yes   Plan:  Will give heparin 4000 units IV bolus x1  followed by gtt at 1000 units/hr and monitor heparin levels and CBC.  Wynona Neat, PharmD, BCPS  02/07/2017,12:42 AM

## 2017-02-07 NOTE — Progress Notes (Signed)
Patient had an episode of chest pain 8/10. Pain was relived by one dose of nitro. Bp 106/73. Patient reports pain is 1/10. Will continue to monitor patient.

## 2017-02-08 ENCOUNTER — Encounter (HOSPITAL_COMMUNITY): Payer: Self-pay

## 2017-02-08 ENCOUNTER — Telehealth: Payer: Self-pay | Admitting: Physician Assistant

## 2017-02-08 ENCOUNTER — Ambulatory Visit (HOSPITAL_COMMUNITY): Admit: 2017-02-08 | Payer: Medicare Other | Admitting: Cardiovascular Disease

## 2017-02-08 ENCOUNTER — Encounter (HOSPITAL_COMMUNITY): Payer: Self-pay | Admitting: Cardiology

## 2017-02-08 LAB — CBC
HCT: 33.4 % — ABNORMAL LOW (ref 36.0–46.0)
Hemoglobin: 11.4 g/dL — ABNORMAL LOW (ref 12.0–15.0)
MCH: 30.8 pg (ref 26.0–34.0)
MCHC: 34.1 g/dL (ref 30.0–36.0)
MCV: 90.3 fL (ref 78.0–100.0)
Platelets: 256 10*3/uL (ref 150–400)
RBC: 3.7 MIL/uL — ABNORMAL LOW (ref 3.87–5.11)
RDW: 13.1 % (ref 11.5–15.5)
WBC: 7 10*3/uL (ref 4.0–10.5)

## 2017-02-08 LAB — BASIC METABOLIC PANEL
Anion gap: 8 (ref 5–15)
BUN: 9 mg/dL (ref 6–20)
CO2: 20 mmol/L — ABNORMAL LOW (ref 22–32)
Calcium: 8.6 mg/dL — ABNORMAL LOW (ref 8.9–10.3)
Chloride: 109 mmol/L (ref 101–111)
Creatinine, Ser: 0.69 mg/dL (ref 0.44–1.00)
GFR calc Af Amer: >60 mL/min (ref >60)
GFR calc non Af Amer: >60 mL/min (ref >60)
Glucose, Bld: 116 mg/dL — ABNORMAL HIGH (ref 65–99)
Potassium: 3.5 mmol/L (ref 3.5–5.1)
Sodium: 137 mmol/L (ref 135–145)

## 2017-02-08 LAB — GLUCOSE, CAPILLARY
Glucose-Capillary: 135 mg/dL — ABNORMAL HIGH (ref 65–99)
Glucose-Capillary: 144 mg/dL — ABNORMAL HIGH (ref 65–99)

## 2017-02-08 SURGERY — LEFT HEART CATH AND CORS/GRAFTS ANGIOGRAPHY
Anesthesia: LOCAL

## 2017-02-08 MED ORDER — CARVEDILOL 3.125 MG PO TABS
6.2500 mg | ORAL_TABLET | Freq: Two times a day (BID) | ORAL | Status: DC
  Administered 2017-02-08: 09:00:00 6.25 mg via ORAL
  Filled 2017-02-08: qty 2

## 2017-02-08 NOTE — Telephone Encounter (Signed)
Pt still currently admitted.

## 2017-02-08 NOTE — Progress Notes (Signed)
Progress Note  Patient Name: Elizabeth Park Date of Encounter: 02/08/2017  Primary Cardiologist: Dr. Angelena Form  Subjective   No chest pain or dyspnea this AM.  Inpatient Medications    Scheduled Meds: . amLODipine  5 mg Oral BID  . aspirin EC  81 mg Oral Daily  . carvedilol  12.5 mg Oral BID WC  . clopidogrel  75 mg Oral Q breakfast  . fluticasone  1 spray Each Nare BID  . furosemide  10 mg Oral Daily  . insulin aspart  0-15 Units Subcutaneous TID WC  . lisinopril  20 mg Oral Daily  . montelukast  10 mg Oral QHS  . pregabalin  75 mg Oral BID  . rosuvastatin  10 mg Oral QPM  . sodium chloride flush  3 mL Intravenous Q12H   Continuous Infusions: . sodium chloride     PRN Meds: sodium chloride, acetaminophen, nitroGLYCERIN, ondansetron (ZOFRAN) IV, sodium chloride flush   Vital Signs    Vitals:   02/08/17 0426 02/08/17 0620 02/08/17 0645 02/08/17 0700  BP: (!) 82/47 (!) 85/49 (!) 84/49 (!) 89/62  Pulse: 69   71  Resp: (!) 22  12 (!) 21  Temp: 98.4 F (36.9 C)   98.5 F (36.9 C)  TempSrc: Oral   Oral  SpO2: 99%   94%  Weight:      Height:        Intake/Output Summary (Last 24 hours) at 02/08/2017 0747 Last data filed at 02/08/2017 0500 Gross per 24 hour  Intake 3130.58 ml  Output 1150 ml  Net 1980.58 ml   Filed Weights   02/07/17 0230 02/07/17 1933 02/08/17 0119  Weight: 206 lb 4.8 oz (93.6 kg) 206 lb 5.6 oz (93.6 kg) 206 lb 5.6 oz (93.6 kg)     Physical Exam   General: Well developed, well nourished, NAD Head: Normal  Neck: Supple Lungs:  CTA, no wheeze Heart: RRR Abdomen: Soft, NT/ ND, no masse Ext: no edema. Radial cath site with no hematoma Neuro: Grossly intact   Labs    Chemistry Recent Labs  Lab 02/06/17 1638 02/06/17 2329 02/08/17 0401  NA 137 136 137  K 4.5 4.6 3.5  CL 98 103 109  CO2 23 25 20*  GLUCOSE 133* 138* 116*  BUN 15 15 9   CREATININE 0.75 0.80 0.69  CALCIUM 10.1 9.7 8.6*  PROT 7.9  --   --   ALBUMIN 4.8  --   --     AST 19  --   --   ALT 16  --   --   ALKPHOS 40  --   --   BILITOT 0.3  --   --   GFRNONAA 95 >60 >60  GFRAA 109 >60 >60  ANIONGAP  --  8 8     Hematology Recent Labs  Lab 02/06/17 1638 02/06/17 2329 02/08/17 0401  WBC 9.1 9.9 7.0  RBC 4.55 4.40 3.70*  HGB 14.3 13.8 11.4*  HCT 41.4 39.8 33.4*  MCV 91 90.5 90.3  MCH 31.4 31.4 30.8  MCHC 34.5 34.7 34.1  RDW 13.4 12.8 13.1  PLT 316 300 256    Cardiac Enzymes Recent Labs  Lab 02/06/17 1636 02/06/17 2329  TROPONINI 0.06* 0.05*    Recent Labs  Lab 02/06/17 2344  TROPIPOC 0.03     Radiology    Dg Chest 2 View  Result Date: 02/06/2017 CLINICAL DATA:  Chest pain EXAM: CHEST  2 VIEW COMPARISON:  Chest radiograph 01/10/2017  FINDINGS: Shallow lung inflation. The heart size and mediastinal contours are within normal limits. Both lungs are clear. The visualized skeletal structures are unremarkable. Status post CABG and mitral valve replacement. IMPRESSION: No active cardiopulmonary disease. Electronically Signed   By: Ulyses Jarred M.D.   On: 02/06/2017 23:52     Telemetry    Sinus- Personally Reviewed  Patient Profile     46 y.o. female with history ofCAD s/p 4VCABG 2011 and mitral valve repair for severe mitral regurgitation, DESx3 in 09/2016, DM, HTN, HLD, former tobacco abuse, chronic diastolic CHF who presented with chest pain.  Assessment & Plan    1. CAD s/p CABG (2011), DES x 3 09/2016 -patient is now status post PCI of ramus intermediate. Continue aspirin, Plavix and statin. She is pain-free.  2. HTN - blood pressure is low. Discontinue amlodipine. Decrease carvedilol to 6.25 mg twice a day. Hold lisinopril today and resume at 10 mg daily tomorrow. Follow blood pressure at home and adjust regimen as needed.  3. Chronic diastolic heart failure - euvolemic on exam. Continue preadmission dose of Lasix at discharge.  4. Hx of mitral valve repair -Echo as outpt  5. HLD - continue crestor  Ambulate this  morning and watch blood pressure. If stable discharge later this evening with transition of care appointment APP in one week.Follow-up Dr. Angelena Form 3 months. > 30 min PA and physician time D2  Signed, Kirk Ruths , MD 7:47 AM 02/08/2017

## 2017-02-08 NOTE — Telephone Encounter (Signed)
TCM Patient-Please call Patient-Pt has an appointment with Melina Copa on 02-15-17.

## 2017-02-08 NOTE — Progress Notes (Signed)
TR BAND REMOVAL  LOCATION:    right radial  DEFLATED PER PROTOCOL:    Yes.    TIME BAND OFF / DRESSING APPLIED:    23:00   SITE UPON ARRIVAL:    Level 0  SITE AFTER BAND REMOVAL:    Level 0  CIRCULATION SENSATION AND MOVEMENT:    Within Normal Limits   Yes.    COMMENTS:   Post TR band instructions given. Patient tolerated well.

## 2017-02-08 NOTE — Discharge Summary (Signed)
Discharge Summary    Patient ID: Elizabeth Park,  MRN: [id number], DOB/AGE: Jun 02, 1970 46 y.o.  Admit date: 02/06/2017 Discharge date: 02/08/2017   Primary Care Provider: Lucianne Lei Primary Cardiologist: Dr. Angelena Form  Discharge Diagnoses    Principal Problem:   ACS (acute coronary syndrome) [facility]) Active Problems:   Type 2 diabetes mellitus with complication ([place])   Hyperlipidemia   TOBACCO ABUSE   MITRAL REGURGITATION   Essential hypertension   CAD, ARTERY BYPASS GRAFT   S/P mitral valve repair   Allergies Allergies  Allergen Reactions  . Metformin Diarrhea and Nausea And Vomiting    Immediate release tablets (not metformin ER, pt currently taking)     History of Present Illness     Elizabeth Park is a 46 y.o. female with a history of CHEST PAIN, RESOLVED, now  Presented  Due to abnormal  Troponin  From  [facility], called  [place] a 46 y.o.femalewith history ofCAD s/p 4VCABG 2011 and mitral valve repair for severe mitral regurgitation, DESx3 in 09/2016, DM, HTN, HLD, former tobacco abuse, chronic diastolic CHF who presents for follow-up.  To recap, cardiac cath 04/01/09 performed by Dr. 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 She also had severe mitral regurgitation. She underwent a a 4V CABG (LIMA to LAD, SVG to subbranch of ramus intermediate, SVG to lateral subbranch of ramus intermediate, SVG to distal RCA) and mitral valve repair January 2011. She had chest pain in June 2018 that felt unlike prior angina;her nuclear stress test suggestedischemia. She underwent cardiac cath on 09/22/16 which showed progression of her CAD with resultant drug eluting stent to the vein graft to the PDA, a drug eluting stent in the native Ramus intermediate branch and a drug eluting stent in the protected left main. LVEF was normal. Last echo in 2016 was  technically difficult, EF 55-60%, grade 1 DD, mildly reduced RV function, s/p mitral valve repair with no significant MR, possible mild mitral stenosis. Last OV with Dr. Angelena Form 7/2018at which time heprescribed PRN Lasix for intermittent LE edema givenprior elevated filling pressures on cath.   She was seen in the ED 01/10/17 with chest discomfort with strange pain in her jaw.It woke her out of a dead sleep and felt like a sharp pain in the middle of her chest going to her back. It lasted about an hour. Due to persistent discomfort she proceeded to the ED. Per ER note, by time of eval she had actually been pain free for a while. Initial troponin was negative, secondtroponinwas borderline at0.03. No further values trended. EKG with nonspecific changes, felt nonacute.She was discharged home with plan for OP follow-up. Otherwise labs showed Hgb 14.4, K 4.3, Cr 0.83.Since that time she's continued to have chest pain almost every day, sometimes several times a day. It hurts down her back and arms. It lasts for several minutes up to 30 minutes. It is also brought on sometimes with exertion. She was unable to go as far as she usually does in cardiac rehab due to this and was asked to make a f/u appt here. She's also noticed it at times while out and about walking longer distances. She says today she actually feels fine and has been out running errands without adverse event, but is worried about it coming back because she's even been losing sleep occasionally due to the pain  as it is worse when she lies on her side, including last night.She feels better when sitting upright and taking deep breaths. She states she saw her PCP who felt she had a viral infection causing inflammation of her chest cavity and was advised to get some sort of shot but she's very skeptical of this (?Toradol). She has no personal or family history of VTE. Pain is not worse with inspiration. She has no lower extremity edema or erythema.  She is not tachycardic, tachypneic or hypoxic.Lipids followed by PCP with LDL of 60 in 12/2015.Of note, her podiatrist has ordered vascular studies on her legs due to weak pedal pulses which is coming up later this month. She does report chronic cramping in her legs when she walks.    Hospital Course     Consultants: none  She underwent heart catheterization with placement of DES to her ramus intermedius.  She will continue ASA, plavix, and statin.   HTN Coreg was decrased to 6.25 mg BID and norvasc was D/C'ed for marginal pressures the morning of discharge. Lisinopril was held today, she will start 10 mg lisinopril tomorrow. She was instructed to log home BP's. Discharge BP was 938 systolic.  Chronic diastolic heart failure She was euvolmeic on discharge. She was continued on her home dose of lasix.   Hx of mitral valve repair She needs an outpatient echo to follow valve function.  Patient seen and examined by Dr. Stanford Breed today and was stable for discharge. All follow up has been arranged.  _____________  Discharge Vitals Blood pressure 117/68, pulse 71, temperature 98.5 F (36.9 C), temperature source Oral, resp. rate 18, height 5\' 4"  (1.626 m), weight 206 lb 5.6 oz (93.6 kg), SpO2 94 %.  Filed Weights   02/07/17 0230 02/07/17 1933 02/08/17 0119  Weight: 206 lb 4.8 oz (93.6 kg) 206 lb 5.6 oz (93.6 kg) 206 lb 5.6 oz (93.6 kg)    Labs & Radiologic Studies    CBC Recent Labs    02/06/17 2329 02/08/17 0401  WBC 9.9 7.0  HGB 13.8 11.4*  HCT 39.8 33.4*  MCV 90.5 90.3  PLT 300 182   Basic Metabolic Panel Recent Labs    02/06/17 2329 02/08/17 0401  NA 136 137  K 4.6 3.5  CL 103 109  CO2 25 20*  GLUCOSE 138* 116*  BUN 15 9  CREATININE 0.80 0.69  CALCIUM 9.7 8.6*   Liver Function Tests Recent Labs    02/06/17 1638  AST 19  ALT 16  ALKPHOS 40  BILITOT 0.3  PROT 7.9  ALBUMIN 4.8   Recent Labs    02/06/17 1638  LIPASE 62   Cardiac Enzymes Recent Labs      02/06/17 1636 02/06/17 2329  TROPONINI 0.06* 0.05*   BNP Invalid input(s): POCBNP D-Dimer No results for input(s): DDIMER in the last 72 hours. Hemoglobin A1C No results for input(s): HGBA1C in the last 72 hours. Fasting Lipid Panel No results for input(s): CHOL, HDL, LDLCALC, TRIG, CHOLHDL, LDLDIRECT in the last 72 hours. Thyroid Function Tests No results for input(s): TSH, T4TOTAL, T3FREE, THYROIDAB in the last 72 hours.  Invalid input(s): FREET3 _____________  Dg Chest 2 View  Result Date: 02/06/2017 CLINICAL DATA:  Chest pain EXAM: CHEST  2 VIEW COMPARISON:  Chest radiograph 01/10/2017 FINDINGS: Shallow lung inflation. The heart size and mediastinal contours are within normal limits. Both lungs are clear. The visualized skeletal structures are unremarkable. Status post CABG and mitral valve replacement. IMPRESSION: No  active cardiopulmonary disease. Electronically Signed   By: Ulyses Jarred M.D.   On: 02/06/2017 23:52   Dg Chest 2 View  Result Date: 01/10/2017 CLINICAL DATA:  Chest discomfort while walking into the cardiac rehab section from the parking lot. Earlier today the patient was awakened with sharp jaw pain and indigestion which have lingered. EXAM: CHEST  2 VIEW COMPARISON:  Chest x-ray of April 06, 2016 FINDINGS: The lungs are adequately inflated and clear. The heart and pulmonary vascularity are normal. The patient has undergone previous CABG and mitral valve replacement. The mediastinum is normal in width. The bony thorax exhibits no acute abnormality. IMPRESSION: There is no CHF nor other acute cardiopulmonary abnormality. Previous CABG and mitral valve replacement. Electronically Signed   By: David  Martinique M.D.   On: 01/10/2017 15:45     Diagnostic Studies/Procedures    Left heart cath 02/07/17:  Prox RCA to Mid RCA lesion is 100% stenosed.  Ost Cx to Prox Cx lesion is 100% stenosed.  Previously placed Ost LM to Ost LAD stent (unknown type) is widely  patent.  Ost Ramus to Ramus lesion is 99% stenosed.  Prox LAD lesion is 100% stenosed.  Origin to Prox Graft lesion is 10% stenosed.  Origin lesion is 100% stenosed.  Origin lesion is 100% stenosed.  A drug-eluting stent was successfully placed using a STENT RESOLUTE ONYX 2.5X15.  Using a BALLOON EMERGE MR 2.0X12.  Post intervention, there is a 0% residual stenosis.   1. Severe 3 vessel occlusive CAD.    - 100% proximal LAD. 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    - 100% proximal RCA 2. Patent LIMA to the LAD 3. Occluded SVG to ramus intermediate x 2 4. Patent SVG to distal RCA 5. Normal LVEDP 6. Successful stenting of the ramus intermediate with DES  Plan: continue DAPT for at least one year. Anticipate DC in am.   Diagnostic Diagram       Post-Intervention Diagram          Disposition   Pt is being discharged home today in good condition.  Follow-up Plans & Appointments    Follow-up Information    Charlie Pitter, PA-C Follow up on 02/15/2017.   Specialties:  Cardiology, Radiology Why:  10:30 am for TCM following heart cath Contact information: [address] [place] [address] [phone]          Discharge Instructions    AMB Referral to Cardiac Rehabilitation - Phase II   Complete by:  As directed    Diagnosis:  Coronary Stents   Amb Referral to Cardiac Rehabilitation   Complete by:  As directed    Diagnosis:  Coronary Stents   Diet - low sodium heart healthy   Complete by:  As directed    Discharge instructions   Complete by:  As directed    No driving for 2 days. No lifting over 5 lbs for 1 week. No sexual activity for 1 week. You may return to work in 1 week. Keep procedure site clean & dry. If you notice increased pain, swelling, bleeding or pus, call/return!  You may  shower, but no soaking baths/hot tubs/pools for 1 week.   Increase activity slowly   Complete by:  As directed       Discharge  Medications   Current Discharge Medication List    CONTINUE these medications which have CHANGED   Details  carvedilol (COREG) 12.5 MG tablet Take 0.5 tablets (6.25 mg total) 2 (two) times daily by mouth.   Associated Diagnoses: Coronary artery disease involving coronary bypass graft of native heart without angina pectoris    lisinopril (PRINIVIL,ZESTRIL) 20 MG tablet Take 0.5 tablets (10 mg total) daily by mouth. Starting on 02/09/17.      CONTINUE these medications which have NOT CHANGED   Details  aspirin 81 MG tablet Take 81 mg by mouth daily.    Associated Diagnoses: Coronary atherosclerosis of unspecified type of vessel, native or graft; Mitral valve insufficiency and aortic valve insufficiency    clopidogrel (PLAVIX) 75 MG tablet Take 1 tablet (75 mg total) by mouth daily with breakfast. Qty: 90 tablet, Refills: 4    fluticasone (FLONASE) 50 MCG/ACT nasal spray Place 1 spray into both nostrils 2 (two) times daily.     furosemide (LASIX) 20 MG tablet TAKE ONE TABLET BY MOUTH DAILY AS NEEDED FOR SWELLING Qty: 30 tablet, Refills: 9    insulin aspart (NOVOLOG FLEXPEN) 100 UNIT/ML injection Inject 0-12 Units into the skin See admin instructions. Per sliding scale Under 150 no units 150-199 take 8 units 200-249 take 10 units 250-299 take 12 units    LYRICA CR 165 MG TB24 Take 165 mg daily by mouth.     montelukast (SINGULAIR) 10 MG tablet Take 10 mg by mouth at bedtime.    Naloxone HCl (EVZIO) 0.4 MG/0.4ML SOAJ Inject 0.4 mg as directed as needed (for possible overdose).     nitroGLYCERIN (NITROSTAT) 0.4 MG SL tablet Place 1 tablet (0.4 mg total) under the tongue every 5 (five) minutes as needed for chest pain. Qty: 25 tablet, Refills: 6    oxyCODONE-acetaminophen (PERCOCET) 10-325 MG per tablet Take 1 tablet by mouth 3 (three) times daily.     rosuvastatin (CRESTOR) 10 MG tablet Take 10 mg by mouth every evening.    Associated Diagnoses: Coronary artery disease involving coronary bypass graft of native heart without angina pectoris    Semaglutide (OZEMPIC) 0.25 or 0.5 MG/DOSE SOPN Inject 25 mg once a week into the skin. On Tuesday    SitaGLIPtin-MetFORMIN HCl (JANUMET XR) 50-1000 MG TB24 Take 1 tablet by mouth 2 (two) times daily.    TRESIBA FLEXTOUCH 100 UNIT/ML SOPN FlexTouch Pen Inject 40 Units as directed at bedtime.  Refills: 4      STOP taking these medications     amLODipine (NORVASC) 5 MG tablet          Aspirin prescribed at discharge?  Yes High Intensity Statin Prescribed? (Lipitor 40-80mg  or Crestor 20-40mg ): Yes Beta Blocker Prescribed? Yes For EF <40%, was ACEI/ARB Prescribed? Yes ADP Receptor Inhibitor Prescribed? (i.e. Plavix etc.-Includes Medically Managed Patients): Yes For EF <40%, Aldosterone Inhibitor Prescribed? No: marginal pressure, normal EF Was EF assessed during THIS hospitalization? no Was Cardiac Rehab II ordered? (Included Medically managed Patients): Yes   Outstanding Labs/Studies   Echo  F/U with [facility] in 3 months  Duration of Discharge Encounter   Greater than 30 minutes including physician time.  Signed, Tami Lin Lisaann Atha PA-C 02/08/2017, 12:35 PM

## 2017-02-08 NOTE — Care Management Note (Signed)
Case Management Note  Patient Details  Name: Elizabeth Park MRN: [id number] Date of Birth: 01-16-1971  Subjective/Objective:  From home, s/p coronary stent intervention, will be on plavix.                    Action/Plan: NCM will follow for dc needs.   Expected Discharge Date:  02/10/17               Expected Discharge Plan:  Home/Self Care  In-House Referral:     Discharge planning Services  CM Consult  Post Acute Care Choice:    Choice offered to:     DME Arranged:    DME Agency:     HH Arranged:    HH Agency:     Status of Service:  Completed, signed off  If discussed at [employer] of Stay Meetings, dates discussed:    Additional Comments:  Zenon Mayo, RN 02/08/2017, 9:34 AM

## 2017-02-08 NOTE — Progress Notes (Signed)
CARDIAC REHAB PHASE I   PRE:  Rate/Rhythm: 71 SR  BP:  Supine: 102/71  Sitting:   Standing:    SaO2: 95%RA  MODE:  Ambulation: 800 ft   POST:  Rate/Rhythm: 84 SR PVC  BP:  Supine:   Sitting: 102/69  Standing:    SaO2: 95%RA 0805-0850 Pt walked 800 ft with steady gait and no dizziness or CP. Wants to go home. Tolerated well. Brief review of ed done as pt is in CRP 2 now. She would like to return. She was just completing the 12 week program. Will have CRP 2 [place] follow up with her regarding if Medicare will cover program again or other options. Reviewed importance of plavix with stent, NTG use and ex ed. She had attended dietary classes downstairs and felt comfortable with ed done. Gave diabetic and heart healthy diet sheets for reference.   Graylon Good, RN BSN  02/08/2017 8:46 AM

## 2017-02-09 ENCOUNTER — Telehealth (HOSPITAL_COMMUNITY): Payer: Self-pay

## 2017-02-09 NOTE — Telephone Encounter (Signed)
Patients insurance and benefits verified through Medicare Part A & B - No co-pay, deductible amount of $183.00/$183.00 has been met, no out of pocket, 20$ co-insurance, and no pre-authorization is required. Passport/reference [phone]  Patient will be contacted for scheduling.

## 2017-02-09 NOTE — Telephone Encounter (Signed)
**Note De-Identified Elizabeth Park Obfuscation** Patient contacted regarding discharge from [facility] on 02/08/17.  Patient understands to follow up with provider Melina Copa, PA-c on at 10:30 at [facility] in [place]. Patient understands discharge instructions? Yes Patient understands medications and regiment? Yes Patient understands to bring all medications to this visit? Yes  The pt states that she is doing ok and denies CP, SOB, dizziness and no discomfort, drainage/bleeding at cath site. She does have [facility]'s phone number to call if she has any questions or concerns.

## 2017-02-14 ENCOUNTER — Encounter: Payer: Self-pay | Admitting: Physician Assistant

## 2017-02-14 ENCOUNTER — Encounter (HOSPITAL_COMMUNITY): Payer: Self-pay | Admitting: Cardiac Rehabilitation

## 2017-02-14 NOTE — Progress Notes (Addendum)
Cardiology Office Note    Date:  02/15/2017  ID:  Elizabeth Park, DOB 1970/05/08, MRN [id number] PCP:  Lucianne Lei, MD  Cardiologist:  Angelena Form   Chief Complaint: f/u cath  History of Present Illness:  Elizabeth Park is a 46 y.o. female with history of CAD s/p 4VCABG 2011 and mitral valve repair for severe mitral regurgitation, DESx3 in 09/2016, DES to ramus intermediate in 02/2017, DM, HTN, HLD, former tobacco abuse, chronic diastolic CHF who presents for follow-up.  To recap, cardiac cath 04/01/09 performed by Dr. 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 She also had severe mitral regurgitation. She underwent a a 4V CABG  (LIMA to LAD, SVG to subbranch of ramus intermediate, SVG to lateral subbranch of ramus intermediate, SVG to distal RCA) and mitral valve repair January 2011. She had chest pain in June 2018 that felt unlike prior angina; her nuclear stress test suggested ischemia. She underwent cardiac cath on 09/22/16 which showed progression of her CAD with resultant drug eluting stent to the vein graft to the PDA, a drug eluting stent in the native Ramus intermediate branch and a drug eluting stent in the protected left main. LVEF was normal. Last echo in 2016 was technically difficult, EF 55-60%, grade 1 DD, mildly reduced RV function, s/p mitral valve repair with no significant MR, possible mild mitral stenosis. She had prior OV with Dr. Angelena Form 10/2016 at which time he prescribed PRN Lasix for intermittent LE edema given prior elevated filling pressures on cath.   She was recently seen in the ED 01/10/17 with chest discomfort with strange pain in her jaw. It woke her out of a dead sleep and felt like a sharp pain in the middle of her chest going to her back. It lasted about an hour. Due to persistent discomfort she proceeded to the ED. Per ER note, by time of eval she had actually been pain  free for a while. Initial troponin was negative, second troponin was borderline at 0.03. No further values trended. EKG with nonspecific changes, felt nonacute. She was discharged home with plan for OP follow-up. When seen in the cardiology office 02/06/17 she was still having intermittent chest pain with mixed atypical/typical features. Initially elective heart cath was arranged but outpatient troponin was positive so she was asked to present to the ED that night. Cardiac cath 02/07/17 showed severe 3 vessel disease, patent LIMA-LAD, occluded SVG-ramus x 2, patent SVG-dRCA, normal LVEDP, s/p DES to ramus intermediate. It was recommended to continue DAPT for at least one year. Lisinopril and Coreg were decreased due to hypotension. Labs notable for Hgb 11.4, Cr 0.69, LFTs wnl. Prior LDL in 12/2015 by PCP was 60. . Of note, her podiatrist has ordered vascular studies on her legs due to weak pedal pulses which is coming up later this month. She does report chronic cramping in her legs when she walks.  She returns for follow-up overall feeling much better. She has not had any further episodes of chest pain whatsoever. Breathing is normal. She did develop a hacky cough after discharge but this is similar to multiple people in the community. No palpitations, dizziness. Has noticed BP continuing to run low down to low 100s at home. She reports it was as low as the 70s at times in the hospital, prompting discontinuation of amlodipine.   Past Medical History:  Diagnosis Date  . Chronic diastolic CHF (congestive heart  failure) ([place])   . Coronary artery disease    a. 4v CABG  (LIMA to LAD, SVG to subbranch of ramus intermediate, SVG to lateral subbranch of ramus intermediate, SVG to distal RCA) and mitral valve repair January 2011. b. Abnormal nuc 09/2016 s/p  drug eluting stent to the vein graft to the PDA, a drug eluting stent in the native Ramus intermediate branch and a drug eluting stent in the protected left main.  c. DES to ramus intermediate 02/2017.  . Diabetes mellitus   . H/O emotional problems   . Headache, migraine   . High cholesterol   . History of blood transfusion   . Hypertension   . MI, old   . MRSA (methicillin resistant Staphylococcus aureus)   . MVP (mitral valve prolapse)   . Neuromuscular disorder ([place])   . Neuropathy   . S/P mitral valve repair   . Sebaceous cyst     Past Surgical History:  Procedure Laterality Date  . ABDOMINAL HYSTERECTOMY    . CARDIAC CATHETERIZATION  09/22/2016  . CORONARY ARTERY BYPASS GRAFT  with valve repair  . CORONARY STENT INTERVENTION N/A 09/22/2016   Procedure: Coronary Stent Intervention;  Surgeon: Burnell Blanks, MD;  Location: [place] [facility];  Service: Cardiovascular;  Laterality: N/A;  . CORONARY STENT INTERVENTION N/A 02/07/2017   Procedure: CORONARY STENT INTERVENTION;  Surgeon: Martinique, Peter M, MD;  Location: [place] [facility];  Service: Cardiovascular;  Laterality: N/A;  . CORONARY STENT PLACEMENT  09/22/2016  . LEFT HEART CATH AND CORS/GRAFTS ANGIOGRAPHY N/A 09/22/2016   Procedure: Left Heart Cath and Cors/Grafts Angiography;  Surgeon: Burnell Blanks, MD;  Location: [place] [facility];  Service: Cardiovascular;  Laterality: N/A;  . LEFT HEART CATH AND CORS/GRAFTS ANGIOGRAPHY N/A 02/07/2017   Procedure: LEFT HEART CATH AND CORS/GRAFTS ANGIOGRAPHY;  Surgeon: Martinique, Peter M, MD;  Location: [place] [facility];  Service: Cardiovascular;  Laterality: N/A;  . SKIN GRAFT      Current Medications: Current Meds  Medication Sig  . aspirin 81 MG tablet Take 81 mg by mouth daily.   . carvedilol (COREG) 6.25 MG tablet Take 6.25 mg 2 (two) times daily with a meal by mouth.  . clopidogrel (PLAVIX) 75 MG tablet Take 1 tablet (75 mg total) by mouth daily with breakfast.  . fluticasone (FLONASE) 50 MCG/ACT nasal spray Place 1 spray into both nostrils 2 (two) times daily.   . furosemide (LASIX) 20 MG tablet TAKE ONE TABLET BY MOUTH  DAILY AS NEEDED FOR SWELLING  . insulin aspart (NOVOLOG FLEXPEN) 100 UNIT/ML injection Inject 0-12 Units into the skin See admin instructions. Per sliding scale Under 150 no units 150-199 take 8 units 200-249 take 10 units 250-299 take 12 units  . lisinopril (PRINIVIL,ZESTRIL) 10 MG tablet Take 10 mg daily by mouth.  Marland Kitchen LYRICA CR 165 MG TB24 Take 165 mg daily by mouth.   . montelukast (SINGULAIR) 10 MG tablet Take 10 mg by mouth at bedtime.  . Naloxone HCl (EVZIO) 0.4 MG/0.4ML SOAJ Inject 0.4 mg as directed as needed (for possible overdose).   . nitroGLYCERIN (NITROSTAT) 0.4 MG SL tablet Place 1 tablet (0.4 mg total) under the tongue every 5 (five) minutes as needed for chest pain.  Marland Kitchen oxyCODONE-acetaminophen (PERCOCET) 10-325 MG per tablet Take 1 tablet by mouth 3 (three) times daily.  . rosuvastatin (CRESTOR) 10 MG tablet Take 10 mg by mouth every evening.   . Semaglutide 1 MG/DOSE SOPN Inject 1 mg  once a week into the skin.  . SitaGLIPtin-MetFORMIN HCl (JANUMET XR) 50-1000 MG TB24 Take 1 tablet by mouth 2 (two) times daily.  . TRESIBA FLEXTOUCH 100 UNIT/ML SOPN FlexTouch Pen Inject 40 Units as directed at bedtime.   . [DISCONTINUED] lisinopril (PRINIVIL,ZESTRIL) 40 MG tablet Take 40 mg daily by mouth.     Allergies:   Metformin   Social History   Socioeconomic History  . Marital status: Divorced    Spouse name: None  . Number of children: 2  . Years of education: None  . Highest education level: None  Social Needs  . Financial resource strain: None  . Food insecurity - worry: None  . Food insecurity - inability: None  . Transportation needs - medical: None  . Transportation needs - non-medical: None  Occupational History  . Occupation: Disability  Tobacco Use  . Smoking status: Former Smoker    Packs/day: 1.00    Years: 28.00    Pack years: 28.00    Types: Cigarettes    Last attempt to quit: 03/31/2009    Years since quitting: 7.8  . Smokeless tobacco: Never Used    Substance and Sexual Activity  . Alcohol use: No  . Drug use: No  . Sexual activity: Yes    Birth control/protection: Surgical    Comment: partial hysterectomy  Other Topics Concern  . None  Social History Narrative  . None     Family History:  Family History  Problem Relation Age of Onset  . Heart disease Father   . Asthma Father   . Hypertension Father   . Diabetes Father   . Arthritis Father   . Heart disease Mother   . Hypertension Mother   . Diabetes Mother   . Migraines Mother   . Stroke Mother   . Emphysema Maternal Grandmother   . Hypertension Brother   . Diabetes Brother    \  ROS:   Please see the history of present illness. No hemoptysis, fever, chills, nasal drainage All other systems are reviewed and otherwise negative.    PHYSICAL EXAM:   VS:  BP 106/64   Pulse 66   Resp 16   Ht 5\' 4"  (1.626 m)   Wt 204 lb 6.4 oz (92.7 kg)   BMI 35.09 kg/m   BMI: Body mass index is 35.09 kg/m. GEN: Well nourished, well developed WF in no acute distress  HEENT: normocephalic, atraumatic Neck: no JVD, carotid bruits, or masses Cardiac: RRR; no murmurs, rubs, or gallops, no edema  Respiratory:  clear to auscultation bilaterally, normal work of breathing GI: soft, nontender, nondistended, + BS MS: no atrophy; prior well healed deformity of chest wall where breast tissue surrounds sternal scar Skin: warm and dry, no rash. Left radial cath site without hematoma or ecchymosis; good pulse. Neuro:  Alert and Oriented x 3, Strength and sensation are intact, follows commands Psych: euthymic mood, full affect  Wt Readings from Last 3 Encounters:  02/15/17 204 lb 6.4 oz (92.7 kg)  02/08/17 206 lb 5.6 oz (93.6 kg)  02/06/17 206 lb 12.8 oz (93.8 kg)      Studies/Labs Reviewed:   EKG:  EKG was ordered today and personally reviewed by me and demonstrates NSR 66bpm nonspecific ST-T changes nonacute  Recent Labs: 02/06/2017: ALT 16 02/08/2017: BUN 9; Creatinine, Ser  0.69; Hemoglobin 11.4; Platelets 256; Potassium 3.5; Sodium 137   Lipid Panel    Component Value Date/Time   CHOL (H) 04/03/2009 0320  204        ATP III CLASSIFICATION:  <200     mg/dL   Desirable  200-239  mg/dL   Borderline High  >=240    mg/dL   High          TRIG 269 (H) 04/03/2009 0320   HDL 23 (L) 04/03/2009 0320   CHOLHDL 8.9 04/03/2009 0320   VLDL 54 (H) 04/03/2009 0320   LDLCALC (H) 04/03/2009 0320    127        Total Cholesterol/HDL:CHD Risk Coronary Heart Disease Risk Table                     Men   Women  1/2 Average Risk   3.4   3.3  Average Risk       5.0   4.4  2 X Average Risk   9.6   7.1  3 X Average Risk  23.4   11.0        Use the calculated Patient Ratio above and the CHD Risk Table to determine the patient's CHD Risk.        ATP III CLASSIFICATION (LDL):  <100     mg/dL   Optimal  100-129  mg/dL   Near or Above                    Optimal  130-159  mg/dL   Borderline  160-189  mg/dL   High  >190     mg/dL   Very High    Additional studies/ records that were reviewed today include: Summarized above.    ASSESSMENT & PLAN:   1. CAD - doing well s/p PCI. Atypical chest pain now resolved. Continue DAPT for at least one year. With 4 interventions this year, may need to consider indefinite therapy if tolerated but will defer to Dr. Angelena Form. Continue BB. Titrate statin as below. 2. H/o mitral valve repair - as symptoms completely resolved with PCI, will hold off echo. Further monitoring per Dr. Angelena Form. No significant murmurs on exam today. 3. Chronic diastolic CHF - appears euvolemic. 4. HTN - more recently hypotensive. She continues to have softer BPs at home. We have refilled her carvedilol for the lower dose (instead of cutting the 12.5mg  tablets), but I will further reduce lisinopril to 5mg  daily. Asked her to continue to monitor at home and call if any problems. 5. Hyperlipidemia - per guidelines will titrate Crestor to 40mg  daily (independent  of LDL - has had progressive CAD). Check liver/lipids in 8 weeks fasting.  Disposition: F/u with Dr. Angelena Form as scheduled 05/2016. Also advised f/u PCP if cough persists.   Medication Adjustments/Labs and Tests Ordered: Current medicines are reviewed at length with the patient today.  Concerns regarding medicines are outlined above. Medication changes, Labs and Tests ordered today are summarized above and listed in the Patient Instructions accessible in Encounters.   Signed, Charlie Pitter, PA-C  02/15/2017 11:03 AM    [facility] [address], [place], [place]  [address] Phone: [phone]; Fax: [phone]

## 2017-02-15 ENCOUNTER — Ambulatory Visit (INDEPENDENT_AMBULATORY_CARE_PROVIDER_SITE_OTHER): Payer: Medicare Other | Admitting: Physician Assistant

## 2017-02-15 ENCOUNTER — Encounter: Payer: Self-pay | Admitting: Physician Assistant

## 2017-02-15 VITALS — BP 106/64 | HR 66 | Resp 16 | Ht 64.0 in | Wt 204.4 lb

## 2017-02-15 DIAGNOSIS — Z9889 Other specified postprocedural states: Secondary | ICD-10-CM | POA: Diagnosis not present

## 2017-02-15 DIAGNOSIS — Z9861 Coronary angioplasty status: Secondary | ICD-10-CM | POA: Diagnosis not present

## 2017-02-15 DIAGNOSIS — E785 Hyperlipidemia, unspecified: Secondary | ICD-10-CM

## 2017-02-15 DIAGNOSIS — I2510 Atherosclerotic heart disease of native coronary artery without angina pectoris: Secondary | ICD-10-CM | POA: Diagnosis not present

## 2017-02-15 DIAGNOSIS — I25810 Atherosclerosis of coronary artery bypass graft(s) without angina pectoris: Secondary | ICD-10-CM

## 2017-02-15 DIAGNOSIS — I10 Essential (primary) hypertension: Secondary | ICD-10-CM | POA: Diagnosis not present

## 2017-02-15 DIAGNOSIS — I5032 Chronic diastolic (congestive) heart failure: Secondary | ICD-10-CM

## 2017-02-15 MED ORDER — LISINOPRIL 5 MG PO TABS: 5.0000 mg | ORAL_TABLET | Freq: Every day | ORAL | 6 refills | Status: DC

## 2017-02-15 MED ORDER — CARVEDILOL 6.25 MG PO TABS: 6.2500 mg | ORAL_TABLET | Freq: Two times a day (BID) | ORAL | 6 refills | Status: DC

## 2017-02-15 MED ORDER — ROSUVASTATIN CALCIUM 40 MG PO TABS: 40.0000 mg | ORAL_TABLET | Freq: Every evening | ORAL | 6 refills | Status: DC

## 2017-02-15 NOTE — Patient Instructions (Signed)
Medication Instructions: Your physician has recommended you make the following change in your medication:  -1) INCREASE Rosuvastatin (Crestor) 40 mg - Take 1 tablet (40 mg) by mouth daily - NEW RX SENT -2) DECREASE Lisinopril 5 mg - Take 1 tablet (5 mg) by mouth daily - NEW RX SENT -- Your Coreg RX has been sent for 6.25 mg - Take 1 tablet by mouth twice daily with a meal  Labwork: Your physician recommends that you return for lab work in: 8 weeks for FASTING Lipid and Hepatic Function Test  Procedures/Testing: None Ordered  Follow-Up: Your physician recommends that you keep your follow-up appointment as scheduled with Dr. Angelena Form  If you need a refill on your cardiac medications before your next appointment, please call your pharmacy.

## 2017-02-19 ENCOUNTER — Telehealth (HOSPITAL_COMMUNITY): Payer: Self-pay

## 2017-02-19 DIAGNOSIS — M545 Low back pain: Secondary | ICD-10-CM | POA: Diagnosis not present

## 2017-02-19 DIAGNOSIS — M79605 Pain in left leg: Secondary | ICD-10-CM | POA: Diagnosis not present

## 2017-02-19 DIAGNOSIS — G894 Chronic pain syndrome: Secondary | ICD-10-CM | POA: Diagnosis not present

## 2017-02-19 DIAGNOSIS — M79662 Pain in left lower leg: Secondary | ICD-10-CM | POA: Diagnosis not present

## 2017-02-19 DIAGNOSIS — Z79891 Long term (current) use of opiate analgesic: Secondary | ICD-10-CM | POA: Diagnosis not present

## 2017-02-19 DIAGNOSIS — M79661 Pain in right lower leg: Secondary | ICD-10-CM | POA: Diagnosis not present

## 2017-02-19 NOTE — Telephone Encounter (Signed)
Attempted to call patient in regards to Cardiac Rehab - Lm on Vm °

## 2017-02-20 ENCOUNTER — Telehealth: Payer: Self-pay | Admitting: Cardiovascular Disease

## 2017-02-20 ENCOUNTER — Telehealth (HOSPITAL_COMMUNITY): Payer: Self-pay

## 2017-02-20 DIAGNOSIS — E785 Hyperlipidemia, unspecified: Secondary | ICD-10-CM | POA: Diagnosis not present

## 2017-02-20 DIAGNOSIS — I110 Hypertensive heart disease with heart failure: Secondary | ICD-10-CM | POA: Diagnosis not present

## 2017-02-20 DIAGNOSIS — E0842 Diabetes mellitus due to underlying condition with diabetic polyneuropathy: Secondary | ICD-10-CM | POA: Diagnosis not present

## 2017-02-20 DIAGNOSIS — I10 Essential (primary) hypertension: Secondary | ICD-10-CM | POA: Diagnosis not present

## 2017-02-20 NOTE — Telephone Encounter (Signed)
We have not helped with Repatha approval / have not been managing patient's lipids but if PCP office already has approval for Repatha, this is perfectly fine to start. Returned call to Dr Fransico Setters office and [facility] to advise them of this.

## 2017-02-20 NOTE — Telephone Encounter (Signed)
Patient returned phone call in regards to Cardiac Rehab - Patient is interested in the program. Scheduled orientation on 03/13/2017 at 8:30am. Patient will attend the 11:15am exc class.

## 2017-02-20 NOTE — Telephone Encounter (Signed)
New message    Call from [facility] at  Dr Criss Rosales office [phone] ext [phone], calling to question if patient can start  [place]. Please call      1.  Are you calling in regards to an appointment? yes  2.  Are you calling for a refill ? no  3.  Are you having bleeding issues? no  4.  Do you need clearance to hold Coumadin? no   Please route to the Coumadin Clinic Pool

## 2017-02-21 ENCOUNTER — Ambulatory Visit (HOSPITAL_COMMUNITY)
Admission: RE | Admit: 2017-02-21 | Discharge: 2017-02-21 | Disposition: A | Discharge status: Home / Self Care | Payer: Medicare Other | Source: Ambulatory Visit | Attending: Cardiology | Admitting: Cardiology

## 2017-02-21 DIAGNOSIS — E1342 Other specified diabetes mellitus with diabetic polyneuropathy: Secondary | ICD-10-CM | POA: Diagnosis not present

## 2017-02-21 DIAGNOSIS — R0989 Other specified symptoms and signs involving the circulatory and respiratory systems: Secondary | ICD-10-CM | POA: Diagnosis not present

## 2017-02-28 NOTE — Addendum Note (Signed)
Encounter addended by: Jewel Baize, RD on: 02/28/2017 10:00 AM  Actions taken: Flowsheet data copied forward, Visit Navigator Flowsheet section accepted

## 2017-03-02 ENCOUNTER — Ambulatory Visit (HOSPITAL_COMMUNITY): Payer: Self-pay | Admitting: Cardiac Rehabilitation

## 2017-03-02 NOTE — Progress Notes (Signed)
Discharge Progress Report  Patient Details  Name: Elizabeth Park MRN: [id number] Date of Birth: 22-Dec-1970 Referring Provider:     [place] from 10/26/2016 in [place]  Referring Provider  Lauree Chandler MD       Number of Visits: 23  Reason for Discharge:  Early exit, restenosis.  Pt plans to return to cardiac rehab once medically cleared by cardiology.  Smoking History:  Social History   Tobacco Use  Smoking Status Former Smoker  . Packs/day: 1.00  . Years: 28.00  . Pack years: 28.00  . Types: Cigarettes  . Last attempt to quit: 03/31/2009  . Years since quitting: 7.9  Smokeless Tobacco Never Used    Diagnosis:  No diagnosis found.  ADL UCSD:   Initial Exercise Prescription:   Discharge Exercise Prescription (Final Exercise Prescription Changes): Exercise Prescription Changes - 02/05/17 1550      Response to Exercise   Blood Pressure (Admit)  96/50    Blood Pressure (Exercise)  92/60    Blood Pressure (Exit)  94/68    Heart Rate (Admit)  75 bpm    Heart Rate (Exercise)  95 bpm    Heart Rate (Exit)  74 bpm    Rating of Perceived Exertion (Exercise)  11    Symptoms  none    Comments  pt BP was low at start, with exercise and post. Pt asymptomatic and encouraged hydration    Duration  Continue with 30 min of aerobic exercise without signs/symptoms of physical distress.    Intensity  THRR unchanged      Progression   Progression  Continue to progress workloads to maintain intensity without signs/symptoms of physical distress.    Average METs  2.8      Resistance Training   Training Prescription  Yes    Weight  3lbs    Reps  10-15    Time  10 Minutes      Interval Training   Interval Training  No      Treadmill   MPH  2.6    Grade  1    Minutes  15    METs  3.35      NuStep   Level  5    SPM  80    Minutes  15    METs  2.3      Home Exercise Plan   Plans to continue exercise at   Home (comment) walking on treadmill    Frequency  Add 2 additional days to program exercise sessions.    Initial Home Exercises Provided  11/22/16       Functional Capacity:   Psychological, QOL, Others - Outcomes: PHQ 2/9: Depression screen [facility] 2/9 11/01/2016 08/09/2016 09/13/2015  Decreased Interest 0 0 0  Down, Depressed, Hopeless 0 0 0  PHQ - 2 Score 0 0 0    Quality of Life:   Personal Goals: Goals established at orientation with interventions provided to work toward goal.    Personal Goals Discharge: Goals and Risk Factor Review    Row Name 01/09/17 0716 02/02/17 1156 02/14/17 1633 02/28/17 0959       Core Components/Risk Factors/Patient Goals Review   Personal Goals Review  Diabetes;Hypertension;Lipids  Diabetes;Hypertension;Lipids  Diabetes;Hypertension;Lipids  Weight Management/Obesity    Review  pt with multiple CAD RF displays eagerness to participate in CR exercise, nutrition and lifestyle education. pt blood sugar normalizing with current treatment.  pt BP WNL  pt with  multiple CAD RF displays eagerness to participate in CR exercise, nutrition and lifestyle education. pt blood sugar normalizing with current treatment.  pt BP WNL. pt notes increased stamina with household activities.  pt working towards improving CAD RF. pt blood sugar normalizing and maintaining BP WNL.    Pt has lost 6.6 lb. Minimum wt loss goal met.     Expected Outcomes  pt will participate in CR exercise,nutrition, and lifestyle education opportunities.    pt will participate in CR exercise,nutrition, and lifestyle education opportunities.    pt will be independent with exercise,nutrition, and lifestyle modification practices.   Continue to encourage slow wt loss of 1-2 lb/week to wt loss goal wt of 175 lb.        Exercise Goals and Review:   Nutrition & Weight - Outcomes:    Nutrition:   Nutrition Discharge:   Education Questionnaire Score:   Goals reviewed with patient; copy given to  patient.

## 2017-03-06 ENCOUNTER — Telehealth (HOSPITAL_COMMUNITY): Payer: Self-pay | Admitting: Pharmacist

## 2017-03-13 ENCOUNTER — Inpatient Hospital Stay (HOSPITAL_COMMUNITY): Admission: RE | Admit: 2017-03-13 | Payer: Self-pay | Source: Ambulatory Visit

## 2017-03-13 NOTE — Progress Notes (Signed)
Discharge Progress Report  Patient Details  Name: Elizabeth Park MRN: [id number] Date of Birth: March 01, 1971 Referring Provider:     [facility] from 10/26/2016 in [place]  Referring Provider  Lauree Chandler MD       Number of Visits: 18  Reason for Discharge:    Smoking History:  Social History   Tobacco Use  Smoking Status Former Smoker  . Packs/day: 1.00  . Years: 28.00  . Pack years: 28.00  . Types: Cigarettes  . Last attempt to quit: 03/31/2009  . Years since quitting: 7.9  Smokeless Tobacco Never Used    Diagnosis:  Status post coronary artery stent placement  ADL UCSD:   Initial Exercise Prescription: Initial Exercise Prescription - 10/26/16 1500      Date of Initial Exercise RX and Referring Provider   Date  10/26/16    Referring Provider  Lauree Chandler MD      Treadmill   MPH  2.5    Grade  0    Minutes  10    METs  2.5      Bike   Level  1    Minutes  10    METs  3.1      NuStep   Level  3    SPM  80    Minutes  10    METs  2.9      Prescription Details   Frequency (times per week)  3    Duration  Progress to 45 minutes of aerobic exercise without signs/symptoms of physical distress      Intensity   THRR 40-80% of Max Heartrate  69-138    Ratings of Perceived Exertion  11-13    Perceived Dyspnea  0-4      Progression   Progression  Continue to progress workloads to maintain intensity without signs/symptoms of physical distress.      Resistance Training   Training Prescription  Yes    Weight  2    Reps  10-15       Discharge Exercise Prescription (Final Exercise Prescription Changes): Exercise Prescription Changes - 02/05/17 1550      Response to Exercise   Blood Pressure (Admit)  96/50    Blood Pressure (Exercise)  92/60    Blood Pressure (Exit)  94/68    Heart Rate (Admit)  75 bpm    Heart Rate (Exercise)  95 bpm    Heart Rate (Exit)  74 bpm    Rating  of Perceived Exertion (Exercise)  11    Symptoms  none    Comments  pt BP was low at start, with exercise and post. Pt asymptomatic and encouraged hydration    Duration  Continue with 30 min of aerobic exercise without signs/symptoms of physical distress.    Intensity  THRR unchanged      Progression   Progression  Continue to progress workloads to maintain intensity without signs/symptoms of physical distress.    Average METs  2.8      Resistance Training   Training Prescription  Yes    Weight  3lbs    Reps  10-15    Time  10 Minutes      Interval Training   Interval Training  No      Treadmill   MPH  2.6    Grade  1    Minutes  15    METs  3.35      NuStep  Level  5    SPM  80    Minutes  15    METs  2.3      Home Exercise Plan   Plans to continue exercise at  Home (comment) walking on treadmill    Frequency  Add 2 additional days to program exercise sessions.    Initial Home Exercises Provided  11/22/16       Functional Capacity: 6 Minute Walk    Row Name 10/26/16 1501         6 Minute Walk   Phase  Initial     Distance  1600 feet     Walk Time  6 minutes     # of Rest Breaks  0     MPH  3     METS  3.9     RPE  11     VO2 Peak  13.7     Symptoms  No     Resting HR  64 bpm     Resting BP  110/72     Max Ex. HR  83 bpm     Max Ex. BP  117/69     2 Minute Post BP  117/77        Psychological, QOL, Others - Outcomes: PHQ 2/9: Depression screen [facility] 2/9 11/01/2016 08/09/2016 09/13/2015  Decreased Interest 0 0 0  Down, Depressed, Hopeless 0 0 0  PHQ - 2 Score 0 0 0    Quality of Life: Quality of Life - 11/24/16 1535      Quality of Life Scores   Health/Function Pre  -- pt reports decreased overall symptoms c/o increased anxiety related dyspnea.  pt also concerned about diabetes medications. discussed with Derek Mound, RD, CDE who will schedule to meet with pt to discuss diabetic regimen.     Socioeconomic Pre  -- pt interested in vocational rehab.  pt is also pursuing disability    GLOBAL Pre  -- pt offered emotional support and reassurance.  Dr. Criss Rosales manages diabetes, next f/u 12/27/2016.  pt instructed to discuss lipid questions with Dr. Angelena Form       Personal Goals: Goals established at orientation with interventions provided to work toward goal. Personal Goals and Risk Factors at Admission - 11/01/16 1636      Core Components/Risk Factors/Patient Goals on Admission    Weight Management  Obesity;Yes    Intervention  Weight Management: Develop a combined nutrition and exercise program designed to reach desired caloric intake, while maintaining appropriate intake of nutrient and fiber, sodium and fats, and appropriate energy expenditure required for the weight goal.;Weight Management: Provide education and appropriate resources to help participant work on and attain dietary goals.;Weight Management/Obesity: Establish reasonable short term and long term weight goals.;Obesity: Provide education and appropriate resources to help participant work on and attain dietary goals.    Admit Weight  210 lb 15.7 oz (95.7 kg)    Expected Outcomes  Short Term: Continue to assess and modify interventions until short term weight is achieved;Long Term: Adherence to nutrition and physical activity/exercise program aimed toward attainment of established weight goal;Weight Loss: Understanding of general recommendations for a balanced deficit meal plan, which promotes 1-2 lb weight loss per week and includes a negative energy balance of [phone] kcal/d;Understanding recommendations for meals to include 15-35% energy as protein, 25-35% energy from fat, 35-60% energy from carbohydrates, less than 228m of dietary cholesterol, 20-35 gm of total fiber daily    Diabetes  Yes    Intervention  Provide education about signs/symptoms and action to take for hypo/hyperglycemia.;Provide education about proper nutrition, including hydration, and aerobic/resistive exercise  prescription along with prescribed medications to achieve blood glucose in normal ranges: Fasting glucose 65-99 mg/dL    Expected Outcomes  Short Term: Participant verbalizes understanding of the signs/symptoms and immediate care of hyper/hypoglycemia, proper foot care and importance of medication, aerobic/resistive exercise and nutrition plan for blood glucose control.;Long Term: Attainment of HbA1C < 7%.    Hypertension  Yes    Intervention  Provide education on lifestyle modifcations including regular physical activity/exercise, weight management, moderate sodium restriction and increased consumption of fresh fruit, vegetables, and low fat dairy, alcohol moderation, and smoking cessation.;Monitor prescription use compliance.    Expected Outcomes  Short Term: Continued assessment and intervention until BP is < 140/77m HG in hypertensive participants. < 130/81mHG in hypertensive participants with diabetes, heart failure or chronic kidney disease.;Long Term: Maintenance of blood pressure at goal levels.    Lipids  Yes    Intervention  Provide education and support for participant on nutrition & aerobic/resistive exercise along with prescribed medications to achieve LDL <7031mHDL >82m43m  Expected Outcomes  Short Term: Participant states understanding of desired cholesterol values and is compliant with medications prescribed. Participant is following exercise prescription and nutrition guidelines.;Long Term: Cholesterol controlled with medications as prescribed, with individualized exercise RX and with personalized nutrition plan. Value goals: LDL < 70mg9mL > 40 mg.        Personal Goals Discharge: Goals and Risk Factor Review    Row Name 11/16/16 1131 12/13/16 1818 01/09/17 0716 02/02/17 1156 02/14/17 1633     Core Components/Risk Factors/Patient Goals Review   Personal Goals Review  Diabetes;Hypertension;Lipids  Diabetes;Hypertension;Lipids  Diabetes;Hypertension;Lipids   Diabetes;Hypertension;Lipids  Diabetes;Hypertension;Lipids   Review  pt with multiple CAD RF displays eagerness to participate in CR exercise, nutrition and lifestyle education.  pt with multiple CAD RF displays eagerness to participate in CR exercise, nutrition and lifestyle education.  pt with multiple CAD RF displays eagerness to participate in CR exercise, nutrition and lifestyle education. pt blood sugar normalizing with current treatment.  pt BP WNL  pt with multiple CAD RF displays eagerness to participate in CR exercise, nutrition and lifestyle education. pt blood sugar normalizing with current treatment.  pt BP WNL. pt notes increased stamina with household activities.  pt working towards improving CAD RF. pt blood sugar normalizing and maintaining BP WNL.     Expected Outcomes  pt will participate in CR exercise,nutrition, and lifestyle education opportunities.    pt will participate in CR exercise,nutrition, and lifestyle education opportunities.    pt will participate in CR exercise,nutrition, and lifestyle education opportunities.    pt will participate in CR exercise,nutrition, and lifestyle education opportunities.    pt will be independent with exercise,nutrition, and lifestyle modification practices.    Row Name 02/28/17 0959             Core Components/Risk Factors/Patient Goals Review   Personal Goals Review  Weight Management/Obesity       Review  Pt has lost 6.6 lb. Minimum wt loss goal met.        Expected Outcomes  Continue to encourage slow wt loss of 1-2 lb/week to wt loss goal wt of 175 lb.           Exercise Goals and Review: Exercise Goals    Row Name 10/26/16 1416 12/12/16 1439  Exercise Goals   Increase Physical Activity  Yes  (Pended)   Yes      Intervention  Provide advice, education, support and counseling about physical activity/exercise needs.;Develop an individualized exercise prescription for aerobic and resistive training based on initial  evaluation findings, risk stratification, comorbidities and participant's personal goals.  (Pended)   Provide advice, education, support and counseling about physical activity/exercise needs.;Develop an individualized exercise prescription for aerobic and resistive training based on initial evaluation findings, risk stratification, comorbidities and participant's personal goals.      Expected Outcomes  Achievement of increased cardiorespiratory fitness and enhanced flexibility, muscular endurance and strength shown through measurements of functional capacity and personal statement of participant.  (Pended)   Achievement of increased cardiorespiratory fitness and enhanced flexibility, muscular endurance and strength shown through measurements of functional capacity and personal statement of participant.      Increase Strength and Stamina  Yes  (Pended)   Yes      Intervention  Provide advice, education, support and counseling about physical activity/exercise needs.;Develop an individualized exercise prescription for aerobic and resistive training based on initial evaluation findings, risk stratification, comorbidities and participant's personal goals.  (Pended)   Provide advice, education, support and counseling about physical activity/exercise needs.;Develop an individualized exercise prescription for aerobic and resistive training based on initial evaluation findings, risk stratification, comorbidities and participant's personal goals.      Expected Outcomes  Achievement of increased cardiorespiratory fitness and enhanced flexibility, muscular endurance and strength shown through measurements of functional capacity and personal statement of participant.  (Pended)   Achievement of increased cardiorespiratory fitness and enhanced flexibility, muscular endurance and strength shown through measurements of functional capacity and personal statement of participant.      Able to understand and use rate of perceived  exertion (RPE) scale  -  Yes      Intervention  -  Provide education and explanation on how to use RPE scale      Expected Outcomes  -  Short Term: Able to use RPE daily in rehab to express subjective intensity level;Long Term:  Able to use RPE to guide intensity level when exercising independently      Knowledge and understanding of Target Heart Rate Range (THRR)  -  Yes      Intervention  -  Provide education and explanation of THRR including how the numbers were predicted and where they are located for reference      Expected Outcomes  -  Short Term: Able to state/look up THRR;Long Term: Able to use THRR to govern intensity when exercising independently;Short Term: Able to use daily as guideline for intensity in rehab      Able to check pulse independently  -  Yes      Intervention  -  Provide education and demonstration on how to check pulse in carotid and radial arteries.;Review the importance of being able to check your own pulse for safety during independent exercise      Expected Outcomes  -  Short Term: Able to explain why pulse checking is important during independent exercise;Long Term: Able to check pulse independently and accurately      Understanding of Exercise Prescription  -  Yes      Intervention  -  Provide education, explanation, and written materials on patient's individual exercise prescription      Expected Outcomes  -  Short Term: Able to explain program exercise prescription;Long Term: Able to explain home exercise prescription to exercise independently  Nutrition & Weight - Outcomes: Pre Biometrics - 10/26/16 1514      Pre Biometrics   Waist Circumference  44.5 inches    Hip Circumference  47.5 inches    Waist to Hip Ratio  0.94 %    Triceps Skinfold  15 mm    % Body Fat  41.9 %    Grip Strength  40 kg    Flexibility  8.5 in    Single Leg Stand  28.8 seconds        Nutrition: Nutrition Therapy & Goals - 11/01/16 1455      Nutrition Therapy   Diet   Carb Modified, Therapeutic Lifestyle Changes      Personal Nutrition Goals   Nutrition Goal  Pt to identify food quantities necessary to achieve weight loss of 6-24 lb (2.7-10.9 kg) at graduation from cardiac rehab. Goal wt of 175 lb desired.       Intervention Plan   Intervention  Prescribe, educate and counsel regarding individualized specific dietary modifications aiming towards targeted core components such as weight, hypertension, lipid management, diabetes, heart failure and other comorbidities.    Expected Outcomes  Short Term Goal: Understand basic principles of dietary content, such as calories, fat, sodium, cholesterol and nutrients.;Long Term Goal: Adherence to prescribed nutrition plan.       Nutrition Discharge: Nutrition Assessments - 11/01/16 1455      MEDFICTS Scores   Pre Score  9       Education Questionnaire Score: Knowledge Questionnaire Score - 10/26/16 1428      Knowledge Questionnaire Score   Pre Score  21/24       Goals reviewed with patient; copy given to patient.

## 2017-03-13 NOTE — Addendum Note (Signed)
Encounter addended by: Lowell Guitar, RN on: 03/13/2017 12:27 PM  Actions taken: Sign clinical note, Episode resolved

## 2017-03-18 DIAGNOSIS — M79642 Pain in left hand: Secondary | ICD-10-CM | POA: Diagnosis not present

## 2017-03-19 ENCOUNTER — Encounter (HOSPITAL_COMMUNITY): Payer: Medicare Other

## 2017-03-21 ENCOUNTER — Encounter (HOSPITAL_COMMUNITY): Payer: Medicare Other

## 2017-03-22 DIAGNOSIS — M79662 Pain in left lower leg: Secondary | ICD-10-CM | POA: Diagnosis not present

## 2017-03-22 DIAGNOSIS — G8929 Other chronic pain: Secondary | ICD-10-CM | POA: Diagnosis not present

## 2017-03-22 DIAGNOSIS — M79604 Pain in right leg: Secondary | ICD-10-CM | POA: Diagnosis not present

## 2017-03-22 DIAGNOSIS — M79661 Pain in right lower leg: Secondary | ICD-10-CM | POA: Diagnosis not present

## 2017-03-22 DIAGNOSIS — M545 Low back pain: Secondary | ICD-10-CM | POA: Diagnosis not present

## 2017-03-22 DIAGNOSIS — M79605 Pain in left leg: Secondary | ICD-10-CM | POA: Diagnosis not present

## 2017-03-23 ENCOUNTER — Encounter (HOSPITAL_COMMUNITY): Payer: Medicare Other

## 2017-03-28 ENCOUNTER — Encounter (HOSPITAL_COMMUNITY): Payer: Medicare Other

## 2017-03-30 ENCOUNTER — Encounter (HOSPITAL_COMMUNITY): Payer: Medicare Other

## 2017-04-02 ENCOUNTER — Encounter (HOSPITAL_COMMUNITY): Payer: Medicare Other

## 2017-04-04 ENCOUNTER — Encounter (HOSPITAL_COMMUNITY): Payer: Medicare Other

## 2017-04-06 ENCOUNTER — Telehealth (HOSPITAL_COMMUNITY): Payer: Self-pay

## 2017-04-06 ENCOUNTER — Encounter (HOSPITAL_COMMUNITY): Payer: Medicare Other

## 2017-04-09 ENCOUNTER — Encounter (HOSPITAL_COMMUNITY): Payer: Medicare Other

## 2017-04-10 ENCOUNTER — Encounter (HOSPITAL_COMMUNITY): Payer: Self-pay

## 2017-04-10 ENCOUNTER — Encounter (HOSPITAL_COMMUNITY)
Admission: RE | Admit: 2017-04-10 | Discharge: 2017-04-10 | Disposition: A | Discharge status: Home / Self Care | Payer: Medicare Other | Source: Ambulatory Visit | Attending: Cardiovascular Disease | Admitting: Cardiovascular Disease

## 2017-04-10 VITALS — BP 94/62 | HR 65 | Ht 64.5 in | Wt 205.5 lb

## 2017-04-10 DIAGNOSIS — Z48812 Encounter for surgical aftercare following surgery on the circulatory system: Secondary | ICD-10-CM | POA: Diagnosis not present

## 2017-04-10 DIAGNOSIS — Z955 Presence of coronary angioplasty implant and graft: Secondary | ICD-10-CM | POA: Insufficient documentation

## 2017-04-10 NOTE — Progress Notes (Signed)
Cardiac Rehab Medication Review  Does the patient  feel that his/her medications are working for him/her?  YES   Has the patient been experiencing any side effects to the medications prescribed?   NO  Does the patient measure his/her own blood pressure or blood glucose at home?  {YES has meter for blood glucose.  Pt has bp cuff at home but doesn't check it on a regularly basis   Does the patient have any problems obtaining medications due to transportation or finances?    NO   Understanding of regimen: excellent Understanding of indications: excellent Potential of compliance: excellent    Pharmacist comments:  Pt in this morning for cardiac rehab orientation.  Pt is well known to cardiac rehab staff.  Pt brought in list of her medications. Reviewed list with pt.  No questions or concerns.     Anniebell Bedore Ruben Im RN 04/10/2017 9:43 AM

## 2017-04-10 NOTE — Progress Notes (Signed)
Elizabeth Park 47 y.o. female DOB: Nov 24, 1970 MRN: [id number]      Nutrition Note  1. Status post coronary artery stent placement    Past Medical History:  Diagnosis Date  . Chronic diastolic CHF (congestive heart failure) ([place])   . Coronary artery disease    a. 4v CABG  (LIMA to LAD, SVG to subbranch of ramus intermediate, SVG to lateral subbranch of ramus intermediate, SVG to distal RCA) and mitral valve repair January 2011. b. Abnormal nuc 09/2016 s/p  drug eluting stent to the vein graft to the PDA, a drug eluting stent in the native Ramus intermediate branch and a drug eluting stent in the protected left main. c. DES to ramus intermediate 02/2017.  . Diabetes mellitus   . H/O emotional problems   . Headache, migraine   . High cholesterol   . History of blood transfusion   . Hypertension   . MI, old   . MRSA (methicillin resistant Staphylococcus aureus)   . MVP (mitral valve prolapse)   . Neuromuscular disorder ([place])   . Neuropathy   . S/P mitral valve repair   . Sebaceous cyst    Meds reviewed. Novolog SSI, Janumet XR, Tyler Aas, Semaglutide noted  HT: Ht Readings from Last 1 Encounters:  04/10/17 5' 4.5" (1.638 m)    WT: Wt Readings from Last 3 Encounters:  04/10/17 205 lb 7.5 oz (93.2 kg)  02/15/17 204 lb 6.4 oz (92.7 kg)  02/08/17 206 lb 5.6 oz (93.6 kg)     BMI 34.7   Current tobacco use? No   Labs:  CBG (last 3)  No results for input(s): GLUCAP in the last 72 hours.  Nutrition Note Spoke with pt. Pt well-known to me from previous admission. Nutrition plan and goals reviewed with pt. Pt wants to lose wt. Pt has not been actively trying to lose wt. Wt loss tips reviewed. Pt is diabetic. No recent A1c noted. Pt reports her last A1c was 6.4. indicates blood glucose well-controlled. Pt checks CBG's 4 times a day. Fasting CBG's reportedly < 150 mg/dL so pt has not had to take her SSI recently. Pt's brother lives with pt now and has been a positive influence in helping pt  with her eating habits. Pt expressed understanding of the information reviewed. Pt aware of nutrition education classes offered.  Nutrition Diagnosis ? Food-and nutrition-related knowledge deficit related to lack of exposure to information as related to diagnosis of: ? CVD ? DM ? Obesity related to excessive energy intake as evidenced by a BMI of 34.7  Nutrition Intervention ? Pt's individual nutrition plan and goals reviewed with pt.  Nutrition Goal(s):  ? Pt to identify food quantities necessary to achieve weight loss of 6-24 lb (2.7-10.9 kg) at graduation from cardiac rehab. Goal wt of 180 lb desired.   Plan:  Pt to attend nutrition classes ? Nutrition I ? Nutrition II ? Portion Distortion ? Diabetes Blitz ? Diabetes Q & A Will provide client-centered nutrition education as part of interdisciplinary care.   Monitor and evaluate progress toward nutrition goal with team.  Derek Mound, M.Ed, RD, LDN, CDE 04/10/2017 10:21 AM

## 2017-04-10 NOTE — Progress Notes (Signed)
Cardiac Individual Treatment Plan  Patient Details  Name: Elizabeth Park MRN: [id number] Date of Birth: 05/08/1970 Referring Provider:     CARDIAC REHAB PHASE II ORIENTATION from 04/10/2017 in [facility]  Referring Provider  Delorise Royals MD      Initial Encounter Date:    CARDIAC REHAB PHASE II ORIENTATION from 04/10/2017 in [place]  Date  04/10/17  Referring Provider  Delorise Royals MD      Visit Diagnosis: Status post coronary artery stent placement DES Intermediate Ramus 02/07/17  Patient's Home Medications on Admission:  Current Outpatient Medications:  .  aspirin 81 MG tablet, Take 81 mg by mouth daily. , Disp: , Rfl:  .  carvedilol (COREG) 6.25 MG tablet, Take 1 tablet (6.25 mg total) 2 (two) times daily with a meal by mouth., Disp: 30 tablet, Rfl: 6 .  clopidogrel (PLAVIX) 75 MG tablet, Take 1 tablet (75 mg total) by mouth daily with breakfast., Disp: 90 tablet, Rfl: 4 .  fluticasone (FLONASE) 50 MCG/ACT nasal spray, Place 1 spray into both nostrils 2 (two) times daily. , Disp: , Rfl:  .  furosemide (LASIX) 20 MG tablet, TAKE ONE TABLET BY MOUTH DAILY AS NEEDED FOR SWELLING, Disp: 30 tablet, Rfl: 9 .  insulin aspart (NOVOLOG FLEXPEN) 100 UNIT/ML injection, Inject 0-12 Units into the skin See admin instructions. Per sliding scale Under 150 no units 150-199 take 8 units 200-249 take 10 units 250-299 take 12 units, Disp: , Rfl:  .  lisinopril (PRINIVIL,ZESTRIL) 5 MG tablet, Take 1 tablet (5 mg total) daily by mouth., Disp: 30 tablet, Rfl: 6 .  LYRICA CR 165 MG TB24, Take 165 mg daily by mouth. , Disp: , Rfl:  .  montelukast (SINGULAIR) 10 MG tablet, Take 10 mg by mouth at bedtime., Disp: , Rfl:  .  Naloxone HCl (EVZIO) 0.4 MG/0.4ML SOAJ, Inject 0.4 mg as directed as needed (for possible overdose). , Disp: , Rfl:  .  nitroGLYCERIN (NITROSTAT) 0.4 MG SL tablet, Place 1 tablet (0.4 mg total) under the tongue  every 5 (five) minutes as needed for chest pain., Disp: 25 tablet, Rfl: 6 .  oxyCODONE-acetaminophen (PERCOCET) 10-325 MG per tablet, Take 1 tablet by mouth 3 (three) times daily., Disp: , Rfl:  .  rosuvastatin (CRESTOR) 40 MG tablet, Take 1 tablet (40 mg total) every evening by mouth., Disp: 30 tablet, Rfl: 6 .  Semaglutide 1 MG/DOSE SOPN, Inject 1 mg once a week into the skin., Disp: , Rfl:  .  SitaGLIPtin-MetFORMIN HCl (JANUMET XR) 50-1000 MG TB24, Take 1 tablet by mouth 2 (two) times daily., Disp: , Rfl:  .  TRESIBA FLEXTOUCH 100 UNIT/ML SOPN FlexTouch Pen, Inject 40 Units as directed at bedtime. , Disp: , Rfl: 4  Past Medical History: Past Medical History:  Diagnosis Date  . Chronic diastolic CHF (congestive heart failure) ([place])   . Coronary artery disease    a. 4v CABG  (LIMA to LAD, SVG to subbranch of ramus intermediate, SVG to lateral subbranch of ramus intermediate, SVG to distal RCA) and mitral valve repair January 2011. b. Abnormal nuc 09/2016 s/p  drug eluting stent to the vein graft to the PDA, a drug eluting stent in the native Ramus intermediate branch and a drug eluting stent in the protected left main. c. DES to ramus intermediate 02/2017.  . Diabetes mellitus   . H/O emotional problems   . Headache, migraine   . High cholesterol   .  History of blood transfusion   . Hypertension   . MI, old   . MRSA (methicillin resistant Staphylococcus aureus)   . MVP (mitral valve prolapse)   . Neuromuscular disorder ([place])   . Neuropathy   . S/P mitral valve repair   . Sebaceous cyst     Tobacco Use: Social History   Tobacco Use  Smoking Status Former Smoker  . Packs/day: 1.00  . Years: 28.00  . Pack years: 28.00  . Types: Cigarettes  . Last attempt to quit: 03/31/2009  . Years since quitting: 8.0  Smokeless Tobacco Never Used    Labs: Recent Review Scientist, physiological    Labs for ITP Cardiac and Pulmonary Rehab Latest Ref Rng & Units 04/10/2009 04/10/2009 04/26/2009 05/28/2009  09/02/2009   Cholestrol 0 - 200 mg/dL - - - - -   LDLCALC 0 - 99 mg/dL - - - - -   HDL >39 mg/dL - - - - -   Trlycerides <150 mg/dL - - - - -   Hemoglobin A1c % - - 8.0(H) - 6.5   PHART 7.350 - 7.400 - - - 7.454(H) -   PCO2ART 35.0 - 45.0 mmHg - - - 33.9(L) -   HCO3 20.0 - 24.0 mEq/L - - - 23.4 -   TCO2 0 - 100 mmol/L 42 39 - 24.5 -   ACIDBASEDEF 0.0 - 2.0 mmol/L - - - 0.0 -   O2SAT % - - - 97.9 -      Capillary Blood Glucose: Lab Results  Component Value Date   GLUCAP 144 (H) 02/08/2017   GLUCAP 135 (H) 02/08/2017   GLUCAP 95 02/07/2017   GLUCAP 109 (H) 02/05/2017   GLUCAP 141 (H) 01/10/2017     Exercise Target Goals: Date: 04/10/17  Exercise Program Goal: Individual exercise prescription set with THRR, safety & activity barriers. Participant demonstrates ability to understand and report RPE using BORG scale, to self-measure pulse accurately, and to acknowledge the importance of the exercise prescription.  Exercise Prescription Goal: Starting with aerobic activity 30 plus minutes a day, 3 days per week for initial exercise prescription. Provide home exercise prescription and guidelines that participant acknowledges understanding prior to discharge.  Activity Barriers & Risk Stratification: Activity Barriers & Cardiac Risk Stratification - 04/10/17 0931      Activity Barriers & Cardiac Risk Stratification   Activity Barriers  Deconditioning;Muscular Weakness    Cardiac Risk Stratification  High       6 Minute Walk: 6 Minute Walk    Row Name 10/26/16 1501 04/10/17 1002 04/10/17 1043     6 Minute Walk   Phase  Initial  Initial  -   Distance  1600 feet  -  1466 feet   Walk Time  6 minutes  6 minutes  -   # of Rest Breaks  0  0  -   MPH  3  -  2.78   METS  3.9  -  3.78   RPE  11  12  12    VO2 Peak  13.7  -  13.2   Symptoms  No  No  No   Resting HR  64 bpm  65 bpm  -   Resting BP  110/72  94/62  -   Resting Oxygen Saturation   -  99 %  -   Exercise Oxygen  Saturation  during 6 min walk  -  100 %  -   Max Ex. HR  83 bpm  109 bpm  -   Max Ex. BP  117/69  98/70  -   2 Minute Post BP  117/77  102/70  -      Oxygen Initial Assessment:   Oxygen Re-Evaluation:   Oxygen Discharge (Final Oxygen Re-Evaluation):   Initial Exercise Prescription: Initial Exercise Prescription - 04/10/17 1000      Date of Initial Exercise RX and Referring Provider   Date  04/10/17    Referring Provider  Delorise Royals MD      Treadmill   MPH  2.6    Grade  0    Minutes  10    METs  2.99      Bike   Level  0.8    Minutes  10    METs  2.59      NuStep   Level  3    SPM  80    Minutes  10    METs  2      Prescription Details   Frequency (times per week)  3    Duration  Progress to 30 minutes of continuous aerobic without signs/symptoms of physical distress      Intensity   THRR 40-80% of Max Heartrate  69-138    Ratings of Perceived Exertion  11-15    Perceived Dyspnea  0-4      Progression   Progression  Continue to progress workloads to maintain intensity without signs/symptoms of physical distress.      Resistance Training   Training Prescription  Yes    Weight  3lbs    Reps  10-15       Perform Capillary Blood Glucose checks as needed.  Exercise Prescription Changes:  Exercise Prescription Changes    Row Name 11/01/16 1700 12/11/16 1700 01/09/17 1600 01/31/17 1500 02/05/17 1550     Response to Exercise   Blood Pressure (Admit)  102/68  106/60  108/62  94/60  96/50   Blood Pressure (Exercise)  132/84  114/70  120/70  124/70  92/60   Blood Pressure (Exit)  104/60  102/62  89/62 recheck 91/70, pt asymptomatic, encourage hydration  94/64  pt asymptomatic, encourage hydration before/after exercise  94/68   Heart Rate (Admit)  65 bpm  72 bpm  83 bpm  74 bpm  75 bpm   Heart Rate (Exercise)  81 bpm  83 bpm  99 bpm  94 bpm  95 bpm   Heart Rate (Exit)  65 bpm  68 bpm  65 bpm  70 bpm  74 bpm   Rating of Perceived Exertion  (Exercise)  11  11  11  11  11    Symptoms  none  none  none  none  none   Comments  pt was oriented to exercise equipment  pt was oriented to exercise equipment  -  -  pt BP was low at start, with exercise and post. Pt asymptomatic and encouraged hydration   Duration  Continue with 30 min of aerobic exercise without signs/symptoms of physical distress.  Continue with 30 min of aerobic exercise without signs/symptoms of physical distress.  Continue with 30 min of aerobic exercise without signs/symptoms of physical distress.  Continue with 30 min of aerobic exercise without signs/symptoms of physical distress.  Continue with 30 min of aerobic exercise without signs/symptoms of physical distress.   Intensity  THRR unchanged  THRR unchanged  THRR unchanged  THRR unchanged  THRR unchanged     Progression   Progression  Continue  to progress workloads to maintain intensity without signs/symptoms of physical distress.  Continue to progress workloads to maintain intensity without signs/symptoms of physical distress.  Continue to progress workloads to maintain intensity without signs/symptoms of physical distress.  Continue to progress workloads to maintain intensity without signs/symptoms of physical distress.  Continue to progress workloads to maintain intensity without signs/symptoms of physical distress.   Average METs  2.6  2.5  3.3  3.3  2.8     Resistance Training   Training Prescription  Yes  Yes  Yes  No relaxation day  Yes   Weight  2lbs  2lbs  3lbs  -  3lbs   Reps  10-15  10-15  10-15  -  10-15   Time  10 Minutes  10 Minutes  10 Minutes  -  10 Minutes     Interval Training   Interval Training  -  -  -  No  No     Treadmill   MPH  -  -  2.6  2.6  2.6   Grade  -  -  1  1  1    Minutes  -  -  15  15  15    METs  -  -  3.35  3.35  3.35     Bike   Level  1  1  1.2  1.2  -   Minutes  15  15  15  15   -   METs  2.95  2.95  3.38  3.38  -     NuStep   Level  3  4  -  -  5   SPM  70  70  -  -  80    Minutes  15  15  -  -  15   METs  2.3  2.1  -  -  2.3     Home Exercise Plan   Plans to continue exercise at  -  Home (comment) walking on treadmill  Home (comment) walking on treadmill  Home (comment) walking on treadmill  Home (comment) walking on treadmill   Frequency  -  Add 2 additional days to program exercise sessions.  Add 2 additional days to program exercise sessions.  Add 2 additional days to program exercise sessions.  Add 2 additional days to program exercise sessions.   Initial Home Exercises Provided  -  11/22/16  11/22/16  11/22/16  11/22/16      Exercise Comments:  Exercise Comments    Row Name 11/15/16 1702 12/12/16 1439 01/09/17 1642 01/31/17 1612 02/19/17 1552   Exercise Comments  Pt was oriented to exercise equipment on 11/01/16. Pt tolerated exercise fairly well; will continue to monitor pt's progress and activity levels.  Reviewed and METs goals. Pt is tolerating exercise well; will continue to monitor pt's progress.  Reviewed and METs goals. Pt is tolerating exercise well; will continue to monitor pt's progress.  Reviewed and METs goals. Pt is tolerating exercise well; will continue to monitor pt's progress.  Pt completed 23 sessions of cardiac rehab. Pt plans to restart program to continue a heart healthy lifestlyle.      Exercise Goals and Review:  Exercise Goals    Row Name 10/26/16 1416 12/12/16 1439 04/10/17 1004         Exercise Goals   Increase Physical Activity  Yes  (Pended)   Yes  Yes     Intervention  Provide advice, education, support and counseling about physical activity/exercise needs.;Develop  an individualized exercise prescription for aerobic and resistive training based on initial evaluation findings, risk stratification, comorbidities and participant's personal goals.  (Pended)   Provide advice, education, support and counseling about physical activity/exercise needs.;Develop an individualized exercise prescription for aerobic and resistive  training based on initial evaluation findings, risk stratification, comorbidities and participant's personal goals.  Provide advice, education, support and counseling about physical activity/exercise needs.     Expected Outcomes  Achievement of increased cardiorespiratory fitness and enhanced flexibility, muscular endurance and strength shown through measurements of functional capacity and personal statement of participant.  (Pended)   Achievement of increased cardiorespiratory fitness and enhanced flexibility, muscular endurance and strength shown through measurements of functional capacity and personal statement of participant.  -     Increase Strength and Stamina  Yes  (Pended)   Yes  Yes     Intervention  Provide advice, education, support and counseling about physical activity/exercise needs.;Develop an individualized exercise prescription for aerobic and resistive training based on initial evaluation findings, risk stratification, comorbidities and participant's personal goals.  (Pended)   Provide advice, education, support and counseling about physical activity/exercise needs.;Develop an individualized exercise prescription for aerobic and resistive training based on initial evaluation findings, risk stratification, comorbidities and participant's personal goals.  Provide advice, education, support and counseling about physical activity/exercise needs.;Develop an individualized exercise prescription for aerobic and resistive training based on initial evaluation findings, risk stratification, comorbidities and participant's personal goals.     Expected Outcomes  Achievement of increased cardiorespiratory fitness and enhanced flexibility, muscular endurance and strength shown through measurements of functional capacity and personal statement of participant.  (Pended)   Achievement of increased cardiorespiratory fitness and enhanced flexibility, muscular endurance and strength shown through measurements of  functional capacity and personal statement of participant.  Achievement of increased cardiorespiratory fitness and enhanced flexibility, muscular endurance and strength shown through measurements of functional capacity and personal statement of participant.     Able to understand and use rate of perceived exertion (RPE) scale  -  Yes  Yes     Intervention  -  Provide education and explanation on how to use RPE scale  Provide education and explanation on how to use RPE scale     Expected Outcomes  -  Short Term: Able to use RPE daily in rehab to express subjective intensity level;Long Term:  Able to use RPE to guide intensity level when exercising independently  Short Term: Able to use RPE daily in rehab to express subjective intensity level;Long Term:  Able to use RPE to guide intensity level when exercising independently     Knowledge and understanding of Target Heart Rate Range (THRR)  -  Yes  Yes     Intervention  -  Provide education and explanation of THRR including how the numbers were predicted and where they are located for reference  Provide education and explanation of THRR including how the numbers were predicted and where they are located for reference     Expected Outcomes  -  Short Term: Able to state/look up THRR;Long Term: Able to use THRR to govern intensity when exercising independently;Short Term: Able to use daily as guideline for intensity in rehab  Short Term: Able to state/look up THRR;Long Term: Able to use THRR to govern intensity when exercising independently;Short Term: Able to use daily as guideline for intensity in rehab     Able to check pulse independently  -  Yes  Yes     Intervention  -  Provide education and demonstration on how to check pulse in carotid and radial arteries.;Review the importance of being able to check your own pulse for safety during independent exercise  Provide education and demonstration on how to check pulse in carotid and radial arteries.;Review the  importance of being able to check your own pulse for safety during independent exercise     Expected Outcomes  -  Short Term: Able to explain why pulse checking is important during independent exercise;Long Term: Able to check pulse independently and accurately  Short Term: Able to explain why pulse checking is important during independent exercise;Long Term: Able to check pulse independently and accurately     Understanding of Exercise Prescription  -  Yes  Yes     Intervention  -  Provide education, explanation, and written materials on patient's individual exercise prescription  Provide education, explanation, and written materials on patient's individual exercise prescription     Expected Outcomes  -  Short Term: Able to explain program exercise prescription;Long Term: Able to explain home exercise prescription to exercise independently  Short Term: Able to explain program exercise prescription;Long Term: Able to explain home exercise prescription to exercise independently        Exercise Goals Re-Evaluation : Exercise Goals Re-Evaluation    Row Name 11/22/16 1502 12/12/16 1439 01/09/17 1642 01/31/17 1558 01/31/17 1607     Exercise Goal Re-Evaluation   Exercise Goals Review  Increase Physical Activity;Increase Strenth and Stamina  Increase Physical Activity;Able to understand and use rate of perceived exertion (RPE) scale;Understanding of Exercise Prescription;Knowledge and understanding of Target Heart Rate Range (THRR);Increase Strength and Stamina;Able to check pulse independently  Increase Physical Activity;Able to understand and use rate of perceived exertion (RPE) scale;Understanding of Exercise Prescription;Knowledge and understanding of Target Heart Rate Range (THRR);Increase Strength and Stamina;Able to check pulse independently  -  -   Comments  Reviewed home exercise with pt today.  Pt plans to walk on treadmill for exercise.  Reviewed THR, pulse, RPE, sign and symptoms, NTG use, and  when to call 911 or MD.  Also discussed weather considerations and indoor options.  Pt voiced understanding.  Reviewed home exercise with pt today.  Pt plans to walk on treadmill for exercise.  Reviewed THR, pulse, RPE, sign and symptoms, NTG use, and when to call 911 or MD.  Also discussed weather considerations and indoor options.  Pt voiced understanding.  Pt is walking 1-2 miles on Tues./Thurs without difficulty or signs and symptoms of CP, dizziness or lightheadedness.  Pt is maintaining activity level in cardiac rehab and [facility] though having chest discomfort. Pt has schedled an appoint  Pt is maintaining activity level in cardiac rehab and [facility] though having chest discomfort. Pt is scheduled for an appointment with cardiology next week. Will f/u.   Expected Outcomes  Pt will be compliant with HEP and improve in cardiorespiratory fitness  Pt will be compliant with HEP and improve in cardiorespiratory fitness  Pt will be compliant with HEP and improve in cardiorespiratory fitness  -  Pt will be able to exercise without signs and symtoms of physical distress       Discharge Exercise Prescription (Final Exercise Prescription Changes): Exercise Prescription Changes - 02/05/17 1550      Response to Exercise   Blood Pressure (Admit)  96/50    Blood Pressure (Exercise)  92/60    Blood Pressure (Exit)  94/68    Heart Rate (Admit)  75 bpm    Heart Rate (Exercise)  95 bpm    Heart Rate (Exit)  74 bpm    Rating of Perceived Exertion (Exercise)  11    Symptoms  none    Comments  pt BP was low at start, with exercise and post. Pt asymptomatic and encouraged hydration    Duration  Continue with 30 min of aerobic exercise without signs/symptoms of physical distress.    Intensity  THRR unchanged      Progression   Progression  Continue to progress workloads to maintain intensity without signs/symptoms of physical distress.    Average METs  2.8      Resistance Training   Training Prescription  Yes     Weight  3lbs    Reps  10-15    Time  10 Minutes      Interval Training   Interval Training  No      Treadmill   MPH  2.6    Grade  1    Minutes  15    METs  3.35      NuStep   Level  5    SPM  80    Minutes  15    METs  2.3      Home Exercise Plan   Plans to continue exercise at  Home (comment) walking on treadmill    Frequency  Add 2 additional days to program exercise sessions.    Initial Home Exercises Provided  11/22/16       Nutrition:  Target Goals: Understanding of nutrition guidelines, daily intake of sodium <1569m, cholesterol <2043m calories 30% from fat and 7% or less from saturated fats, daily to have 5 or more servings of fruits and vegetables.  Biometrics: Pre Biometrics - 04/10/17 1145      Pre Biometrics   Height  5' 4.5" (1.638 m)    Weight  205 lb 7.5 oz (93.2 kg)    Waist Circumference  45 inches    Hip Circumference  44.5 inches    Waist to Hip Ratio  1.01 %    BMI (Calculated)  34.74    Triceps Skinfold  21 mm    % Body Fat  43.2 %    Grip Strength  36 kg    Flexibility  10.5 in    Single Leg Stand  18 seconds        Nutrition Therapy Plan and Nutrition Goals: Nutrition Therapy & Goals - 11/01/16 1455      Nutrition Therapy   Diet  Carb Modified, Therapeutic Lifestyle Changes      Personal Nutrition Goals   Nutrition Goal  Pt to identify food quantities necessary to achieve weight loss of 6-24 lb (2.7-10.9 kg) at graduation from cardiac rehab. Goal wt of 175 lb desired.       Intervention Plan   Intervention  Prescribe, educate and counsel regarding individualized specific dietary modifications aiming towards targeted core components such as weight, hypertension, lipid management, diabetes, heart failure and other comorbidities.    Expected Outcomes  Short Term Goal: Understand basic principles of dietary content, such as calories, fat, sodium, cholesterol and nutrients.;Long Term Goal: Adherence to prescribed nutrition plan.        Nutrition Discharge: Nutrition Scores: Nutrition Assessments - 11/01/16 1455      MEDFICTS Scores   Pre Score  9       Nutrition Goals Re-Evaluation: Nutrition Goals Re-Evaluation    Row Name 02/28/17 [id number]  Goals   Current Weight  207 lb 7.4 oz (94.1 kg)       Nutrition Goal  Pt to identify food quantities necessary to achieve weight loss of 6-24 lb (2.7-10.9 kg) at graduation from cardiac rehab. Goal wt of 175 lb desired.        Comment  Pt has lost 6.6 lb since admission. Wt loss goal met.          Nutrition Goals Re-Evaluation: Nutrition Goals Re-Evaluation    [place] Name 02/28/17 0959             Goals   Current Weight  207 lb 7.4 oz (94.1 kg)       Nutrition Goal  Pt to identify food quantities necessary to achieve weight loss of 6-24 lb (2.7-10.9 kg) at graduation from cardiac rehab. Goal wt of 175 lb desired.        Comment  Pt has lost 6.6 lb since admission. Wt loss goal met.          Nutrition Goals Discharge (Final Nutrition Goals Re-Evaluation): Nutrition Goals Re-Evaluation - 02/28/17 0959      Goals   Current Weight  207 lb 7.4 oz (94.1 kg)    Nutrition Goal  Pt to identify food quantities necessary to achieve weight loss of 6-24 lb (2.7-10.9 kg) at graduation from cardiac rehab. Goal wt of 175 lb desired.     Comment  Pt has lost 6.6 lb since admission. Wt loss goal met.       Psychosocial: Target Goals: Acknowledge presence or absence of significant depression and/or stress, maximize coping skills, provide positive support system. Participant is able to verbalize types and ability to use techniques and skills needed for reducing stress and depression.  Initial Review & Psychosocial Screening: Initial Psych Review & Screening - 04/10/17 1136      Initial Review   Current issues with  None Identified      Family Dynamics   Good Support System?  Yes Elizabeth Park has her boyfriend, son and parents for support       Barriers   Psychosocial  barriers to participate in program  There are no identifiable barriers or psychosocial needs.      Screening Interventions   Interventions  Encouraged to exercise;Provide feedback about the scores to participant Reviewed qualithy of life scores with Elizabeth Park        Quality of Life Scores: Quality of Life - 04/10/17 1148      Quality of Life Scores   Health/Function Pre  24 %    Socioeconomic Pre  27 %    Psych/Spiritual Pre  18.4 %    Family Pre  27.6 %    GLOBAL Pre  24.09 %       PHQ-9: Recent Review Flowsheet Data    Depression screen [facility] 2/9 04/10/2017 11/01/2016 08/09/2016 09/13/2015   Decreased Interest 0 0 0 0   Down, Depressed, Hopeless 0  0 0 0   PHQ - 2 Score 0 0 0 0     Interpretation of Total Score  Total Score Depression Severity:  1-4 = Minimal depression, 5-9 = Mild depression, 10-14 = Moderate depression, 15-19 = Moderately severe depression, 20-27 = Severe depression   Psychosocial Evaluation and Intervention: Psychosocial Evaluation - 02/14/17 1635      Psychosocial Evaluation & Interventions   Interventions  Encouraged to exercise with the program and follow exercise prescription;Stress management education;Relaxation education    Comments  no psychosocial needs identified,  no interventions necessary     Expected Outcomes  pt will exhibit positive outlook with good coping skills     Continue Psychosocial Services   No Follow up required      Discharge Psychosocial Assessment & Intervention   Comments  no psychosocial needs identified, no interventions necessary.        Psychosocial Re-Evaluation: Psychosocial Re-Evaluation    Row Name 11/16/16 1133 12/13/16 1818 01/09/17 0717 02/02/17 1157       Psychosocial Re-Evaluation   Current issues with  None Identified  None Identified  None Identified  None Identified    Comments  no psychosocial needs identified, no interventions necessary.   no psychosocial needs identified, no interventions necessary.   no  psychosocial needs identified, no interventions necessary.   no psychosocial needs identified, no interventions necessary.     Expected Outcomes  pt will exhibit positive outlook with good coping skills.   pt will exhibit positive outlook with good coping skills.   pt will exhibit positive outlook with good coping skills.   pt will exhibit positive outlook with good coping skills.     Interventions  Stress management education;Relaxation education;Encouraged to attend Cardiac Rehabilitation for the exercise  Stress management education;Relaxation education;Encouraged to attend Cardiac Rehabilitation for the exercise  Stress management education;Relaxation education;Encouraged to attend Cardiac Rehabilitation for the exercise  Stress management education;Relaxation education;Encouraged to attend Cardiac Rehabilitation for the exercise    Continue Psychosocial Services   No Follow up required  No Follow up required  No Follow up required  No Follow up required       Psychosocial Discharge (Final Psychosocial Re-Evaluation): Psychosocial Re-Evaluation - 02/02/17 1157      Psychosocial Re-Evaluation   Current issues with  None Identified    Comments  no psychosocial needs identified, no interventions necessary.     Expected Outcomes  pt will exhibit positive outlook with good coping skills.     Interventions  Stress management education;Relaxation education;Encouraged to attend Cardiac Rehabilitation for the exercise    Continue Psychosocial Services   No Follow up required       Vocational Rehabilitation: Provide vocational rehab assistance to qualifying candidates.   Vocational Rehab Evaluation & Intervention: Vocational Rehab - 04/10/17 0934      Initial Vocational Rehab Evaluation & Intervention   Assessment shows need for Vocational Rehabilitation  No pt denies interest in Voc Rehab at this time.       Education: Education Goals: Education classes will be provided on a weekly basis,  covering required topics. Participant will state understanding/return demonstration of topics presented.  Learning Barriers/Preferences: Learning Barriers/Preferences - 04/10/17 0931      Learning Barriers/Preferences   Learning Barriers  Sight    Learning Preferences  Skilled Demonstration       Education Topics: Count Your Pulse:  -Group instruction provided by verbal instruction, demonstration, patient participation and written materials to support subject.  Instructors address importance of being able to find your pulse and how to count your pulse when at home without a heart monitor.  Patients get hands on experience counting their pulse with staff help and individually.   Heart Attack, Angina, and Risk Factor Modification:  -Group instruction provided by verbal instruction, video, and written materials to support subject.  Instructors address signs and symptoms of angina and heart attacks.    Also discuss risk factors for heart disease and how to make changes to improve heart health risk factors.   Functional Fitness:  -  Group instruction provided by verbal instruction, demonstration, patient participation, and written materials to support subject.  Instructors address safety measures for doing things around the house.  Discuss how to get up and down off the floor, how to pick things up properly, how to safely get out of a chair without assistance, and balance training.   Meditation and Mindfulness:  -Group instruction provided by verbal instruction, patient participation, and written materials to support subject.  Instructor addresses importance of mindfulness and meditation practice to help reduce stress and improve awareness.  Instructor also leads participants through a meditation exercise.    Stretching for Flexibility and Mobility:  -Group instruction provided by verbal instruction, patient participation, and written materials to support subject.  Instructors lead participants  through series of stretches that are designed to increase flexibility thus improving mobility.  These stretches are additional exercise for major muscle groups that are typically performed during regular warm up and cool down.   Hands Only CPR:  -Group verbal, video, and participation provides a basic overview of AHA guidelines for community CPR. Role-play of emergencies allow participants the opportunity to practice calling for help and chest compression technique with discussion of AED use.   Hypertension: -Group verbal and written instruction that provides a basic overview of hypertension including the most recent diagnostic guidelines, risk factor reduction with self-care instructions and medication management.    Nutrition I class: Heart Healthy Eating:  -Group instruction provided by PowerPoint slides, verbal discussion, and written materials to support subject matter. The instructor gives an explanation and review of the Therapeutic Lifestyle Changes diet recommendations, which includes a discussion on lipid goals, dietary fat, sodium, fiber, plant stanol/sterol esters, sugar, and the components of a well-balanced, healthy diet.   Nutrition II class: Lifestyle Skills:  -Group instruction provided by PowerPoint slides, verbal discussion, and written materials to support subject matter. The instructor gives an explanation and review of label reading, grocery shopping for heart health, heart healthy recipe modifications, and ways to make healthier choices when eating out.   Diabetes Question & Answer:  -Group instruction provided by PowerPoint slides, verbal discussion, and written materials to support subject matter. The instructor gives an explanation and review of diabetes co-morbidities, pre- and post-prandial blood glucose goals, pre-exercise blood glucose goals, signs, symptoms, and treatment of hypoglycemia and hyperglycemia, and foot care basics.   Diabetes Blitz:  -Group  instruction provided by PowerPoint slides, verbal discussion, and written materials to support subject matter. The instructor gives an explanation and review of the physiology behind type 1 and type 2 diabetes, diabetes medications and rational behind using different medications, pre- and post-prandial blood glucose recommendations and Hemoglobin A1c goals, diabetes diet, and exercise including blood glucose guidelines for exercising safely.    Portion Distortion:  -Group instruction provided by PowerPoint slides, verbal discussion, written materials, and food models to support subject matter. The instructor gives an explanation of serving size versus portion size, changes in portions sizes over the last 20 years, and what consists of a serving from each food group.   Stress Management:  -Group instruction provided by verbal instruction, video, and written materials to support subject matter.  Instructors review role of stress in heart disease and how to cope with stress positively.     Exercising on Your Own:  -Group instruction provided by verbal instruction, power point, and written materials to support subject.  Instructors discuss benefits of exercise, components of exercise, frequency and intensity of exercise, and end points for exercise.  Also discuss use of nitroglycerin and activating EMS.  Review options of places to exercise outside of rehab.  Review guidelines for sex with heart disease.   CARDIAC REHAB PHASE II EXERCISE from 12/06/2016 in [place]  Date  12/06/16  Educator  EP  Instruction Review Code  2- meets goals/outcomes      Cardiac Drugs I:  -Group instruction provided by verbal instruction and written materials to support subject.  Instructor reviews cardiac drug classes: antiplatelets, anticoagulants, beta blockers, and statins.  Instructor discusses reasons, side effects, and lifestyle considerations for each drug class.   Cardiac Drugs  II:  -Group instruction provided by verbal instruction and written materials to support subject.  Instructor reviews cardiac drug classes: angiotensin converting enzyme inhibitors (ACE-I), angiotensin II receptor blockers (ARBs), nitrates, and calcium channel blockers.  Instructor discusses reasons, side effects, and lifestyle considerations for each drug class.   Anatomy and Physiology of the Circulatory System:  Group verbal and written instruction and models provide basic cardiac anatomy and physiology, with the coronary electrical and arterial systems. Review of: AMI, Angina, Valve disease, Heart Failure, Peripheral Artery Disease, Cardiac Arrhythmia, Pacemakers, and the ICD.   CARDIAC REHAB PHASE II EXERCISE from 12/06/2016 in [place]  Date  11/01/16  Instruction Review Code  2- meets goals/outcomes      Other Education:  -Group or individual verbal, written, or video instructions that support the educational goals of the cardiac rehab program.   Knowledge Questionnaire Score: Knowledge Questionnaire Score - 04/10/17 0934      Knowledge Questionnaire Score   Pre Score  23/24       Core Components/Risk Factors/Patient Goals at Admission: Personal Goals and Risk Factors at Admission - 04/10/17 1048      Core Components/Risk Factors/Patient Goals on Admission   Goal Weight: Short Term  200 lb (90.7 kg)    Goal Weight: Long Term  190 lb (86.2 kg)       Core Components/Risk Factors/Patient Goals Review:  Goals and Risk Factor Review    Row Name 11/16/16 1131 12/13/16 1818 01/09/17 0716 02/02/17 1156 02/14/17 1633     Core Components/Risk Factors/Patient Goals Review   Personal Goals Review  Diabetes;Hypertension;Lipids  Diabetes;Hypertension;Lipids  Diabetes;Hypertension;Lipids  Diabetes;Hypertension;Lipids  Diabetes;Hypertension;Lipids   Review  pt with multiple CAD RF displays eagerness to participate in CR exercise, nutrition and lifestyle  education.  pt with multiple CAD RF displays eagerness to participate in CR exercise, nutrition and lifestyle education.  pt with multiple CAD RF displays eagerness to participate in CR exercise, nutrition and lifestyle education. pt blood sugar normalizing with current treatment.  pt BP WNL  pt with multiple CAD RF displays eagerness to participate in CR exercise, nutrition and lifestyle education. pt blood sugar normalizing with current treatment.  pt BP WNL. pt notes increased stamina with household activities.  pt working towards improving CAD RF. pt blood sugar normalizing and maintaining BP WNL.     Expected Outcomes  pt will participate in CR exercise,nutrition, and lifestyle education opportunities.    pt will participate in CR exercise,nutrition, and lifestyle education opportunities.    pt will participate in CR exercise,nutrition, and lifestyle education opportunities.    pt will participate in CR exercise,nutrition, and lifestyle education opportunities.    pt will be independent with exercise,nutrition, and lifestyle modification practices.    [place] Name 02/28/17 [phone]             Core  Components/Risk Factors/Patient Goals Review   Personal Goals Review  Weight Management/Obesity       Review  Pt has lost 6.6 lb. Minimum wt loss goal met.        Expected Outcomes  Continue to encourage slow wt loss of 1-2 lb/week to wt loss goal wt of 175 lb.           Core Components/Risk Factors/Patient Goals at Discharge (Final Review):  Goals and Risk Factor Review - 02/28/17 0959      Core Components/Risk Factors/Patient Goals Review   Personal Goals Review  Weight Management/Obesity    Review  Pt has lost 6.6 lb. Minimum wt loss goal met.     Expected Outcomes  Continue to encourage slow wt loss of 1-2 lb/week to wt loss goal wt of 175 lb.        ITP Comments: ITP Comments    Row Name 10/26/16 1412 11/16/16 1130 12/13/16 1818 01/09/17 0716 02/02/17 1156   ITP Comments  Dr. Fransico Him,  Medical Director   Dr. Fransico Him, Medical Director   Dr. Fransico Him, Medical Director   30 day ITP review.  pt with good participation and attendance.  30 day ITP review.  pt with good participation and attendance.   [place] Name 02/14/17 1632 04/10/17 0930         ITP Comments  pt exited from CR program early due to hospitalization for restent instenosis. 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   Medical Director, Dr. Fransico Him         Comments: Elizabeth Park attended orientation from 0831 to [phone] to review rules and guidelines for program. Completed 6 minute walk test, Intitial ITP, and exercise prescription. Initial blood was noted at 94/64. Heart rate 64. Elizabeth Park was asymptomatic during the walk test. Exercise blood pressure was noted at 98/70. Exit blood pressure 102/70 with a heart rate of 58.  Telemetry-Sinus Rhtyhm.  Asymptomatic. Elizabeth Park is going through cardiac rehab again for her recent stent placement in November of 2018. Will notify Dr Angelena Form about today's blood pressure as Elizabeth Park has a history of low blood pressure. Adan  Did not have any complaints upon exit from cardiac rehab orientation.Barnet Pall, RN,BSN 04/10/2017 12:06 PM

## 2017-04-11 ENCOUNTER — Other Ambulatory Visit: Payer: Self-pay | Admitting: Physician Assistant

## 2017-04-11 ENCOUNTER — Encounter (HOSPITAL_COMMUNITY): Payer: Medicare Other

## 2017-04-11 MED ORDER — CARVEDILOL 6.25 MG PO TABS: 6.2500 mg | ORAL_TABLET | Freq: Two times a day (BID) | ORAL | 3 refills | Status: DC

## 2017-04-12 ENCOUNTER — Other Ambulatory Visit: Payer: Medicare Other | Admitting: *Deleted

## 2017-04-12 DIAGNOSIS — I5032 Chronic diastolic (congestive) heart failure: Secondary | ICD-10-CM | POA: Diagnosis not present

## 2017-04-12 DIAGNOSIS — I25810 Atherosclerosis of coronary artery bypass graft(s) without angina pectoris: Secondary | ICD-10-CM | POA: Diagnosis not present

## 2017-04-12 DIAGNOSIS — E785 Hyperlipidemia, unspecified: Secondary | ICD-10-CM

## 2017-04-12 DIAGNOSIS — I10 Essential (primary) hypertension: Secondary | ICD-10-CM

## 2017-04-12 LAB — HEPATIC FUNCTION PANEL
ALT: 16 IU/L (ref 0–32)
AST: 15 IU/L (ref 0–40)
Albumin: 4.4 g/dL (ref 3.5–5.5)
Alkaline Phosphatase: 37 IU/L — ABNORMAL LOW (ref 39–117)
Bilirubin Total: 0.2 mg/dL (ref 0.0–1.2)
Bilirubin, Direct: 0.11 mg/dL (ref 0.00–0.40)
Total Protein: 6.8 g/dL (ref 6.0–8.5)

## 2017-04-12 LAB — LIPID PANEL
Chol/HDL Ratio: 3.9 ratio (ref 0.0–4.4)
Cholesterol, Total: 126 mg/dL (ref 100–199)
HDL: 32 mg/dL — ABNORMAL LOW (ref >39)
LDL Calculated: 61 mg/dL (ref 0–99)
Triglycerides: 164 mg/dL — ABNORMAL HIGH (ref 0–149)
VLDL Cholesterol Cal: 33 mg/dL (ref 5–40)

## 2017-04-13 ENCOUNTER — Encounter (HOSPITAL_COMMUNITY): Payer: Medicare Other

## 2017-04-16 ENCOUNTER — Encounter (HOSPITAL_COMMUNITY): Payer: Medicare Other

## 2017-04-18 ENCOUNTER — Encounter (HOSPITAL_COMMUNITY)
Admission: RE | Admit: 2017-04-18 | Discharge: 2017-04-18 | Disposition: A | Discharge status: Home / Self Care | Payer: Medicare Other | Source: Ambulatory Visit

## 2017-04-18 ENCOUNTER — Encounter (HOSPITAL_COMMUNITY): Payer: Medicare Other

## 2017-04-18 DIAGNOSIS — Z955 Presence of coronary angioplasty implant and graft: Secondary | ICD-10-CM

## 2017-04-20 ENCOUNTER — Encounter (HOSPITAL_COMMUNITY): Payer: Medicare Other

## 2017-04-23 ENCOUNTER — Encounter (HOSPITAL_COMMUNITY): Payer: Medicare Other

## 2017-04-24 DIAGNOSIS — M79605 Pain in left leg: Secondary | ICD-10-CM | POA: Diagnosis not present

## 2017-04-24 DIAGNOSIS — G8929 Other chronic pain: Secondary | ICD-10-CM | POA: Diagnosis not present

## 2017-04-24 DIAGNOSIS — M79661 Pain in right lower leg: Secondary | ICD-10-CM | POA: Diagnosis not present

## 2017-04-24 DIAGNOSIS — M79662 Pain in left lower leg: Secondary | ICD-10-CM | POA: Diagnosis not present

## 2017-04-24 DIAGNOSIS — M545 Low back pain: Secondary | ICD-10-CM | POA: Diagnosis not present

## 2017-04-24 DIAGNOSIS — M79604 Pain in right leg: Secondary | ICD-10-CM | POA: Diagnosis not present

## 2017-04-25 ENCOUNTER — Encounter (HOSPITAL_COMMUNITY)
Admission: RE | Admit: 2017-04-25 | Discharge: 2017-04-25 | Disposition: A | Discharge status: Home / Self Care | Payer: Medicare Other | Source: Ambulatory Visit | Attending: Cardiovascular Disease | Admitting: Cardiovascular Disease

## 2017-04-25 ENCOUNTER — Encounter (HOSPITAL_COMMUNITY): Payer: Medicare Other

## 2017-04-25 DIAGNOSIS — Z955 Presence of coronary angioplasty implant and graft: Secondary | ICD-10-CM

## 2017-04-25 DIAGNOSIS — E089 Diabetes mellitus due to underlying condition without complications: Secondary | ICD-10-CM | POA: Diagnosis not present

## 2017-04-25 DIAGNOSIS — Z48812 Encounter for surgical aftercare following surgery on the circulatory system: Secondary | ICD-10-CM | POA: Diagnosis not present

## 2017-04-25 DIAGNOSIS — I110 Hypertensive heart disease with heart failure: Secondary | ICD-10-CM | POA: Diagnosis not present

## 2017-04-25 LAB — GLUCOSE, CAPILLARY
Glucose-Capillary: 155 mg/dL — ABNORMAL HIGH (ref 65–99)
Glucose-Capillary: 139 mg/dL — ABNORMAL HIGH (ref 65–99)

## 2017-04-25 NOTE — Progress Notes (Signed)
Cardiac Individual Treatment Plan  Patient Details  Name: Elizabeth Park MRN: [id number] Date of Birth: 09-12-1970 Referring Provider:     CARDIAC REHAB PHASE II ORIENTATION from 04/10/2017 in [facility]  Referring Provider  Delorise Royals MD      Initial Encounter Date:    CARDIAC REHAB PHASE II ORIENTATION from 04/10/2017 in [facility]  Date  04/10/17  Referring Provider  Delorise Royals MD      Visit Diagnosis: Status post coronary artery stent placement DES Intermediate Ramus 02/07/17  Patient's Home Medications on Admission:  Current Outpatient Medications:  .  aspirin 81 MG tablet, Take 81 mg by mouth daily. , Disp: , Rfl:  .  carvedilol (COREG) 6.25 MG tablet, Take 1 tablet (6.25 mg total) by mouth 2 (two) times daily with a meal., Disp: 180 tablet, Rfl: 3 .  clopidogrel (PLAVIX) 75 MG tablet, Take 1 tablet (75 mg total) by mouth daily with breakfast., Disp: 90 tablet, Rfl: 4 .  fluticasone (FLONASE) 50 MCG/ACT nasal spray, Place 1 spray into both nostrils 2 (two) times daily. , Disp: , Rfl:  .  furosemide (LASIX) 20 MG tablet, TAKE ONE TABLET BY MOUTH DAILY AS NEEDED FOR SWELLING, Disp: 30 tablet, Rfl: 9 .  insulin aspart (NOVOLOG FLEXPEN) 100 UNIT/ML injection, Inject 0-12 Units into the skin See admin instructions. Per sliding scale Under 150 no units 150-199 take 8 units 200-249 take 10 units 250-299 take 12 units, Disp: , Rfl:  .  lisinopril (PRINIVIL,ZESTRIL) 5 MG tablet, Take 1 tablet (5 mg total) daily by mouth., Disp: 30 tablet, Rfl: 6 .  LYRICA CR 165 MG TB24, Take 165 mg daily by mouth. , Disp: , Rfl:  .  montelukast (SINGULAIR) 10 MG tablet, Take 10 mg by mouth at bedtime., Disp: , Rfl:  .  Naloxone HCl (EVZIO) 0.4 MG/0.4ML SOAJ, Inject 0.4 mg as directed as needed (for possible overdose). , Disp: , Rfl:  .  nitroGLYCERIN (NITROSTAT) 0.4 MG SL tablet, Place 1 tablet (0.4 mg total) under the tongue  every 5 (five) minutes as needed for chest pain., Disp: 25 tablet, Rfl: 6 .  oxyCODONE-acetaminophen (PERCOCET) 10-325 MG per tablet, Take 1 tablet by mouth 3 (three) times daily., Disp: , Rfl:  .  rosuvastatin (CRESTOR) 40 MG tablet, Take 1 tablet (40 mg total) every evening by mouth., Disp: 30 tablet, Rfl: 6 .  Semaglutide 1 MG/DOSE SOPN, Inject 1 mg once a week into the skin., Disp: , Rfl:  .  SitaGLIPtin-MetFORMIN HCl (JANUMET XR) 50-1000 MG TB24, Take 1 tablet by mouth 2 (two) times daily., Disp: , Rfl:  .  TRESIBA FLEXTOUCH 100 UNIT/ML SOPN FlexTouch Pen, Inject 40 Units as directed at bedtime. , Disp: , Rfl: 4  Past Medical History: Past Medical History:  Diagnosis Date  . Chronic diastolic CHF (congestive heart failure) ([place])   . Coronary artery disease    a. 4v CABG  (LIMA to LAD, SVG to subbranch of ramus intermediate, SVG to lateral subbranch of ramus intermediate, SVG to distal RCA) and mitral valve repair January 2011. b. Abnormal nuc 09/2016 s/p  drug eluting stent to the vein graft to the PDA, a drug eluting stent in the native Ramus intermediate branch and a drug eluting stent in the protected left main. c. DES to ramus intermediate 02/2017.  . Diabetes mellitus   . H/O emotional problems   . Headache, migraine   . High cholesterol   .  History of blood transfusion   . Hypertension   . MI, old   . MRSA (methicillin resistant Staphylococcus aureus)   . MVP (mitral valve prolapse)   . Neuromuscular disorder ([place])   . Neuropathy   . S/P mitral valve repair   . Sebaceous cyst     Tobacco Use: Social History   Tobacco Use  Smoking Status Former Smoker  . Packs/day: 1.00  . Years: 28.00  . Pack years: 28.00  . Types: Cigarettes  . Last attempt to quit: 03/31/2009  . Years since quitting: 8.0  Smokeless Tobacco Never Used    Labs: Recent Review Flowsheet Data    Labs for ITP Cardiac and Pulmonary Rehab Latest Ref Rng & Units 04/10/2009 04/26/2009 05/28/2009 09/02/2009  04/12/2017   Cholestrol 100 - 199 mg/dL - - - - 126   LDLCALC 0 - 99 mg/dL - - - - 61   HDL >39 mg/dL - - - - 32(L)   Trlycerides 0 - 149 mg/dL - - - - 164(H)   Hemoglobin A1c % - 8.0(H) - 6.5 -   PHART 7.350 - 7.400 - - 7.454(H) - -   PCO2ART 35.0 - 45.0 mmHg - - 33.9(L) - -   HCO3 20.0 - 24.0 mEq/L - - 23.4 - -   TCO2 0 - 100 mmol/L 39 - 24.5 - -   ACIDBASEDEF 0.0 - 2.0 mmol/L - - 0.0 - -   O2SAT % - - 97.9 - -      Capillary Blood Glucose: Lab Results  Component Value Date   GLUCAP 139 (H) 04/25/2017   GLUCAP 155 (H) 04/25/2017   GLUCAP 144 (H) 02/08/2017   GLUCAP 135 (H) 02/08/2017   GLUCAP 95 02/07/2017     Exercise Target Goals:    Exercise Program Goal: Individual exercise prescription set using results from initial 6 min walk test and THRR while considering  patient's activity barriers and safety.   Exercise Prescription Goal: Initial exercise prescription builds to 30-45 minutes a day of aerobic activity, 2-3 days per week.  Home exercise guidelines will be given to patient during program as part of exercise prescription that the participant will acknowledge.  Activity Barriers & Risk Stratification: Activity Barriers & Cardiac Risk Stratification - 04/10/17 0931      Activity Barriers & Cardiac Risk Stratification   Activity Barriers  Deconditioning;Muscular Weakness    Cardiac Risk Stratification  High       6 Minute Walk: 6 Minute Walk    Row Name 04/10/17 1002 04/10/17 1043       6 Minute Walk   Phase  Initial  -    Distance  -  1466 feet    Walk Time  6 minutes  -    # of Rest Breaks  0  -    MPH  -  2.78    METS  -  3.78    RPE  12  12    VO2 Peak  -  13.2    Symptoms  No  No    Resting HR  65 bpm  -    Resting BP  94/62  -    Resting Oxygen Saturation   99 %  -    Exercise Oxygen Saturation  during 6 min walk  100 %  -    Max Ex. HR  109 bpm  -    Max Ex. BP  98/70  -    2 Minute Post BP  102/70  -       Oxygen Initial  Assessment:   Oxygen Re-Evaluation:   Oxygen Discharge (Final Oxygen Re-Evaluation):   Initial Exercise Prescription: Initial Exercise Prescription - 04/10/17 1000      Date of Initial Exercise RX and Referring Provider   Date  04/10/17    Referring Provider  Delorise Royals MD      Treadmill   MPH  2.6    Grade  0    Minutes  10    METs  2.99      Bike   Level  0.8    Minutes  10    METs  2.59      NuStep   Level  3    SPM  80    Minutes  10    METs  2      Prescription Details   Frequency (times per week)  3    Duration  Progress to 30 minutes of continuous aerobic without signs/symptoms of physical distress      Intensity   THRR 40-80% of Max Heartrate  69-138    Ratings of Perceived Exertion  11-15    Perceived Dyspnea  0-4      Progression   Progression  Continue to progress workloads to maintain intensity without signs/symptoms of physical distress.      Resistance Training   Training Prescription  Yes    Weight  3lbs    Reps  10-15       Perform Capillary Blood Glucose checks as needed.  Exercise Prescription Changes:  Exercise Prescription Changes    Row Name 11/01/16 1700 12/11/16 1700 01/09/17 1600 01/31/17 1500 02/05/17 1550     Response to Exercise   Blood Pressure (Admit)  102/68  106/60  108/62  94/60  96/50   Blood Pressure (Exercise)  132/84  114/70  120/70  124/70  92/60   Blood Pressure (Exit)  104/60  102/62  89/62 recheck 91/70, pt asymptomatic, encourage hydration  94/64  pt asymptomatic, encourage hydration before/after exercise  94/68   Heart Rate (Admit)  65 bpm  72 bpm  83 bpm  74 bpm  75 bpm   Heart Rate (Exercise)  81 bpm  83 bpm  99 bpm  94 bpm  95 bpm   Heart Rate (Exit)  65 bpm  68 bpm  65 bpm  70 bpm  74 bpm   Rating of Perceived Exertion (Exercise)  11  11  11  11  11    Symptoms  none  none  none  none  none   Comments  pt was oriented to exercise equipment  pt was oriented to exercise equipment  -  -  pt BP was  low at start, with exercise and post. Pt asymptomatic and encouraged hydration   Duration  Continue with 30 min of aerobic exercise without signs/symptoms of physical distress.  Continue with 30 min of aerobic exercise without signs/symptoms of physical distress.  Continue with 30 min of aerobic exercise without signs/symptoms of physical distress.  Continue with 30 min of aerobic exercise without signs/symptoms of physical distress.  Continue with 30 min of aerobic exercise without signs/symptoms of physical distress.   Intensity  THRR unchanged  THRR unchanged  THRR unchanged  THRR unchanged  THRR unchanged     Progression   Progression  Continue to progress workloads to maintain intensity without signs/symptoms of physical distress.  Continue to progress workloads to maintain intensity without signs/symptoms of  physical distress.  Continue to progress workloads to maintain intensity without signs/symptoms of physical distress.  Continue to progress workloads to maintain intensity without signs/symptoms of physical distress.  Continue to progress workloads to maintain intensity without signs/symptoms of physical distress.   Average METs  2.6  2.5  3.3  3.3  2.8     Resistance Training   Training Prescription  Yes  Yes  Yes  No relaxation day  Yes   Weight  2lbs  2lbs  3lbs  -  3lbs   Reps  10-15  10-15  10-15  -  10-15   Time  10 Minutes  10 Minutes  10 Minutes  -  10 Minutes     Interval Training   Interval Training  -  -  -  No  No     Treadmill   MPH  -  -  2.6  2.6  2.6   Grade  -  -  1  1  1    Minutes  -  -  15  15  15    METs  -  -  3.35  3.35  3.35     Bike   Level  1  1  1.2  1.2  -   Minutes  15  15  15  15   -   METs  2.95  2.95  3.38  3.38  -     NuStep   Level  3  4  -  -  5   SPM  70  70  -  -  80   Minutes  15  15  -  -  15   METs  2.3  2.1  -  -  2.3     Home Exercise Plan   Plans to continue exercise at  -  Home (comment) walking on treadmill  Home (comment) walking  on treadmill  Home (comment) walking on treadmill  Home (comment) walking on treadmill   Frequency  -  Add 2 additional days to program exercise sessions.  Add 2 additional days to program exercise sessions.  Add 2 additional days to program exercise sessions.  Add 2 additional days to program exercise sessions.   Initial Home Exercises Provided  -  11/22/16  11/22/16  11/22/16  11/22/16   Row Name 04/25/17 1100             Response to Exercise   Blood Pressure (Admit)  120/80       Blood Pressure (Exercise)  136/82       Blood Pressure (Exit)  98/70       Heart Rate (Admit)  69 bpm       Heart Rate (Exercise)  94 bpm       Heart Rate (Exit)  61 bpm       Rating of Perceived Exertion (Exercise)  12       Symptoms  none       Duration  Continue with 30 min of aerobic exercise without signs/symptoms of physical distress.       Intensity  THRR unchanged         Progression   Progression  Continue to progress workloads to maintain intensity without signs/symptoms of physical distress.       Average METs  2.8         Resistance Training   Training Prescription  No Relaxation today         Interval Training   Interval  Training  No         Treadmill   MPH  2.6       Grade  0       Minutes  10       METs  2.99         Bike   Level  0.8       Minutes  10       METs  2.62         NuStep   Level  3       SPM  85       Minutes  10       METs  2.8          Exercise Comments:  Exercise Comments    Row Name 11/15/16 1702 12/12/16 1439 01/09/17 1642 01/31/17 1612 02/19/17 1552   Exercise Comments  Pt was oriented to exercise equipment on 11/01/16. Pt tolerated exercise fairly well; will continue to monitor pt's progress and activity levels.  Reviewed and METs goals. Pt is tolerating exercise well; will continue to monitor pt's progress.  Reviewed and METs goals. Pt is tolerating exercise well; will continue to monitor pt's progress.  Reviewed and METs goals. Pt is tolerating  exercise well; will continue to monitor pt's progress.  Pt completed 23 sessions of cardiac rehab. Pt plans to restart program to continue a heart healthy lifestlyle.   [place] Name 04/25/17 1112           Exercise Comments  Patient restarted cardiac rehab today and tolerated low-moderate intensity without difficulty.           Exercise Goals and Review:  Exercise Goals    Row Name 12/12/16 1439 04/10/17 1004           Exercise Goals   Increase Physical Activity  Yes  Yes      Intervention  Provide advice, education, support and counseling about physical activity/exercise needs.;Develop an individualized exercise prescription for aerobic and resistive training based on initial evaluation findings, risk stratification, comorbidities and participant's personal goals.  Provide advice, education, support and counseling about physical activity/exercise needs.      Expected Outcomes  Achievement of increased cardiorespiratory fitness and enhanced flexibility, muscular endurance and strength shown through measurements of functional capacity and personal statement of participant.  -      Increase Strength and Stamina  Yes  Yes      Intervention  Provide advice, education, support and counseling about physical activity/exercise needs.;Develop an individualized exercise prescription for aerobic and resistive training based on initial evaluation findings, risk stratification, comorbidities and participant's personal goals.  Provide advice, education, support and counseling about physical activity/exercise needs.;Develop an individualized exercise prescription for aerobic and resistive training based on initial evaluation findings, risk stratification, comorbidities and participant's personal goals.      Expected Outcomes  Achievement of increased cardiorespiratory fitness and enhanced flexibility, muscular endurance and strength shown through measurements of functional capacity and personal statement of  participant.  Achievement of increased cardiorespiratory fitness and enhanced flexibility, muscular endurance and strength shown through measurements of functional capacity and personal statement of participant.      Able to understand and use rate of perceived exertion (RPE) scale  Yes  Yes      Intervention  Provide education and explanation on how to use RPE scale  Provide education and explanation on how to use RPE scale      Expected Outcomes  Short Term: Able to use  RPE daily in rehab to express subjective intensity level;Long Term:  Able to use RPE to guide intensity level when exercising independently  Short Term: Able to use RPE daily in rehab to express subjective intensity level;Long Term:  Able to use RPE to guide intensity level when exercising independently      Knowledge and understanding of Target Heart Rate Range (THRR)  Yes  Yes      Intervention  Provide education and explanation of THRR including how the numbers were predicted and where they are located for reference  Provide education and explanation of THRR including how the numbers were predicted and where they are located for reference      Expected Outcomes  Short Term: Able to state/look up THRR;Long Term: Able to use THRR to govern intensity when exercising independently;Short Term: Able to use daily as guideline for intensity in rehab  Short Term: Able to state/look up THRR;Long Term: Able to use THRR to govern intensity when exercising independently;Short Term: Able to use daily as guideline for intensity in rehab      Able to check pulse independently  Yes  Yes      Intervention  Provide education and demonstration on how to check pulse in carotid and radial arteries.;Review the importance of being able to check your own pulse for safety during independent exercise  Provide education and demonstration on how to check pulse in carotid and radial arteries.;Review the importance of being able to check your own pulse for safety  during independent exercise      Expected Outcomes  Short Term: Able to explain why pulse checking is important during independent exercise;Long Term: Able to check pulse independently and accurately  Short Term: Able to explain why pulse checking is important during independent exercise;Long Term: Able to check pulse independently and accurately      Understanding of Exercise Prescription  Yes  Yes      Intervention  Provide education, explanation, and written materials on patient's individual exercise prescription  Provide education, explanation, and written materials on patient's individual exercise prescription      Expected Outcomes  Short Term: Able to explain program exercise prescription;Long Term: Able to explain home exercise prescription to exercise independently  Short Term: Able to explain program exercise prescription;Long Term: Able to explain home exercise prescription to exercise independently         Exercise Goals Re-Evaluation : Exercise Goals Re-Evaluation    Row Name 11/22/16 1502 12/12/16 1439 01/09/17 1642 01/31/17 1558 01/31/17 1607     Exercise Goal Re-Evaluation   Exercise Goals Review  Increase Physical Activity;Increase Strenth and Stamina  Increase Physical Activity;Able to understand and use rate of perceived exertion (RPE) scale;Understanding of Exercise Prescription;Knowledge and understanding of Target Heart Rate Range (THRR);Increase Strength and Stamina;Able to check pulse independently  Increase Physical Activity;Able to understand and use rate of perceived exertion (RPE) scale;Understanding of Exercise Prescription;Knowledge and understanding of Target Heart Rate Range (THRR);Increase Strength and Stamina;Able to check pulse independently  -  -   Comments  Reviewed home exercise with pt today.  Pt plans to walk on treadmill for exercise.  Reviewed THR, pulse, RPE, sign and symptoms, NTG use, and when to call 911 or MD.  Also discussed weather considerations and  indoor options.  Pt voiced understanding.  Reviewed home exercise with pt today.  Pt plans to walk on treadmill for exercise.  Reviewed THR, pulse, RPE, sign and symptoms, NTG use, and when to call 911 or MD.  Also discussed weather considerations and indoor options.  Pt voiced understanding.  Pt is walking 1-2 miles on Tues./Thurs without difficulty or signs and symptoms of CP, dizziness or lightheadedness.  Pt is maintaining activity level in cardiac rehab and [facility] though having chest discomfort. Pt has schedled an appoint  Pt is maintaining activity level in cardiac rehab and [facility] though having chest discomfort. Pt is scheduled for an appointment with cardiology next week. Will f/u.   Expected Outcomes  Pt will be compliant with HEP and improve in cardiorespiratory fitness  Pt will be compliant with HEP and improve in cardiorespiratory fitness  Pt will be compliant with HEP and improve in cardiorespiratory fitness  -  Pt will be able to exercise without signs and symtoms of physical distress   Row Name 04/25/17 1112             Exercise Goal Re-Evaluation   Exercise Goals Review  Increase Physical Activity       Comments  Patient tolerated first day of exercise well. No complaints with exercise       Expected Outcomes  Increase workloads as tolerated.           Discharge Exercise Prescription (Final Exercise Prescription Changes): Exercise Prescription Changes - 04/25/17 1100      Response to Exercise   Blood Pressure (Admit)  120/80    Blood Pressure (Exercise)  136/82    Blood Pressure (Exit)  98/70    Heart Rate (Admit)  69 bpm    Heart Rate (Exercise)  94 bpm    Heart Rate (Exit)  61 bpm    Rating of Perceived Exertion (Exercise)  12    Symptoms  none    Duration  Continue with 30 min of aerobic exercise without signs/symptoms of physical distress.    Intensity  THRR unchanged      Progression   Progression  Continue to progress workloads to maintain intensity without  signs/symptoms of physical distress.    Average METs  2.8      Resistance Training   Training Prescription  No Relaxation today      Interval Training   Interval Training  No      Treadmill   MPH  2.6    Grade  0    Minutes  10    METs  2.99      Bike   Level  0.8    Minutes  10    METs  2.62      NuStep   Level  3    SPM  85    Minutes  10    METs  2.8       Nutrition:  Target Goals: Understanding of nutrition guidelines, daily intake of sodium <1519m, cholesterol <2058m calories 30% from fat and 7% or less from saturated fats, daily to have 5 or more servings of fruits and vegetables.  Biometrics: Pre Biometrics - 04/10/17 1145      Pre Biometrics   Height  5' 4.5" (1.638 m)    Weight  205 lb 7.5 oz (93.2 kg)    Waist Circumference  45 inches    Hip Circumference  44.5 inches    Waist to Hip Ratio  1.01 %    BMI (Calculated)  34.74    Triceps Skinfold  21 mm    % Body Fat  43.2 %    Grip Strength  36 kg    Flexibility  10.5 in    Single  Leg Stand  18 seconds        Nutrition Therapy Plan and Nutrition Goals: Nutrition Therapy & Goals - 04/10/17 1324      Nutrition Therapy   Diet  Carb Modified, Heart Healthy      Personal Nutrition Goals   Nutrition Goal  Pt to identify food quantities necessary to achieve weight loss of 6-24 lb (2.7-10.9 kg) at graduation from cardiac rehab. Goal wt of 180 lb desired.       Intervention Plan   Intervention  Prescribe, educate and counsel regarding individualized specific dietary modifications aiming towards targeted core components such as weight, hypertension, lipid management, diabetes, heart failure and other comorbidities.    Expected Outcomes  Short Term Goal: Understand basic principles of dietary content, such as calories, fat, sodium, cholesterol and nutrients.;Long Term Goal: Adherence to prescribed nutrition plan.       Nutrition Assessments: Nutrition Assessments - 04/10/17 1325      MEDFICTS Scores    Pre Score  51       Nutrition Goals Re-Evaluation: Nutrition Goals Re-Evaluation    Row Name 02/28/17 0959             Goals   Current Weight  207 lb 7.4 oz (94.1 kg)       Nutrition Goal  Pt to identify food quantities necessary to achieve weight loss of 6-24 lb (2.7-10.9 kg) at graduation from cardiac rehab. Goal wt of 175 lb desired.        Comment  Pt has lost 6.6 lb since admission. Wt loss goal met.          Nutrition Goals Re-Evaluation: Nutrition Goals Re-Evaluation    [place] Name 02/28/17 0959             Goals   Current Weight  207 lb 7.4 oz (94.1 kg)       Nutrition Goal  Pt to identify food quantities necessary to achieve weight loss of 6-24 lb (2.7-10.9 kg) at graduation from cardiac rehab. Goal wt of 175 lb desired.        Comment  Pt has lost 6.6 lb since admission. Wt loss goal met.          Nutrition Goals Discharge (Final Nutrition Goals Re-Evaluation): Nutrition Goals Re-Evaluation - 02/28/17 0959      Goals   Current Weight  207 lb 7.4 oz (94.1 kg)    Nutrition Goal  Pt to identify food quantities necessary to achieve weight loss of 6-24 lb (2.7-10.9 kg) at graduation from cardiac rehab. Goal wt of 175 lb desired.     Comment  Pt has lost 6.6 lb since admission. Wt loss goal met.       Psychosocial: Target Goals: Acknowledge presence or absence of significant depression and/or stress, maximize coping skills, provide positive support system. Participant is able to verbalize types and ability to use techniques and skills needed for reducing stress and depression.  Initial Review & Psychosocial Screening: Initial Psych Review & Screening - 04/10/17 1136      Initial Review   Current issues with  None Identified      Family Dynamics   Good Support System?  Yes Elwyn has her boyfriend, son and parents for support       Barriers   Psychosocial barriers to participate in program  There are no identifiable barriers or psychosocial needs.       Screening Interventions   Interventions  Encouraged to exercise;Provide feedback about the scores  to participant Reviewed qualithy of life scores with [place]        Quality of Life Scores: Quality of Life - 04/10/17 1148      Quality of Life Scores   Health/Function Pre  24 %    Socioeconomic Pre  27 %    Psych/Spiritual Pre  18.4 %    Family Pre  27.6 %    GLOBAL Pre  24.09 %      Scores of 19 and below usually indicate a poorer quality of life in these areas.  A difference of  2-3 points is a clinically meaningful difference.  A difference of 2-3 points in the total score of the Quality of Life Index has been associated with significant improvement in overall quality of life, self-image, physical symptoms, and general health in studies assessing change in quality of life.  PHQ-9: Recent Review Flowsheet Data    Depression screen [facility] 2/9 04/25/2017 04/10/2017 11/01/2016 08/09/2016 09/13/2015   Decreased Interest 0 0 0 0 0   Down, Depressed, Hopeless 0 0  0 0 0   PHQ - 2 Score 0 0 0 0 0     Interpretation of Total Score  Total Score Depression Severity:  1-4 = Minimal depression, 5-9 = Mild depression, 10-14 = Moderate depression, 15-19 = Moderately severe depression, 20-27 = Severe depression   Psychosocial Evaluation and Intervention: Psychosocial Evaluation - 11/01/16 1639      Psychosocial Evaluation & Interventions   Interventions  Encouraged to exercise with the program and follow exercise prescription;Stress management education;Relaxation education    Comments  no psychosocial needs identified, no interventions necessary     Expected Outcomes  pt will exhibit positive outlook with good coping skills     Continue Psychosocial Services   No Follow up required       Psychosocial Re-Evaluation: Psychosocial Re-Evaluation    [place] Name 11/16/16 1133 12/13/16 1818 01/09/17 0717 02/02/17 1157       Psychosocial Re-Evaluation   Current issues with  None Identified  None Identified   None Identified  None Identified    Comments  no psychosocial needs identified, no interventions necessary.   no psychosocial needs identified, no interventions necessary.   no psychosocial needs identified, no interventions necessary.   no psychosocial needs identified, no interventions necessary.     Expected Outcomes  pt will exhibit positive outlook with good coping skills.   pt will exhibit positive outlook with good coping skills.   pt will exhibit positive outlook with good coping skills.   pt will exhibit positive outlook with good coping skills.     Interventions  Stress management education;Relaxation education;Encouraged to attend Cardiac Rehabilitation for the exercise  Stress management education;Relaxation education;Encouraged to attend Cardiac Rehabilitation for the exercise  Stress management education;Relaxation education;Encouraged to attend Cardiac Rehabilitation for the exercise  Stress management education;Relaxation education;Encouraged to attend Cardiac Rehabilitation for the exercise    Continue Psychosocial Services   No Follow up required  No Follow up required  No Follow up required  No Follow up required       Psychosocial Discharge (Final Psychosocial Re-Evaluation): Psychosocial Re-Evaluation - 02/02/17 1157      Psychosocial Re-Evaluation   Current issues with  None Identified    Comments  no psychosocial needs identified, no interventions necessary.     Expected Outcomes  pt will exhibit positive outlook with good coping skills.     Interventions  Stress management education;Relaxation education;Encouraged to attend Cardiac Rehabilitation for  the exercise    Continue Psychosocial Services   No Follow up required       Vocational Rehabilitation: Provide vocational rehab assistance to qualifying candidates.   Vocational Rehab Evaluation & Intervention: Vocational Rehab - 04/10/17 0934      Initial Vocational Rehab Evaluation & Intervention   Assessment shows  need for Vocational Rehabilitation  No pt denies interest in Voc Rehab at this time.       Education: Education Goals: Education classes will be provided on a weekly basis, covering required topics. Participant will state understanding/return demonstration of topics presented.  Learning Barriers/Preferences: Learning Barriers/Preferences - 04/10/17 0931      Learning Barriers/Preferences   Learning Barriers  Sight    Learning Preferences  Skilled Demonstration       Education Topics: Count Your Pulse:  -Group instruction provided by verbal instruction, demonstration, patient participation and written materials to support subject.  Instructors address importance of being able to find your pulse and how to count your pulse when at home without a heart monitor.  Patients get hands on experience counting their pulse with staff help and individually.   Heart Attack, Angina, and Risk Factor Modification:  -Group instruction provided by verbal instruction, video, and written materials to support subject.  Instructors address signs and symptoms of angina and heart attacks.    Also discuss risk factors for heart disease and how to make changes to improve heart health risk factors.   Functional Fitness:  -Group instruction provided by verbal instruction, demonstration, patient participation, and written materials to support subject.  Instructors address safety measures for doing things around the house.  Discuss how to get up and down off the floor, how to pick things up properly, how to safely get out of a chair without assistance, and balance training.   Meditation and Mindfulness:  -Group instruction provided by verbal instruction, patient participation, and written materials to support subject.  Instructor addresses importance of mindfulness and meditation practice to help reduce stress and improve awareness.  Instructor also leads participants through a meditation exercise.    Stretching  for Flexibility and Mobility:  -Group instruction provided by verbal instruction, patient participation, and written materials to support subject.  Instructors lead participants through series of stretches that are designed to increase flexibility thus improving mobility.  These stretches are additional exercise for major muscle groups that are typically performed during regular warm up and cool down.   Hands Only CPR:  -Group verbal, video, and participation provides a basic overview of AHA guidelines for community CPR. Role-play of emergencies allow participants the opportunity to practice calling for help and chest compression technique with discussion of AED use.   Hypertension: -Group verbal and written instruction that provides a basic overview of hypertension including the most recent diagnostic guidelines, risk factor reduction with self-care instructions and medication management.    Nutrition I class: Heart Healthy Eating:  -Group instruction provided by PowerPoint slides, verbal discussion, and written materials to support subject matter. The instructor gives an explanation and review of the Therapeutic Lifestyle Changes diet recommendations, which includes a discussion on lipid goals, dietary fat, sodium, fiber, plant stanol/sterol esters, sugar, and the components of a well-balanced, healthy diet.   Nutrition II class: Lifestyle Skills:  -Group instruction provided by PowerPoint slides, verbal discussion, and written materials to support subject matter. The instructor gives an explanation and review of label reading, grocery shopping for heart health, heart healthy recipe modifications, and ways to make healthier choices  when eating out.   Diabetes Question & Answer:  -Group instruction provided by PowerPoint slides, verbal discussion, and written materials to support subject matter. The instructor gives an explanation and review of diabetes co-morbidities, pre- and post-prandial  blood glucose goals, pre-exercise blood glucose goals, signs, symptoms, and treatment of hypoglycemia and hyperglycemia, and foot care basics.   Diabetes Blitz:  -Group instruction provided by PowerPoint slides, verbal discussion, and written materials to support subject matter. The instructor gives an explanation and review of the physiology behind type 1 and type 2 diabetes, diabetes medications and rational behind using different medications, pre- and post-prandial blood glucose recommendations and Hemoglobin A1c goals, diabetes diet, and exercise including blood glucose guidelines for exercising safely.    Portion Distortion:  -Group instruction provided by PowerPoint slides, verbal discussion, written materials, and food models to support subject matter. The instructor gives an explanation of serving size versus portion size, changes in portions sizes over the last 20 years, and what consists of a serving from each food group.   Stress Management:  -Group instruction provided by verbal instruction, video, and written materials to support subject matter.  Instructors review role of stress in heart disease and how to cope with stress positively.     Exercising on Your Own:  -Group instruction provided by verbal instruction, power point, and written materials to support subject.  Instructors discuss benefits of exercise, components of exercise, frequency and intensity of exercise, and end points for exercise.  Also discuss use of nitroglycerin and activating EMS.  Review options of places to exercise outside of rehab.  Review guidelines for sex with heart disease.   CARDIAC REHAB PHASE II EXERCISE from 12/06/2016 in [place]  Date  12/06/16  Educator  EP  Instruction Review Code  2- meets goals/outcomes      Cardiac Drugs I:  -Group instruction provided by verbal instruction and written materials to support subject.  Instructor reviews cardiac drug classes:  antiplatelets, anticoagulants, beta blockers, and statins.  Instructor discusses reasons, side effects, and lifestyle considerations for each drug class.   Cardiac Drugs II:  -Group instruction provided by verbal instruction and written materials to support subject.  Instructor reviews cardiac drug classes: angiotensin converting enzyme inhibitors (ACE-I), angiotensin II receptor blockers (ARBs), nitrates, and calcium channel blockers.  Instructor discusses reasons, side effects, and lifestyle considerations for each drug class.   Anatomy and Physiology of the Circulatory System:  Group verbal and written instruction and models provide basic cardiac anatomy and physiology, with the coronary electrical and arterial systems. Review of: AMI, Angina, Valve disease, Heart Failure, Peripheral Artery Disease, Cardiac Arrhythmia, Pacemakers, and the ICD.   CARDIAC REHAB PHASE II EXERCISE from 12/06/2016 in [place]  Date  11/01/16  Instruction Review Code  2- meets goals/outcomes      Other Education:  -Group or individual verbal, written, or video instructions that support the educational goals of the cardiac rehab program.   Knowledge Questionnaire Score: Knowledge Questionnaire Score - 04/10/17 0934      Knowledge Questionnaire Score   Pre Score  23/24       Core Components/Risk Factors/Patient Goals at Admission: Personal Goals and Risk Factors at Admission - 04/10/17 1048      Core Components/Risk Factors/Patient Goals on Admission   Goal Weight: Short Term  200 lb (90.7 kg)    Goal Weight: Long Term  190 lb (86.2 kg)  Core Components/Risk Factors/Patient Goals Review:  Goals and Risk Factor Review    Row Name 11/16/16 1131 12/13/16 1818 01/09/17 0716 02/02/17 1156 02/28/17 0959     Core Components/Risk Factors/Patient Goals Review   Personal Goals Review  Diabetes;Hypertension;Lipids  Diabetes;Hypertension;Lipids   Diabetes;Hypertension;Lipids  Diabetes;Hypertension;Lipids  Weight Management/Obesity   Review  pt with multiple CAD RF displays eagerness to participate in CR exercise, nutrition and lifestyle education.  pt with multiple CAD RF displays eagerness to participate in CR exercise, nutrition and lifestyle education.  pt with multiple CAD RF displays eagerness to participate in CR exercise, nutrition and lifestyle education. pt blood sugar normalizing with current treatment.  pt BP WNL  pt with multiple CAD RF displays eagerness to participate in CR exercise, nutrition and lifestyle education. pt blood sugar normalizing with current treatment.  pt BP WNL. pt notes increased stamina with household activities.  Pt has lost 6.6 lb. Minimum wt loss goal met.    Expected Outcomes  pt will participate in CR exercise,nutrition, and lifestyle education opportunities.    pt will participate in CR exercise,nutrition, and lifestyle education opportunities.    pt will participate in CR exercise,nutrition, and lifestyle education opportunities.    pt will participate in CR exercise,nutrition, and lifestyle education opportunities.    Continue to encourage slow wt loss of 1-2 lb/week to wt loss goal wt of 175 lb.       Core Components/Risk Factors/Patient Goals at Discharge (Final Review):  Goals and Risk Factor Review - 02/28/17 0959      Core Components/Risk Factors/Patient Goals Review   Personal Goals Review  Weight Management/Obesity    Review  Pt has lost 6.6 lb. Minimum wt loss goal met.     Expected Outcomes  Continue to encourage slow wt loss of 1-2 lb/week to wt loss goal wt of 175 lb.        ITP Comments: ITP Comments    Row Name 11/16/16 1130 12/13/16 1818 01/09/17 0716 02/02/17 1156 04/10/17 0930   ITP Comments  Dr. Fransico Him, Medical Director   Dr. Fransico Him, Medical Director   30 day ITP review.  pt with good participation and attendance.  30 day ITP review.  pt with good participation and  attendance.  Medical Director, Dr. Fransico Him      Comments: Pt started cardiac rehab today.  Pt tolerated light exercise without difficulty. VSS, telemetry-Sinus Rhtyhm, asymptomatic.  Medication list reconciled. Pt denies barriers to medicaiton compliance.  PSYCHOSOCIAL ASSESSMENT:  PHQ-0. Pt exhibits positive coping skills, hopeful outlook with supportive family. No psychosocial needs identified at this time, no psychosocial interventions necessary.    Pt enjoys working with crafts.   Pt oriented to exercise equipment and routine.    Understanding verbalized.Barnet Pall, RN,BSN 04/25/2017 4:23 PM

## 2017-04-27 ENCOUNTER — Encounter (HOSPITAL_COMMUNITY): Payer: Medicare Other

## 2017-04-27 ENCOUNTER — Encounter (HOSPITAL_COMMUNITY)
Admission: RE | Admit: 2017-04-27 | Discharge: 2017-04-27 | Disposition: A | Discharge status: Home / Self Care | Payer: Medicare Other | Source: Ambulatory Visit | Attending: Cardiovascular Disease | Admitting: Cardiovascular Disease

## 2017-04-27 DIAGNOSIS — Z48812 Encounter for surgical aftercare following surgery on the circulatory system: Secondary | ICD-10-CM | POA: Diagnosis not present

## 2017-04-27 DIAGNOSIS — Z955 Presence of coronary angioplasty implant and graft: Secondary | ICD-10-CM

## 2017-04-27 LAB — GLUCOSE, CAPILLARY
Glucose-Capillary: 195 mg/dL — ABNORMAL HIGH (ref 65–99)
Glucose-Capillary: 131 mg/dL — ABNORMAL HIGH (ref 65–99)

## 2017-04-30 ENCOUNTER — Encounter (HOSPITAL_COMMUNITY): Payer: Medicare Other

## 2017-05-02 ENCOUNTER — Encounter (HOSPITAL_COMMUNITY)
Admission: RE | Admit: 2017-05-02 | Discharge: 2017-05-02 | Disposition: A | Discharge status: Home / Self Care | Payer: Medicare Other | Source: Ambulatory Visit | Attending: Cardiovascular Disease | Admitting: Cardiovascular Disease

## 2017-05-02 ENCOUNTER — Encounter (HOSPITAL_COMMUNITY): Payer: Medicare Other

## 2017-05-02 DIAGNOSIS — Z955 Presence of coronary angioplasty implant and graft: Secondary | ICD-10-CM

## 2017-05-02 DIAGNOSIS — Z48812 Encounter for surgical aftercare following surgery on the circulatory system: Secondary | ICD-10-CM | POA: Diagnosis not present

## 2017-05-02 LAB — GLUCOSE, CAPILLARY: Glucose-Capillary: 170 mg/dL — ABNORMAL HIGH (ref 65–99)

## 2017-05-04 ENCOUNTER — Encounter (HOSPITAL_COMMUNITY)
Admission: RE | Admit: 2017-05-04 | Discharge: 2017-05-04 | Disposition: A | Discharge status: Home / Self Care | Payer: Medicare Other | Source: Ambulatory Visit | Attending: Cardiovascular Disease | Admitting: Cardiovascular Disease

## 2017-05-04 ENCOUNTER — Encounter (HOSPITAL_COMMUNITY): Payer: Medicare Other

## 2017-05-04 DIAGNOSIS — Z955 Presence of coronary angioplasty implant and graft: Secondary | ICD-10-CM | POA: Diagnosis not present

## 2017-05-04 DIAGNOSIS — Z48812 Encounter for surgical aftercare following surgery on the circulatory system: Secondary | ICD-10-CM | POA: Insufficient documentation

## 2017-05-04 NOTE — Progress Notes (Signed)
Reviewed home exercise guidelines with patient including endpoints, temperature precautions, target heart rate and rate of perceived exertion. Pt is walking 30-40 minutes daily as her mode of home exercise. Patient also plans to exercise at [employer].  Pt voices understanding of instructions given. Sol Passer, MS, ACSM CEP

## 2017-05-07 ENCOUNTER — Encounter (HOSPITAL_COMMUNITY)
Admission: RE | Admit: 2017-05-07 | Discharge: 2017-05-07 | Disposition: A | Discharge status: Home / Self Care | Payer: Medicare Other | Source: Ambulatory Visit | Attending: Cardiovascular Disease | Admitting: Cardiovascular Disease

## 2017-05-07 ENCOUNTER — Encounter (HOSPITAL_COMMUNITY): Payer: Medicare Other

## 2017-05-07 DIAGNOSIS — Z955 Presence of coronary angioplasty implant and graft: Secondary | ICD-10-CM

## 2017-05-07 DIAGNOSIS — Z48812 Encounter for surgical aftercare following surgery on the circulatory system: Secondary | ICD-10-CM | POA: Diagnosis not present

## 2017-05-07 NOTE — Progress Notes (Signed)
Elizabeth Park 47 y.o. female DOB: 1970/05/07 MRN: [id number]      Nutrition Note  Dx: DES SVG/ramus  Meds reviewed. Novolog SSI, Janumet XR, Tyler Aas, Semaglutide noted  Note Spoke with pt. Nutrition Plan and Nutrition Survey goals reviewed with pt. Pt is following a Heart Healthy diet with some room for improvement in dietary choices. Pt is diabetic. No updated A1c noted. This Probation officer went over Diabetes Education test results. Pt checks CBG's 4 times a day. Fasting CBG's reportedly "usually less than 150 mg/dL." Pt states she has not had to take her SSI very often. Pt expressed understanding of the information reviewed. Pt aware of nutrition education classes offered. Pt does not plan on attending nutrition classes offered "this time" and declined nutrition class handouts offered.   Nutrition Diagnosis ? Food-and nutrition-related knowledge deficit related to lack of exposure to information as related to diagnosis of: ? CVD ? DM ? Obesity related to excessive energy intake as evidenced by a BMI of 34.7  Nutrition Intervention ? Pt's individual nutrition plan reviewed with pt. ? Benefits of adopting Heart Healthy diet discussed when Medficts reviewed.  Nutrition Goal(s):  ? Pt to identify food quantities necessary to achieve weight loss of 6-24 lb (2.7-10.9 kg) at graduation from cardiac rehab. Goal wt of 180 lb desired.   Plan:  Pt to attend nutrition classes ? Portion Distortion ? Diabetes Q & A Will provide client-centered nutrition education as part of interdisciplinary care.   Monitor and evaluate progress toward nutrition goal with team.  Derek Mound, M.Ed, RD, LDN, CDE 05/07/2017 9:49 AM

## 2017-05-09 ENCOUNTER — Encounter (HOSPITAL_COMMUNITY): Payer: Medicare Other

## 2017-05-09 ENCOUNTER — Encounter (HOSPITAL_COMMUNITY)
Admission: RE | Admit: 2017-05-09 | Discharge: 2017-05-09 | Disposition: A | Discharge status: Home / Self Care | Payer: Medicare Other | Source: Ambulatory Visit | Attending: Cardiovascular Disease | Admitting: Cardiovascular Disease

## 2017-05-09 DIAGNOSIS — Z955 Presence of coronary angioplasty implant and graft: Secondary | ICD-10-CM | POA: Diagnosis not present

## 2017-05-09 DIAGNOSIS — Z48812 Encounter for surgical aftercare following surgery on the circulatory system: Secondary | ICD-10-CM | POA: Diagnosis not present

## 2017-05-11 ENCOUNTER — Encounter (HOSPITAL_COMMUNITY): Payer: Medicare Other

## 2017-05-14 ENCOUNTER — Encounter (HOSPITAL_COMMUNITY): Payer: Medicare Other

## 2017-05-16 ENCOUNTER — Encounter (INDEPENDENT_AMBULATORY_CARE_PROVIDER_SITE_OTHER): Payer: Self-pay

## 2017-05-16 ENCOUNTER — Encounter (HOSPITAL_COMMUNITY): Payer: Medicare Other

## 2017-05-16 ENCOUNTER — Ambulatory Visit (INDEPENDENT_AMBULATORY_CARE_PROVIDER_SITE_OTHER): Payer: Medicare Other | Admitting: Cardiovascular Disease

## 2017-05-16 ENCOUNTER — Encounter: Payer: Self-pay | Admitting: Cardiovascular Disease

## 2017-05-16 VITALS — BP 117/77 | HR 70 | Ht 64.5 in | Wt 208.6 lb

## 2017-05-16 DIAGNOSIS — Z9889 Other specified postprocedural states: Secondary | ICD-10-CM

## 2017-05-16 DIAGNOSIS — R5383 Other fatigue: Secondary | ICD-10-CM

## 2017-05-16 DIAGNOSIS — I25810 Atherosclerosis of coronary artery bypass graft(s) without angina pectoris: Secondary | ICD-10-CM | POA: Diagnosis not present

## 2017-05-16 DIAGNOSIS — F5101 Primary insomnia: Secondary | ICD-10-CM | POA: Diagnosis not present

## 2017-05-16 DIAGNOSIS — E7800 Pure hypercholesterolemia, unspecified: Secondary | ICD-10-CM | POA: Diagnosis not present

## 2017-05-16 DIAGNOSIS — I10 Essential (primary) hypertension: Secondary | ICD-10-CM | POA: Diagnosis not present

## 2017-05-16 DIAGNOSIS — I5032 Chronic diastolic (congestive) heart failure: Secondary | ICD-10-CM | POA: Diagnosis not present

## 2017-05-16 MED ORDER — ZOLPIDEM TARTRATE 5 MG PO TABS: 5.0000 mg | ORAL_TABLET | Freq: Every evening | ORAL | 3 refills | Status: DC | PRN

## 2017-05-16 MED ORDER — CARVEDILOL 3.125 MG PO TABS: 3.1250 mg | ORAL_TABLET | Freq: Two times a day (BID) | ORAL | 3 refills | Status: DC

## 2017-05-16 NOTE — Progress Notes (Signed)
Chief Complaint  Patient presents with  . Coronary Artery Disease     History of Present Illness: 47 yo female with history of CAD s/p 4VCABG 2011, mitral regurgitation s/p repair, chronic diastolic CHF, DM, HTN, HLD who is here today for cardiac follow up. Cardiac cath  In December 2010 showed severe proximal LAD stenosis, severe mid intermediate disease with occluded mid RCA and anomalous Circumflex with severe disease. She also had severe mitral regurgitation. She underwent a 4V CABG  (LIMA to LAD, SVG to subbranch of ramus intermediate, SVG to lateral subbranch of ramus intermediate, SVG to distal RCA) and mitral valve repair January 2011. Last echo September 2016 with normal LV systolic function, stable mitral valve repair. She had chest pain in June 2018 and her nuclear stress test showed ischemia. Cardiac cath on 09/22/16 showed progression of her CAD. LV function was normal. I placed a drug eluting stent in the vein graft to the PDA, a drug eluting stent in the native Ramus intermediate branch and a drug eluting stent in the protected left main. Repeat cardiac cath 02/07/17 and found to have continued occlusion of all three native vessels with patent LIMA to LAD, occluded SVG to ramus x 2, patent SVG to PDA but severe restenosis in the ramus intermediate stent. This was treated with another drug eluting stent. Her BP was soft in the hospital so Norvasc was stopped and doses of Coreg and Lisinopril were stopped.   She is here today for follow up. The patient denies any chest pain, dyspnea, palpitations, lower extremity edema, orthopnea, PND, dizziness, near syncope or syncope. She reports trouble sleeping at night and daytime fatigue. She snores at night. She is still in cardiac rehab and doing well with exercise.    Primary Care Physician: Lucianne Lei, MD  Past Medical History:  Diagnosis Date  . Chronic diastolic CHF (congestive heart failure) ([place])   . Coronary artery disease    a. 4v  CABG  (LIMA to LAD, SVG to subbranch of ramus intermediate, SVG to lateral subbranch of ramus intermediate, SVG to distal RCA) and mitral valve repair January 2011. b. Abnormal nuc 09/2016 s/p  drug eluting stent to the vein graft to the PDA, a drug eluting stent in the native Ramus intermediate branch and a drug eluting stent in the protected left main. c. DES to ramus intermediate 02/2017.  . Diabetes mellitus   . H/O emotional problems   . Headache, migraine   . High cholesterol   . History of blood transfusion   . Hypertension   . MI, old   . MRSA (methicillin resistant Staphylococcus aureus)   . MVP (mitral valve prolapse)   . Neuromuscular disorder ([place])   . Neuropathy   . S/P mitral valve repair   . Sebaceous cyst     Past Surgical History:  Procedure Laterality Date  . ABDOMINAL HYSTERECTOMY    . CARDIAC CATHETERIZATION  09/22/2016  . CORONARY ARTERY BYPASS GRAFT  with valve repair  . CORONARY STENT INTERVENTION N/A 09/22/2016   Procedure: Coronary Stent Intervention;  Surgeon: Burnell Blanks, MD;  Location: [facility];  Service: Cardiovascular;  Laterality: N/A;  . CORONARY STENT INTERVENTION N/A 02/07/2017   Procedure: CORONARY STENT INTERVENTION;  Surgeon: Martinique, Peter M, MD;  Location: [facility];  Service: Cardiovascular;  Laterality: N/A;  . CORONARY STENT PLACEMENT  09/22/2016  . LEFT HEART CATH AND CORS/GRAFTS ANGIOGRAPHY N/A 09/22/2016   Procedure: Left Heart Cath and  Cors/Grafts Angiography;  Surgeon: Burnell Blanks, MD;  Location: [facility];  Service: Cardiovascular;  Laterality: N/A;  . LEFT HEART CATH AND CORS/GRAFTS ANGIOGRAPHY N/A 02/07/2017   Procedure: LEFT HEART CATH AND CORS/GRAFTS ANGIOGRAPHY;  Surgeon: Martinique, Peter M, MD;  Location: [facility];  Service: Cardiovascular;  Laterality: N/A;  . SKIN GRAFT      Current Outpatient Medications  Medication Sig Dispense Refill  . aspirin 81 MG tablet Take 81 mg by  mouth daily.     . clopidogrel (PLAVIX) 75 MG tablet Take 1 tablet (75 mg total) by mouth daily with breakfast. 90 tablet 4  . fluticasone (FLONASE) 50 MCG/ACT nasal spray Place 1 spray into both nostrils 2 (two) times daily.     . furosemide (LASIX) 20 MG tablet TAKE ONE TABLET BY MOUTH DAILY AS NEEDED FOR SWELLING 30 tablet 9  . insulin aspart (NOVOLOG FLEXPEN) 100 UNIT/ML injection Inject 0-12 Units into the skin See admin instructions. Per sliding scale Under 150 no units 150-199 take 8 units 200-249 take 10 units 250-299 take 12 units    . lisinopril (PRINIVIL,ZESTRIL) 5 MG tablet Take 1 tablet (5 mg total) daily by mouth. 30 tablet 6  . LYRICA CR 165 MG TB24 Take 165 mg daily by mouth.     . montelukast (SINGULAIR) 10 MG tablet Take 10 mg by mouth at bedtime.    . Naloxone HCl (EVZIO) 0.4 MG/0.4ML SOAJ Inject 0.4 mg as directed as needed (for possible overdose).     . nitroGLYCERIN (NITROSTAT) 0.4 MG SL tablet Place 1 tablet (0.4 mg total) under the tongue every 5 (five) minutes as needed for chest pain. 25 tablet 6  . oxyCODONE-acetaminophen (PERCOCET) 10-325 MG per tablet Take 1 tablet by mouth 3 (three) times daily.    . rosuvastatin (CRESTOR) 40 MG tablet Take 1 tablet (40 mg total) every evening by mouth. 30 tablet 6  . Semaglutide 1 MG/DOSE SOPN Inject 1 mg once a week into the skin.    . SitaGLIPtin-MetFORMIN HCl (JANUMET XR) 50-1000 MG TB24 Take 1 tablet by mouth 2 (two) times daily.    . TRESIBA FLEXTOUCH 100 UNIT/ML SOPN FlexTouch Pen Inject 40 Units as directed at bedtime.   4  . carvedilol (COREG) 3.125 MG tablet Take 1 tablet (3.125 mg total) by mouth 2 (two) times daily. 180 tablet 3  . zolpidem (AMBIEN) 5 MG tablet Take 1 tablet (5 mg total) by mouth at bedtime as needed for sleep. 30 tablet 3   No current facility-administered medications for this visit.     Allergies  Allergen Reactions  . Metformin Diarrhea and Nausea And Vomiting    Immediate release tablets (not  metformin ER, pt currently taking)    Social History   Socioeconomic History  . Marital status: Divorced    Spouse name: Not on file  . Number of children: 2  . Years of education: Not on file  . Highest education level: Not on file  Social Needs  . Financial resource strain: Not on file  . Food insecurity - worry: Not on file  . Food insecurity - inability: Not on file  . Transportation needs - medical: No  . Transportation needs - non-medical: No  Occupational History  . Occupation: Disability  Tobacco Use  . Smoking status: Former Smoker    Packs/day: 1.00    Years: 28.00    Pack years: 28.00    Types: Cigarettes    Last attempt  to quit: 03/31/2009    Years since quitting: 8.1  . Smokeless tobacco: Never Used  Substance and Sexual Activity  . Alcohol use: No  . Drug use: No  . Sexual activity: Yes    Birth control/protection: Surgical    Comment: partial hysterectomy  Other Topics Concern  . Not on file  Social History Narrative  . Not on file    Family History  Problem Relation Age of Onset  . Heart disease Father   . Asthma Father   . Hypertension Father   . Diabetes Father   . Arthritis Father   . Heart disease Mother   . Hypertension Mother   . Diabetes Mother   . Migraines Mother   . Stroke Mother   . Emphysema Maternal Grandmother   . Hypertension Brother   . Diabetes Brother     Review of Systems:  As stated in the HPI and otherwise negative.   BP 117/77   Pulse 70   Ht 5' 4.5" (1.638 m)   Wt 208 lb 9.6 oz (94.6 kg)   SpO2 98%   BMI 35.25 kg/m   Physical Examination:  General: Well developed, well nourished, NAD  HEENT: OP clear, mucus membranes moist  SKIN: warm, dry. No rashes. Neuro: No focal deficits  Musculoskeletal: Muscle strength 5/5 all ext  Psychiatric: Mood and affect normal  Neck: No JVD, no carotid bruits, no thyromegaly, no lymphadenopathy.  Lungs:Clear bilaterally, no wheezes, rhonci, crackles Cardiovascular:  Regular rate and rhythm. No murmurs, gallops or rubs. Abdomen:Soft. Bowel sounds present. Non-tender.  Extremities: No lower extremity edema. Pulses are 2 + in the bilateral DP/PT.  Echo September 2016: Left ventricle: The cavity size was normal. Wall thickness was   normal. Systolic function was normal. The estimated ejection   fraction was in the range of 55% to 60%. Wall motion was normal;   there were no regional wall motion abnormalities. Doppler   parameters are consistent with abnormal left ventricular   relaxation (grade 1 diastolic dysfunction). - Aortic valve: There was no stenosis. - Mitral valve: Status post mitral valve repair. Probably mild   mitral stenosis. There was no significant regurgitation. Mean   gradient (D): 6 mm Hg. Valve area by pressure half-time: 1.3   cm^2. - Right ventricle: The cavity size was normal. Systolic function   was mildly reduced. - Pulmonary arteries: No complete TR doppler jet so unable to   estimate PA systolic pressure. - Inferior vena cava: The vessel was normal in size. The   respirophasic diameter changes were in the normal range (>= 50%),   consistent with normal central venous pressure.  EKG:  EKG is not ordered today. The ekg ordered today demonstrates  Recent Labs: 02/08/2017: BUN 9; Creatinine, Ser 0.69; Hemoglobin 11.4; Platelets 256; Potassium 3.5; Sodium 137 04/12/2017: ALT 16   Lipid Panel  Lipid Panel     Component Value Date/Time   CHOL 126 04/12/2017 0842   TRIG 164 (H) 04/12/2017 0842   HDL 32 (L) 04/12/2017 0842   CHOLHDL 3.9 04/12/2017 0842   CHOLHDL 8.9 04/03/2009 0320   VLDL 54 (H) 04/03/2009 0320   LDLCALC 61 04/12/2017 0842    Wt Readings from Last 3 Encounters:  05/16/17 208 lb 9.6 oz (94.6 kg)  04/10/17 205 lb 7.5 oz (93.2 kg)  02/15/17 204 lb 6.4 oz (92.7 kg)     Other studies Reviewed: Additional studies/ records that were reviewed today include: . Review of the above records demonstrates:  Assessment and Plan:   1. CAD without angina: No chest pain suggestive of angina. Will continue ASA, Plavix, high dose statin and beta blocker.       2. HYPERLIPIDEMIA: Lipids well controlled. Will continue statin.   3. HYPERTENSION: BP is controlled. No changes.   4. TOBACCO ABUSE, in remission: She stopped smoking in 2010.   5. Mitral regurgitation: Echo in 2016 showed that the mitral valve repair was stable.   6. Chronic diastolic CHF: Weight is stable. No LE edema. Continue to use Lasix prn.    7. Leg pain: ABI normal November 2018.   8. Insomnia: She is having trouble sleeping and daytime fatigue. I have discussed a sleep study. She wishes to try a sleep aid first. Will start Ambien 5 mg as needed for sleep.   9. Fatigue. Lower Coreg to 3.125 mg po BID. BP is stable.   Current medicines are reviewed at length with the patient today.  The patient does not have concerns regarding medicines.  The following changes have been made:  no change  Labs/ tests ordered today include:   No orders of the defined types were placed in this encounter.   Disposition:   FU with me in 6  months  Signed, Lauree Chandler, MD 05/16/2017 11:09 AM    [facility] [place], [place], [place]  [address] Phone: [phone]; Fax: [phone]

## 2017-05-16 NOTE — Patient Instructions (Signed)
Medication Instructions:  Your physician has recommended you make the following change in your medication:  Decrease Coreg to 3.125 mg by mouth twice daily.   Labwork: none  Testing/Procedures: none  Follow-Up: Your physician recommends that you schedule a follow-up appointment in: 4 months. Scheduled for June 24,2019 at 9:40    Any Other Special Instructions Will Be Listed Below (If Applicable).     If you need a refill on your cardiac medications before your next appointment, please call your pharmacy.

## 2017-05-18 ENCOUNTER — Encounter (HOSPITAL_COMMUNITY): Payer: Medicare Other

## 2017-05-18 ENCOUNTER — Encounter (HOSPITAL_COMMUNITY)
Admission: RE | Admit: 2017-05-18 | Discharge: 2017-05-18 | Disposition: A | Discharge status: Home / Self Care | Payer: Medicare Other | Source: Ambulatory Visit | Attending: Cardiovascular Disease | Admitting: Cardiovascular Disease

## 2017-05-18 DIAGNOSIS — Z955 Presence of coronary angioplasty implant and graft: Secondary | ICD-10-CM | POA: Diagnosis not present

## 2017-05-18 DIAGNOSIS — Z48812 Encounter for surgical aftercare following surgery on the circulatory system: Secondary | ICD-10-CM | POA: Diagnosis not present

## 2017-05-21 ENCOUNTER — Encounter (HOSPITAL_COMMUNITY): Payer: Medicare Other

## 2017-05-22 ENCOUNTER — Encounter (HOSPITAL_COMMUNITY): Payer: Self-pay | Admitting: *Deleted

## 2017-05-22 DIAGNOSIS — Z955 Presence of coronary angioplasty implant and graft: Secondary | ICD-10-CM

## 2017-05-22 NOTE — Progress Notes (Signed)
Cardiac Individual Treatment Plan  Patient Details  Name: Elizabeth Park MRN: [id number] Date of Birth: 1970-04-17 Referring Provider:     CARDIAC REHAB PHASE II ORIENTATION from 04/10/2017 in [place]  Referring Provider  Delorise Royals MD      Initial Encounter Date:    CARDIAC REHAB PHASE II ORIENTATION from 04/10/2017 in [place]  Date  04/10/17  Referring Provider  Delorise Royals MD      Visit Diagnosis: Status post coronary artery stent placement DES Intermediate Ramus 02/07/17  Patient's Home Medications on Admission:  Current Outpatient Medications:  .  aspirin 81 MG tablet, Take 81 mg by mouth daily. , Disp: , Rfl:  .  carvedilol (COREG) 3.125 MG tablet, Take 1 tablet (3.125 mg total) by mouth 2 (two) times daily., Disp: 180 tablet, Rfl: 3 .  clopidogrel (PLAVIX) 75 MG tablet, Take 1 tablet (75 mg total) by mouth daily with breakfast., Disp: 90 tablet, Rfl: 4 .  fluticasone (FLONASE) 50 MCG/ACT nasal spray, Place 1 spray into both nostrils 2 (two) times daily. , Disp: , Rfl:  .  furosemide (LASIX) 20 MG tablet, TAKE ONE TABLET BY MOUTH DAILY AS NEEDED FOR SWELLING, Disp: 30 tablet, Rfl: 9 .  insulin aspart (NOVOLOG FLEXPEN) 100 UNIT/ML injection, Inject 0-12 Units into the skin See admin instructions. Per sliding scale Under 150 no units 150-199 take 8 units 200-249 take 10 units 250-299 take 12 units, Disp: , Rfl:  .  lisinopril (PRINIVIL,ZESTRIL) 5 MG tablet, Take 1 tablet (5 mg total) daily by mouth., Disp: 30 tablet, Rfl: 6 .  LYRICA CR 165 MG TB24, Take 165 mg daily by mouth. , Disp: , Rfl:  .  montelukast (SINGULAIR) 10 MG tablet, Take 10 mg by mouth at bedtime., Disp: , Rfl:  .  Naloxone HCl (EVZIO) 0.4 MG/0.4ML SOAJ, Inject 0.4 mg as directed as needed (for possible overdose). , Disp: , Rfl:  .  nitroGLYCERIN (NITROSTAT) 0.4 MG SL tablet, Place 1 tablet (0.4 mg total) under the tongue every 5  (five) minutes as needed for chest pain., Disp: 25 tablet, Rfl: 6 .  oxyCODONE-acetaminophen (PERCOCET) 10-325 MG per tablet, Take 1 tablet by mouth 3 (three) times daily., Disp: , Rfl:  .  rosuvastatin (CRESTOR) 40 MG tablet, Take 1 tablet (40 mg total) every evening by mouth., Disp: 30 tablet, Rfl: 6 .  Semaglutide 1 MG/DOSE SOPN, Inject 1 mg once a week into the skin., Disp: , Rfl:  .  SitaGLIPtin-MetFORMIN HCl (JANUMET XR) 50-1000 MG TB24, Take 1 tablet by mouth 2 (two) times daily., Disp: , Rfl:  .  TRESIBA FLEXTOUCH 100 UNIT/ML SOPN FlexTouch Pen, Inject 40 Units as directed at bedtime. , Disp: , Rfl: 4 .  zolpidem (AMBIEN) 5 MG tablet, Take 1 tablet (5 mg total) by mouth at bedtime as needed for sleep., Disp: 30 tablet, Rfl: 3  Past Medical History: Past Medical History:  Diagnosis Date  . Chronic diastolic CHF (congestive heart failure) ([place])   . Coronary artery disease    a. 4v CABG  (LIMA to LAD, SVG to subbranch of ramus intermediate, SVG to lateral subbranch of ramus intermediate, SVG to distal RCA) and mitral valve repair January 2011. b. Abnormal nuc 09/2016 s/p  drug eluting stent to the vein graft to the PDA, a drug eluting stent in the native Ramus intermediate branch and a drug eluting stent in the protected left main. c. DES to ramus  intermediate 02/2017.  . Diabetes mellitus   . H/O emotional problems   . Headache, migraine   . High cholesterol   . History of blood transfusion   . Hypertension   . MI, old   . MRSA (methicillin resistant Staphylococcus aureus)   . MVP (mitral valve prolapse)   . Neuromuscular disorder ([place])   . Neuropathy   . S/P mitral valve repair   . Sebaceous cyst     Tobacco Use: Social History   Tobacco Use  Smoking Status Former Smoker  . Packs/day: 1.00  . Years: 28.00  . Pack years: 28.00  . Types: Cigarettes  . Last attempt to quit: 03/31/2009  . Years since quitting: 8.1  Smokeless Tobacco Never Used    Labs: Recent Nurse, [facility]    Labs for ITP Cardiac and Pulmonary Rehab Latest Ref Rng & Units 04/10/2009 04/26/2009 05/28/2009 09/02/2009 04/12/2017   Cholestrol 100 - 199 mg/dL - - - - 126   LDLCALC 0 - 99 mg/dL - - - - 61   HDL >39 mg/dL - - - - 32(L)   Trlycerides 0 - 149 mg/dL - - - - 164(H)   Hemoglobin A1c % - 8.0(H) - 6.5 -   PHART 7.350 - 7.400 - - 7.454(H) - -   PCO2ART 35.0 - 45.0 mmHg - - 33.9(L) - -   HCO3 20.0 - 24.0 mEq/L - - 23.4 - -   TCO2 0 - 100 mmol/L 39 - 24.5 - -   ACIDBASEDEF 0.0 - 2.0 mmol/L - - 0.0 - -   O2SAT % - - 97.9 - -      Capillary Blood Glucose: Lab Results  Component Value Date   GLUCAP 170 (H) 05/02/2017   GLUCAP 131 (H) 04/27/2017   GLUCAP 195 (H) 04/27/2017   GLUCAP 139 (H) 04/25/2017   GLUCAP 155 (H) 04/25/2017     Exercise Target Goals:    Exercise Program Goal: Individual exercise prescription set using results from initial 6 min walk test and THRR while considering  patient's activity barriers and safety.   Exercise Prescription Goal: Initial exercise prescription builds to 30-45 minutes a day of aerobic activity, 2-3 days per week.  Home exercise guidelines will be given to patient during program as part of exercise prescription that the participant will acknowledge.  Activity Barriers & Risk Stratification: Activity Barriers & Cardiac Risk Stratification - 04/10/17 0931      Activity Barriers & Cardiac Risk Stratification   Activity Barriers  Deconditioning;Muscular Weakness    Cardiac Risk Stratification  High       6 Minute Walk: 6 Minute Walk    Row Name 04/10/17 1002 04/10/17 1043       6 Minute Walk   Phase  Initial  -    Distance  -  1466 feet    Walk Time  6 minutes  -    # of Rest Breaks  0  -    MPH  -  2.78    METS  -  3.78    RPE  12  12    VO2 Peak  -  13.2    Symptoms  No  No    Resting HR  65 bpm  -    Resting BP  94/62  -    Resting Oxygen Saturation   99 %  -    Exercise Oxygen Saturation  during 6 min walk  100 %   -  Max Ex. HR  109 bpm  -    Max Ex. BP  98/70  -    2 Minute Post BP  102/70  -       Oxygen Initial Assessment:   Oxygen Re-Evaluation:   Oxygen Discharge (Final Oxygen Re-Evaluation):   Initial Exercise Prescription: Initial Exercise Prescription - 04/10/17 1000      Date of Initial Exercise RX and Referring Provider   Date  04/10/17    Referring Provider  Delorise Royals MD      Treadmill   MPH  2.6    Grade  0    Minutes  10    METs  2.99      Bike   Level  0.8    Minutes  10    METs  2.59      NuStep   Level  3    SPM  80    Minutes  10    METs  2      Prescription Details   Frequency (times per week)  3    Duration  Progress to 30 minutes of continuous aerobic without signs/symptoms of physical distress      Intensity   THRR 40-80% of Max Heartrate  69-138    Ratings of Perceived Exertion  11-15    Perceived Dyspnea  0-4      Progression   Progression  Continue to progress workloads to maintain intensity without signs/symptoms of physical distress.      Resistance Training   Training Prescription  Yes    Weight  3lbs    Reps  10-15       Perform Capillary Blood Glucose checks as needed.  Exercise Prescription Changes:  Exercise Prescription Changes    Row Name 04/25/17 1100 05/02/17 0952 05/18/17 0949         Response to Exercise   Blood Pressure (Admit)  120/80  98/70  100/72     Blood Pressure (Exercise)  136/82  118/66  132/78     Blood Pressure (Exit)  98/70  110/75  102/66     Heart Rate (Admit)  69 bpm  73 bpm  69 bpm     Heart Rate (Exercise)  94 bpm  117 bpm  88 bpm     Heart Rate (Exit)  61 bpm  68 bpm  66 bpm     Rating of Perceived Exertion (Exercise)  12  12  12      Symptoms  none  none  none     Duration  Continue with 30 min of aerobic exercise without signs/symptoms of physical distress.  Continue with 30 min of aerobic exercise without signs/symptoms of physical distress.  Continue with 30 min of aerobic  exercise without signs/symptoms of physical distress.     Intensity  THRR unchanged  THRR unchanged  THRR unchanged       Progression   Progression  Continue to progress workloads to maintain intensity without signs/symptoms of physical distress.  Continue to progress workloads to maintain intensity without signs/symptoms of physical distress.  Continue to progress workloads to maintain intensity without signs/symptoms of physical distress.     Average METs  2.8  2.7  2.7       Resistance Training   Training Prescription  No Relaxation today  No Relaxation today  Yes     Weight  -  -  3lbs     Reps  -  -  10-15  Time  -  -  10 Minutes       Interval Training   Interval Training  No  No  No       Treadmill   MPH  2.6  2.6  2.6     Grade  0  0  0     Minutes  10  10  10      METs  2.99  2.99  2.99       Bike   Level  0.8  0.8  0.8     Minutes  10  10  10      METs  2.62  2.6  2.59       NuStep   Level  3  5  5      SPM  85  85  85     Minutes  10  10  10      METs  2.8  2.4  2.5       Home Exercise Plan   Plans to continue exercise at  -  -  Home (comment) Patient also planning on joining [employer].     Frequency  -  -  Add 4 additional days to program exercise sessions.     Initial Home Exercises Provided  -  -  05/04/17        Exercise Comments:  Exercise Comments    Row Name 04/25/17 1112 05/02/17 1014 05/04/17 0958       Exercise Comments  Patient restarted cardiac rehab today and tolerated low-moderate intensity without difficulty.   Reviewed METs with patient.  Reviewed home exercise guidelines with patient.        Exercise Goals and Review:  Exercise Goals    Row Name 04/10/17 1004             Exercise Goals   Increase Physical Activity  Yes       Intervention  Provide advice, education, support and counseling about physical activity/exercise needs.       Increase Strength and Stamina  Yes       Intervention  Provide advice, education, support  and counseling about physical activity/exercise needs.;Develop an individualized exercise prescription for aerobic and resistive training based on initial evaluation findings, risk stratification, comorbidities and participant's personal goals.       Expected Outcomes  Achievement of increased cardiorespiratory fitness and enhanced flexibility, muscular endurance and strength shown through measurements of functional capacity and personal statement of participant.       Able to understand and use rate of perceived exertion (RPE) scale  Yes       Intervention  Provide education and explanation on how to use RPE scale       Expected Outcomes  Short Term: Able to use RPE daily in rehab to express subjective intensity level;Long Term:  Able to use RPE to guide intensity level when exercising independently       Knowledge and understanding of Target Heart Rate Range (THRR)  Yes       Intervention  Provide education and explanation of THRR including how the numbers were predicted and where they are located for reference       Expected Outcomes  Short Term: Able to state/look up THRR;Long Term: Able to use THRR to govern intensity when exercising independently;Short Term: Able to use daily as guideline for intensity in rehab       Able to check pulse independently  Yes       Intervention  Provide education and demonstration on how to check pulse in carotid and radial arteries.;Review the importance of being able to check your own pulse for safety during independent exercise       Expected Outcomes  Short Term: Able to explain why pulse checking is important during independent exercise;Long Term: Able to check pulse independently and accurately       Understanding of Exercise Prescription  Yes       Intervention  Provide education, explanation, and written materials on patient's individual exercise prescription       Expected Outcomes  Short Term: Able to explain program exercise prescription;Long Term: Able to  explain home exercise prescription to exercise independently          Exercise Goals Re-Evaluation : Exercise Goals Re-Evaluation    Row Name 04/25/17 1112 05/04/17 0958           Exercise Goal Re-Evaluation   Exercise Goals Review  Increase Physical Activity  Understanding of Exercise Prescription;Able to understand and use rate of perceived exertion (RPE) scale;Able to check pulse independently;Knowledge and understanding of Target Heart Rate Range (THRR)      Comments  Patient tolerated first day of exercise well. No complaints with exercise  Reviewed home exercise guidelines with patient including THRR, RPE scale and endpoints for exercise. Pt is able to check pulse indpendently. Pt is walking daily 30-40 minutes and also plans to exercise at [employer].      Expected Outcomes  Increase workloads as tolerated.  Patient will continue daily exercise routine to achieve personal health and fitness goals.          Discharge Exercise Prescription (Final Exercise Prescription Changes): Exercise Prescription Changes - 05/18/17 0949      Response to Exercise   Blood Pressure (Admit)  100/72    Blood Pressure (Exercise)  132/78    Blood Pressure (Exit)  102/66    Heart Rate (Admit)  69 bpm    Heart Rate (Exercise)  88 bpm    Heart Rate (Exit)  66 bpm    Rating of Perceived Exertion (Exercise)  12    Symptoms  none    Duration  Continue with 30 min of aerobic exercise without signs/symptoms of physical distress.    Intensity  THRR unchanged      Progression   Progression  Continue to progress workloads to maintain intensity without signs/symptoms of physical distress.    Average METs  2.7      Resistance Training   Training Prescription  Yes    Weight  3lbs    Reps  10-15    Time  10 Minutes      Interval Training   Interval Training  No      Treadmill   MPH  2.6    Grade  0    Minutes  10    METs  2.99      Bike   Level  0.8    Minutes  10    METs  2.59       NuStep   Level  5    SPM  85    Minutes  10    METs  2.5      Home Exercise Plan   Plans to continue exercise at  Home (comment) Patient also planning on joining [employer].    Frequency  Add 4 additional days to program exercise sessions.    Initial Home Exercises Provided  05/04/17  Nutrition:  Target Goals: Understanding of nutrition guidelines, daily intake of sodium 1500mg , cholesterol 200mg , calories 30% from fat and 7% or less from saturated fats, daily to have 5 or more servings of fruits and vegetables.  Biometrics: Pre Biometrics - 04/10/17 1145      Pre Biometrics   Height  5' 4.5" (1.638 m)    Weight  205 lb 7.5 oz (93.2 kg)    Waist Circumference  45 inches    Hip Circumference  44.5 inches    Waist to Hip Ratio  1.01 %    BMI (Calculated)  34.74    Triceps Skinfold  21 mm    % Body Fat  43.2 %    Grip Strength  36 kg    Flexibility  10.5 in    Single Leg Stand  18 seconds        Nutrition Therapy Plan and Nutrition Goals: Nutrition Therapy & Goals - 04/10/17 1324      Nutrition Therapy   Diet  Carb Modified, Heart Healthy      Personal Nutrition Goals   Nutrition Goal  Pt to identify food quantities necessary to achieve weight loss of 6-24 lb (2.7-10.9 kg) at graduation from cardiac rehab. Goal wt of 180 lb desired.       Intervention Plan   Intervention  Prescribe, educate and counsel regarding individualized specific dietary modifications aiming towards targeted core components such as weight, hypertension, lipid management, diabetes, heart failure and other comorbidities.    Expected Outcomes  Short Term Goal: Understand basic principles of dietary content, such as calories, fat, sodium, cholesterol and nutrients.;Long Term Goal: Adherence to prescribed nutrition plan.       Nutrition Assessments: Nutrition Assessments - 04/10/17 1325      MEDFICTS Scores   Pre Score  51       Nutrition Goals Re-Evaluation:   Nutrition Goals  Re-Evaluation:   Nutrition Goals Discharge (Final Nutrition Goals Re-Evaluation):   Psychosocial: Target Goals: Acknowledge presence or absence of significant depression and/or stress, maximize coping skills, provide positive support system. Participant is able to verbalize types and ability to use techniques and skills needed for reducing stress and depression.  Initial Review & Psychosocial Screening: Initial Psych Review & Screening - 04/10/17 1136      Initial Review   Current issues with  None Identified      Family Dynamics   Good Support System?  Yes Liviana has her boyfriend, son and parents for support       Barriers   Psychosocial barriers to participate in program  There are no identifiable barriers or psychosocial needs.      Screening Interventions   Interventions  Encouraged to exercise;Provide feedback about the scores to participant Reviewed qualithy of life scores with Marielle        Quality of Life Scores: Quality of Life - 04/10/17 1148      Quality of Life Scores   Health/Function Pre  24 %    Socioeconomic Pre  27 %    Psych/Spiritual Pre  18.4 %    Family Pre  27.6 %    GLOBAL Pre  24.09 %      Scores of 19 and below usually indicate a poorer quality of life in these areas.  A difference of  2-3 points is a clinically meaningful difference.  A difference of 2-3 points in the total score of the Quality of Life Index has been associated with significant improvement in overall  quality of life, self-image, physical symptoms, and general health in studies assessing change in quality of life.  PHQ-9: Recent Review Flowsheet Data    Depression screen [facility] 2/9 04/25/2017 04/10/2017 11/01/2016 08/09/2016 09/13/2015   Decreased Interest 0 0 0 0 0   Down, Depressed, Hopeless 0 0  0 0 0   PHQ - 2 Score 0 0 0 0 0     Interpretation of Total Score  Total Score Depression Severity:  1-4 = Minimal depression, 5-9 = Mild depression, 10-14 = Moderate depression, 15-19 = Moderately  severe depression, 20-27 = Severe depression   Psychosocial Evaluation and Intervention:   Psychosocial Re-Evaluation: Psychosocial Re-Evaluation    [place] Name 05/22/17 1637             Psychosocial Re-Evaluation   Current issues with  None Identified       Comments  no psychosocial needs identified, no interventions necessary.        Expected Outcomes  pt will exhibit positive outlook with good coping skills.        Interventions  Stress management education;Relaxation education;Encouraged to attend Cardiac Rehabilitation for the exercise       Continue Psychosocial Services   No Follow up required          Psychosocial Discharge (Final Psychosocial Re-Evaluation): Psychosocial Re-Evaluation - 05/22/17 1637      Psychosocial Re-Evaluation   Current issues with  None Identified    Comments  no psychosocial needs identified, no interventions necessary.     Expected Outcomes  pt will exhibit positive outlook with good coping skills.     Interventions  Stress management education;Relaxation education;Encouraged to attend Cardiac Rehabilitation for the exercise    Continue Psychosocial Services   No Follow up required       Vocational Rehabilitation: Provide vocational rehab assistance to qualifying candidates.   Vocational Rehab Evaluation & Intervention: Vocational Rehab - 04/10/17 0934      Initial Vocational Rehab Evaluation & Intervention   Assessment shows need for Vocational Rehabilitation  No pt denies interest in Voc Rehab at this time.       Education: Education Goals: Education classes will be provided on a weekly basis, covering required topics. Participant will state understanding/return demonstration of topics presented.  Learning Barriers/Preferences: Learning Barriers/Preferences - 04/10/17 0931      Learning Barriers/Preferences   Learning Barriers  Sight    Learning Preferences  Skilled Demonstration       Education Topics: Count Your Pulse:    -Group instruction provided by verbal instruction, demonstration, patient participation and written materials to support subject.  Instructors address importance of being able to find your pulse and how to count your pulse when at home without a heart monitor.  Patients get hands on experience counting their pulse with staff help and individually.   Heart Attack, Angina, and Risk Factor Modification:  -Group instruction provided by verbal instruction, video, and written materials to support subject.  Instructors address signs and symptoms of angina and heart attacks.    Also discuss risk factors for heart disease and how to make changes to improve heart health risk factors.   Functional Fitness:  -Group instruction provided by verbal instruction, demonstration, patient participation, and written materials to support subject.  Instructors address safety measures for doing things around the house.  Discuss how to get up and down off the floor, how to pick things up properly, how to safely get out of a chair without assistance, and balance  training.   Meditation and Mindfulness:  -Group instruction provided by verbal instruction, patient participation, and written materials to support subject.  Instructor addresses importance of mindfulness and meditation practice to help reduce stress and improve awareness.  Instructor also leads participants through a meditation exercise.    Stretching for Flexibility and Mobility:  -Group instruction provided by verbal instruction, patient participation, and written materials to support subject.  Instructors lead participants through series of stretches that are designed to increase flexibility thus improving mobility.  These stretches are additional exercise for major muscle groups that are typically performed during regular warm up and cool down.   Hands Only CPR:  -Group verbal, video, and participation provides a basic overview of AHA guidelines for  community CPR. Role-play of emergencies allow participants the opportunity to practice calling for help and chest compression technique with discussion of AED use.   Hypertension: -Group verbal and written instruction that provides a basic overview of hypertension including the most recent diagnostic guidelines, risk factor reduction with self-care instructions and medication management.    Nutrition I class: Heart Healthy Eating:  -Group instruction provided by PowerPoint slides, verbal discussion, and written materials to support subject matter. The instructor gives an explanation and review of the Therapeutic Lifestyle Changes diet recommendations, which includes a discussion on lipid goals, dietary fat, sodium, fiber, plant stanol/sterol esters, sugar, and the components of a well-balanced, healthy diet.   Nutrition II class: Lifestyle Skills:  -Group instruction provided by PowerPoint slides, verbal discussion, and written materials to support subject matter. The instructor gives an explanation and review of label reading, grocery shopping for heart health, heart healthy recipe modifications, and ways to make healthier choices when eating out.   Diabetes Question & Answer:  -Group instruction provided by PowerPoint slides, verbal discussion, and written materials to support subject matter. The instructor gives an explanation and review of diabetes co-morbidities, pre- and post-prandial blood glucose goals, pre-exercise blood glucose goals, signs, symptoms, and treatment of hypoglycemia and hyperglycemia, and foot care basics.   Diabetes Blitz:  -Group instruction provided by PowerPoint slides, verbal discussion, and written materials to support subject matter. The instructor gives an explanation and review of the physiology behind type 1 and type 2 diabetes, diabetes medications and rational behind using different medications, pre- and post-prandial blood glucose recommendations and  Hemoglobin A1c goals, diabetes diet, and exercise including blood glucose guidelines for exercising safely.    Portion Distortion:  -Group instruction provided by PowerPoint slides, verbal discussion, written materials, and food models to support subject matter. The instructor gives an explanation of serving size versus portion size, changes in portions sizes over the last 20 years, and what consists of a serving from each food group.   Stress Management:  -Group instruction provided by verbal instruction, video, and written materials to support subject matter.  Instructors review role of stress in heart disease and how to cope with stress positively.     Exercising on Your Own:  -Group instruction provided by verbal instruction, power point, and written materials to support subject.  Instructors discuss benefits of exercise, components of exercise, frequency and intensity of exercise, and end points for exercise.  Also discuss use of nitroglycerin and activating EMS.  Review options of places to exercise outside of rehab.  Review guidelines for sex with heart disease.   CARDIAC REHAB PHASE II EXERCISE from 12/06/2016 in [place]  Date  12/06/16  Educator  EP  Instruction Review Code (Retired)  2- meets goals/outcomes      Cardiac Drugs I:  -Group instruction provided by verbal instruction and written materials to support subject.  Instructor reviews cardiac drug classes: antiplatelets, anticoagulants, beta blockers, and statins.  Instructor discusses reasons, side effects, and lifestyle considerations for each drug class.   Cardiac Drugs II:  -Group instruction provided by verbal instruction and written materials to support subject.  Instructor reviews cardiac drug classes: angiotensin converting enzyme inhibitors (ACE-I), angiotensin II receptor blockers (ARBs), nitrates, and calcium channel blockers.  Instructor discusses reasons, side effects, and  lifestyle considerations for each drug class.   Anatomy and Physiology of the Circulatory System:  Group verbal and written instruction and models provide basic cardiac anatomy and physiology, with the coronary electrical and arterial systems. Review of: AMI, Angina, Valve disease, Heart Failure, Peripheral Artery Disease, Cardiac Arrhythmia, Pacemakers, and the ICD.   CARDIAC REHAB PHASE II EXERCISE from 12/06/2016 in [place]  Date  11/01/16  Instruction Review Code (Retired)  2- meets goals/outcomes      Other Education:  -Group or individual verbal, written, or video instructions that support the educational goals of the cardiac rehab program.   Holiday Eating Survival Tips:  -Group instruction provided by PowerPoint slides, verbal discussion, and written materials to support subject matter. The instructor gives patients tips, tricks, and techniques to help them not only survive but enjoy the holidays despite the onslaught of food that accompanies the holidays.   Knowledge Questionnaire Score: Knowledge Questionnaire Score - 04/10/17 0934      Knowledge Questionnaire Score   Pre Score  23/24       Core Components/Risk Factors/Patient Goals at Admission: Personal Goals and Risk Factors at Admission - 04/10/17 1048      Core Components/Risk Factors/Patient Goals on Admission   Goal Weight: Short Term  200 lb (90.7 kg)    Goal Weight: Long Term  190 lb (86.2 kg)       Core Components/Risk Factors/Patient Goals Review:  Goals and Risk Factor Review    Row Name 05/22/17 1633             Core Components/Risk Factors/Patient Goals Review   Personal Goals Review  Weight Management/Obesity;Hypertension;Diabetes;Lipids       Review  Valary's vital signs weight and CBG's have been stable at cardiac rehab.       Expected Outcomes  encourage regular attendance to get the full benefit of partcipating in phase 2 cardiac rehab.          Core  Components/Risk Factors/Patient Goals at Discharge (Final Review):  Goals and Risk Factor Review - 05/22/17 1633      Core Components/Risk Factors/Patient Goals Review   Personal Goals Review  Weight Management/Obesity;Hypertension;Diabetes;Lipids    Review  Lea's vital signs weight and CBG's have been stable at cardiac rehab.    Expected Outcomes  encourage regular attendance to get the full benefit of partcipating in phase 2 cardiac rehab.       ITP Comments: ITP Comments    Row Name 04/10/17 0930 05/22/17 1632         ITP Comments  Medical Director, Dr. Fransico Him  30 day ITP Review. Oluwanifemi's attendance has been sporadic at cardiac rehab. Catheleen has done well with exercise when she is able to attend.         Comments: See ITP comments.Barnet Pall, RN,BSN 05/23/2017 5:04 PM

## 2017-05-23 ENCOUNTER — Encounter (HOSPITAL_COMMUNITY): Payer: Medicare Other

## 2017-05-23 DIAGNOSIS — M79604 Pain in right leg: Secondary | ICD-10-CM | POA: Diagnosis not present

## 2017-05-23 DIAGNOSIS — G8929 Other chronic pain: Secondary | ICD-10-CM | POA: Diagnosis not present

## 2017-05-23 DIAGNOSIS — M79661 Pain in right lower leg: Secondary | ICD-10-CM | POA: Diagnosis not present

## 2017-05-23 DIAGNOSIS — M79662 Pain in left lower leg: Secondary | ICD-10-CM | POA: Diagnosis not present

## 2017-05-23 DIAGNOSIS — M545 Low back pain: Secondary | ICD-10-CM | POA: Diagnosis not present

## 2017-05-23 DIAGNOSIS — M79605 Pain in left leg: Secondary | ICD-10-CM | POA: Diagnosis not present

## 2017-05-25 ENCOUNTER — Encounter (HOSPITAL_COMMUNITY): Payer: Medicare Other

## 2017-05-28 ENCOUNTER — Encounter (HOSPITAL_COMMUNITY): Payer: Medicare Other

## 2017-05-28 ENCOUNTER — Telehealth (HOSPITAL_COMMUNITY): Payer: Self-pay | Admitting: Family Medicine

## 2017-05-30 ENCOUNTER — Encounter (HOSPITAL_COMMUNITY): Payer: Medicare Other

## 2017-06-01 ENCOUNTER — Encounter (HOSPITAL_COMMUNITY)
Admission: RE | Admit: 2017-06-01 | Discharge: 2017-06-01 | Disposition: A | Discharge status: Home / Self Care | Payer: Medicare Other | Source: Ambulatory Visit | Attending: Cardiovascular Disease | Admitting: Cardiovascular Disease

## 2017-06-01 ENCOUNTER — Encounter (HOSPITAL_COMMUNITY): Payer: Medicare Other

## 2017-06-01 DIAGNOSIS — Z955 Presence of coronary angioplasty implant and graft: Secondary | ICD-10-CM

## 2017-06-01 DIAGNOSIS — Z48812 Encounter for surgical aftercare following surgery on the circulatory system: Secondary | ICD-10-CM | POA: Insufficient documentation

## 2017-06-04 ENCOUNTER — Encounter (HOSPITAL_COMMUNITY): Payer: Medicare Other

## 2017-06-06 ENCOUNTER — Encounter (HOSPITAL_COMMUNITY): Payer: Medicare Other

## 2017-06-06 ENCOUNTER — Encounter (HOSPITAL_COMMUNITY)
Admission: RE | Admit: 2017-06-06 | Discharge: 2017-06-06 | Disposition: A | Discharge status: Home / Self Care | Payer: Medicare Other | Source: Ambulatory Visit | Attending: Cardiovascular Disease | Admitting: Cardiovascular Disease

## 2017-06-06 DIAGNOSIS — Z48812 Encounter for surgical aftercare following surgery on the circulatory system: Secondary | ICD-10-CM | POA: Diagnosis not present

## 2017-06-06 DIAGNOSIS — Z955 Presence of coronary angioplasty implant and graft: Secondary | ICD-10-CM | POA: Diagnosis not present

## 2017-06-08 ENCOUNTER — Encounter (HOSPITAL_COMMUNITY): Payer: Medicare Other

## 2017-06-11 DIAGNOSIS — R2232 Localized swelling, mass and lump, left upper limb: Secondary | ICD-10-CM | POA: Diagnosis not present

## 2017-06-13 ENCOUNTER — Encounter (HOSPITAL_COMMUNITY)
Admission: RE | Admit: 2017-06-13 | Discharge: 2017-06-13 | Disposition: A | Discharge status: Home / Self Care | Payer: Medicare Other | Source: Ambulatory Visit | Attending: Cardiovascular Disease | Admitting: Cardiovascular Disease

## 2017-06-13 DIAGNOSIS — Z955 Presence of coronary angioplasty implant and graft: Secondary | ICD-10-CM | POA: Diagnosis not present

## 2017-06-13 DIAGNOSIS — Z48812 Encounter for surgical aftercare following surgery on the circulatory system: Secondary | ICD-10-CM | POA: Diagnosis not present

## 2017-06-20 DIAGNOSIS — G894 Chronic pain syndrome: Secondary | ICD-10-CM | POA: Diagnosis not present

## 2017-06-20 DIAGNOSIS — M79604 Pain in right leg: Secondary | ICD-10-CM | POA: Diagnosis not present

## 2017-06-20 DIAGNOSIS — M79661 Pain in right lower leg: Secondary | ICD-10-CM | POA: Diagnosis not present

## 2017-06-20 DIAGNOSIS — M545 Low back pain: Secondary | ICD-10-CM | POA: Diagnosis not present

## 2017-06-20 DIAGNOSIS — M79605 Pain in left leg: Secondary | ICD-10-CM | POA: Diagnosis not present

## 2017-06-20 DIAGNOSIS — M79662 Pain in left lower leg: Secondary | ICD-10-CM | POA: Diagnosis not present

## 2017-06-21 ENCOUNTER — Encounter (HOSPITAL_COMMUNITY): Payer: Self-pay | Admitting: *Deleted

## 2017-06-21 DIAGNOSIS — Z955 Presence of coronary angioplasty implant and graft: Secondary | ICD-10-CM

## 2017-06-21 NOTE — Progress Notes (Addendum)
Cardiac Individual Treatment Plan  Patient Details  Name: Elizabeth Park MRN: [id number] Date of Birth: November 21, 1970 Referring Provider:     CARDIAC REHAB PHASE II ORIENTATION from 04/10/2017 in [facility]  Referring Provider  Delorise Royals MD      Initial Encounter Date:    CARDIAC REHAB PHASE II ORIENTATION from 04/10/2017 in [facility]  Date  04/10/17  Referring Provider  Delorise Royals MD      Visit Diagnosis: Status post coronary artery stent placement DES Intermediate Ramus 02/07/17  Patient's Home Medications on Admission:  Current Outpatient Medications:  .  aspirin 81 MG tablet, Take 81 mg by mouth daily. , Disp: , Rfl:  .  carvedilol (COREG) 3.125 MG tablet, Take 1 tablet (3.125 mg total) by mouth 2 (two) times daily., Disp: 180 tablet, Rfl: 3 .  clopidogrel (PLAVIX) 75 MG tablet, Take 1 tablet (75 mg total) by mouth daily with breakfast., Disp: 90 tablet, Rfl: 4 .  fluticasone (FLONASE) 50 MCG/ACT nasal spray, Place 1 spray into both nostrils 2 (two) times daily. , Disp: , Rfl:  .  furosemide (LASIX) 20 MG tablet, TAKE ONE TABLET BY MOUTH DAILY AS NEEDED FOR SWELLING, Disp: 30 tablet, Rfl: 9 .  insulin aspart (NOVOLOG FLEXPEN) 100 UNIT/ML injection, Inject 0-12 Units into the skin See admin instructions. Per sliding scale Under 150 no units 150-199 take 8 units 200-249 take 10 units 250-299 take 12 units, Disp: , Rfl:  .  lisinopril (PRINIVIL,ZESTRIL) 5 MG tablet, Take 1 tablet (5 mg total) daily by mouth., Disp: 30 tablet, Rfl: 6 .  LYRICA CR 165 MG TB24, Take 165 mg daily by mouth. , Disp: , Rfl:  .  montelukast (SINGULAIR) 10 MG tablet, Take 10 mg by mouth at bedtime., Disp: , Rfl:  .  Naloxone HCl (EVZIO) 0.4 MG/0.4ML SOAJ, Inject 0.4 mg as directed as needed (for possible overdose). , Disp: , Rfl:  .  nitroGLYCERIN (NITROSTAT) 0.4 MG SL tablet, Place 1 tablet (0.4 mg total) under the tongue every 5  (five) minutes as needed for chest pain., Disp: 25 tablet, Rfl: 6 .  oxyCODONE-acetaminophen (PERCOCET) 10-325 MG per tablet, Take 1 tablet by mouth 3 (three) times daily., Disp: , Rfl:  .  rosuvastatin (CRESTOR) 40 MG tablet, Take 1 tablet (40 mg total) every evening by mouth., Disp: 30 tablet, Rfl: 6 .  Semaglutide 1 MG/DOSE SOPN, Inject 1 mg once a week into the skin., Disp: , Rfl:  .  SitaGLIPtin-MetFORMIN HCl (JANUMET XR) 50-1000 MG TB24, Take 1 tablet by mouth 2 (two) times daily., Disp: , Rfl:  .  TRESIBA FLEXTOUCH 100 UNIT/ML SOPN FlexTouch Pen, Inject 40 Units as directed at bedtime. , Disp: , Rfl: 4 .  zolpidem (AMBIEN) 5 MG tablet, Take 1 tablet (5 mg total) by mouth at bedtime as needed for sleep., Disp: 30 tablet, Rfl: 3  Past Medical History: Past Medical History:  Diagnosis Date  . Chronic diastolic CHF (congestive heart failure) ([place])   . Coronary artery disease    a. 4v CABG  (LIMA to LAD, SVG to subbranch of ramus intermediate, SVG to lateral subbranch of ramus intermediate, SVG to distal RCA) and mitral valve repair January 2011. b. Abnormal nuc 09/2016 s/p  drug eluting stent to the vein graft to the PDA, a drug eluting stent in the native Ramus intermediate branch and a drug eluting stent in the protected left main. c. DES to ramus  intermediate 02/2017.  . Diabetes mellitus   . H/O emotional problems   . Headache, migraine   . High cholesterol   . History of blood transfusion   . Hypertension   . MI, old   . MRSA (methicillin resistant Staphylococcus aureus)   . MVP (mitral valve prolapse)   . Neuromuscular disorder ([place])   . Neuropathy   . S/P mitral valve repair   . Sebaceous cyst     Tobacco Use: Social History   Tobacco Use  Smoking Status Former Smoker  . Packs/day: 1.00  . Years: 28.00  . Pack years: 28.00  . Types: Cigarettes  . Last attempt to quit: 03/31/2009  . Years since quitting: 8.2  Smokeless Tobacco Never Used    Labs: Recent Review  Flowsheet Data    Labs for ITP Cardiac and Pulmonary Rehab Latest Ref Rng & Units 04/10/2009 04/26/2009 05/28/2009 09/02/2009 04/12/2017   Cholestrol 100 - 199 mg/dL - - - - 126   LDLCALC 0 - 99 mg/dL - - - - 61   HDL >39 mg/dL - - - - 32(L)   Trlycerides 0 - 149 mg/dL - - - - 164(H)   Hemoglobin A1c % - 8.0(H) - 6.5 -   PHART 7.350 - 7.400 - - 7.454(H) - -   PCO2ART 35.0 - 45.0 mmHg - - 33.9(L) - -   HCO3 20.0 - 24.0 mEq/L - - 23.4 - -   TCO2 0 - 100 mmol/L 39 - 24.5 - -   ACIDBASEDEF 0.0 - 2.0 mmol/L - - 0.0 - -   O2SAT % - - 97.9 - -      Capillary Blood Glucose: Lab Results  Component Value Date   GLUCAP 170 (H) 05/02/2017   GLUCAP 131 (H) 04/27/2017   GLUCAP 195 (H) 04/27/2017   GLUCAP 139 (H) 04/25/2017   GLUCAP 155 (H) 04/25/2017     Exercise Target Goals:    Exercise Program Goal: Individual exercise prescription set using results from initial 6 min walk test and THRR while considering  patient's activity barriers and safety.   Exercise Prescription Goal: Initial exercise prescription builds to 30-45 minutes a day of aerobic activity, 2-3 days per week.  Home exercise guidelines will be given to patient during program as part of exercise prescription that the participant will acknowledge.  Activity Barriers & Risk Stratification:   6 Minute Walk:   Oxygen Initial Assessment:   Oxygen Re-Evaluation:   Oxygen Discharge (Final Oxygen Re-Evaluation):   Initial Exercise Prescription:   Perform Capillary Blood Glucose checks as needed.  Exercise Prescription Changes:  Exercise Prescription Changes    Row Name 04/25/17 1100 05/02/17 0952 05/18/17 0949 06/13/17 0950       Response to Exercise   Blood Pressure (Admit)  120/80  98/70  100/72  118/70    Blood Pressure (Exercise)  136/82  118/66  132/78  110/72    Blood Pressure (Exit)  98/70  110/75  102/66  102/64    Heart Rate (Admit)  69 bpm  73 bpm  69 bpm  66 bpm    Heart Rate (Exercise)  94 bpm  117 bpm   88 bpm  98 bpm    Heart Rate (Exit)  61 bpm  68 bpm  66 bpm  58 bpm    Rating of Perceived Exertion (Exercise)  12  12  12  12     Symptoms  none  none  none  none    Duration  Continue with 30 min of aerobic exercise without signs/symptoms of physical distress.  Continue with 30 min of aerobic exercise without signs/symptoms of physical distress.  Continue with 30 min of aerobic exercise without signs/symptoms of physical distress.  Continue with 30 min of aerobic exercise without signs/symptoms of physical distress.    Intensity  THRR unchanged  THRR unchanged  THRR unchanged  THRR unchanged      Progression   Progression  Continue to progress workloads to maintain intensity without signs/symptoms of physical distress.  Continue to progress workloads to maintain intensity without signs/symptoms of physical distress.  Continue to progress workloads to maintain intensity without signs/symptoms of physical distress.  Continue to progress workloads to maintain intensity without signs/symptoms of physical distress.    Average METs  2.8  2.7  2.7  2.8      Resistance Training   Training Prescription  No Relaxation today  No Relaxation today  Yes  No Relaxation day, no weights.    Weight  -  -  3lbs  -    Reps  -  -  10-15  -    Time  -  -  10 Minutes  -      Interval Training   Interval Training  No  No  No  No      Treadmill   MPH  2.6  2.6  2.6  2.6    Grade  0  0  0  0    Minutes  10  10  10  10     METs  2.99  2.99  2.99  2.99      Bike   Level  0.8  0.8  0.8  1    Minutes  10  10  10  10     METs  2.62  2.6  2.59  2.98      NuStep   Level  3  5  5  5     SPM  85  85  85  85    Minutes  10  10  10  10     METs  2.8  2.4  2.5  2.4      Home Exercise Plan   Plans to continue exercise at  -  -  Home (comment) Patient also planning on joining [employer].  Home (comment)    Frequency  -  -  Add 4 additional days to program exercise sessions.  Add 4 additional days to program exercise  sessions.    Initial Home Exercises Provided  -  -  05/04/17  05/04/17       Exercise Comments:  Exercise Comments    Row Name 04/25/17 1112 05/02/17 1014 05/04/17 0958 06/18/17 1810     Exercise Comments  Patient restarted cardiac rehab today and tolerated low-moderate intensity without difficulty.   Reviewed METs with patient.  Reviewed home exercise guidelines with patient.  Patient out since 06/13/17. Tolerates moderate intensity exercise without issue at [facility].       Exercise Goals and Review:   Exercise Goals Re-Evaluation : Exercise Goals Re-Evaluation    Row Name 04/25/17 1112 05/04/17 0958 06/18/17 1809         Exercise Goal Re-Evaluation   Exercise Goals Review  Increase Physical Activity  Understanding of Exercise Prescription;Able to understand and use rate of perceived exertion (RPE) scale;Able to check pulse independently;Knowledge and understanding of Target Heart Rate Range (THRR)  Understanding of Exercise Prescription;Able to understand and use rate  of perceived exertion (RPE) scale;Able to check pulse independently;Knowledge and understanding of Target Heart Rate Range (THRR)     Comments  Patient tolerated first day of exercise well. No complaints with exercise  Reviewed home exercise guidelines with patient including THRR, RPE scale and endpoints for exercise. Pt is able to check pulse indpendently. Pt is walking daily 30-40 minutes and also plans to exercise at [employer].  Patient out since 06/13/17. Doing well with exercise thus far.     Expected Outcomes  Increase workloads as tolerated.  Patient will continue daily exercise routine to achieve personal health and fitness goals.  Unable to review goals, patient out since 06/13/17.         Discharge Exercise Prescription (Final Exercise Prescription Changes): Exercise Prescription Changes - 06/13/17 0950      Response to Exercise   Blood Pressure (Admit)  118/70    Blood Pressure (Exercise)  110/72    Blood  Pressure (Exit)  102/64    Heart Rate (Admit)  66 bpm    Heart Rate (Exercise)  98 bpm    Heart Rate (Exit)  58 bpm    Rating of Perceived Exertion (Exercise)  12    Symptoms  none    Duration  Continue with 30 min of aerobic exercise without signs/symptoms of physical distress.    Intensity  THRR unchanged      Progression   Progression  Continue to progress workloads to maintain intensity without signs/symptoms of physical distress.    Average METs  2.8      Resistance Training   Training Prescription  No Relaxation day, no weights.      Interval Training   Interval Training  No      Treadmill   MPH  2.6    Grade  0    Minutes  10    METs  2.99      Bike   Level  1    Minutes  10    METs  2.98      NuStep   Level  5    SPM  85    Minutes  10    METs  2.4      Home Exercise Plan   Plans to continue exercise at  Home (comment)    Frequency  Add 4 additional days to program exercise sessions.    Initial Home Exercises Provided  05/04/17       Nutrition:  Target Goals: Understanding of nutrition guidelines, daily intake of sodium 1500mg , cholesterol 200mg , calories 30% from fat and 7% or less from saturated fats, daily to have 5 or more servings of fruits and vegetables.  Biometrics:    Nutrition Therapy Plan and Nutrition Goals:   Nutrition Assessments:   Nutrition Goals Re-Evaluation:   Nutrition Goals Re-Evaluation:   Nutrition Goals Discharge (Final Nutrition Goals Re-Evaluation):   Psychosocial: Target Goals: Acknowledge presence or absence of significant depression and/or stress, maximize coping skills, provide positive support system. Participant is able to verbalize types and ability to use techniques and skills needed for reducing stress and depression.  Initial Review & Psychosocial Screening:   Quality of Life Scores:  Scores of 19 and below usually indicate a poorer quality of life in these areas.  A difference of  2-3 points is a  clinically meaningful difference.  A difference of 2-3 points in the total score of the Quality of Life Index has been associated with significant improvement in overall quality of life,  self-image, physical symptoms, and general health in studies assessing change in quality of life.  PHQ-9: Recent Review Flowsheet Data    Depression screen [facility] 2/9 04/25/2017 04/10/2017 11/01/2016 08/09/2016 09/13/2015   Decreased Interest 0 0 0 0 0   Down, Depressed, Hopeless 0 0  0 0 0   PHQ - 2 Score 0 0 0 0 0     Interpretation of Total Score  Total Score Depression Severity:  1-4 = Minimal depression, 5-9 = Mild depression, 10-14 = Moderate depression, 15-19 = Moderately severe depression, 20-27 = Severe depression   Psychosocial Evaluation and Intervention:   Psychosocial Re-Evaluation: Psychosocial Re-Evaluation    [place] Name 05/22/17 1637 06/21/17 1117           Psychosocial Re-Evaluation   Current issues with  None Identified  None Identified      Comments  no psychosocial needs identified, no interventions necessary.   no psychosocial needs identified, no interventions necessary.       Expected Outcomes  pt will exhibit positive outlook with good coping skills.   pt will exhibit positive outlook with good coping skills.       Interventions  Stress management education;Relaxation education;Encouraged to attend Cardiac Rehabilitation for the exercise  Stress management education;Relaxation education;Encouraged to attend Cardiac Rehabilitation for the exercise      Continue Psychosocial Services   No Follow up required  No Follow up required         Psychosocial Discharge (Final Psychosocial Re-Evaluation): Psychosocial Re-Evaluation - 06/21/17 1117      Psychosocial Re-Evaluation   Current issues with  None Identified    Comments  no psychosocial needs identified, no interventions necessary.     Expected Outcomes  pt will exhibit positive outlook with good coping skills.     Interventions   Stress management education;Relaxation education;Encouraged to attend Cardiac Rehabilitation for the exercise    Continue Psychosocial Services   No Follow up required       Vocational Rehabilitation: Provide vocational rehab assistance to qualifying candidates.   Vocational Rehab Evaluation & Intervention:   Education: Education Goals: Education classes will be provided on a weekly basis, covering required topics. Participant will state understanding/return demonstration of topics presented.  Learning Barriers/Preferences:   Education Topics: Count Your Pulse:  -Group instruction provided by verbal instruction, demonstration, patient participation and written materials to support subject.  Instructors address importance of being able to find your pulse and how to count your pulse when at home without a heart monitor.  Patients get hands on experience counting their pulse with staff help and individually.   Heart Attack, Angina, and Risk Factor Modification:  -Group instruction provided by verbal instruction, video, and written materials to support subject.  Instructors address signs and symptoms of angina and heart attacks.    Also discuss risk factors for heart disease and how to make changes to improve heart health risk factors.   Functional Fitness:  -Group instruction provided by verbal instruction, demonstration, patient participation, and written materials to support subject.  Instructors address safety measures for doing things around the house.  Discuss how to get up and down off the floor, how to pick things up properly, how to safely get out of a chair without assistance, and balance training.   Meditation and Mindfulness:  -Group instruction provided by verbal instruction, patient participation, and written materials to support subject.  Instructor addresses importance of mindfulness and meditation practice to help reduce stress and improve awareness.  Instructor  also leads  participants through a meditation exercise.    Stretching for Flexibility and Mobility:  -Group instruction provided by verbal instruction, patient participation, and written materials to support subject.  Instructors lead participants through series of stretches that are designed to increase flexibility thus improving mobility.  These stretches are additional exercise for major muscle groups that are typically performed during regular warm up and cool down.   Hands Only CPR:  -Group verbal, video, and participation provides a basic overview of AHA guidelines for community CPR. Role-play of emergencies allow participants the opportunity to practice calling for help and chest compression technique with discussion of AED use.   Hypertension: -Group verbal and written instruction that provides a basic overview of hypertension including the most recent diagnostic guidelines, risk factor reduction with self-care instructions and medication management.    Nutrition I class: Heart Healthy Eating:  -Group instruction provided by PowerPoint slides, verbal discussion, and written materials to support subject matter. The instructor gives an explanation and review of the Therapeutic Lifestyle Changes diet recommendations, which includes a discussion on lipid goals, dietary fat, sodium, fiber, plant stanol/sterol esters, sugar, and the components of a well-balanced, healthy diet.   Nutrition II class: Lifestyle Skills:  -Group instruction provided by PowerPoint slides, verbal discussion, and written materials to support subject matter. The instructor gives an explanation and review of label reading, grocery shopping for heart health, heart healthy recipe modifications, and ways to make healthier choices when eating out.   Diabetes Question & Answer:  -Group instruction provided by PowerPoint slides, verbal discussion, and written materials to support subject matter. The instructor gives an explanation  and review of diabetes co-morbidities, pre- and post-prandial blood glucose goals, pre-exercise blood glucose goals, signs, symptoms, and treatment of hypoglycemia and hyperglycemia, and foot care basics.   Diabetes Blitz:  -Group instruction provided by PowerPoint slides, verbal discussion, and written materials to support subject matter. The instructor gives an explanation and review of the physiology behind type 1 and type 2 diabetes, diabetes medications and rational behind using different medications, pre- and post-prandial blood glucose recommendations and Hemoglobin A1c goals, diabetes diet, and exercise including blood glucose guidelines for exercising safely.    Portion Distortion:  -Group instruction provided by PowerPoint slides, verbal discussion, written materials, and food models to support subject matter. The instructor gives an explanation of serving size versus portion size, changes in portions sizes over the last 20 years, and what consists of a serving from each food group.   Stress Management:  -Group instruction provided by verbal instruction, video, and written materials to support subject matter.  Instructors review role of stress in heart disease and how to cope with stress positively.     Exercising on Your Own:  -Group instruction provided by verbal instruction, power point, and written materials to support subject.  Instructors discuss benefits of exercise, components of exercise, frequency and intensity of exercise, and end points for exercise.  Also discuss use of nitroglycerin and activating EMS.  Review options of places to exercise outside of rehab.  Review guidelines for sex with heart disease.   CARDIAC REHAB PHASE II EXERCISE from 12/06/2016 in [facility]  Date  12/06/16  Educator  EP  Instruction Review Code (Retired)  2- meets goals/outcomes      Cardiac Drugs I:  -Group instruction provided by verbal instruction and written  materials to support subject.  Instructor reviews cardiac drug classes: antiplatelets, anticoagulants, beta blockers, and statins.  Instructor discusses reasons, side effects, and lifestyle considerations for each drug class.   Cardiac Drugs II:  -Group instruction provided by verbal instruction and written materials to support subject.  Instructor reviews cardiac drug classes: angiotensin converting enzyme inhibitors (ACE-I), angiotensin II receptor blockers (ARBs), nitrates, and calcium channel blockers.  Instructor discusses reasons, side effects, and lifestyle considerations for each drug class.   Anatomy and Physiology of the Circulatory System:  Group verbal and written instruction and models provide basic cardiac anatomy and physiology, with the coronary electrical and arterial systems. Review of: AMI, Angina, Valve disease, Heart Failure, Peripheral Artery Disease, Cardiac Arrhythmia, Pacemakers, and the ICD.   CARDIAC REHAB PHASE II EXERCISE from 12/06/2016 in [facility]  Date  11/01/16  Instruction Review Code (Retired)  2- meets goals/outcomes      Other Education:  -Group or individual verbal, written, or video instructions that support the educational goals of the cardiac rehab program.   Holiday Eating Survival Tips:  -Group instruction provided by PowerPoint slides, verbal discussion, and written materials to support subject matter. The instructor gives patients tips, tricks, and techniques to help them not only survive but enjoy the holidays despite the onslaught of food that accompanies the holidays.   Knowledge Questionnaire Score:   Core Components/Risk Factors/Patient Goals at Admission:   Core Components/Risk Factors/Patient Goals Review:  Goals and Risk Factor Review    Row Name 05/22/17 1633 06/21/17 1117           Core Components/Risk Factors/Patient Goals Review   Personal Goals Review  Weight  Management/Obesity;Hypertension;Diabetes;Lipids  Weight Management/Obesity;Hypertension;Diabetes;Lipids      Review  Elizabeth Park's vital signs weight and CBG's have been stable at cardiac rehab.  Elizabeth Park's vital signs weight and CBG's have been stable at cardiac rehab.      Expected Outcomes  encourage regular attendance to get the full benefit of partcipating in phase 2 cardiac rehab.  encourage regular attendance to get the full benefit of partcipating in phase 2 cardiac rehab.         Core Components/Risk Factors/Patient Goals at Discharge (Final Review):  Goals and Risk Factor Review - 06/21/17 1117      Core Components/Risk Factors/Patient Goals Review   Personal Goals Review  Weight Management/Obesity;Hypertension;Diabetes;Lipids    Review  Elizabeth Park's vital signs weight and CBG's have been stable at cardiac rehab.    Expected Outcomes  encourage regular attendance to get the full benefit of partcipating in phase 2 cardiac rehab.       ITP Comments: ITP Comments    Row Name 05/22/17 1632 06/21/17 1117         ITP Comments  30 day ITP Review. Elizabeth Park's attendance has been sporadic at cardiac rehab. Elizabeth Park has done well with exercise when she is able to attend.  30 day ITP Review. Elizabeth Park's attendance has been sporadic at cardiac rehab. Elizabeth Park has done well with exercise when she is able to attend.         Comments: See ITP comments.Barnet Pall, RN,BSN 06/21/2017 11:25 AM

## 2017-06-25 ENCOUNTER — Encounter (HOSPITAL_COMMUNITY)
Admission: RE | Admit: 2017-06-25 | Discharge: 2017-06-25 | Disposition: A | Discharge status: Home / Self Care | Payer: Medicare Other | Source: Ambulatory Visit | Attending: Cardiovascular Disease | Admitting: Cardiovascular Disease

## 2017-06-25 DIAGNOSIS — Z48812 Encounter for surgical aftercare following surgery on the circulatory system: Secondary | ICD-10-CM | POA: Diagnosis not present

## 2017-06-25 DIAGNOSIS — Z955 Presence of coronary angioplasty implant and graft: Secondary | ICD-10-CM | POA: Diagnosis not present

## 2017-06-27 ENCOUNTER — Encounter (HOSPITAL_COMMUNITY)
Admission: RE | Admit: 2017-06-27 | Discharge: 2017-06-27 | Disposition: A | Discharge status: Home / Self Care | Payer: Medicare Other | Source: Ambulatory Visit | Attending: Cardiovascular Disease | Admitting: Cardiovascular Disease

## 2017-06-27 DIAGNOSIS — Z48812 Encounter for surgical aftercare following surgery on the circulatory system: Secondary | ICD-10-CM | POA: Diagnosis not present

## 2017-06-27 DIAGNOSIS — Z955 Presence of coronary angioplasty implant and graft: Secondary | ICD-10-CM | POA: Diagnosis not present

## 2017-07-04 ENCOUNTER — Encounter (HOSPITAL_COMMUNITY)
Admission: RE | Admit: 2017-07-04 | Discharge: 2017-07-04 | Disposition: A | Discharge status: Home / Self Care | Payer: Medicare Other | Source: Ambulatory Visit | Attending: Cardiovascular Disease | Admitting: Cardiovascular Disease

## 2017-07-04 VITALS — BP 102/70 | HR 71 | Ht 64.5 in | Wt 207.7 lb

## 2017-07-04 DIAGNOSIS — Z48812 Encounter for surgical aftercare following surgery on the circulatory system: Secondary | ICD-10-CM | POA: Insufficient documentation

## 2017-07-04 DIAGNOSIS — Z955 Presence of coronary angioplasty implant and graft: Secondary | ICD-10-CM | POA: Insufficient documentation

## 2017-07-04 NOTE — Progress Notes (Signed)
Discharge Progress Report  Patient Details  Name: Elizabeth Park MRN: [id number] Date of Birth: [birth year] Referring Provider:     [place] from 04/10/2017 in [place]  Referring Provider  Elizabeth Royals MD       Number of Visits: 25  Reason for Discharge:  Patient reached a stable level of exercise.  Smoking History:  Social History   Tobacco Use  Smoking Status Former Smoker  . Packs/day: 1.00  . Years: 28.00  . Pack years: 28.00  . Types: Cigarettes  . Last attempt to quit: 03/31/2009  . Years since quitting: 8.3  Smokeless Tobacco Never Used    Diagnosis:  Status post coronary artery stent placement DES Intermediate Ramus 02/07/17  ADL UCSD:   Initial Exercise Prescription: Initial Exercise Prescription - 04/10/17 1000      Date of Initial Exercise RX and Referring Provider   Date  04/10/17    Referring Provider  Elizabeth Royals MD      Treadmill   MPH  2.6    Grade  0    Minutes  10    METs  2.99      Bike   Level  0.8    Minutes  10    METs  2.59      NuStep   Level  3    SPM  80    Minutes  10    METs  2      Prescription Details   Frequency (times per week)  3    Duration  Progress to 30 minutes of continuous aerobic without signs/symptoms of physical distress      Intensity   THRR 40-80% of Max Heartrate  69-138    Ratings of Perceived Exertion  11-15    Perceived Dyspnea  0-4      Progression   Progression  Continue to progress workloads to maintain intensity without signs/symptoms of physical distress.      Resistance Training   Training Prescription  Yes    Weight  3lbs    Reps  10-15       Discharge Exercise Prescription (Final Exercise Prescription Changes): Exercise Prescription Changes - 07/04/17 0953      Response to Exercise   Blood Pressure (Admit)  102/70    Blood Pressure (Exercise)  108/72    Blood Pressure (Exit)  100/72    Heart Rate (Admit)   71 bpm    Heart Rate (Exercise)  101 bpm    Heart Rate (Exit)  71 bpm    Rating of Perceived Exertion (Exercise)  12    Symptoms  none    Duration  Continue with 30 min of aerobic exercise without signs/symptoms of physical distress.    Intensity  THRR unchanged      Progression   Progression  Continue to progress workloads to maintain intensity without signs/symptoms of physical distress.    Average METs  2.9      Resistance Training   Training Prescription  No      Interval Training   Interval Training  No      Treadmill   MPH  2.6    Grade  0    Minutes  0    METs  2.99      Bike   Level  1    Minutes  10    METs  3.01      NuStep   Level  5  SPM  85    Minutes  10    METs  2.7      Track   Laps  --    Minutes  --    METs  --      Home Exercise Plan   Plans to continue exercise at  Home (comment)    Frequency  Add 4 additional days to program exercise sessions.    Initial Home Exercises Provided  05/04/17       Functional Capacity: 6 Minute Walk    Row Name 04/10/17 1002 04/10/17 1043 06/25/17 1016     6 Minute Walk   Phase  Initial  -  Discharge   Distance  -  1466 feet  1685 feet   Distance % Change  -  -  14.94 %   Walk Time  6 minutes  -  6 minutes   # of Rest Breaks  0  -  0   MPH  -  2.78  3.19   METS  -  3.78  4.24   RPE  _0 VO2 Peak  -  13.2  14.84   Symptoms  No  No  No   Resting HR  65 bpm  -  70 bpm   Resting BP  94/62  -  118/70   Resting Oxygen Saturation   99 %  -  -   Exercise Oxygen Saturation  during 6 min walk  100 %  -  99 %   Max Ex. HR  109 bpm  -  92 bpm   Max Ex. BP  98/70  -  130/80   2 Minute Post BP  102/70  -  100/66      Psychological, QOL, Others - Outcomes: PHQ 2/9: Depression screen [facility] 2/9 07/04/2017 07/04/2017 04/25/2017 04/10/2017 11/01/2016  Decreased Interest 0 0 0 0 0  Down, Depressed, Hopeless 0 0 0 0 0  PHQ - 2 Score 0 0 0 0 0    Quality of Life: Quality of Life - 07/04/17 1209      Quality  of Life Scores   Health/Function Pre  24 %    Health/Function Post  28 %    Health/Function % Change  16.67 %    Socioeconomic Pre  27 %    Socioeconomic Post  27.75 %    Socioeconomic % Change   2.78 %    Psych/Spiritual Pre  18.4 %    Psych/Spiritual Post  30 %    Psych/Spiritual % Change  63.04 %    Family Pre  27.6 %    Family Post  30 %    Family % Change  8.7 %    GLOBAL Pre  24.09 %    GLOBAL Post  28.63 %    GLOBAL % Change  18.85 %       Personal Goals: Goals established at orientation with interventions provided to work toward goal. Personal Goals and Risk Factors at Admission - 04/10/17 1048      Core Components/Risk Factors/Patient Goals on Admission   Goal Weight: Short Term  200 lb (90.7 kg)    Goal Weight: Long Term  190 lb (86.2 kg)        Personal Goals Discharge: Goals and Risk Factor Review    Row Name 02/02/17 1156 02/28/17 0959 05/22/17 1633 06/21/17 1117       Core Components/Risk Factors/Patient Goals Review   Personal Goals Review  Diabetes;Hypertension;Lipids  Weight Management/Obesity  Weight Management/Obesity;Hypertension;Diabetes;Lipids  Weight Management/Obesity;Hypertension;Diabetes;Lipids    Review  pt with multiple CAD RF displays eagerness to participate in CR exercise, nutrition and lifestyle education. pt blood sugar normalizing with current treatment.  pt BP WNL. pt notes increased stamina with household activities.  Pt has lost 6.6 lb. Minimum wt loss goal met.   Elizabeth Park's vital signs weight and CBG's have been stable at cardiac rehab.  Elizabeth Park's vital signs weight and CBG's have been stable at cardiac rehab.    Expected Outcomes  pt will participate in CR exercise,nutrition, and lifestyle education opportunities.    Continue to encourage slow wt loss of 1-2 lb/week to wt loss goal wt of 175 lb.   encourage regular attendance to get the full benefit of partcipating in phase 2 cardiac rehab.  encourage regular attendance to get the full benefit of  partcipating in phase 2 cardiac rehab.       Exercise Goals and Review: Exercise Goals    Row Name 04/10/17 1004             Exercise Goals   Increase Physical Activity  Yes       Intervention  Provide advice, education, support and counseling about physical activity/exercise needs.       Increase Strength and Stamina  Yes       Intervention  Provide advice, education, support and counseling about physical activity/exercise needs.;Develop an individualized exercise prescription for aerobic and resistive training based on initial evaluation findings, risk stratification, comorbidities and participant's personal goals.       Expected Outcomes  Achievement of increased cardiorespiratory fitness and enhanced flexibility, muscular endurance and strength shown through measurements of functional capacity and personal statement of participant.       Able to understand and use rate of perceived exertion (RPE) scale  Yes       Intervention  Provide education and explanation on how to use RPE scale       Expected Outcomes  Short Term: Able to use RPE daily in rehab to express subjective intensity level;Long Term:  Able to use RPE to guide intensity level when exercising independently       Knowledge and understanding of Target Heart Rate Range (THRR)  Yes       Intervention  Provide education and explanation of THRR including how the numbers were predicted and where they are located for reference       Expected Outcomes  Short Term: Able to state/look up THRR;Long Term: Able to use THRR to govern intensity when exercising independently;Short Term: Able to use daily as guideline for intensity in rehab       Able to check pulse independently  Yes       Intervention  Provide education and demonstration on how to check pulse in carotid and radial arteries.;Review the importance of being able to check your own pulse for safety during independent exercise       Expected Outcomes  Short Term: Able to explain  why pulse checking is important during independent exercise;Long Term: Able to check pulse independently and accurately       Understanding of Exercise Prescription  Yes       Intervention  Provide education, explanation, and written materials on patient's individual exercise prescription       Expected Outcomes  Short Term: Able to explain program exercise prescription;Long Term: Able to explain home exercise prescription to exercise independently  Nutrition & Weight - Outcomes: Pre Biometrics - 04/10/17 1145      Pre Biometrics   Height  5' 4.5" (1.638 m)    Weight  205 lb 7.5 oz (93.2 kg)    Waist Circumference  45 inches    Hip Circumference  44.5 inches    Waist to Hip Ratio  1.01 %    BMI (Calculated)  34.74    Triceps Skinfold  21 mm    % Body Fat  43.2 %    Grip Strength  36 kg    Flexibility  10.5 in    Single Leg Stand  18 seconds      Post Biometrics - 07/04/17 0953       Post  Biometrics   Height  5' 4.5" (1.638 m)    Weight  207 lb 10.8 oz (94.2 kg)    Waist Circumference  44.5 inches    Hip Circumference  45 inches    Waist to Hip Ratio  0.99 %    BMI (Calculated)  35.11    Triceps Skinfold  20 mm    % Body Fat  43 %    Grip Strength  36.5 kg    Flexibility  11 in    Single Leg Stand  24 seconds       Nutrition: Nutrition Therapy & Goals - 04/10/17 1324      Nutrition Therapy   Diet  Carb Modified, Heart Healthy      Personal Nutrition Goals   Nutrition Goal  Pt to identify food quantities necessary to achieve weight loss of 6-24 lb (2.7-10.9 kg) at graduation from cardiac rehab. Goal wt of 180 lb desired.       Intervention Plan   Intervention  Prescribe, educate and counsel regarding individualized specific dietary modifications aiming towards targeted core components such as weight, hypertension, lipid management, diabetes, heart failure and other comorbidities.    Expected Outcomes  Short Term Goal: Understand basic principles of dietary  content, such as calories, fat, sodium, cholesterol and nutrients.;Long Term Goal: Adherence to prescribed nutrition plan.       Nutrition Discharge: Nutrition Assessments - 07/06/17 0917      MEDFICTS Scores   Pre Score  51    Post Score  0    Score Difference  -51       Education Questionnaire Score: Knowledge Questionnaire Score - 07/04/17 0953      Knowledge Questionnaire Score   Pre Score  23/24    Post Score  21/24       Goals reviewed with patient; copy given to patient.Faithlyn graduated from cardiac rehab program today with completion of 25 exercise sessions in Phase II. Itati's attendance was sporadic during  her participation in cardiac  Rehab. Dilpreet did enjoy coming to exercise.when she came to class. Emmelia's vital signs and CBG's were stable at cardiac rehab. Medication list reconciled. Repeat  PHQ score- 0 .  Pt has made significant lifestyle changes and should be commended for her success. Pt feels she has achieved her goals during cardiac rehab.   Giorgia increased her distance on her post exercise walk test and gained 2 pounds. Pt plans to continue exercise at [employer].Barnet Pall, RN,BSN 07/17/2017 9:36 AM

## 2017-07-10 DIAGNOSIS — M79642 Pain in left hand: Secondary | ICD-10-CM | POA: Diagnosis not present

## 2017-07-12 ENCOUNTER — Telehealth: Payer: Self-pay | Admitting: Cardiovascular Disease

## 2017-07-12 NOTE — Telephone Encounter (Signed)
New message    Pt c/o of Chest Pain: STAT if CP now or developed within 24 hours  1. Are you having CP right now? NO  2. Are you experiencing any other symptoms (ex. SOB, nausea, vomiting, sweating)? NO  3. How long have you been experiencing CP? 5 DAYS  4. Is your CP continuous or coming and going? COMING AND GOING 5. Have you taken Nitroglycerin? NO ?

## 2017-07-12 NOTE — Telephone Encounter (Signed)
Sunday got multiple kinds of bad news. Brother had heart attack. Cousin stage 4 cancer and 2 friends with new cancer. Got upset last night hearing cousin  admitted to hospice.  Pt was crying, upset, got really bad chest pain. Took an aspirin, eased up.  Pain comes and goes, never consistent. Sunday had burning sensation in shoulder.  Went away after took nexium.  Was thinking it was GI related.  CP is "not that painful".  Achy feeling.left sided on front of shoulder.   Does not go into arm.  No tingling.  No numbness.  No nausea. No sweating.  Daily walk  No SOB. Has not felt she needed ntg at any point. Advised patient that it is ok to to go to ER at anytime for her current symptoms.  Stressed importance of going to ER for any change/worsening symptoms, given her cardiac history.  She only wanted to call because "everybody told her to".  Pt is aware I will route to Dr. Angelena Form and his nurse and we will call her back with recommendations.

## 2017-07-13 NOTE — Telephone Encounter (Signed)
I spoke with pt and gave her information from Dr. Angelena Form. She is feeling fine today. Thinks problems were related to stress.

## 2017-07-13 NOTE — Telephone Encounter (Signed)
Agree that she should go to the ED if pain worsens. Use NTG for mild pain. cdm

## 2017-07-23 DIAGNOSIS — R2232 Localized swelling, mass and lump, left upper limb: Secondary | ICD-10-CM | POA: Diagnosis not present

## 2017-07-25 DIAGNOSIS — E669 Obesity, unspecified: Secondary | ICD-10-CM | POA: Diagnosis not present

## 2017-07-25 DIAGNOSIS — E782 Mixed hyperlipidemia: Secondary | ICD-10-CM | POA: Diagnosis not present

## 2017-07-25 DIAGNOSIS — E0842 Diabetes mellitus due to underlying condition with diabetic polyneuropathy: Secondary | ICD-10-CM | POA: Diagnosis not present

## 2017-07-25 DIAGNOSIS — I110 Hypertensive heart disease with heart failure: Secondary | ICD-10-CM | POA: Diagnosis not present

## 2017-07-25 DIAGNOSIS — G894 Chronic pain syndrome: Secondary | ICD-10-CM | POA: Diagnosis not present

## 2017-07-25 DIAGNOSIS — M79662 Pain in left lower leg: Secondary | ICD-10-CM | POA: Diagnosis not present

## 2017-07-25 DIAGNOSIS — L308 Other specified dermatitis: Secondary | ICD-10-CM | POA: Diagnosis not present

## 2017-07-25 DIAGNOSIS — Z6832 Body mass index (BMI) 32.0-32.9, adult: Secondary | ICD-10-CM | POA: Diagnosis not present

## 2017-07-25 DIAGNOSIS — M79661 Pain in right lower leg: Secondary | ICD-10-CM | POA: Diagnosis not present

## 2017-07-25 DIAGNOSIS — M545 Low back pain: Secondary | ICD-10-CM | POA: Diagnosis not present

## 2017-07-25 DIAGNOSIS — B373 Candidiasis of vulva and vagina: Secondary | ICD-10-CM | POA: Diagnosis not present

## 2017-07-26 ENCOUNTER — Telehealth: Payer: Self-pay

## 2017-07-26 NOTE — Telephone Encounter (Signed)
Reached out to Jeronimo Norma, Surgery Scheduler for [employer].  Left her a message to call back so we can find out if the surgery can be done continuing Aspirin and Plavix. Left a message for her to call the preop call back pool.

## 2017-07-26 NOTE — Telephone Encounter (Signed)
   Primary Cardiologist:Christopher Angelena Form, MD  Chart reviewed as part of pre-operative protocol coverage. Pt has history of CAD s/p 4VCABG 2011 and mitral valve repair for severe mitral regurgitation, DESx3 in 09/2016, DES to ramus intermediate in 02/2017, DM, HTN, HLD, former tobacco abuse, chronic diastolic CHF. At time of her last coronary stenting in 02/2017 it was recommended to continue uninterrupted dual antiplatelet therapy for at least 1 year (through 02/2018), and even at that point her primary cardiologist will need to clear her to come off of this. Furthermore, per telephone note this month, patient had another episode of chest discomfort. She only called as a precaution - she thought was due to stress. She was advised to take SL NTG for mild chest pain and go to ER if pain recurred. Follow-up was scheduled for 08/09/17 - she will need to be seen before clearing for any surgery.  Will route to callback staff: - please call surgeon's office - if surgery cannot be done while continuing both aspirin and Plavix, then it needs to be deferred until after 02/2018 or alternative methods need to be investigated. - if it can be done on dual antiplatelet therapy, patient still needs to be seen in the office prior to clearing - please let her know.  Will also cc to Dr. Angelena Form to make him aware.  Charlie Pitter, PA-C 07/26/2017, 4:20 PM

## 2017-07-26 NOTE — Telephone Encounter (Signed)
   [facility] Pre-operative Risk Assessment    Request for surgical clearance:  1. What type of surgery is being performed? Left Hand: Left hand mass excision    2. When is this surgery scheduled? 08/08/17   3. What type of clearance is required (medical clearance vs. Pharmacy clearance to hold med vs. Both)? both  4. Are there any medications that need to be held prior to surgery and how long? Plavix, not listed    5. Practice name and name of physician performing surgery? [facility]   6. What is your office phone number [phone]    7.   What is your office fax number [phone] Santiago Bur)  8.   Anesthesia type (None, local, MAC, general) ? General    Joaquim Lai 07/26/2017, 1:36 PM  _________________________________________________________________   (provider comments below)

## 2017-07-27 NOTE — Telephone Encounter (Signed)
Elizabeth Park called Dr. Lequita Asal office and specifically mentioned both aspirin and plavix, per Dr. Lequita Asal office, patient ok to proceed with surgery on aspirin and plavix. Patient has followup with Melina Copa 08/09/2017 for final clearance.

## 2017-07-27 NOTE — Telephone Encounter (Signed)
Thanks

## 2017-07-27 NOTE — Telephone Encounter (Signed)
Still waiting on decision from orthopedic surgery regarding whether can proceed with surgery on the aspirin and plavix, patient is high risk for in stent stenosis and need aspirin and plavix for at least 1 year after her stent placement in Nov 2018.

## 2017-07-27 NOTE — Telephone Encounter (Signed)
Per Dr Caralyn Guile, okay to stay on medications.

## 2017-08-07 ENCOUNTER — Encounter: Payer: Self-pay | Admitting: Physician Assistant

## 2017-08-08 ENCOUNTER — Other Ambulatory Visit: Payer: Self-pay

## 2017-08-08 DIAGNOSIS — D1722 Benign lipomatous neoplasm of skin and subcutaneous tissue of left arm: Secondary | ICD-10-CM | POA: Diagnosis not present

## 2017-08-08 DIAGNOSIS — R2232 Localized swelling, mass and lump, left upper limb: Secondary | ICD-10-CM | POA: Diagnosis not present

## 2017-08-09 ENCOUNTER — Ambulatory Visit: Payer: Self-pay | Admitting: Physician Assistant

## 2017-08-22 DIAGNOSIS — M545 Low back pain: Secondary | ICD-10-CM | POA: Diagnosis not present

## 2017-08-22 DIAGNOSIS — M79661 Pain in right lower leg: Secondary | ICD-10-CM | POA: Diagnosis not present

## 2017-08-22 DIAGNOSIS — G894 Chronic pain syndrome: Secondary | ICD-10-CM | POA: Diagnosis not present

## 2017-08-22 DIAGNOSIS — M79662 Pain in left lower leg: Secondary | ICD-10-CM | POA: Diagnosis not present

## 2017-09-09 ENCOUNTER — Other Ambulatory Visit: Payer: Self-pay | Admitting: Physician Assistant

## 2017-09-24 ENCOUNTER — Encounter: Payer: Self-pay | Admitting: Cardiovascular Disease

## 2017-09-24 ENCOUNTER — Ambulatory Visit (INDEPENDENT_AMBULATORY_CARE_PROVIDER_SITE_OTHER): Payer: Medicare Other | Admitting: Cardiovascular Disease

## 2017-09-24 ENCOUNTER — Encounter (INDEPENDENT_AMBULATORY_CARE_PROVIDER_SITE_OTHER): Payer: Self-pay

## 2017-09-24 VITALS — BP 124/76 | HR 65 | Ht 64.5 in | Wt 207.8 lb

## 2017-09-24 DIAGNOSIS — F5101 Primary insomnia: Secondary | ICD-10-CM

## 2017-09-24 DIAGNOSIS — I10 Essential (primary) hypertension: Secondary | ICD-10-CM | POA: Diagnosis not present

## 2017-09-24 DIAGNOSIS — Z9889 Other specified postprocedural states: Secondary | ICD-10-CM

## 2017-09-24 DIAGNOSIS — E7800 Pure hypercholesterolemia, unspecified: Secondary | ICD-10-CM

## 2017-09-24 DIAGNOSIS — I25810 Atherosclerosis of coronary artery bypass graft(s) without angina pectoris: Secondary | ICD-10-CM

## 2017-09-24 DIAGNOSIS — I5032 Chronic diastolic (congestive) heart failure: Secondary | ICD-10-CM | POA: Diagnosis not present

## 2017-09-24 NOTE — Progress Notes (Signed)
Chief Complaint  Patient presents with  . Follow-up    CAD     History of Present Illness: 47 yo female with history of CAD s/p 4VCABG 2011, mitral regurgitation s/p repair, chronic diastolic CHF, DM, HTN, HLD who is here today for cardiac follow up. Cardiac cath  In December 2010 showed severe proximal LAD stenosis, severe mid intermediate disease with occluded mid RCA and anomalous Circumflex with severe disease. She also had severe mitral regurgitation. She underwent a 4V CABG  (LIMA to LAD, SVG to subbranch of ramus intermediate, SVG to lateral subbranch of ramus intermediate, SVG to distal RCA) and mitral valve repair January 2011. Last echo September 2016 with normal LV systolic function, stable mitral valve repair. She had chest pain in June 2018 and her nuclear stress test showed ischemia. Cardiac cath on 09/22/16 showed progression of her CAD. LV function was normal. I placed a drug eluting stent in the vein graft to the PDA, a drug eluting stent in the native Ramus intermediate branch and a drug eluting stent in the protected left main. Repeat cardiac cath 02/07/17 and found to have continued occlusion of all three native vessels with patent LIMA to LAD, occluded SVG to ramus x 2, patent SVG to PDA but severe restenosis in the ramus intermediate stent. This was treated with another drug eluting stent. Her BP was soft in the hospital so Norvasc was stopped.  Coreg dose lowered at office visit in February 2019 due to fatigue.   She is here today for follow up. The patient denies any chest pain, dyspnea, palpitations, lower extremity edema, orthopnea, PND, dizziness, near syncope or syncope.   Primary Care Physician: Lucianne Lei, MD  Past Medical History:  Diagnosis Date  . Chronic diastolic CHF (congestive heart failure) ([place])   . Coronary artery disease    a. 4v CABG  (LIMA to LAD, SVG to subbranch of ramus intermediate, SVG to lateral subbranch of ramus intermediate, SVG to distal RCA)  and mitral valve repair January 2011. b. Abnormal nuc 09/2016 s/p  drug eluting stent to the vein graft to the PDA, a drug eluting stent in the native Ramus intermediate branch and a drug eluting stent in the protected left main. c. DES to ramus intermediate 02/2017.  . Diabetes mellitus   . H/O emotional problems   . Headache, migraine   . High cholesterol   . History of blood transfusion   . Hypertension   . MI, old   . MRSA (methicillin resistant Staphylococcus aureus)   . MVP (mitral valve prolapse)   . Neuromuscular disorder ([place])   . Neuropathy   . S/P mitral valve repair   . Sebaceous cyst     Past Surgical History:  Procedure Laterality Date  . ABDOMINAL HYSTERECTOMY    . CARDIAC CATHETERIZATION  09/22/2016  . CORONARY ARTERY BYPASS GRAFT  with valve repair  . CORONARY STENT INTERVENTION N/A 09/22/2016   Procedure: Coronary Stent Intervention;  Surgeon: Burnell Blanks, MD;  Location: [facility];  Service: Cardiovascular;  Laterality: N/A;  . CORONARY STENT INTERVENTION N/A 02/07/2017   Procedure: CORONARY STENT INTERVENTION;  Surgeon: Martinique, Peter M, MD;  Location: [place] [facility];  Service: Cardiovascular;  Laterality: N/A;  . CORONARY STENT PLACEMENT  09/22/2016  . LEFT HEART CATH AND CORS/GRAFTS ANGIOGRAPHY N/A 09/22/2016   Procedure: Left Heart Cath and Cors/Grafts Angiography;  Surgeon: Burnell Blanks, MD;  Location: [facility];  Service: Cardiovascular;  Laterality: N/A;  . LEFT HEART CATH AND CORS/GRAFTS ANGIOGRAPHY N/A 02/07/2017   Procedure: LEFT HEART CATH AND CORS/GRAFTS ANGIOGRAPHY;  Surgeon: Martinique, Peter M, MD;  Location: [facility];  Service: Cardiovascular;  Laterality: N/A;  . SKIN GRAFT      Current Outpatient Medications  Medication Sig Dispense Refill  . aspirin 81 MG tablet Take 81 mg by mouth daily.     . clopidogrel (PLAVIX) 75 MG tablet Take 1 tablet (75 mg total) by mouth daily with breakfast. 90 tablet 4    . fluticasone (FLONASE) 50 MCG/ACT nasal spray Place 1 spray into both nostrils 2 (two) times daily.     . furosemide (LASIX) 20 MG tablet TAKE ONE TABLET BY MOUTH DAILY AS NEEDED FOR SWELLING 30 tablet 9  . insulin aspart (NOVOLOG FLEXPEN) 100 UNIT/ML injection Inject 0-12 Units into the skin See admin instructions. Per sliding scale Under 150 no units 150-199 take 8 units 200-249 take 10 units 250-299 take 12 units    . lisinopril (PRINIVIL,ZESTRIL) 5 MG tablet TAKE 1 TABLET (5 MG TOTAL) DAILY BY MOUTH. 90 tablet 2  . LYRICA CR 165 MG TB24 Take 165 mg daily by mouth.     . montelukast (SINGULAIR) 10 MG tablet Take 10 mg by mouth at bedtime.    . Naloxone HCl (EVZIO) 0.4 MG/0.4ML SOAJ Inject 0.4 mg as directed as needed (for possible overdose).     . nitroGLYCERIN (NITROSTAT) 0.4 MG SL tablet Place 1 tablet (0.4 mg total) under the tongue every 5 (five) minutes as needed for chest pain. 25 tablet 6  . oxyCODONE-acetaminophen (PERCOCET) 10-325 MG per tablet Take 1 tablet by mouth 3 (three) times daily.    . rosuvastatin (CRESTOR) 40 MG tablet TAKE 1 TABLET (40 MG TOTAL) EVERY EVENING BY MOUTH. 90 tablet 2  . Semaglutide 1 MG/DOSE SOPN Inject 1 mg once a week into the skin.    . SitaGLIPtin-MetFORMIN HCl (JANUMET XR) 50-1000 MG TB24 Take 1 tablet by mouth 2 (two) times daily.    . TRESIBA FLEXTOUCH 100 UNIT/ML SOPN FlexTouch Pen Inject 40 Units as directed at bedtime.   4  . zolpidem (AMBIEN) 5 MG tablet Take 1 tablet (5 mg total) by mouth at bedtime as needed for sleep. 30 tablet 3  . carvedilol (COREG) 3.125 MG tablet Take 1 tablet (3.125 mg total) by mouth 2 (two) times daily. 180 tablet 3   No current facility-administered medications for this visit.     Allergies  Allergen Reactions  . Metformin Diarrhea and Nausea And Vomiting    Immediate release tablets (not metformin ER, pt currently taking)    Social History   Socioeconomic History  . Marital status: Divorced    Spouse  name: Not on file  . Number of children: 2  . Years of education: Not on file  . Highest education level: Not on file  Occupational History  . Occupation: Disability  Social Needs  . Financial resource strain: Not on file  . Food insecurity:    Worry: Not on file    Inability: Not on file  . Transportation needs:    Medical: No    Non-medical: No  Tobacco Use  . Smoking status: Former Smoker    Packs/day: 1.00    Years: 28.00    Pack years: 28.00    Types: Cigarettes    Last attempt to quit: 03/31/2009    Years since quitting: 8.4  . Smokeless tobacco: Never Used  Substance and Sexual Activity  . Alcohol use: No  . Drug use: No  . Sexual activity: Yes    Birth control/protection: Surgical    Comment: partial hysterectomy  Lifestyle  . Physical activity:    Days per week: 0 days    Minutes per session: 0 min  . Stress: Only a little  Relationships  . Social connections:    Talks on phone: Not on file    Gets together: Not on file    Attends religious service: Not on file    Active member of club or organization: Not on file    Attends meetings of clubs or organizations: Not on file    Relationship status: Not on file  . Intimate partner violence:    Fear of current or ex partner: Not on file    Emotionally abused: Not on file    Physically abused: Not on file    Forced sexual activity: Not on file  Other Topics Concern  . Not on file  Social History Narrative  . Not on file    Family History  Problem Relation Age of Onset  . Heart disease Father   . Asthma Father   . Hypertension Father   . Diabetes Father   . Arthritis Father   . Heart disease Mother   . Hypertension Mother   . Diabetes Mother   . Migraines Mother   . Stroke Mother   . Emphysema Maternal Grandmother   . Hypertension Brother   . Diabetes Brother     Review of Systems:  As stated in the HPI and otherwise negative.   BP 124/76   Pulse 65   Ht 5' 4.5" (1.638 m)   Wt 207 lb 12.8  oz (94.3 kg)   SpO2 99%   BMI 35.12 kg/m   Physical Examination:  General: Well developed, well nourished, NAD  HEENT: OP clear, mucus membranes moist  SKIN: warm, dry. No rashes. Neuro: No focal deficits  Musculoskeletal: Muscle strength 5/5 all ext  Psychiatric: Mood and affect normal  Neck: No JVD, no carotid bruits, no thyromegaly, no lymphadenopathy.  Lungs:Clear bilaterally, no wheezes, rhonci, crackles Cardiovascular: Regular rate and rhythm. No murmurs, gallops or rubs. Abdomen:Soft. Bowel sounds present. Non-tender.  Extremities: No lower extremity edema. Pulses are 2 + in the bilateral DP/PT.  Echo September 2016: Left ventricle: The cavity size was normal. Wall thickness was   normal. Systolic function was normal. The estimated ejection   fraction was in the range of 55% to 60%. Wall motion was normal;   there were no regional wall motion abnormalities. Doppler   parameters are consistent with abnormal left ventricular   relaxation (grade 1 diastolic dysfunction). - Aortic valve: There was no stenosis. - Mitral valve: Status post mitral valve repair. Probably mild   mitral stenosis. There was no significant regurgitation. Mean   gradient (D): 6 mm Hg. Valve area by pressure half-time: 1.3   cm^2. - Right ventricle: The cavity size was normal. Systolic function   was mildly reduced. - Pulmonary arteries: No complete TR doppler jet so unable to   estimate PA systolic pressure. - Inferior vena cava: The vessel was normal in size. The   respirophasic diameter changes were in the normal range (>= 50%),   consistent with normal central venous pressure.  EKG:  EKG is ordered today. The ekg ordered today demonstrates NSR, rate 65 bpm.   Recent Labs: 02/08/2017: BUN 9; Creatinine, Ser 0.69; Hemoglobin 11.4; Platelets 256;  Potassium 3.5; Sodium 137 04/12/2017: ALT 16   Lipid Panel  Lipid Panel     Component Value Date/Time   CHOL 126 04/12/2017 0842   TRIG 164 (H)  04/12/2017 0842   HDL 32 (L) 04/12/2017 0842   CHOLHDL 3.9 04/12/2017 0842   CHOLHDL 8.9 04/03/2009 0320   VLDL 54 (H) 04/03/2009 0320   LDLCALC 61 04/12/2017 0842    Wt Readings from Last 3 Encounters:  09/24/17 207 lb 12.8 oz (94.3 kg)  07/04/17 207 lb 10.8 oz (94.2 kg)  05/16/17 208 lb 9.6 oz (94.6 kg)     Other studies Reviewed: Additional studies/ records that were reviewed today include: . Review of the above records demonstrates:    Assessment and Plan:   1. CAD without angina: No chest pain. Continue ASA and Plavix for lifetime given her many stents. Continue statin and beta blocker.   2. HYPERLIPIDEMIA: Lipids controlled. Continue statin.   3. HYPERTENSION: BP is well controlled.   4. TOBACCO ABUSE, in remission: She stopped smoking in 2010.   5. Mitral regurgitation: Echo in 2016 showed that the mitral valve repair was stable.   6. Chronic diastolic CHF: Her volume status is stable. Continue Lasix as needed  7. Leg pain: ABI normal November 2018  8. Insomnia: Continue Ambien  Current medicines are reviewed at length with the patient today.  The patient does not have concerns regarding medicines.  The following changes have been made:  no change  Labs/ tests ordered today include:   Orders Placed This Encounter  Procedures  . EKG 12-Lead    Disposition:   FU with me in 6  months  Signed, Lauree Chandler, MD 09/24/2017 10:23 AM    [facility] [address], [place], [place]  [address] Phone: [phone]; Fax: [phone]

## 2017-09-24 NOTE — Patient Instructions (Addendum)
Medication Instructions:  Your physician recommends that you continue on your current medications as directed. Please refer to the Current Medication list given to you today.   Labwork: none  Testing/Procedures: none  Follow-Up: You have a 6 month follow up appointment scheduled for 03/18/18 at 10:20 AM  Any Other Special Instructions Will Be Listed Below (If Applicable).     If you need a refill on your cardiac medications before your next appointment, please call your pharmacy.

## 2017-09-25 ENCOUNTER — Other Ambulatory Visit: Payer: Self-pay | Admitting: Cardiovascular Disease

## 2017-09-25 NOTE — Telephone Encounter (Signed)
Pt's pharmacy is requesting a refill on zolpidem. Would Dr. Mcalhany like to refill this medication? Please address 

## 2017-09-26 DIAGNOSIS — G894 Chronic pain syndrome: Secondary | ICD-10-CM | POA: Diagnosis not present

## 2017-09-26 DIAGNOSIS — M79662 Pain in left lower leg: Secondary | ICD-10-CM | POA: Diagnosis not present

## 2017-09-26 DIAGNOSIS — M545 Low back pain: Secondary | ICD-10-CM | POA: Diagnosis not present

## 2017-09-26 DIAGNOSIS — M79661 Pain in right lower leg: Secondary | ICD-10-CM | POA: Diagnosis not present

## 2017-09-27 NOTE — Telephone Encounter (Signed)
Prescription called to pharmacy.

## 2017-09-27 NOTE — Telephone Encounter (Signed)
OK to refill

## 2017-09-27 NOTE — Telephone Encounter (Signed)
Please specify how much zolpidem to dispense and how many refills. Dr. Angelena Form would like to prescribed.

## 2017-09-27 NOTE — Telephone Encounter (Signed)
This medication has to be faxed in or phone in, it will not let me e-scribe. Please address

## 2017-09-27 NOTE — Telephone Encounter (Signed)
30 tablets with 3 refills

## 2017-09-28 DIAGNOSIS — E1140 Type 2 diabetes mellitus with diabetic neuropathy, unspecified: Secondary | ICD-10-CM | POA: Diagnosis not present

## 2017-09-28 DIAGNOSIS — I5032 Chronic diastolic (congestive) heart failure: Secondary | ICD-10-CM | POA: Diagnosis not present

## 2017-09-28 DIAGNOSIS — E7800 Pure hypercholesterolemia, unspecified: Secondary | ICD-10-CM | POA: Diagnosis not present

## 2017-09-28 DIAGNOSIS — F5101 Primary insomnia: Secondary | ICD-10-CM | POA: Diagnosis not present

## 2017-09-28 DIAGNOSIS — I10 Essential (primary) hypertension: Secondary | ICD-10-CM | POA: Diagnosis not present

## 2017-09-28 DIAGNOSIS — E1100 Type 2 diabetes mellitus with hyperosmolarity without nonketotic hyperglycemic-hyperosmolar coma (NKHHC): Secondary | ICD-10-CM | POA: Diagnosis not present

## 2017-09-28 DIAGNOSIS — I25810 Atherosclerosis of coronary artery bypass graft(s) without angina pectoris: Secondary | ICD-10-CM | POA: Diagnosis not present

## 2017-09-28 DIAGNOSIS — Z79899 Other long term (current) drug therapy: Secondary | ICD-10-CM | POA: Diagnosis not present

## 2017-09-28 DIAGNOSIS — Z9889 Other specified postprocedural states: Secondary | ICD-10-CM | POA: Diagnosis not present

## 2017-10-01 ENCOUNTER — Other Ambulatory Visit: Payer: Self-pay | Admitting: Family Medicine

## 2017-10-01 DIAGNOSIS — Z1231 Encounter for screening mammogram for malignant neoplasm of breast: Secondary | ICD-10-CM

## 2017-10-15 DIAGNOSIS — I110 Hypertensive heart disease with heart failure: Secondary | ICD-10-CM | POA: Diagnosis not present

## 2017-10-15 DIAGNOSIS — E0842 Diabetes mellitus due to underlying condition with diabetic polyneuropathy: Secondary | ICD-10-CM | POA: Diagnosis not present

## 2017-10-24 DIAGNOSIS — M79661 Pain in right lower leg: Secondary | ICD-10-CM | POA: Diagnosis not present

## 2017-10-24 DIAGNOSIS — M79662 Pain in left lower leg: Secondary | ICD-10-CM | POA: Diagnosis not present

## 2017-10-24 DIAGNOSIS — M545 Low back pain: Secondary | ICD-10-CM | POA: Diagnosis not present

## 2017-10-24 DIAGNOSIS — G894 Chronic pain syndrome: Secondary | ICD-10-CM | POA: Diagnosis not present

## 2017-10-26 ENCOUNTER — Ambulatory Visit
Admission: RE | Admit: 2017-10-26 | Discharge: 2017-10-26 | Disposition: A | Discharge status: Home / Self Care | Payer: Medicare Other | Source: Ambulatory Visit | Attending: Family Medicine | Admitting: Family Medicine

## 2017-10-26 DIAGNOSIS — Z1231 Encounter for screening mammogram for malignant neoplasm of breast: Secondary | ICD-10-CM

## 2017-10-31 ENCOUNTER — Other Ambulatory Visit: Payer: Self-pay | Admitting: Cardiovascular Disease

## 2017-11-09 ENCOUNTER — Ambulatory Visit: Payer: Medicare Other | Admitting: Cardiovascular Disease

## 2017-11-21 DIAGNOSIS — G894 Chronic pain syndrome: Secondary | ICD-10-CM | POA: Diagnosis not present

## 2017-11-21 DIAGNOSIS — M79661 Pain in right lower leg: Secondary | ICD-10-CM | POA: Diagnosis not present

## 2017-11-21 DIAGNOSIS — M79662 Pain in left lower leg: Secondary | ICD-10-CM | POA: Diagnosis not present

## 2017-11-21 DIAGNOSIS — M545 Low back pain: Secondary | ICD-10-CM | POA: Diagnosis not present

## 2017-12-06 ENCOUNTER — Other Ambulatory Visit: Payer: Self-pay | Admitting: Physician Assistant

## 2017-12-26 DIAGNOSIS — M79662 Pain in left lower leg: Secondary | ICD-10-CM | POA: Diagnosis not present

## 2017-12-26 DIAGNOSIS — M545 Low back pain: Secondary | ICD-10-CM | POA: Diagnosis not present

## 2017-12-26 DIAGNOSIS — M79661 Pain in right lower leg: Secondary | ICD-10-CM | POA: Diagnosis not present

## 2017-12-26 DIAGNOSIS — G894 Chronic pain syndrome: Secondary | ICD-10-CM | POA: Diagnosis not present

## 2018-01-11 DIAGNOSIS — E0841 Diabetes mellitus due to underlying condition with diabetic mononeuropathy: Secondary | ICD-10-CM | POA: Diagnosis not present

## 2018-01-11 DIAGNOSIS — R21 Rash and other nonspecific skin eruption: Secondary | ICD-10-CM | POA: Diagnosis not present

## 2018-01-11 DIAGNOSIS — I110 Hypertensive heart disease with heart failure: Secondary | ICD-10-CM | POA: Diagnosis not present

## 2018-01-14 DIAGNOSIS — I10 Essential (primary) hypertension: Secondary | ICD-10-CM | POA: Diagnosis not present

## 2018-01-14 DIAGNOSIS — E782 Mixed hyperlipidemia: Secondary | ICD-10-CM | POA: Diagnosis not present

## 2018-01-14 DIAGNOSIS — K219 Gastro-esophageal reflux disease without esophagitis: Secondary | ICD-10-CM | POA: Diagnosis not present

## 2018-01-14 DIAGNOSIS — E1165 Type 2 diabetes mellitus with hyperglycemia: Secondary | ICD-10-CM | POA: Diagnosis not present

## 2018-01-23 DIAGNOSIS — M79661 Pain in right lower leg: Secondary | ICD-10-CM | POA: Diagnosis not present

## 2018-01-23 DIAGNOSIS — M545 Low back pain: Secondary | ICD-10-CM | POA: Diagnosis not present

## 2018-01-23 DIAGNOSIS — G894 Chronic pain syndrome: Secondary | ICD-10-CM | POA: Diagnosis not present

## 2018-01-23 DIAGNOSIS — M79662 Pain in left lower leg: Secondary | ICD-10-CM | POA: Diagnosis not present

## 2018-02-05 DIAGNOSIS — R5383 Other fatigue: Secondary | ICD-10-CM | POA: Diagnosis not present

## 2018-02-05 DIAGNOSIS — E119 Type 2 diabetes mellitus without complications: Secondary | ICD-10-CM | POA: Diagnosis not present

## 2018-02-05 DIAGNOSIS — R1084 Generalized abdominal pain: Secondary | ICD-10-CM | POA: Diagnosis not present

## 2018-02-05 DIAGNOSIS — I10 Essential (primary) hypertension: Secondary | ICD-10-CM | POA: Diagnosis not present

## 2018-02-05 DIAGNOSIS — I2510 Atherosclerotic heart disease of native coronary artery without angina pectoris: Secondary | ICD-10-CM | POA: Diagnosis not present

## 2018-02-21 DIAGNOSIS — G894 Chronic pain syndrome: Secondary | ICD-10-CM | POA: Diagnosis not present

## 2018-02-21 DIAGNOSIS — M545 Low back pain: Secondary | ICD-10-CM | POA: Diagnosis not present

## 2018-02-21 DIAGNOSIS — M79662 Pain in left lower leg: Secondary | ICD-10-CM | POA: Diagnosis not present

## 2018-02-21 DIAGNOSIS — M79661 Pain in right lower leg: Secondary | ICD-10-CM | POA: Diagnosis not present

## 2018-03-05 DIAGNOSIS — R1013 Epigastric pain: Secondary | ICD-10-CM | POA: Diagnosis not present

## 2018-03-05 DIAGNOSIS — R112 Nausea with vomiting, unspecified: Secondary | ICD-10-CM | POA: Diagnosis not present

## 2018-03-13 ENCOUNTER — Other Ambulatory Visit: Payer: Self-pay | Admitting: Cardiovascular Disease

## 2018-03-14 NOTE — Telephone Encounter (Signed)
OK to refill. cdm 

## 2018-03-14 NOTE — Telephone Encounter (Signed)
Called to pharmacy 

## 2018-03-18 ENCOUNTER — Encounter: Payer: Self-pay | Admitting: Cardiovascular Disease

## 2018-03-18 ENCOUNTER — Ambulatory Visit (INDEPENDENT_AMBULATORY_CARE_PROVIDER_SITE_OTHER): Payer: Medicare Other | Admitting: Cardiovascular Disease

## 2018-03-18 ENCOUNTER — Encounter (INDEPENDENT_AMBULATORY_CARE_PROVIDER_SITE_OTHER): Payer: Self-pay

## 2018-03-18 VITALS — BP 124/86 | HR 65 | Ht 64.5 in | Wt 199.8 lb

## 2018-03-18 DIAGNOSIS — I25810 Atherosclerosis of coronary artery bypass graft(s) without angina pectoris: Secondary | ICD-10-CM

## 2018-03-18 DIAGNOSIS — Z9889 Other specified postprocedural states: Secondary | ICD-10-CM | POA: Diagnosis not present

## 2018-03-18 DIAGNOSIS — I5032 Chronic diastolic (congestive) heart failure: Secondary | ICD-10-CM | POA: Diagnosis not present

## 2018-03-18 DIAGNOSIS — I10 Essential (primary) hypertension: Secondary | ICD-10-CM

## 2018-03-18 NOTE — Patient Instructions (Signed)
Medication Instructions:  none If you need a refill on your cardiac medications before your next appointment, please call your pharmacy.   Lab work: none If you have labs (blood work) drawn today and your tests are completely normal, you will receive your results only by: . MyChart Message (if you have MyChart) OR . A paper copy in the mail If you have any lab test that is abnormal or we need to change your treatment, we will call you to review the results.  Testing/Procedures: none  Follow-Up: At [facility], you and your health needs are our priority.  As part of our continuing mission to provide you with exceptional heart care, we have created designated Provider Care Teams.  These Care Teams include your primary Cardiologist (physician) and Advanced Practice Providers (APPs -  Physician Assistants and Nurse Practitioners) who all work together to provide you with the care you need, when you need it. You will need a follow up appointment in 6 months.  Please call our office 2 months in advance to schedule this appointment.  You may see Christopher McAlhany, MD or one of the following Advanced Practice Providers on your designated Care Team:   Brittainy Simmons, PA-C Dayna Dunn, PA-C . Michele Lenze, PA-C  Any Other Special Instructions Will Be Listed Below (If Applicable).    

## 2018-03-18 NOTE — Progress Notes (Signed)
Chief Complaint  Patient presents with  . Follow-up    CAD    History of Present Illness: 47 yo Park with history of CAD s/p 4VCABG 2011, mitral regurgitation s/p repair, chronic diastolic CHF, DM, HTN and hyperlipidemia who is here today for cardiac follow up. Cardiac cath  In December 2010 showed severe proximal LAD stenosis, severe mid intermediate disease with occluded mid RCA and anomalous Circumflex with severe disease. She also had severe mitral regurgitation. She underwent a 4V CABG  (LIMA to LAD, SVG to subbranch of ramus intermediate, SVG to lateral subbranch of ramus intermediate, SVG to distal RCA) and mitral valve repair January 2011. Echo September 2016 with normal LV systolic function, stable mitral valve repair. She had chest pain in June 2018 and her nuclear stress test showed ischemia. Cardiac cath on 09/22/16 showed progression of her CAD. LV function was normal. I placed a drug eluting stent in the vein graft to the PDA, a drug eluting stent in the native Ramus intermediate branch and a drug eluting stent in the protected left main. Repeat cardiac cath 02/07/17 and found to have continued occlusion of all three native vessels with patent LIMA to LAD, occluded SVG to ramus x 2, patent SVG to PDA but severe restenosis in the ramus intermediate stent. This was treated with another drug eluting stent. Her BP was soft in the hospital so Norvasc was stopped.  Coreg dose lowered at office visit in February 2019 due to fatigue.   She is here today for follow up. The patient denies any chest pain, dyspnea, palpitations, lower extremity edema, orthopnea, PND, dizziness, near syncope or syncope.   Primary Care Physician: Gwendel Hanson  Past Medical History:  Diagnosis Date  . Chronic diastolic CHF (congestive heart failure) ([place])   . Coronary artery disease    a. 4v CABG  (LIMA to LAD, SVG to subbranch of ramus intermediate, SVG to lateral subbranch of ramus intermediate, SVG  to distal RCA) and mitral valve repair January 2011. b. Abnormal nuc 09/2016 s/p  drug eluting stent to the vein graft to the PDA, a drug eluting stent in the native Ramus intermediate branch and a drug eluting stent in the protected left main. c. DES to ramus intermediate 02/2017.  . Diabetes mellitus   . H/O emotional problems   . Headache, migraine   . High cholesterol   . History of blood transfusion   . Hypertension   . MI, old   . MRSA (methicillin resistant Staphylococcus aureus)   . MVP (mitral valve prolapse)   . Neuromuscular disorder ([place])   . Neuropathy   . S/P mitral valve repair   . Sebaceous cyst     Past Surgical History:  Procedure Laterality Date  . ABDOMINAL HYSTERECTOMY    . CARDIAC CATHETERIZATION  09/22/2016  . CORONARY ARTERY BYPASS GRAFT  with valve repair  . CORONARY STENT INTERVENTION N/A 09/22/2016   Procedure: Coronary Stent Intervention;  Surgeon: Burnell Blanks, MD;  Location: [facility];  Service: Cardiovascular;  Laterality: N/A;  . CORONARY STENT INTERVENTION N/A 02/07/2017   Procedure: CORONARY STENT INTERVENTION;  Surgeon: Martinique, Peter M, MD;  Location: [facility];  Service: Cardiovascular;  Laterality: N/A;  . CORONARY STENT PLACEMENT  09/22/2016  . LEFT HEART CATH AND CORS/GRAFTS ANGIOGRAPHY N/A 09/22/2016   Procedure: Left Heart Cath and Cors/Grafts Angiography;  Surgeon: Burnell Blanks, MD;  Location: [facility];  Service: Cardiovascular;  Laterality: N/A;  . LEFT HEART CATH AND CORS/GRAFTS ANGIOGRAPHY N/A 02/07/2017   Procedure: LEFT HEART CATH AND CORS/GRAFTS ANGIOGRAPHY;  Surgeon: Martinique, Peter M, MD;  Location: [facility];  Service: Cardiovascular;  Laterality: N/A;  . SKIN GRAFT      Current Outpatient Medications  Medication Sig Dispense Refill  . aspirin 81 MG tablet Take 81 mg by mouth daily.     . clopidogrel (PLAVIX) 75 MG tablet TAKE 1 TABLET (75 MG TOTAL) BY MOUTH DAILY WITH BREAKFAST.  90 tablet 3  . ergocalciferol (VITAMIN D2) 1.25 MG (50000 UT) capsule Take 50,000 Units by mouth once a week.    . fluticasone (FLONASE) 50 MCG/ACT nasal spray Place 1 spray into both nostrils 2 (two) times daily.     . furosemide (LASIX) 20 MG tablet TAKE 1 TABLET BY MOUTH DAILY AS NEEDED FOR SWELLING 90 tablet 3  . insulin aspart (NOVOLOG FLEXPEN) 100 UNIT/ML injection Inject 0-12 Units into the skin See admin instructions. Per sliding scale Under 150 no units 150-199 take 8 units 200-249 take 10 units 250-299 take 12 units    . lisinopril (PRINIVIL,ZESTRIL) 5 MG tablet TAKE 1 TABLET (5 MG TOTAL) DAILY BY MOUTH. 90 tablet 2  . LYRICA CR 165 MG TB24 Take 165 mg daily by mouth.     . montelukast (SINGULAIR) 10 MG tablet Take 10 mg by mouth at bedtime.    . Naloxone HCl (EVZIO) 0.4 MG/0.4ML SOAJ Inject 0.4 mg as directed as needed (for possible overdose).     . nitroGLYCERIN (NITROSTAT) 0.4 MG SL tablet Place 1 tablet (0.4 mg total) under the tongue every 5 (five) minutes as needed for chest pain. 25 tablet 6  . oxyCODONE-acetaminophen (PERCOCET) 10-325 MG per tablet Take 1 tablet by mouth 3 (three) times daily.    . rosuvastatin (CRESTOR) 40 MG tablet TAKE 1 TABLET (40 MG TOTAL) EVERY EVENING BY MOUTH. 90 tablet 2  . Semaglutide 1 MG/DOSE SOPN Inject 1 mg once a week into the skin.    . SitaGLIPtin-MetFORMIN HCl (JANUMET XR) 50-1000 MG TB24 Take 1 tablet by mouth 2 (two) times daily.    . TRESIBA FLEXTOUCH 100 UNIT/ML SOPN FlexTouch Pen Inject 40 Units as directed at bedtime.   4  . zolpidem (AMBIEN) 5 MG tablet TAKE 1 TABLET BY MOUTH AT BEDTIME AS NEEDED FOR SLEEP 30 tablet 3  . carvedilol (COREG) 3.125 MG tablet Take 1 tablet (3.125 mg total) by mouth 2 (two) times daily. 180 tablet 3   No current facility-administered medications for this visit.     Allergies  Allergen Reactions  . Metformin Diarrhea and Nausea And Vomiting    Immediate release tablets (not metformin ER, pt currently  taking)    Social History   Socioeconomic History  . Marital status: Divorced    Spouse name: Not on file  . Number of children: 2  . Years of education: Not on file  . Highest education level: Not on file  Occupational History  . Occupation: Disability  Social Needs  . Financial resource strain: Not on file  . Food insecurity:    Worry: Not on file    Inability: Not on file  . Transportation needs:    Medical: No    Non-medical: No  Tobacco Use  . Smoking status: Former Smoker    Packs/day: 1.00    Years: 28.00    Pack years: 28.00    Types: Cigarettes    Last attempt to  quit: 03/31/2009    Years since quitting: 8.9  . Smokeless tobacco: Never Used  Substance and Sexual Activity  . Alcohol use: No  . Drug use: No  . Sexual activity: Yes    Birth control/protection: Surgical    Comment: partial hysterectomy  Lifestyle  . Physical activity:    Days per week: 0 days    Minutes per session: 0 min  . Stress: Only a little  Relationships  . Social connections:    Talks on phone: Not on file    Gets together: Not on file    Attends religious service: Not on file    Active member of club or organization: Not on file    Attends meetings of clubs or organizations: Not on file    Relationship status: Not on file  . Intimate partner violence:    Fear of current or ex partner: Not on file    Emotionally abused: Not on file    Physically abused: Not on file    Forced sexual activity: Not on file  Other Topics Concern  . Not on file  Social History Narrative  . Not on file    Family History  Problem Relation Age of Onset  . Heart disease Father   . Asthma Father   . Hypertension Father   . Diabetes Father   . Arthritis Father   . Heart disease Mother   . Hypertension Mother   . Diabetes Mother   . Migraines Mother   . Stroke Mother   . Emphysema Maternal Grandmother   . Hypertension Brother   . Diabetes Brother     Review of Systems:  As stated in the  HPI and otherwise negative.   BP 124/Elizabeth   Pulse 65   Ht 5' 4.5" (1.638 m)   Wt 199 lb 12.8 oz (90.6 kg)   SpO2 98%   BMI 33.77 kg/m   Physical Examination:  General: Well developed, well nourished, NAD  HEENT: OP clear, mucus membranes moist  SKIN: warm, dry. No rashes. Neuro: No focal deficits  Musculoskeletal: Muscle strength 5/5 all ext  Psychiatric: Mood and affect normal  Neck: No JVD, no carotid bruits, no thyromegaly, no lymphadenopathy.  Lungs:Clear bilaterally, no wheezes, rhonci, crackles Cardiovascular: Regular rate and rhythm. No murmurs, gallops or rubs. Abdomen:Soft. Bowel sounds present. Non-tender.  Extremities: No lower extremity edema. Pulses are 2 + in the bilateral DP/PT.  Echo September 2016: Left ventricle: The cavity size was normal. Wall thickness was   normal. Systolic function was normal. The estimated ejection   fraction was in the range of 55% to 60%. Wall motion was normal;   there were no regional wall motion abnormalities. Doppler   parameters are consistent with abnormal left ventricular   relaxation (grade 1 diastolic dysfunction). - Aortic valve: There was no stenosis. - Mitral valve: Status post mitral valve repair. Probably mild   mitral stenosis. There was no significant regurgitation. Mean   gradient (D): 6 mm Hg. Valve area by pressure half-time: 1.3   cm^2. - Right ventricle: The cavity size was normal. Systolic function   was mildly reduced. - Pulmonary arteries: No complete TR doppler jet so unable to   estimate PA systolic pressure. - Inferior vena cava: The vessel was normal in size. The   respirophasic diameter changes were in the normal range (>= 50%),   consistent with normal central venous pressure.  EKG:  EKG is not ordered today. The ekg ordered today demonstrates  Recent Labs: 04/12/2017: ALT 16   Lipid Panel  Lipid Panel     Component Value Date/Time   CHOL 126 04/12/2017 0842   TRIG 164 (H) 04/12/2017 0842    HDL 32 (L) 04/12/2017 0842   CHOLHDL 3.9 04/12/2017 0842   CHOLHDL 8.9 04/03/2009 0320   VLDL 54 (H) 04/03/2009 0320   LDLCALC 61 04/12/2017 0842    Wt Readings from Last 3 Encounters:  03/18/18 199 lb 12.8 oz (90.6 kg)  09/24/17 207 lb 12.8 oz (94.3 kg)  07/04/17 207 lb 10.8 oz (94.2 kg)     Other studies Reviewed: Additional studies/ records that were reviewed today include: . Review of the above records demonstrates:    Assessment and Plan:   1. CAD without angina: She has no chest pain worrisome for angina. Will continue ASA, Plavix, statin and beta blocker.     2. HYPERLIPIDEMIA: LDL at goal in April 2019 in primary care. Continue statin.   3. HYPERTENSION: BP is controlled  4. TOBACCO ABUSE, in remission: She stopped smoking in 2010.   5. Mitral regurgitation: Echo in 2016 showed that the mitral valve repair was stable.   6. Chronic diastolic CHF: Weight is stable. Lasix as needed.   7. Leg pain: ABI normal November 2018  8. Insomnia: Continue Ambien  OK to hold Plavix one week before her endoscopy.   Current medicines are reviewed at length with the patient today.  The patient does not have concerns regarding medicines.  The following changes have been made:  no change  Labs/ tests ordered today include:   No orders of the defined types were placed in this encounter.   Disposition:   FU with me in 6  months  Signed, Lauree Chandler, MD 03/18/2018 10:33 AM    [facility] [place], [place], [place]  [address] Phone: [phone]; Fax: [phone]

## 2018-03-26 DIAGNOSIS — M545 Low back pain: Secondary | ICD-10-CM | POA: Diagnosis not present

## 2018-03-26 DIAGNOSIS — M79662 Pain in left lower leg: Secondary | ICD-10-CM | POA: Diagnosis not present

## 2018-03-26 DIAGNOSIS — G894 Chronic pain syndrome: Secondary | ICD-10-CM | POA: Diagnosis not present

## 2018-03-26 DIAGNOSIS — M79661 Pain in right lower leg: Secondary | ICD-10-CM | POA: Diagnosis not present

## 2018-04-03 ENCOUNTER — Encounter (HOSPITAL_BASED_OUTPATIENT_CLINIC_OR_DEPARTMENT_OTHER): Payer: Self-pay

## 2018-04-03 ENCOUNTER — Emergency Department (HOSPITAL_BASED_OUTPATIENT_CLINIC_OR_DEPARTMENT_OTHER)
Admission: EM | Admit: 2018-04-03 | Discharge: 2018-04-03 | Disposition: A | Discharge status: Home / Self Care | Payer: Medicare Other | Attending: Emergency Medicine | Admitting: Emergency Medicine

## 2018-04-03 ENCOUNTER — Other Ambulatory Visit: Payer: Self-pay

## 2018-04-03 DIAGNOSIS — I2510 Atherosclerotic heart disease of native coronary artery without angina pectoris: Secondary | ICD-10-CM | POA: Diagnosis not present

## 2018-04-03 DIAGNOSIS — E119 Type 2 diabetes mellitus without complications: Secondary | ICD-10-CM | POA: Diagnosis not present

## 2018-04-03 DIAGNOSIS — Z794 Long term (current) use of insulin: Secondary | ICD-10-CM | POA: Insufficient documentation

## 2018-04-03 DIAGNOSIS — Z79899 Other long term (current) drug therapy: Secondary | ICD-10-CM | POA: Diagnosis not present

## 2018-04-03 DIAGNOSIS — Z7982 Long term (current) use of aspirin: Secondary | ICD-10-CM | POA: Insufficient documentation

## 2018-04-03 DIAGNOSIS — I110 Hypertensive heart disease with heart failure: Secondary | ICD-10-CM | POA: Diagnosis not present

## 2018-04-03 DIAGNOSIS — J069 Acute upper respiratory infection, unspecified: Secondary | ICD-10-CM | POA: Insufficient documentation

## 2018-04-03 DIAGNOSIS — Z7901 Long term (current) use of anticoagulants: Secondary | ICD-10-CM | POA: Diagnosis not present

## 2018-04-03 DIAGNOSIS — J029 Acute pharyngitis, unspecified: Secondary | ICD-10-CM | POA: Diagnosis present

## 2018-04-03 DIAGNOSIS — I252 Old myocardial infarction: Secondary | ICD-10-CM | POA: Diagnosis not present

## 2018-04-03 DIAGNOSIS — I5032 Chronic diastolic (congestive) heart failure: Secondary | ICD-10-CM | POA: Insufficient documentation

## 2018-04-03 DIAGNOSIS — J028 Acute pharyngitis due to other specified organisms: Secondary | ICD-10-CM | POA: Diagnosis not present

## 2018-04-03 DIAGNOSIS — Z87891 Personal history of nicotine dependence: Secondary | ICD-10-CM | POA: Insufficient documentation

## 2018-04-03 LAB — GROUP A STREP BY PCR: Group A Strep by PCR: NOT DETECTED

## 2018-04-03 NOTE — ED Triage Notes (Signed)
C/o flu like sx x 5 days-NAD-steady gait

## 2018-04-03 NOTE — ED Provider Notes (Signed)
[facility] EMERGENCY DEPARTMENT Provider Note   CSN: [id number] Arrival date & time: 04/03/18  1103     History   Chief Complaint Chief Complaint  Patient presents with  . Cough    HPI Elizabeth Park is a 48 y.o. female.  HPI   Sore throat started Saturday, now pain radiating to the ears, hurts to swallow, cough, talk. Tried over the counter medications without relief.  Getting worse. Severe. Has endoscopy scheduled soon so wanted to get checked out  Just starting to have dry cough.  No runny nose, no fevers.    Past Medical History:  Diagnosis Date  . Chronic diastolic CHF (congestive heart failure) ([place])   . Coronary artery disease    a. 4v CABG  (LIMA to LAD, SVG to subbranch of ramus intermediate, SVG to lateral subbranch of ramus intermediate, SVG to distal RCA) and mitral valve repair January 2011. b. Abnormal nuc 09/2016 s/p  drug eluting stent to the vein graft to the PDA, a drug eluting stent in the native Ramus intermediate branch and a drug eluting stent in the protected left main. c. DES to ramus intermediate 02/2017.  . Diabetes mellitus   . H/O emotional problems   . Headache, migraine   . High cholesterol   . History of blood transfusion   . Hypertension   . MI, old   . MRSA (methicillin resistant Staphylococcus aureus)   . MVP (mitral valve prolapse)   . Neuromuscular disorder ([place])   . Neuropathy   . S/P mitral valve repair   . Sebaceous cyst     Patient Active Problem List   Diagnosis Date Noted  . ACS (acute coronary syndrome) ([place]) 02/07/2017  . Unstable angina ([place])   . Follow-up examination, following unspecified surgery 01/29/2012  . S/P mitral valve repair 11/05/2011  . TOBACCO USE, QUIT 10/15/2009  . PERIPHERAL NEUROPATHY, FEET 09/02/2009  . CAD, ARTERY BYPASS GRAFT 05/11/2009  . MITRAL REGURGITATION 04/28/2009  . Hyperlipidemia 04/26/2009  . Essential hypertension 04/26/2009  . SEBACEOUS CYST 10/09/2008  . Type 2 diabetes  mellitus with complication ([place]) 53/66/4403  . TOBACCO ABUSE 04/10/2008  . FIBROCYSTIC BREAST DISEASE 04/10/2008    Past Surgical History:  Procedure Laterality Date  . ABDOMINAL HYSTERECTOMY    . CARDIAC CATHETERIZATION  09/22/2016  . CORONARY ARTERY BYPASS GRAFT  with valve repair  . CORONARY STENT INTERVENTION N/A 09/22/2016   Procedure: Coronary Stent Intervention;  Surgeon: Burnell Blanks, MD;  Location: [facility];  Service: Cardiovascular;  Laterality: N/A;  . CORONARY STENT INTERVENTION N/A 02/07/2017   Procedure: CORONARY STENT INTERVENTION;  Surgeon: Martinique, Peter M, MD;  Location: [facility];  Service: Cardiovascular;  Laterality: N/A;  . CORONARY STENT PLACEMENT  09/22/2016  . LEFT HEART CATH AND CORS/GRAFTS ANGIOGRAPHY N/A 09/22/2016   Procedure: Left Heart Cath and Cors/Grafts Angiography;  Surgeon: Burnell Blanks, MD;  Location: [facility];  Service: Cardiovascular;  Laterality: N/A;  . LEFT HEART CATH AND CORS/GRAFTS ANGIOGRAPHY N/A 02/07/2017   Procedure: LEFT HEART CATH AND CORS/GRAFTS ANGIOGRAPHY;  Surgeon: Martinique, Peter M, MD;  Location: [facility];  Service: Cardiovascular;  Laterality: N/A;  . SKIN GRAFT       OB History    Gravida  2   Para  2   Term  2   Preterm      AB      Living  2     SAB  TAB      Ectopic      Multiple      Live Births               Home Medications    Prior to Admission medications   Medication Sig Start Date End Date Taking? Authorizing Provider  aspirin 81 MG tablet Take 81 mg by mouth daily.  11/02/11  Yes Hillary Bow, MD  carvedilol (COREG) 3.125 MG tablet Take 1 tablet (3.125 mg total) by mouth 2 (two) times daily. 05/16/17 04/03/18 Yes Burnell Blanks, MD  clopidogrel (PLAVIX) 75 MG tablet TAKE 1 TABLET (75 MG TOTAL) BY MOUTH DAILY WITH BREAKFAST. 12/06/17  Yes Eileen Stanford, PA-C  ergocalciferol (VITAMIN D2) 1.25 MG (50000 UT) capsule Take  50,000 Units by mouth once a week.   Yes [provider]  fluticasone (FLONASE) 50 MCG/ACT nasal spray Place 1 spray into both nostrils 2 (two) times daily.    Yes [provider]  insulin aspart (NOVOLOG FLEXPEN) 100 UNIT/ML injection Inject 0-12 Units into the skin See admin instructions. Per sliding scale Under 150 no units 150-199 take 8 units 200-249 take 10 units 250-299 take 12 units   Yes [provider]  lisinopril (PRINIVIL,ZESTRIL) 5 MG tablet TAKE 1 TABLET (5 MG TOTAL) DAILY BY MOUTH. 09/10/17  Yes Dunn, Dayna N, PA-C  LYRICA CR 165 MG TB24 Take 165 mg daily by mouth.  01/29/17  Yes [provider]  montelukast (SINGULAIR) 10 MG tablet Take 10 mg by mouth at bedtime.   Yes [provider]  Naloxone HCl (EVZIO) 0.4 MG/0.4ML SOAJ Inject 0.4 mg as directed as needed (for possible overdose).    Yes [provider]  nitroGLYCERIN (NITROSTAT) 0.4 MG SL tablet Place 1 tablet (0.4 mg total) under the tongue every 5 (five) minutes as needed for chest pain. 10/27/16 02/07/22 Yes Burnell Blanks, MD  oxyCODONE-acetaminophen (PERCOCET) 10-325 MG per tablet Take 1 tablet by mouth 3 (three) times daily.   Yes [provider]  rosuvastatin (CRESTOR) 40 MG tablet TAKE 1 TABLET (40 MG TOTAL) EVERY EVENING BY MOUTH. 09/10/17  Yes Dunn, Dayna N, PA-C  Semaglutide 1 MG/DOSE SOPN Inject 1 mg once a week into the skin.   Yes [provider]  SitaGLIPtin-MetFORMIN HCl (JANUMET XR) 50-1000 MG TB24 Take 1 tablet by mouth 2 (two) times daily.   Yes [provider]  zolpidem (AMBIEN) 5 MG tablet TAKE 1 TABLET BY MOUTH AT BEDTIME AS NEEDED FOR SLEEP 03/14/18  Yes Burnell Blanks, MD  furosemide (LASIX) 20 MG tablet TAKE 1 TABLET BY MOUTH DAILY AS NEEDED FOR SWELLING 10/31/17   Burnell Blanks, MD  TRESIBA FLEXTOUCH 100 UNIT/ML SOPN FlexTouch Pen Inject 40 Units as directed at bedtime.  08/13/16   [provider]    Family History Family History  Problem Relation Age of Onset  . Heart disease Father   . Asthma Father   . Hypertension Father   . Diabetes Father   . Arthritis Father   . Heart disease Mother   . Hypertension Mother   . Diabetes Mother   . Migraines Mother   . Stroke Mother   . Emphysema Maternal Grandmother   . Hypertension Brother   . Diabetes Brother     Social History Social History   Tobacco Use  . Smoking status: Former Smoker    Packs/day: 1.00    Years: 28.00    Pack years: 28.00  Types: Cigarettes    Last attempt to quit: 03/31/2009    Years since quitting: 9.0  . Smokeless tobacco: Never Used  Substance Use Topics  . Alcohol use: No  . Drug use: No     Allergies   Metformin   Review of Systems Review of Systems  Constitutional: Negative for fever.  HENT: Positive for sore throat.   Eyes: Negative for visual disturbance.  Respiratory: Negative for cough and shortness of breath.   Cardiovascular: Negative for chest pain.  Gastrointestinal: Negative for abdominal pain (has endoscopy scheduled monday), nausea and vomiting.  Genitourinary: Negative for difficulty urinating and dysuria.  Musculoskeletal: Negative for back pain and neck pain.  Skin: Negative for rash.  Neurological: Negative for syncope and headaches.     Physical Exam Updated Vital Signs BP (!) 158/97 (BP Location: Right Arm)   Pulse 77   Temp 98.1 F (36.7 C) (Oral)   Resp 18   Ht 5\' 4"  (1.626 m)   Wt 86.6 kg   SpO2 99%   BMI 32.79 kg/m   Physical Exam Vitals signs and nursing note reviewed.  Constitutional:      General: She is not in acute distress.    Appearance: She is well-developed. She is not diaphoretic.  HENT:     Head: Normocephalic and atraumatic.  Eyes:     Conjunctiva/sclera: Conjunctivae normal.  Neck:     Musculoskeletal: Normal range of motion.  Cardiovascular:     Rate and Rhythm: Normal rate and regular rhythm.     Heart  sounds: Normal heart sounds. No murmur. No friction rub. No gallop.   Pulmonary:     Effort: Pulmonary effort is normal. No respiratory distress.     Breath sounds: Normal breath sounds. No wheezing or rales.  Abdominal:     General: There is no distension.     Palpations: Abdomen is soft.     Tenderness: There is no abdominal tenderness. There is no guarding.  Musculoskeletal:        General: No tenderness.  Skin:    General: Skin is warm and dry.     Findings: No erythema or rash.  Neurological:     Mental Status: She is alert and oriented to person, place, and time.      ED Treatments / Results  Labs (all labs ordered are listed, but only abnormal results are displayed) Labs Reviewed  GROUP A STREP BY PCR    EKG None  Radiology No results found.  Procedures Procedures (including critical care time)  Medications Ordered in ED Medications - No data to display   Initial Impression / Assessment and Plan / ED Course  I have reviewed the triage vital signs and the nursing notes.  Pertinent labs & imaging results that were available during my care of the patient were reviewed by me and considered in my medical decision making (see chart for details).     48 year old female with complicated medical history listed above presents with concern for sore throat.  No signs of retropharyngeal abscess, epiglottitis, peritonsillar abscess on exam.  Strep screen is negative.  Suspect most likely viral pharyngitis, however did discuss that other etiologies such as reflux are possible.  History and physical exam at this time are not consistent with influenza or pneumonia.  Recommend continued supportive care and outpatient follow-up.  Final Clinical Impressions(s) / ED Diagnoses   Final diagnoses:  Viral pharyngitis    ED Discharge Orders    None  Gareth Morgan, MD 04/04/18 [phone]

## 2018-04-08 DIAGNOSIS — E119 Type 2 diabetes mellitus without complications: Secondary | ICD-10-CM | POA: Diagnosis not present

## 2018-04-08 DIAGNOSIS — Z01818 Encounter for other preprocedural examination: Secondary | ICD-10-CM | POA: Diagnosis not present

## 2018-04-08 DIAGNOSIS — R1013 Epigastric pain: Secondary | ICD-10-CM | POA: Diagnosis not present

## 2018-04-10 DIAGNOSIS — K2270 Barrett's esophagus without dysplasia: Secondary | ICD-10-CM | POA: Diagnosis not present

## 2018-04-12 DIAGNOSIS — R109 Unspecified abdominal pain: Secondary | ICD-10-CM | POA: Diagnosis not present

## 2018-04-17 DIAGNOSIS — J029 Acute pharyngitis, unspecified: Secondary | ICD-10-CM | POA: Diagnosis not present

## 2018-04-17 DIAGNOSIS — E119 Type 2 diabetes mellitus without complications: Secondary | ICD-10-CM | POA: Diagnosis not present

## 2018-04-17 DIAGNOSIS — R5383 Other fatigue: Secondary | ICD-10-CM | POA: Diagnosis not present

## 2018-04-17 DIAGNOSIS — Z951 Presence of aortocoronary bypass graft: Secondary | ICD-10-CM | POA: Diagnosis not present

## 2018-04-17 DIAGNOSIS — R0602 Shortness of breath: Secondary | ICD-10-CM | POA: Diagnosis not present

## 2018-04-26 DIAGNOSIS — M545 Low back pain: Secondary | ICD-10-CM | POA: Diagnosis not present

## 2018-04-26 DIAGNOSIS — G894 Chronic pain syndrome: Secondary | ICD-10-CM | POA: Diagnosis not present

## 2018-04-26 DIAGNOSIS — M79662 Pain in left lower leg: Secondary | ICD-10-CM | POA: Diagnosis not present

## 2018-04-26 DIAGNOSIS — M79661 Pain in right lower leg: Secondary | ICD-10-CM | POA: Diagnosis not present

## 2018-05-01 DIAGNOSIS — R5383 Other fatigue: Secondary | ICD-10-CM | POA: Diagnosis not present

## 2018-05-01 DIAGNOSIS — E119 Type 2 diabetes mellitus without complications: Secondary | ICD-10-CM | POA: Diagnosis not present

## 2018-05-01 DIAGNOSIS — I2510 Atherosclerotic heart disease of native coronary artery without angina pectoris: Secondary | ICD-10-CM | POA: Diagnosis not present

## 2018-05-01 DIAGNOSIS — B2700 Gammaherpesviral mononucleosis without complication: Secondary | ICD-10-CM | POA: Diagnosis not present

## 2018-05-02 ENCOUNTER — Other Ambulatory Visit: Payer: Self-pay | Admitting: Cardiovascular Disease

## 2018-05-03 DIAGNOSIS — K449 Diaphragmatic hernia without obstruction or gangrene: Secondary | ICD-10-CM | POA: Diagnosis not present

## 2018-05-03 DIAGNOSIS — K2270 Barrett's esophagus without dysplasia: Secondary | ICD-10-CM | POA: Diagnosis not present

## 2018-05-03 DIAGNOSIS — A048 Other specified bacterial intestinal infections: Secondary | ICD-10-CM | POA: Diagnosis not present

## 2018-05-03 DIAGNOSIS — K2970 Gastritis, unspecified, without bleeding: Secondary | ICD-10-CM | POA: Diagnosis not present

## 2018-05-15 DIAGNOSIS — I10 Essential (primary) hypertension: Secondary | ICD-10-CM | POA: Diagnosis not present

## 2018-05-15 DIAGNOSIS — E669 Obesity, unspecified: Secondary | ICD-10-CM | POA: Diagnosis not present

## 2018-05-15 DIAGNOSIS — R51 Headache: Secondary | ICD-10-CM | POA: Diagnosis not present

## 2018-05-15 DIAGNOSIS — E119 Type 2 diabetes mellitus without complications: Secondary | ICD-10-CM | POA: Diagnosis not present

## 2018-05-15 DIAGNOSIS — M25512 Pain in left shoulder: Secondary | ICD-10-CM | POA: Diagnosis not present

## 2018-05-22 DIAGNOSIS — M79662 Pain in left lower leg: Secondary | ICD-10-CM | POA: Diagnosis not present

## 2018-05-22 DIAGNOSIS — M545 Low back pain: Secondary | ICD-10-CM | POA: Diagnosis not present

## 2018-05-22 DIAGNOSIS — G894 Chronic pain syndrome: Secondary | ICD-10-CM | POA: Diagnosis not present

## 2018-05-22 DIAGNOSIS — M79661 Pain in right lower leg: Secondary | ICD-10-CM | POA: Diagnosis not present

## 2018-05-23 DIAGNOSIS — M25512 Pain in left shoulder: Secondary | ICD-10-CM | POA: Diagnosis not present

## 2018-05-23 DIAGNOSIS — M19012 Primary osteoarthritis, left shoulder: Secondary | ICD-10-CM | POA: Diagnosis not present

## 2018-05-23 DIAGNOSIS — G8929 Other chronic pain: Secondary | ICD-10-CM | POA: Diagnosis not present

## 2018-05-31 DIAGNOSIS — K2270 Barrett's esophagus without dysplasia: Secondary | ICD-10-CM | POA: Diagnosis not present

## 2018-05-31 DIAGNOSIS — K2970 Gastritis, unspecified, without bleeding: Secondary | ICD-10-CM | POA: Diagnosis not present

## 2018-06-05 DIAGNOSIS — M25512 Pain in left shoulder: Secondary | ICD-10-CM | POA: Diagnosis not present

## 2018-06-05 DIAGNOSIS — M75112 Incomplete rotator cuff tear or rupture of left shoulder, not specified as traumatic: Secondary | ICD-10-CM | POA: Diagnosis not present

## 2018-06-11 ENCOUNTER — Other Ambulatory Visit: Payer: Self-pay | Admitting: Physician Assistant

## 2018-06-12 ENCOUNTER — Other Ambulatory Visit: Payer: Self-pay | Admitting: Physician Assistant

## 2018-06-13 DIAGNOSIS — E119 Type 2 diabetes mellitus without complications: Secondary | ICD-10-CM | POA: Diagnosis not present

## 2018-06-13 DIAGNOSIS — E669 Obesity, unspecified: Secondary | ICD-10-CM | POA: Diagnosis not present

## 2018-06-25 DIAGNOSIS — M79661 Pain in right lower leg: Secondary | ICD-10-CM | POA: Diagnosis not present

## 2018-06-25 DIAGNOSIS — G894 Chronic pain syndrome: Secondary | ICD-10-CM | POA: Diagnosis not present

## 2018-06-25 DIAGNOSIS — M545 Low back pain: Secondary | ICD-10-CM | POA: Diagnosis not present

## 2018-06-25 DIAGNOSIS — M79662 Pain in left lower leg: Secondary | ICD-10-CM | POA: Diagnosis not present

## 2018-07-16 DIAGNOSIS — E1140 Type 2 diabetes mellitus with diabetic neuropathy, unspecified: Secondary | ICD-10-CM | POA: Diagnosis not present

## 2018-07-16 DIAGNOSIS — I10 Essential (primary) hypertension: Secondary | ICD-10-CM | POA: Diagnosis not present

## 2018-07-16 DIAGNOSIS — E118 Type 2 diabetes mellitus with unspecified complications: Secondary | ICD-10-CM | POA: Diagnosis not present

## 2018-07-16 DIAGNOSIS — Z951 Presence of aortocoronary bypass graft: Secondary | ICD-10-CM | POA: Diagnosis not present

## 2018-07-23 DIAGNOSIS — G894 Chronic pain syndrome: Secondary | ICD-10-CM | POA: Diagnosis not present

## 2018-07-23 DIAGNOSIS — M79661 Pain in right lower leg: Secondary | ICD-10-CM | POA: Diagnosis not present

## 2018-07-23 DIAGNOSIS — M545 Low back pain: Secondary | ICD-10-CM | POA: Diagnosis not present

## 2018-07-23 DIAGNOSIS — M79662 Pain in left lower leg: Secondary | ICD-10-CM | POA: Diagnosis not present

## 2018-07-24 DIAGNOSIS — I10 Essential (primary) hypertension: Secondary | ICD-10-CM | POA: Diagnosis not present

## 2018-07-24 DIAGNOSIS — E119 Type 2 diabetes mellitus without complications: Secondary | ICD-10-CM | POA: Diagnosis not present

## 2018-07-24 DIAGNOSIS — E669 Obesity, unspecified: Secondary | ICD-10-CM | POA: Diagnosis not present

## 2018-07-24 DIAGNOSIS — R5383 Other fatigue: Secondary | ICD-10-CM | POA: Diagnosis not present

## 2018-07-24 DIAGNOSIS — Z951 Presence of aortocoronary bypass graft: Secondary | ICD-10-CM | POA: Diagnosis not present

## 2018-07-29 ENCOUNTER — Telehealth: Payer: Self-pay | Admitting: Cardiovascular Disease

## 2018-07-29 NOTE — Telephone Encounter (Signed)
Pt calling requesting a refill on zolpidem (Ambiem) 5 mg tablet. Would Dr. Angelena Form like to refill this medication? Please address

## 2018-07-30 MED ORDER — ZOLPIDEM TARTRATE 5 MG PO TABS: 5.0000 mg | ORAL_TABLET | Freq: Every evening | ORAL | 5 refills | Status: DC | PRN

## 2018-07-30 NOTE — Telephone Encounter (Signed)
Dr. Angelena Form how many refills would you like to give the pt?

## 2018-07-30 NOTE — Telephone Encounter (Signed)
We can refill this for her.   Thanks,  Gerald Stabs

## 2018-07-30 NOTE — Telephone Encounter (Signed)
We can give her five refills. Thanks, chris

## 2018-07-30 NOTE — Telephone Encounter (Signed)
Pt's medication zolpidem 5 mg tablet was called into pt's pharmacy. Pharmacy tech verbalized understanding.

## 2018-08-20 DIAGNOSIS — M79662 Pain in left lower leg: Secondary | ICD-10-CM | POA: Diagnosis not present

## 2018-08-20 DIAGNOSIS — M545 Low back pain: Secondary | ICD-10-CM | POA: Diagnosis not present

## 2018-08-20 DIAGNOSIS — G894 Chronic pain syndrome: Secondary | ICD-10-CM | POA: Diagnosis not present

## 2018-08-20 DIAGNOSIS — M79661 Pain in right lower leg: Secondary | ICD-10-CM | POA: Diagnosis not present

## 2018-08-22 DIAGNOSIS — E669 Obesity, unspecified: Secondary | ICD-10-CM | POA: Diagnosis not present

## 2018-08-22 DIAGNOSIS — E119 Type 2 diabetes mellitus without complications: Secondary | ICD-10-CM | POA: Diagnosis not present

## 2018-08-22 DIAGNOSIS — Z951 Presence of aortocoronary bypass graft: Secondary | ICD-10-CM | POA: Diagnosis not present

## 2018-09-17 DIAGNOSIS — M545 Low back pain: Secondary | ICD-10-CM | POA: Diagnosis not present

## 2018-09-17 DIAGNOSIS — M79662 Pain in left lower leg: Secondary | ICD-10-CM | POA: Diagnosis not present

## 2018-09-17 DIAGNOSIS — M79661 Pain in right lower leg: Secondary | ICD-10-CM | POA: Diagnosis not present

## 2018-09-17 DIAGNOSIS — G894 Chronic pain syndrome: Secondary | ICD-10-CM | POA: Diagnosis not present

## 2018-10-07 ENCOUNTER — Other Ambulatory Visit: Payer: Self-pay | Admitting: Cardiology

## 2018-10-07 DIAGNOSIS — Z1231 Encounter for screening mammogram for malignant neoplasm of breast: Secondary | ICD-10-CM

## 2018-10-16 ENCOUNTER — Other Ambulatory Visit: Payer: Self-pay | Admitting: Cardiovascular Disease

## 2018-10-16 DIAGNOSIS — E118 Type 2 diabetes mellitus with unspecified complications: Secondary | ICD-10-CM | POA: Diagnosis not present

## 2018-10-16 DIAGNOSIS — E1140 Type 2 diabetes mellitus with diabetic neuropathy, unspecified: Secondary | ICD-10-CM | POA: Diagnosis not present

## 2018-10-16 DIAGNOSIS — I10 Essential (primary) hypertension: Secondary | ICD-10-CM | POA: Diagnosis not present

## 2018-10-16 DIAGNOSIS — Z951 Presence of aortocoronary bypass graft: Secondary | ICD-10-CM | POA: Diagnosis not present

## 2018-10-23 DIAGNOSIS — M79661 Pain in right lower leg: Secondary | ICD-10-CM | POA: Diagnosis not present

## 2018-10-23 DIAGNOSIS — M79662 Pain in left lower leg: Secondary | ICD-10-CM | POA: Diagnosis not present

## 2018-10-23 DIAGNOSIS — G894 Chronic pain syndrome: Secondary | ICD-10-CM | POA: Diagnosis not present

## 2018-10-23 DIAGNOSIS — M545 Low back pain: Secondary | ICD-10-CM | POA: Diagnosis not present

## 2018-11-01 ENCOUNTER — Other Ambulatory Visit: Payer: Self-pay | Admitting: Cardiovascular Disease

## 2018-11-13 NOTE — Progress Notes (Signed)
Cardiology Office Note    Date:  11/14/2018   ID:  Elizabeth Park, DOB [birth year], MRN [id number]  PCP:  Secundino Ginger, PA-C  Cardiologist:  Lauree Chandler, MD  Electrophysiologist:  None   Chief Complaint: 6 month f/u CAD  History of Present Illness:   Elizabeth Park is a 48 y.o. female with history of CAD s/p 4V CABG 2011 and mitral valve repair for severe mitral regurgitation, DESx3 in 09/2016, DES to ramus intermediate in 02/2017, DM, HTN, HLD, former tobacco abuse, chronic diastolic CHF who presents for follow-up.  To recap, cardiac cath 04/01/09 performed by Dr. [name] showed severe proximal LAD stenosis, severe mid intermediate disease with occluded mid RCA and anomalous circumflex with severe disease. She also had severe mitral regurgitation. She underwent a a 4V CABG  (LIMA to LAD, SVG to subbranch of ramus intermediate, SVG to lateral subbranch of ramus intermediate, SVG to distal RCA) and mitral valve repair January 2011. She had chest pain in June 2018 that felt unlike prior angina but her nuclear stress test suggested ischemia. She underwent cardiac cath on 09/22/16 which showed progression of her CAD with resultant drug eluting stent to the vein graft to the PDA, a drug eluting stent in the native ramus intermediate branch and a drug eluting stent in the protected left main. Last echo in 2016 was technically difficult, EF 55-60%, grade 1 DD, mildly reduced RV function, s/p mitral valve repair with no significant MR, possible mild mitral stenosis. She had prior OV with Dr. Angelena Form 10/2016 at which time he prescribed PRN Lasix for intermittent LE edema given prior elevated filling pressures on cath. In 02/2017 she developed recurrent chest pain with mixed features. Initially elective heart cath was arranged but outpatient troponin was positive so she was asked to present to the ED that night. Cardiac cath 02/07/17 showed severe 3 vessel disease, patent LIMA-LAD, occluded SVG-ramus x  2, patent SVG-dRCA, normal LVEDP, s/p DES to ramus intermediate. Dr. Angelena Form has recommended lifelong DAPT given her multiple stents. Amlodipine was previously stopped due to soft BP. Coreg was later further adjusted due to fatigue as well. Last labs 04/2017 showed LDL 61, normal LFTs, 2018 K 3.5, Cr 0.69. Per report she had normal ABI for leg pain in 02/2017 (ordered by podiatrist).  She presents for general cardiology follow-up reporting she is feeling well overall. She has not had any recurrent symptoms like prior angina. She has a rare brief localized cramp/puffy sensation at the bottom of her sternal scar but this is not elicited with exertion and is very focal. She does admit she doesn't do any regular exercise and we discussed the importance of gradually introducing an exercise program, even walking to be beneficial. She notices fatigue with longer duration of walking but this is not new for her. She does not feel she's had any interim change since last OV from cardiac standpoint. She has noticed increasing generalized daytime fatigue the last few months. She does not wake feeling well rested despite taking Ambien at night. She isn't sure if she snores. She usually has to take a late afternoon nap. She has been treated for vitamin D deficiency. She indicates M. Duran decreased her Crestor from 61m to 163mbut she does not remember when or why this occurred. Her leg pain has resolved.   Past Medical History:  Diagnosis Date   Chronic diastolic CHF (congestive heart failure) (HCC)    Coronary artery disease    a. 4v CABG  (  LIMA to LAD, SVG to subbranch of ramus intermediate, SVG to lateral subbranch of ramus intermediate, SVG to distal RCA) and mitral valve repair January 2011. b. Abnormal nuc 09/2016 s/p  drug eluting stent to the vein graft to the PDA, a drug eluting stent in the native Ramus intermediate branch and a drug eluting stent in the protected left main. c. DES to ramus intermediate  02/2017.   Diabetes mellitus    H/O emotional problems    Headache, migraine    High cholesterol    History of blood transfusion    Hypertension    MI, old    MRSA (methicillin resistant Staphylococcus aureus)    MVP (mitral valve prolapse)    Neuromuscular disorder (HCC)    Neuropathy    S/P mitral valve repair    Sebaceous cyst     Past Surgical History:  Procedure Laterality Date   ABDOMINAL HYSTERECTOMY     CARDIAC CATHETERIZATION  09/22/2016   CORONARY ARTERY BYPASS GRAFT  with valve repair   CORONARY STENT INTERVENTION N/A 09/22/2016   Procedure: Coronary Stent Intervention;  Surgeon: Burnell Blanks, MD;  Location: [facility];  Service: Cardiovascular;  Laterality: N/A;   CORONARY STENT INTERVENTION N/A 02/07/2017   Procedure: CORONARY STENT INTERVENTION;  Surgeon: Martinique, Peter M, MD;  Location: [facility];  Service: Cardiovascular;  Laterality: N/A;   CORONARY STENT PLACEMENT  09/22/2016   LEFT HEART CATH AND CORS/GRAFTS ANGIOGRAPHY N/A 09/22/2016   Procedure: Left Heart Cath and Cors/Grafts Angiography;  Surgeon: Burnell Blanks, MD;  Location: [facility];  Service: Cardiovascular;  Laterality: N/A;   LEFT HEART CATH AND CORS/GRAFTS ANGIOGRAPHY N/A 02/07/2017   Procedure: LEFT HEART CATH AND CORS/GRAFTS ANGIOGRAPHY;  Surgeon: Martinique, Peter M, MD;  Location: [facility];  Service: Cardiovascular;  Laterality: N/A;   SKIN GRAFT      Current Medications: Current Meds  Medication Sig   aspirin 81 MG tablet Take 81 mg by mouth daily.    carvedilol (COREG) 3.125 MG tablet TAKE 1 TABLET BY MOUTH 2 TIMES DAILY.   clopidogrel (PLAVIX) 75 MG tablet TAKE 1 TABLET (75 MG TOTAL) BY MOUTH DAILY WITH BREAKFAST.   ergocalciferol (VITAMIN D2) 1.25 MG (50000 UT) capsule Take 50,000 Units by mouth once a week.   famotidine (PEPCID) 20 MG tablet TAKE 2 (TWO) TABLET AT BEDTIME   fluticasone (FLONASE) 50 MCG/ACT nasal  spray Place 1 spray into both nostrils 2 (two) times daily.    furosemide (LASIX) 20 MG tablet TAKE 1 TABLET BY MOUTH DAILY AS NEEDED FOR SWELLING   gabapentin (NEURONTIN) 300 MG capsule TAKE 1 CAPSULE BY MOUTH THREE TIMES A DAY   insulin aspart (NOVOLOG FLEXPEN) 100 UNIT/ML injection Inject 0-12 Units into the skin See admin instructions. Per sliding scale Under 150 no units 150-199 take 8 units 200-249 take 10 units 250-299 take 12 units   JARDIANCE 10 MG TABS tablet TAKE ONE TABLET BY MOUTH EACH MORNING BEFORE BREAKFAST (ICD 10 E11.65)   Lancets (ONETOUCH DELICA PLUS [id number]) MISC USE TO CHECK BLOOD SUGARS 3 TIMES A DAY (NEED PART B CARD)   lisinopril (PRINIVIL,ZESTRIL) 5 MG tablet TAKE 1 TABLET (5 MG TOTAL) DAILY BY MOUTH.   LYRICA CR 165 MG TB24 Take 165 mg daily by mouth.    metFORMIN (GLUCOPHAGE-XR) 500 MG 24 hr tablet TAKE 1 TABLET BY MOUTH TWICE A DAY AFTER MEALS   montelukast (SINGULAIR) 10 MG tablet Take 10 mg  by mouth at bedtime.   Naloxone HCl (EVZIO) 0.4 MG/0.4ML SOAJ Inject 0.4 mg as directed as needed (for possible overdose).    NARCAN 4 MG/0.1ML LIQD nasal spray kit    ondansetron (ZOFRAN-ODT) 8 MG disintegrating tablet Take 8 mg by mouth every 8 (eight) hours as needed for nausea or vomiting.   ONETOUCH VERIO test strip USE TO CHECK BLOOD SUGAR 3 TIMES A DAY (NEED PART B CARD)   oxyCODONE-acetaminophen (PERCOCET) 10-325 MG per tablet Take 1 tablet by mouth 5 (five) times daily.    rosuvastatin (CRESTOR) 10 MG tablet Take 10 mg by mouth daily.   Semaglutide 1 MG/DOSE SOPN Inject 1 mg once a week into the skin.   topiramate (TOPAMAX) 50 MG tablet TAKE 1 TABLET BY MOUTH EVERYDAY AT BEDTIME   TRESIBA FLEXTOUCH 100 UNIT/ML SOPN FlexTouch Pen Inject 40 Units as directed at bedtime.    zolpidem (AMBIEN) 5 MG tablet Take 1 tablet (5 mg total) by mouth at bedtime as needed. for sleep   [DISCONTINUED] nitroGLYCERIN (NITROSTAT) 0.4 MG SL tablet Place 1 tablet (0.4  mg total) under the tongue every 5 (five) minutes as needed for chest pain.     Allergies:   Metformin   Social History   Socioeconomic History   Marital status: Divorced    Spouse name: Not on file   Number of children: 2   Years of education: Not on file   Highest education level: Not on file  Occupational History   Occupation: Disability  Social Needs   Financial resource strain: Not on file   Food insecurity    Worry: Not on file    Inability: Not on file   Transportation needs    Medical: No    Non-medical: No  Tobacco Use   Smoking status: Former Smoker    Packs/day: 1.00    Years: 28.00    Pack years: 28.00    Types: Cigarettes    Quit date: 03/31/2009    Years since quitting: 9.6   Smokeless tobacco: Never Used  Substance and Sexual Activity   Alcohol use: No   Drug use: No   Sexual activity: Yes    Birth control/protection: Surgical    Comment: partial hysterectomy  Lifestyle   Physical activity    Days per week: 0 days    Minutes per session: 0 min   Stress: Only a little  Relationships   Press photographer on phone: Not on file    Gets together: Not on file    Attends religious service: Not on file    Active member of club or organization: Not on file    Attends meetings of clubs or organizations: Not on file    Relationship status: Not on file  Other Topics Concern   Not on file  Social History Narrative   Not on file     Family History:  The patient's family history includes Arthritis in her father; Asthma in her father; Diabetes in her brother, father, and mother; Emphysema in her maternal grandmother; Heart disease in her father and mother; Hypertension in her brother, father, and mother; Migraines in her mother; Stroke in her mother.  ROS:   Please see the history of present illness.  All other systems are reviewed and otherwise negative.    EKGs/Labs/Other Studies Reviewed:    Studies reviewed were  summarized above.   EKG:  EKG is ordered today.  The EKG ordered today demonstrates NSR 60bpm, cannot  r/o prior anterior infarct with poor R wave progression, nonspecific TW changes I, avL. No significant change from prior.  Recent Labs: No results found for requested labs within last 8760 hours.  Recent Lipid Panel    Component Value Date/Time   CHOL 126 04/12/2017 0842   TRIG 164 (H) 04/12/2017 0842   HDL 32 (L) 04/12/2017 0842   CHOLHDL 3.9 04/12/2017 0842   CHOLHDL 8.9 04/03/2009 0320   VLDL 54 (H) 04/03/2009 0320   LDLCALC 61 04/12/2017 0842    PHYSICAL EXAM:    VS:  BP 122/74    Pulse 66    Ht 5' 4"  (1.626 m)    Wt 196 lb 6.4 oz (89.1 kg)    SpO2 97%    BMI 33.71 kg/m   BMI: Body mass index is 33.71 kg/m.  GEN: Well nourished, well developed WF, in no acute distress HEENT: normocephalic, atraumatic Neck: no JVD, carotid bruits, or masses Cardiac: RRR; no murmurs, rubs, or gallops, no edema  Respiratory:  clear to auscultation bilaterally, normal work of breathing GI: soft, nontender, nondistended, + BS MS: no atrophy, prior well healed deformity of chest wall where breast tissue surrounds sternal scar Skin: warm and dry, no rash Neuro:  Alert and Oriented x 3, Strength and sensation are intact, follows commands Psych: euthymic mood, full affect  Wt Readings from Last 3 Encounters:  11/14/18 196 lb 6.4 oz (89.1 kg)  04/03/18 191 lb (86.6 kg)  03/18/18 199 lb 12.8 oz (90.6 kg)     ASSESSMENT & PLAN:   1. CAD - doing well without progressive angina. Continue ASA, Plavix, BB, statin. Will check lipids today. Will ask MA to reach out to PCP's office to clarify the question of why Crestor was reduced. It will be very important to maintain the maximum dose of statin that she is able to tolerate, with goal LDL <70. If suboptimal, will need to consider adjunctive therapy. 2. Mitral valve repair with mild mitral stenosis - doubt that this is related to her generalized fatigue,  but given mild mitral stenosis on last echo, she is due for repeat echo for surveillance of mitral stenosis. Will arrange. SBE ppx reviewed. She declined need for amoxicillin from our office, citing that her dentist takes care of rx. 3. Chronic diastolic CHF - appears euvolemic on exam. 4. Hyperlipidemia - check CMET/lipids today. See above regarding statin. 5. Essential HTN - controlled. 6. Fatigue, unspecified - by general description, I worry about possibility of sleep apnea. Will arrange sleep study. Will also check CBC and TSH with labs. She also has ongoing f/u with primary care for this.  Disposition: F/u with Dr. Angelena Form in 6 months.  Medication Adjustments/Labs and Tests Ordered: Current medicines are reviewed at length with the patient today.  Concerns regarding medicines are outlined above. Medication changes, Labs and Tests ordered today are summarized above and listed in the Patient Instructions accessible in Encounters.   Signed, Charlie Pitter, PA-C  11/14/2018 12:06 PM    [facility] [address], [place], [place]  [address] Phone: [phone]; Fax: [phone]

## 2018-11-14 ENCOUNTER — Encounter (INDEPENDENT_AMBULATORY_CARE_PROVIDER_SITE_OTHER): Payer: Self-pay

## 2018-11-14 ENCOUNTER — Encounter: Payer: Self-pay | Admitting: Physician Assistant

## 2018-11-14 ENCOUNTER — Ambulatory Visit (INDEPENDENT_AMBULATORY_CARE_PROVIDER_SITE_OTHER): Payer: Medicare Other | Admitting: Physician Assistant

## 2018-11-14 ENCOUNTER — Telehealth: Payer: Self-pay | Admitting: Physician Assistant

## 2018-11-14 ENCOUNTER — Telehealth: Payer: Self-pay | Admitting: *Deleted

## 2018-11-14 ENCOUNTER — Other Ambulatory Visit: Payer: Self-pay

## 2018-11-14 VITALS — BP 122/74 | HR 66 | Ht 64.0 in | Wt 196.4 lb

## 2018-11-14 DIAGNOSIS — Z9889 Other specified postprocedural states: Secondary | ICD-10-CM

## 2018-11-14 DIAGNOSIS — I050 Rheumatic mitral stenosis: Secondary | ICD-10-CM | POA: Diagnosis not present

## 2018-11-14 DIAGNOSIS — I2510 Atherosclerotic heart disease of native coronary artery without angina pectoris: Secondary | ICD-10-CM | POA: Diagnosis not present

## 2018-11-14 DIAGNOSIS — R5383 Other fatigue: Secondary | ICD-10-CM

## 2018-11-14 DIAGNOSIS — I10 Essential (primary) hypertension: Secondary | ICD-10-CM

## 2018-11-14 DIAGNOSIS — E785 Hyperlipidemia, unspecified: Secondary | ICD-10-CM

## 2018-11-14 DIAGNOSIS — I5032 Chronic diastolic (congestive) heart failure: Secondary | ICD-10-CM

## 2018-11-14 MED ORDER — NITROGLYCERIN 0.4 MG SL SUBL: 0.4000 mg | SUBLINGUAL_TABLET | SUBLINGUAL | 3 refills | Status: DC | PRN

## 2018-11-14 NOTE — Telephone Encounter (Signed)
[facility], they advise they don't prescribe this medication for this pt, she only sees Bubba Hales, [place] for weight loss. Contacted pt's pharmacy, and they don't have a 10 mg Crestor on file for pt at all, only 40 mg.

## 2018-11-14 NOTE — Patient Instructions (Addendum)
Medication Instructions:  Your physician recommends that you continue on your current medications as directed. Please refer to the Current Medication list given to you today.  If you need a refill on your cardiac medications before your next appointment, please call your pharmacy.   Lab work: TODAY:  CMET, CBC, TSH, & LIPID  If you have labs (blood work) drawn today and your tests are completely normal, you will receive your results only by: Marland Kitchen MyChart Message (if you have MyChart) OR . A paper copy in the mail If you have any lab test that is abnormal or we need to change your treatment, we will call you to review the results.  Testing/Procedures: Your physician has requested that you have an echocardiogram. 11/20/2018 ARRIVE AT 2:50 P.M.  Echocardiography is a painless test that uses sound waves to create images of your heart. It provides your doctor with information about the size and shape of your heart and how well your heart's chambers and valves are working. This procedure takes approximately one hour. There are no restrictions for this procedure.   Your physician has recommended that you have a sleep study. SOMEONE WILL CONTACT YOU ABOUT THIS  This test records several body functions during sleep, including: brain activity, eye movement, oxygen and carbon dioxide blood levels, heart rate and rhythm, breathing rate and rhythm, the flow of air through your mouth and nose, snoring, body muscle movements, and chest and belly movement.   Follow-Up: At [facility], you and your health needs are our priority.  As part of our continuing mission to provide you with exceptional heart care, we have created designated Provider Care Teams.  These Care Teams include your primary Cardiologist (physician) and Advanced Practice Providers (APPs -  Physician Assistants and Nurse Practitioners) who all work together to provide you with the care you need, when you need it. You will need a follow up  appointment in 6 months.  Please call our office 2 months in advance to schedule this appointment.  You may see Lauree Chandler, MD or one of the following Advanced Practice Providers on your designated Care Team:   [place], PA-C Melina Copa, PA-C . Ermalinda Barrios, PA-C  Any Other Special Instructions Will Be Listed Below (If Applicable).  Echocardiogram An echocardiogram is a procedure that uses painless sound waves (ultrasound) to produce an image of the heart. Images from an echocardiogram can provide important information about:  Signs of coronary artery disease (CAD).  Aneurysm detection. An aneurysm is a weak or damaged part of an artery wall that bulges out from the normal force of blood pumping through the body.  Heart size and shape. Changes in the size or shape of the heart can be associated with certain conditions, including heart failure, aneurysm, and CAD.  Heart muscle function.  Heart valve function.  Signs of a past heart attack.  Fluid buildup around the heart.  Thickening of the heart muscle.  A tumor or infectious growth around the heart valves. Tell a health care provider about:  Any allergies you have.  All medicines you are taking, including vitamins, herbs, eye drops, creams, and over-the-counter medicines.  Any blood disorders you have.  Any surgeries you have had.  Any medical conditions you have.  Whether you are pregnant or may be pregnant. What are the risks? Generally, this is a safe procedure. However, problems may occur, including:  Allergic reaction to dye (contrast) that may be used during the procedure. What happens before the  procedure? No specific preparation is needed. You may eat and drink normally. What happens during the procedure?   An IV tube may be inserted into one of your veins.  You may receive contrast through this tube. A contrast is an injection that improves the quality of the pictures from your  heart.  A gel will be applied to your chest.  A wand-like tool (transducer) will be moved over your chest. The gel will help to transmit the sound waves from the transducer.  The sound waves will harmlessly bounce off of your heart to allow the heart images to be captured in real-time motion. The images will be recorded on a computer. The procedure may vary among health care providers and hospitals. What happens after the procedure?  You may return to your normal, everyday life, including diet, activities, and medicines, unless your health care provider tells you not to do that. Summary  An echocardiogram is a procedure that uses painless sound waves (ultrasound) to produce an image of the heart.  Images from an echocardiogram can provide important information about the size and shape of your heart, heart muscle function, heart valve function, and fluid buildup around your heart.  You do not need to do anything to prepare before this procedure. You may eat and drink normally.  After the echocardiogram is completed, you may return to your normal, everyday life, unless your health care provider tells you not to do that. This information is not intended to replace advice given to you by your health care provider. Make sure you discuss any questions you have with your health care provider. Document Released: 03/17/2000 Document Revised: 07/11/2018 Document Reviewed: 04/22/2016 Elsevier Patient Education  2020 [place].    Endocarditis Information  You may be at risk for developing endocarditis since you have a repaired heart valve. Endocarditis is an infection of the lining of the heart or heart valves. Certain surgical and dental procedures may put you at risk,  such as teeth cleaning or other dental procedures or any surgery involving the respiratory, urinary, gastrointestinal tract, gallbladder or prostate. Notify your doctor or dentist before having any invasive procedures. You will  need to take antibiotics before certain procedures. To prevent endocarditis, maintain good oral health. Seek prompt medical attention for any mouth/gum, skin or urinary tract infections.

## 2018-11-14 NOTE — Telephone Encounter (Signed)
Staff message sent to Nina ok to schedule sleep study. Patient has Medicare and does not require a PA. 

## 2018-11-14 NOTE — Telephone Encounter (Signed)
Ok, we will address when we get labs back.

## 2018-11-14 NOTE — Telephone Encounter (Signed)
   Pt seen in clinic today, indicates primary care Isaias Cowman (PA) reduced Crestor from 40mg  to 10mg  but she does not remember when or why. We are checking labs today to see where things are at, but can you please reach out to his office to see if they can clarify these questions for [place]? Thanks!

## 2018-11-15 ENCOUNTER — Telehealth: Payer: Self-pay

## 2018-11-15 ENCOUNTER — Telehealth: Payer: Self-pay | Admitting: *Deleted

## 2018-11-15 DIAGNOSIS — E781 Pure hyperglyceridemia: Secondary | ICD-10-CM

## 2018-11-15 LAB — COMPREHENSIVE METABOLIC PANEL
ALT: 15 IU/L (ref 0–32)
AST: 17 IU/L (ref 0–40)
Albumin/Globulin Ratio: 1.7 (ref 1.2–2.2)
Albumin: 4.9 g/dL — ABNORMAL HIGH (ref 3.8–4.8)
Alkaline Phosphatase: 60 IU/L (ref 39–117)
BUN/Creatinine Ratio: 15 (ref 9–23)
BUN: 19 mg/dL (ref 6–24)
Bilirubin Total: 0.6 mg/dL (ref 0.0–1.2)
CO2: 23 mmol/L (ref 20–29)
Calcium: 10.8 mg/dL — ABNORMAL HIGH (ref 8.7–10.2)
Chloride: 97 mmol/L (ref 96–106)
Creatinine, Ser: 1.28 mg/dL — ABNORMAL HIGH (ref 0.57–1.00)
GFR calc Af Amer: 57 mL/min/{1.73_m2} — ABNORMAL LOW (ref >59)
GFR calc non Af Amer: 49 mL/min/{1.73_m2} — ABNORMAL LOW (ref >59)
Globulin, Total: 2.9 g/dL (ref 1.5–4.5)
Glucose: 172 mg/dL — ABNORMAL HIGH (ref 65–99)
Potassium: 5.1 mmol/L (ref 3.5–5.2)
Sodium: 138 mmol/L (ref 134–144)
Total Protein: 7.8 g/dL (ref 6.0–8.5)

## 2018-11-15 LAB — CBC
Hematocrit: 47.5 % — ABNORMAL HIGH (ref 34.0–46.6)
Hemoglobin: 15.4 g/dL (ref 11.1–15.9)
MCH: 30.7 pg (ref 26.6–33.0)
MCHC: 32.4 g/dL (ref 31.5–35.7)
MCV: 95 fL (ref 79–97)
Platelets: 294 10*3/uL (ref 150–450)
RBC: 5.02 x10E6/uL (ref 3.77–5.28)
RDW: 12.9 % (ref 11.7–15.4)
WBC: 10.9 10*3/uL — ABNORMAL HIGH (ref 3.4–10.8)

## 2018-11-15 LAB — LIPID PANEL
Chol/HDL Ratio: 4.1 ratio (ref 0.0–4.4)
Cholesterol, Total: 148 mg/dL (ref 100–199)
HDL: 36 mg/dL — ABNORMAL LOW (ref >39)
LDL Calculated: 55 mg/dL (ref 0–99)
Triglycerides: 285 mg/dL — ABNORMAL HIGH (ref 0–149)
VLDL Cholesterol Cal: 57 mg/dL — ABNORMAL HIGH (ref 5–40)

## 2018-11-15 LAB — TSH: TSH: 1.82 u[IU]/mL (ref 0.450–4.500)

## 2018-11-15 NOTE — Telephone Encounter (Signed)
Follow up   Patient has additional questions about her lab results. Please call.

## 2018-11-15 NOTE — Telephone Encounter (Signed)
Spoke with the patient in depth regarding her recent lab work and answered all her questions. Pt is aware to follow up with her PCP and states that she has an appt next week with him.

## 2018-11-15 NOTE — Telephone Encounter (Signed)
Pt called re: lab results.

## 2018-11-15 NOTE — Telephone Encounter (Signed)
-----   Message from Charlie Pitter, [place] sent at 11/15/2018  8:05 AM EDT ----- Elizabeth Park to add below - if patient is taking any NSAIDS, stop them (ibuprofen, Advil, Motrin, naproxen, and Aleve for example)

## 2018-11-18 ENCOUNTER — Other Ambulatory Visit (HOSPITAL_COMMUNITY): Payer: Medicare Other

## 2018-11-18 NOTE — Telephone Encounter (Signed)
Returned pts call.  Left a message to call back.

## 2018-11-19 ENCOUNTER — Telehealth: Payer: Self-pay | Admitting: *Deleted

## 2018-11-19 NOTE — Telephone Encounter (Signed)
Patient is scheduled for lab study on 11/26/18. Patient understands her sleep study will be done at [facility] sleep lab. Patient understands she will receive a sleep packet in a week or so. Patient understands to call if she does not receive the sleep packet in a timely manner. She is scheduled for COVID screening on 11/23/18 prior to her study.   Patient agrees with treatment and thanked me for call.

## 2018-11-19 NOTE — Telephone Encounter (Signed)
-----   Message from Lauralee Evener, [place] sent at 11/14/2018 12:22 PM EDT ----- Regarding: RE: [place] to schedule sleep study. Patient has Medicare and does not require a PA. ----- Message ----- From: Jeanann Lewandowsky, RMA Sent: 11/14/2018  12:12 PM EDT To: Cv Div Sleep Studies Subject: SLEEP STUDY                                    Pt needs sleep study. Thanks!

## 2018-11-20 ENCOUNTER — Ambulatory Visit (HOSPITAL_COMMUNITY): Payer: Medicare Other | Attending: Cardiovascular Disease

## 2018-11-20 ENCOUNTER — Other Ambulatory Visit: Payer: Self-pay

## 2018-11-20 ENCOUNTER — Ambulatory Visit
Admission: RE | Admit: 2018-11-20 | Discharge: 2018-11-20 | Disposition: A | Discharge status: Home / Self Care | Payer: Medicare Other | Source: Ambulatory Visit | Attending: Cardiology | Admitting: Cardiology

## 2018-11-20 ENCOUNTER — Other Ambulatory Visit (HOSPITAL_COMMUNITY): Payer: Self-pay | Admitting: Physician Assistant

## 2018-11-20 DIAGNOSIS — I050 Rheumatic mitral stenosis: Secondary | ICD-10-CM

## 2018-11-20 DIAGNOSIS — Z9889 Other specified postprocedural states: Secondary | ICD-10-CM

## 2018-11-20 DIAGNOSIS — I5032 Chronic diastolic (congestive) heart failure: Secondary | ICD-10-CM | POA: Diagnosis not present

## 2018-11-20 DIAGNOSIS — I2510 Atherosclerotic heart disease of native coronary artery without angina pectoris: Secondary | ICD-10-CM | POA: Diagnosis not present

## 2018-11-20 DIAGNOSIS — E785 Hyperlipidemia, unspecified: Secondary | ICD-10-CM | POA: Diagnosis not present

## 2018-11-20 DIAGNOSIS — R5383 Other fatigue: Secondary | ICD-10-CM | POA: Diagnosis not present

## 2018-11-20 DIAGNOSIS — Z1231 Encounter for screening mammogram for malignant neoplasm of breast: Secondary | ICD-10-CM

## 2018-11-20 DIAGNOSIS — I10 Essential (primary) hypertension: Secondary | ICD-10-CM | POA: Diagnosis not present

## 2018-11-21 DIAGNOSIS — M545 Low back pain: Secondary | ICD-10-CM | POA: Diagnosis not present

## 2018-11-21 DIAGNOSIS — M79661 Pain in right lower leg: Secondary | ICD-10-CM | POA: Diagnosis not present

## 2018-11-21 DIAGNOSIS — M79662 Pain in left lower leg: Secondary | ICD-10-CM | POA: Diagnosis not present

## 2018-11-21 DIAGNOSIS — G894 Chronic pain syndrome: Secondary | ICD-10-CM | POA: Diagnosis not present

## 2018-11-21 NOTE — Telephone Encounter (Signed)
Spoke to pt this morning re: echo results.  Asked if she had anymore questions re: her lab results, and pt states she has already had them answered.  Pt grateful for the follow-up.

## 2018-11-22 DIAGNOSIS — I2510 Atherosclerotic heart disease of native coronary artery without angina pectoris: Secondary | ICD-10-CM | POA: Diagnosis not present

## 2018-11-22 DIAGNOSIS — E119 Type 2 diabetes mellitus without complications: Secondary | ICD-10-CM | POA: Diagnosis not present

## 2018-11-22 DIAGNOSIS — Z951 Presence of aortocoronary bypass graft: Secondary | ICD-10-CM | POA: Diagnosis not present

## 2018-11-22 DIAGNOSIS — I10 Essential (primary) hypertension: Secondary | ICD-10-CM | POA: Diagnosis not present

## 2018-11-23 ENCOUNTER — Other Ambulatory Visit (HOSPITAL_COMMUNITY): Admission: RE | Admit: 2018-11-23 | Payer: Medicare Other | Source: Ambulatory Visit

## 2018-11-26 ENCOUNTER — Encounter (HOSPITAL_BASED_OUTPATIENT_CLINIC_OR_DEPARTMENT_OTHER): Payer: Medicare Other | Admitting: Cardiology

## 2018-11-29 ENCOUNTER — Other Ambulatory Visit: Payer: Self-pay | Admitting: Physician Assistant

## 2018-12-18 DIAGNOSIS — N898 Other specified noninflammatory disorders of vagina: Secondary | ICD-10-CM | POA: Diagnosis not present

## 2018-12-18 DIAGNOSIS — E782 Mixed hyperlipidemia: Secondary | ICD-10-CM | POA: Diagnosis not present

## 2018-12-18 DIAGNOSIS — I10 Essential (primary) hypertension: Secondary | ICD-10-CM | POA: Diagnosis not present

## 2018-12-18 DIAGNOSIS — E119 Type 2 diabetes mellitus without complications: Secondary | ICD-10-CM | POA: Diagnosis not present

## 2018-12-18 DIAGNOSIS — Z0000 Encounter for general adult medical examination without abnormal findings: Secondary | ICD-10-CM | POA: Diagnosis not present

## 2018-12-18 DIAGNOSIS — Z124 Encounter for screening for malignant neoplasm of cervix: Secondary | ICD-10-CM | POA: Diagnosis not present

## 2018-12-20 DIAGNOSIS — M79661 Pain in right lower leg: Secondary | ICD-10-CM | POA: Diagnosis not present

## 2018-12-20 DIAGNOSIS — M545 Low back pain: Secondary | ICD-10-CM | POA: Diagnosis not present

## 2018-12-20 DIAGNOSIS — M79662 Pain in left lower leg: Secondary | ICD-10-CM | POA: Diagnosis not present

## 2018-12-20 DIAGNOSIS — G894 Chronic pain syndrome: Secondary | ICD-10-CM | POA: Diagnosis not present

## 2018-12-23 ENCOUNTER — Other Ambulatory Visit: Payer: Self-pay | Admitting: Cardiovascular Disease

## 2019-01-17 DIAGNOSIS — G894 Chronic pain syndrome: Secondary | ICD-10-CM | POA: Diagnosis not present

## 2019-01-17 DIAGNOSIS — M79662 Pain in left lower leg: Secondary | ICD-10-CM | POA: Diagnosis not present

## 2019-01-17 DIAGNOSIS — M545 Low back pain: Secondary | ICD-10-CM | POA: Diagnosis not present

## 2019-01-17 DIAGNOSIS — M79661 Pain in right lower leg: Secondary | ICD-10-CM | POA: Diagnosis not present

## 2019-01-27 ENCOUNTER — Other Ambulatory Visit: Payer: Self-pay | Admitting: Cardiovascular Disease

## 2019-01-30 NOTE — Telephone Encounter (Signed)
OK to refill.   Elizabeth Park  

## 2019-01-30 NOTE — Telephone Encounter (Signed)
Pt's pharmacy is requesting a refill on zolpidem. Would Dr. Mcalhany like to refill this medication? Please address 

## 2019-02-04 DIAGNOSIS — L723 Sebaceous cyst: Secondary | ICD-10-CM | POA: Diagnosis not present

## 2019-02-04 DIAGNOSIS — N9089 Other specified noninflammatory disorders of vulva and perineum: Secondary | ICD-10-CM | POA: Diagnosis not present

## 2019-02-05 DIAGNOSIS — E118 Type 2 diabetes mellitus with unspecified complications: Secondary | ICD-10-CM | POA: Diagnosis not present

## 2019-02-05 DIAGNOSIS — Z951 Presence of aortocoronary bypass graft: Secondary | ICD-10-CM | POA: Diagnosis not present

## 2019-02-05 DIAGNOSIS — I10 Essential (primary) hypertension: Secondary | ICD-10-CM | POA: Diagnosis not present

## 2019-02-05 DIAGNOSIS — E1140 Type 2 diabetes mellitus with diabetic neuropathy, unspecified: Secondary | ICD-10-CM | POA: Diagnosis not present

## 2019-02-14 DIAGNOSIS — M79661 Pain in right lower leg: Secondary | ICD-10-CM | POA: Diagnosis not present

## 2019-02-14 DIAGNOSIS — G894 Chronic pain syndrome: Secondary | ICD-10-CM | POA: Diagnosis not present

## 2019-02-14 DIAGNOSIS — M79662 Pain in left lower leg: Secondary | ICD-10-CM | POA: Diagnosis not present

## 2019-02-14 DIAGNOSIS — M545 Low back pain: Secondary | ICD-10-CM | POA: Diagnosis not present

## 2019-02-21 DIAGNOSIS — I10 Essential (primary) hypertension: Secondary | ICD-10-CM | POA: Diagnosis not present

## 2019-02-21 DIAGNOSIS — I2510 Atherosclerotic heart disease of native coronary artery without angina pectoris: Secondary | ICD-10-CM | POA: Diagnosis not present

## 2019-02-21 DIAGNOSIS — Z79899 Other long term (current) drug therapy: Secondary | ICD-10-CM | POA: Diagnosis not present

## 2019-02-21 DIAGNOSIS — E559 Vitamin D deficiency, unspecified: Secondary | ICD-10-CM | POA: Diagnosis not present

## 2019-02-21 DIAGNOSIS — E119 Type 2 diabetes mellitus without complications: Secondary | ICD-10-CM | POA: Diagnosis not present

## 2019-02-21 DIAGNOSIS — E782 Mixed hyperlipidemia: Secondary | ICD-10-CM | POA: Diagnosis not present

## 2019-03-07 DIAGNOSIS — E119 Type 2 diabetes mellitus without complications: Secondary | ICD-10-CM | POA: Diagnosis not present

## 2019-03-07 DIAGNOSIS — E782 Mixed hyperlipidemia: Secondary | ICD-10-CM | POA: Diagnosis not present

## 2019-03-07 DIAGNOSIS — Z951 Presence of aortocoronary bypass graft: Secondary | ICD-10-CM | POA: Diagnosis not present

## 2019-03-07 DIAGNOSIS — Z9889 Other specified postprocedural states: Secondary | ICD-10-CM | POA: Diagnosis not present

## 2019-03-14 ENCOUNTER — Telehealth: Payer: Self-pay | Admitting: Cardiovascular Disease

## 2019-03-14 NOTE — Telephone Encounter (Signed)
Left message for patient that I will call her back with plan for appointment in February.

## 2019-03-14 NOTE — Telephone Encounter (Signed)
New message   Patient calling, verbalizing her anger. states she just got a letter in the mail today to schedule (recall) an appointment with Dr Elizabeth Park No appointments available, declined to see APP Scheduler offered waitlist. Patient wants accomodation made to see MD,requesting call back with appointment

## 2019-03-17 NOTE — Telephone Encounter (Signed)
Appointment made  Pt aware °

## 2019-03-20 ENCOUNTER — Other Ambulatory Visit: Payer: Self-pay | Admitting: Physician Assistant

## 2019-03-29 ENCOUNTER — Other Ambulatory Visit: Payer: Self-pay | Admitting: Physician Assistant

## 2019-04-12 DIAGNOSIS — Z20828 Contact with and (suspected) exposure to other viral communicable diseases: Secondary | ICD-10-CM | POA: Diagnosis not present

## 2019-05-02 DIAGNOSIS — Z79899 Other long term (current) drug therapy: Secondary | ICD-10-CM | POA: Diagnosis not present

## 2019-05-02 DIAGNOSIS — R519 Headache, unspecified: Secondary | ICD-10-CM | POA: Diagnosis not present

## 2019-05-02 DIAGNOSIS — M79604 Pain in right leg: Secondary | ICD-10-CM | POA: Diagnosis not present

## 2019-05-02 DIAGNOSIS — M79605 Pain in left leg: Secondary | ICD-10-CM | POA: Diagnosis not present

## 2019-05-02 DIAGNOSIS — R0989 Other specified symptoms and signs involving the circulatory and respiratory systems: Secondary | ICD-10-CM | POA: Diagnosis not present

## 2019-05-13 DIAGNOSIS — M79661 Pain in right lower leg: Secondary | ICD-10-CM | POA: Diagnosis not present

## 2019-05-13 DIAGNOSIS — M79662 Pain in left lower leg: Secondary | ICD-10-CM | POA: Diagnosis not present

## 2019-05-13 DIAGNOSIS — G894 Chronic pain syndrome: Secondary | ICD-10-CM | POA: Diagnosis not present

## 2019-05-13 DIAGNOSIS — M545 Low back pain: Secondary | ICD-10-CM | POA: Diagnosis not present

## 2019-05-15 DIAGNOSIS — R519 Headache, unspecified: Secondary | ICD-10-CM | POA: Diagnosis not present

## 2019-05-16 DIAGNOSIS — R7989 Other specified abnormal findings of blood chemistry: Secondary | ICD-10-CM | POA: Diagnosis not present

## 2019-05-23 DIAGNOSIS — I10 Essential (primary) hypertension: Secondary | ICD-10-CM | POA: Diagnosis not present

## 2019-05-23 DIAGNOSIS — E119 Type 2 diabetes mellitus without complications: Secondary | ICD-10-CM | POA: Diagnosis not present

## 2019-05-23 DIAGNOSIS — M542 Cervicalgia: Secondary | ICD-10-CM | POA: Diagnosis not present

## 2019-05-23 DIAGNOSIS — M79605 Pain in left leg: Secondary | ICD-10-CM | POA: Diagnosis not present

## 2019-05-23 DIAGNOSIS — R0989 Other specified symptoms and signs involving the circulatory and respiratory systems: Secondary | ICD-10-CM | POA: Diagnosis not present

## 2019-05-23 DIAGNOSIS — R519 Headache, unspecified: Secondary | ICD-10-CM | POA: Diagnosis not present

## 2019-05-23 DIAGNOSIS — M79604 Pain in right leg: Secondary | ICD-10-CM | POA: Diagnosis not present

## 2019-05-29 ENCOUNTER — Encounter: Payer: Self-pay | Admitting: Cardiovascular Disease

## 2019-05-29 ENCOUNTER — Ambulatory Visit (INDEPENDENT_AMBULATORY_CARE_PROVIDER_SITE_OTHER): Payer: Medicare Other | Admitting: Cardiovascular Disease

## 2019-05-29 ENCOUNTER — Other Ambulatory Visit: Payer: Self-pay

## 2019-05-29 VITALS — BP 130/84 | HR 65 | Ht 64.0 in | Wt 204.0 lb

## 2019-05-29 DIAGNOSIS — I25810 Atherosclerosis of coronary artery bypass graft(s) without angina pectoris: Secondary | ICD-10-CM

## 2019-05-29 DIAGNOSIS — I10 Essential (primary) hypertension: Secondary | ICD-10-CM

## 2019-05-29 DIAGNOSIS — I050 Rheumatic mitral stenosis: Secondary | ICD-10-CM | POA: Diagnosis not present

## 2019-05-29 DIAGNOSIS — I5032 Chronic diastolic (congestive) heart failure: Secondary | ICD-10-CM

## 2019-05-29 DIAGNOSIS — K219 Gastro-esophageal reflux disease without esophagitis: Secondary | ICD-10-CM | POA: Diagnosis not present

## 2019-05-29 DIAGNOSIS — E785 Hyperlipidemia, unspecified: Secondary | ICD-10-CM

## 2019-05-29 MED ORDER — PANTOPRAZOLE SODIUM 40 MG PO TBEC: 40.0000 mg | DELAYED_RELEASE_TABLET | Freq: Every day | ORAL | 11 refills | Status: AC

## 2019-05-29 NOTE — Patient Instructions (Addendum)
Medication Instructions:  Your physician has recommended you make the following change in your medication:  1.) stop pepcid 2.) start Protonix (pantoprazole) 40 mg daily  *If you need a refill on your cardiac medications before your next appointment, please call your pharmacy*  Lab Work: none If you have labs (blood work) drawn today and your tests are completely normal, you will receive your results only by: Marland Kitchen MyChart Message (if you have MyChart) OR . A paper copy in the mail If you have any lab test that is abnormal or we need to change your treatment, we will call you to review the results.  Testing/Procedures: none  Follow-Up: At [facility], you and your health needs are our priority.  As part of our continuing mission to provide you with exceptional heart care, we have created designated Provider Care Teams.  These Care Teams include your primary Cardiologist (physician) and Advanced Practice Providers (APPs -  Physician Assistants and Nurse Practitioners) who all work together to provide you with the care you need, when you need it.  Your next appointment:   12 month(s)  The format for your next appointment:   Either In Person or Virtual  Provider:   You may see Lauree Chandler, MD or one of the following Advanced Practice Providers on your designated Care Team:    Melina Copa, PA-C  Ermalinda Barrios, PA-C   Other Instructions

## 2019-05-29 NOTE — Progress Notes (Signed)
Chief Complaint  Patient presents with  . Follow-up    CAD    History of Present Illness: 49 yo female with history of CAD s/p 4V CABG 2011, mitral regurgitation s/p repair, chronic diastolic CHF, DM, HTN and hyperlipidemia who is here today for cardiac follow up. Cardiac cath  In December 2010 showed severe proximal LAD stenosis, severe mid intermediate disease with occluded mid RCA and anomalous Circumflex with severe disease. She also had severe mitral regurgitation. She underwent a 4V CABG  (LIMA to LAD, SVG to subbranch of ramus intermediate, SVG to lateral subbranch of ramus intermediate, SVG to distal RCA) and mitral valve repair January 2011. Echo September 2016 with normal LV systolic function, stable mitral valve repair. She had chest pain in June 2018 and her nuclear stress test showed ischemia. Cardiac cath on 09/22/16 showed progression of her CAD. LV function was normal. I placed a drug eluting stent in the vein graft to the PDA, a drug eluting stent in the native Ramus intermediate branch and a drug eluting stent in the protected left main. Repeat cardiac cath 02/07/17 and found to have continued occlusion of all three native vessels with patent LIMA to LAD, occluded SVG to ramus x 2, patent SVG to PDA but severe restenosis in the ramus intermediate stent. This was treated with another drug eluting stent. Her BP was soft in the hospital so Norvasc was stopped.  Coreg dose lowered at office visit in February 2019 due to fatigue. Echo August 2020 with LVEF=60-65%, mitral valve repair intact with mild stenosis.   She is here today for follow up. The patient denies any chest pain, dyspnea, palpitations, lower extremity edema, orthopnea, PND, dizziness, near syncope or syncope. She has had some breakthrough heartburn on Pepcid.   Primary Care Physician: Gwendel Hanson  Past Medical History:  Diagnosis Date  . Chronic diastolic CHF (congestive heart failure) ([place])   . Coronary  artery disease    a. 4v CABG  (LIMA to LAD, SVG to subbranch of ramus intermediate, SVG to lateral subbranch of ramus intermediate, SVG to distal RCA) and mitral valve repair January 2011. b. Abnormal nuc 09/2016 s/p  drug eluting stent to the vein graft to the PDA, a drug eluting stent in the native Ramus intermediate branch and a drug eluting stent in the protected left main. c. DES to ramus intermediate 02/2017.  . Diabetes mellitus   . H/O emotional problems   . Headache, migraine   . High cholesterol   . History of blood transfusion   . Hypertension   . MI, old   . MRSA (methicillin resistant Staphylococcus aureus)   . MVP (mitral valve prolapse)   . Neuromuscular disorder ([place])   . Neuropathy   . S/P mitral valve repair   . Sebaceous cyst     Past Surgical History:  Procedure Laterality Date  . ABDOMINAL HYSTERECTOMY    . CARDIAC CATHETERIZATION  09/22/2016  . CORONARY ARTERY BYPASS GRAFT  with valve repair  . CORONARY STENT INTERVENTION N/A 09/22/2016   Procedure: Coronary Stent Intervention;  Surgeon: Burnell Blanks, MD;  Location: [facility];  Service: Cardiovascular;  Laterality: N/A;  . CORONARY STENT INTERVENTION N/A 02/07/2017   Procedure: CORONARY STENT INTERVENTION;  Surgeon: Martinique, Peter M, MD;  Location: [facility];  Service: Cardiovascular;  Laterality: N/A;  . CORONARY STENT PLACEMENT  09/22/2016  . LEFT HEART CATH AND CORS/GRAFTS ANGIOGRAPHY N/A 09/22/2016   Procedure: Left  Heart Cath and Cors/Grafts Angiography;  Surgeon: Burnell Blanks, MD;  Location: [facility];  Service: Cardiovascular;  Laterality: N/A;  . LEFT HEART CATH AND CORS/GRAFTS ANGIOGRAPHY N/A 02/07/2017   Procedure: LEFT HEART CATH AND CORS/GRAFTS ANGIOGRAPHY;  Surgeon: Martinique, Peter M, MD;  Location: [facility];  Service: Cardiovascular;  Laterality: N/A;  . SKIN GRAFT      Current Outpatient Medications  Medication Sig Dispense Refill  . aspirin 81  MG tablet Take 81 mg by mouth daily.     . carvedilol (COREG) 3.125 MG tablet Take 1 tablet (3.125 mg total) by mouth 2 (two) times daily. 180 tablet 3  . clopidogrel (PLAVIX) 75 MG tablet TAKE 1 TABLET (75 MG TOTAL) BY MOUTH DAILY WITH BREAKFAST. 90 tablet 1  . ergocalciferol (VITAMIN D2) 1.25 MG (50000 UT) capsule Take 50,000 Units by mouth once a week.    . fluticasone (FLONASE) 50 MCG/ACT nasal spray Place 1 spray into both nostrils 2 (two) times daily.     Marland Kitchen gabapentin (NEURONTIN) 300 MG capsule TAKE 1 CAPSULE BY MOUTH THREE TIMES A DAY    . insulin aspart (NOVOLOG FLEXPEN) 100 UNIT/ML injection Inject 0-12 Units into the skin See admin instructions. Per sliding scale Under 150 no units 150-199 take 8 units 200-249 take 10 units 250-299 take 12 units    . Lancets (ONETOUCH DELICA PLUS [id number]) MISC USE TO CHECK BLOOD SUGARS 3 TIMES A DAY (NEED PART B CARD)    . metFORMIN (GLUCOPHAGE-XR) 500 MG 24 hr tablet TAKE 1 TABLET BY MOUTH TWICE A DAY AFTER MEALS    . montelukast (SINGULAIR) 10 MG tablet Take 10 mg by mouth at bedtime.    . Naloxone HCl (EVZIO) 0.4 MG/0.4ML SOAJ Inject 0.4 mg as directed as needed (for possible overdose).     Karma Greaser 4 MG/0.1ML LIQD nasal spray kit     . nitroGLYCERIN (NITROSTAT) 0.4 MG SL tablet PLACE 1 TABLET UNDER THE TONGUE EVERY 5 MINUTES AS NEEDED FOR CHEST PAIN (UP TO 3 DOSES/15MIN) 75 tablet 1  . ondansetron (ZOFRAN-ODT) 8 MG disintegrating tablet Take 8 mg by mouth every 8 (eight) hours as needed for nausea or vomiting.    Glory Rosebush VERIO test strip USE TO CHECK BLOOD SUGAR 3 TIMES A DAY (NEED PART B CARD)    . oxyCODONE-acetaminophen (PERCOCET) 10-325 MG per tablet Take 1 tablet by mouth 5 (five) times daily.     . rosuvastatin (CRESTOR) 40 MG tablet TAKE 1 TABLET (40 MG TOTAL) EVERY EVENING BY MOUTH. 90 tablet 3  . Semaglutide 1 MG/DOSE SOPN Inject 1 mg once a week into the skin.    Marland Kitchen topiramate (TOPAMAX) 50 MG tablet TAKE 1 TABLET BY MOUTH EVERYDAY AT  BEDTIME    . TRESIBA FLEXTOUCH 100 UNIT/ML SOPN FlexTouch Pen Inject 40 Units as directed at bedtime.   4  . zolpidem (AMBIEN) 5 MG tablet TAKE 1 TABLET BY MOUTH EVERY DAY AT BEDTIME AS NEEDED FOR SLEEP 30 tablet 5  . pantoprazole (PROTONIX) 40 MG tablet Take 1 tablet (40 mg total) by mouth daily. 30 tablet 11   No current facility-administered medications for this visit.    Allergies  Allergen Reactions  . Metformin Diarrhea and Nausea And Vomiting    Immediate release tablets (not metformin ER, pt currently taking)    Social History   Socioeconomic History  . Marital status: Divorced    Spouse name: Not on file  . Number of  children: 2  . Years of education: Not on file  . Highest education level: Not on file  Occupational History  . Occupation: Disability  Tobacco Use  . Smoking status: Former Smoker    Packs/day: 1.00    Years: 28.00    Pack years: 28.00    Types: Cigarettes    Quit date: 03/31/2009    Years since quitting: 10.1  . Smokeless tobacco: Never Used  Substance and Sexual Activity  . Alcohol use: No  . Drug use: No  . Sexual activity: Yes    Birth control/protection: Surgical    Comment: partial hysterectomy  Other Topics Concern  . Not on file  Social History Narrative  . Not on file   Social Determinants of Health   Financial Resource Strain:   . Difficulty of Paying Living Expenses: Not on file  Food Insecurity:   . Worried About Charity fundraiser in the Last Year: Not on file  . Ran Out of Food in the Last Year: Not on file  Transportation Needs:   . Lack of Transportation (Medical): Not on file  . Lack of Transportation (Non-Medical): Not on file  Physical Activity:   . Days of Exercise per Week: Not on file  . Minutes of Exercise per Session: Not on file  Stress:   . Feeling of Stress : Not on file  Social Connections:   . Frequency of Communication with Friends and Family: Not on file  . Frequency of Social Gatherings with Friends  and Family: Not on file  . Attends Religious Services: Not on file  . Active Member of Clubs or Organizations: Not on file  . Attends Archivist Meetings: Not on file  . Marital Status: Not on file  Intimate Partner Violence:   . Fear of Current or Ex-Partner: Not on file  . Emotionally Abused: Not on file  . Physically Abused: Not on file  . Sexually Abused: Not on file    Family History  Problem Relation Age of Onset  . Heart disease Father   . Asthma Father   . Hypertension Father   . Diabetes Father   . Arthritis Father   . Heart disease Mother   . Hypertension Mother   . Diabetes Mother   . Migraines Mother   . Stroke Mother   . Emphysema Maternal Grandmother   . Hypertension Brother   . Diabetes Brother   . Breast cancer Neg Hx     Review of Systems:  As stated in the HPI and otherwise negative.   BP 130/84   Pulse 65   Ht _0  (1.626 m)   Wt 204 lb (92.5 kg)   SpO2 99%   BMI 35.02 kg/m   Physical Examination:  General: Well developed, well nourished, NAD  HEENT: OP clear, mucus membranes moist  SKIN: warm, dry. No rashes. Neuro: No focal deficits  Musculoskeletal: Muscle strength 5/5 all ext  Psychiatric: Mood and affect normal  Neck: No JVD, no carotid bruits, no thyromegaly, no lymphadenopathy.  Lungs:Clear bilaterally, no wheezes, rhonci, crackles Cardiovascular: Regular rate and rhythm. No murmurs, gallops or rubs. Abdomen:Soft. Bowel sounds present. Non-tender.  Extremities: No lower extremity edema. Pulses are 2 + in the bilateral DP/PT.  Echo August 2020: 1. The left ventricle has normal systolic function with an ejection  fraction of 60-65%. The cavity size was normal. Left ventricular diastolic  Doppler parameters are indeterminate. There is abnormal septal motion  consistent with post-operative status.  2. The right ventricle has normal systolic function. The cavity was  normal. There is no increase in right ventricular wall  thickness. Right  ventricular systolic pressure TR inadequate for RVSP assessment.  3. A Annuloplasty ring present valve is present in the mitral position.  Procedure Date: 2011.  4. Mild mitral valve stenosis.  5. S/p mitral valve annuloplasty. Mean gradient across valve ~4.3 mmHG @  HR 65 bpm. Gradients similar to prior study.  6. The tricuspid valve is grossly normal.  7. The aortic valve is tricuspid. No stenosis of the aortic valve.  8. The aorta is normal unless otherwise noted.  9. The aortic root and ascending aorta are normal in size and structure.  10. When compared to the prior study: No significant change from prior  study (12/04/2014).   EKG:  EKG is not ordered today. The ekg ordered today demonstrates   Recent Labs: 11/14/2018: ALT 15; BUN 19; Creatinine, Ser 1.28; Hemoglobin 15.4; Platelets 294; Potassium 5.1; Sodium 138; TSH 1.820   Lipid Panel  Lipid Panel     Component Value Date/Time   CHOL 148 11/14/2018 1220   TRIG 285 (H) 11/14/2018 1220   HDL 36 (L) 11/14/2018 1220   CHOLHDL 4.1 11/14/2018 1220   CHOLHDL 8.9 04/03/2009 0320   VLDL 54 (H) 04/03/2009 0320   LDLCALC 55 11/14/2018 1220    Wt Readings from Last 3 Encounters:  05/29/19 204 lb (92.5 kg)  11/14/18 196 lb 6.4 oz (89.1 kg)  04/03/18 191 lb (86.6 kg)     Other studies Reviewed: Additional studies/ records that were reviewed today include: . Review of the above records demonstrates:    Assessment and Plan:   1. CAD without angina: No chest pain suggestive of angina. Continue ASA, Plavix, statin and beta blocker.     2. HYPERLIPIDEMIA: LDL at goal in August 2020. Continue statin.    3. HYPERTENSION: BP is well controlled. Continue current therapy  4. TOBACCO ABUSE, in remission: She stopped smoking in 2010.   5. Mitral regurgitation: Echo in 2020 showed that the mitral valve repair was stable with mild stenosis  6. Chronic diastolic CHF: No volume overload on exam.   7. Leg  pain: ABI normal November 2018  8. Insomnia: Continue Ambien  9. GERD: Will stop pepcid and start Protonix 40 mg daily.    Current medicines are reviewed at length with the patient today.  The patient does not have concerns regarding medicines.  The following changes have been made:  no change  Labs/ tests ordered today include:   No orders of the defined types were placed in this encounter.   Disposition:   FU with me in 6  months  Signed, Lauree Chandler, MD 05/29/2019 11:13 AM    [address], [place], [place]  [address] Phone: [phone]; Fax: [phone]

## 2019-06-01 IMAGING — DX DG CHEST 2V
2 series · 2 of 2 positions shown · non-contrast
Comparison: Chest x-ray of April 06, 2016

CLINICAL DATA: Chest discomfort while walking into the cardiac
rehab section from the parking lot. Earlier today the patient was
awakened with sharp jaw pain and indigestion which have lingered.

EXAM:
CHEST  2 VIEW

[chest pa]
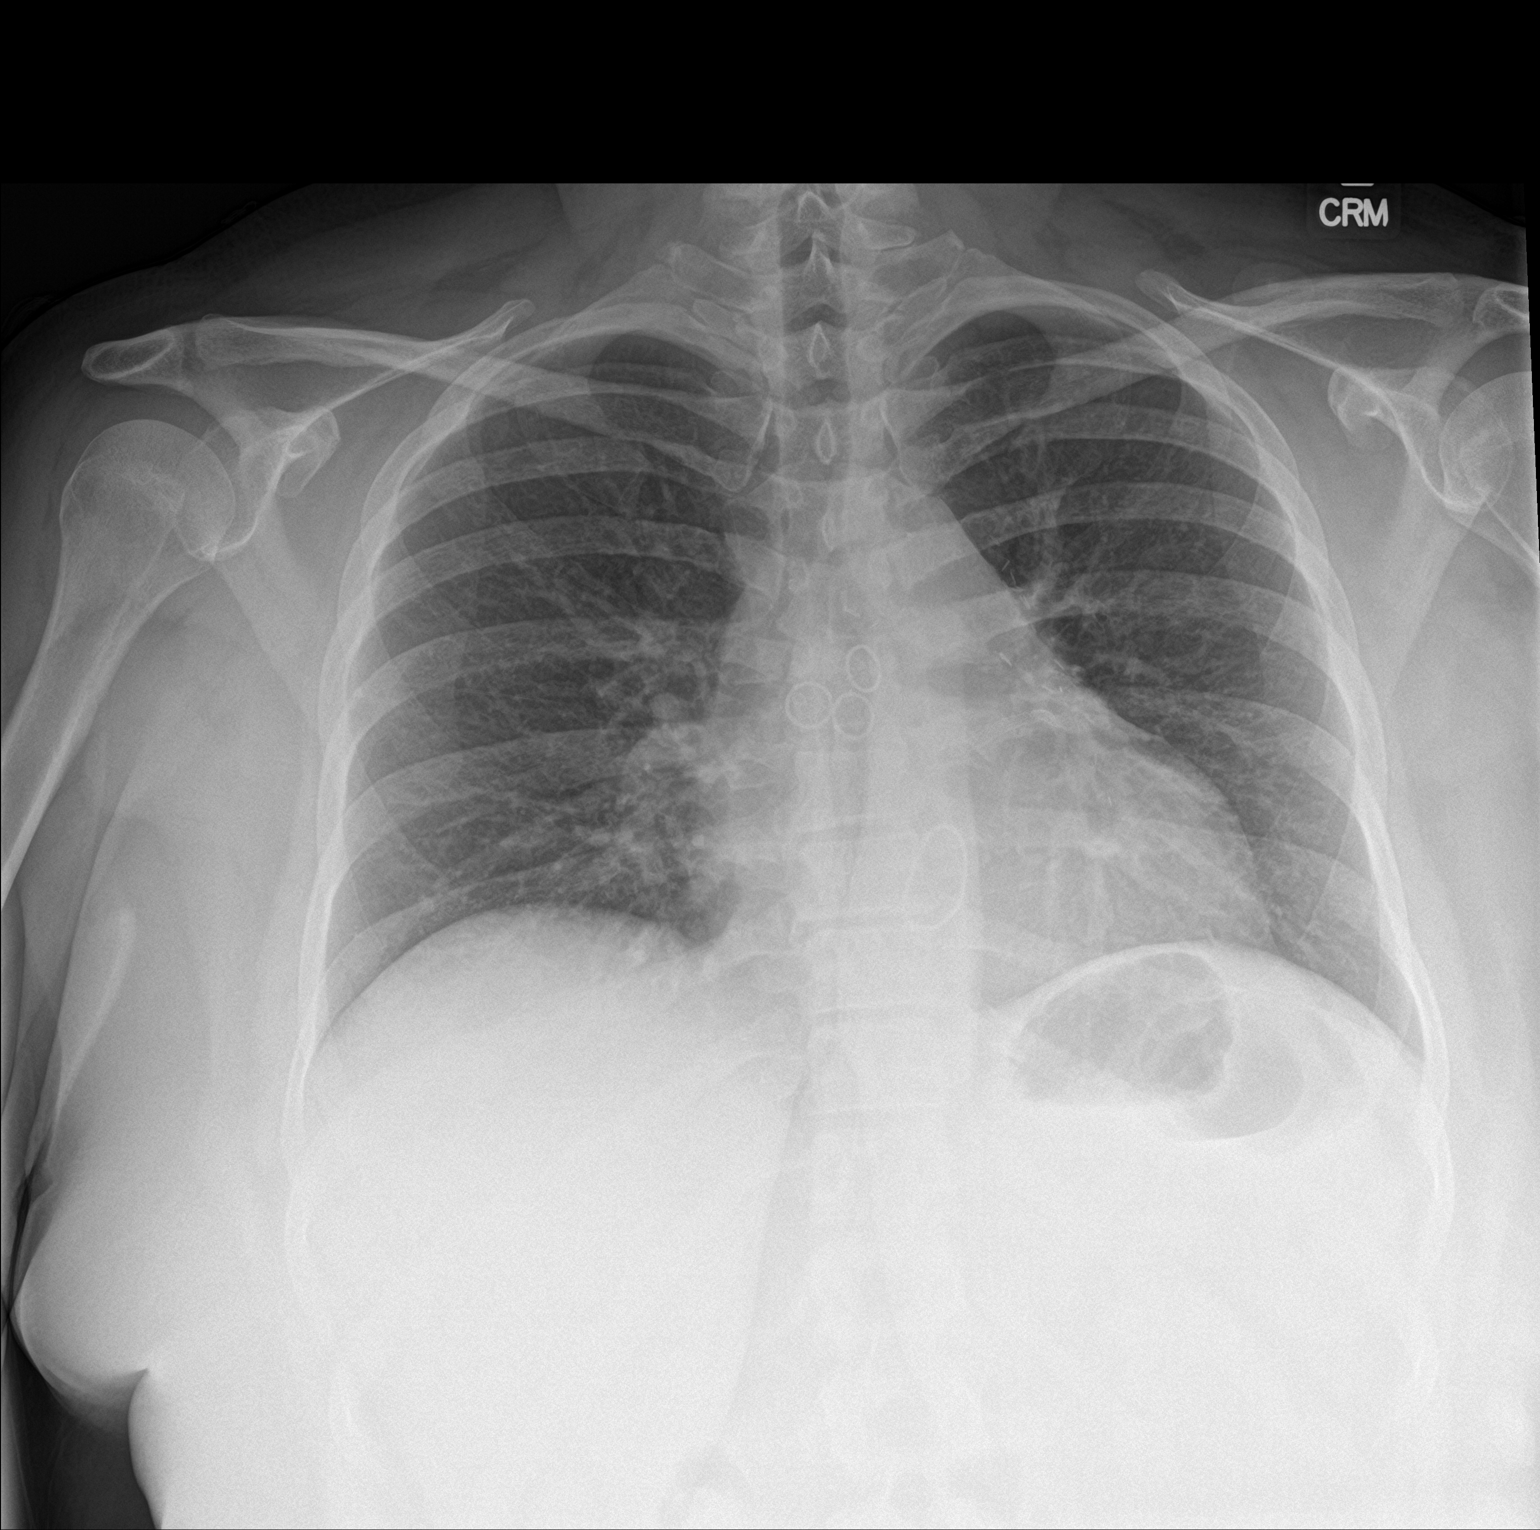

[chest lat]
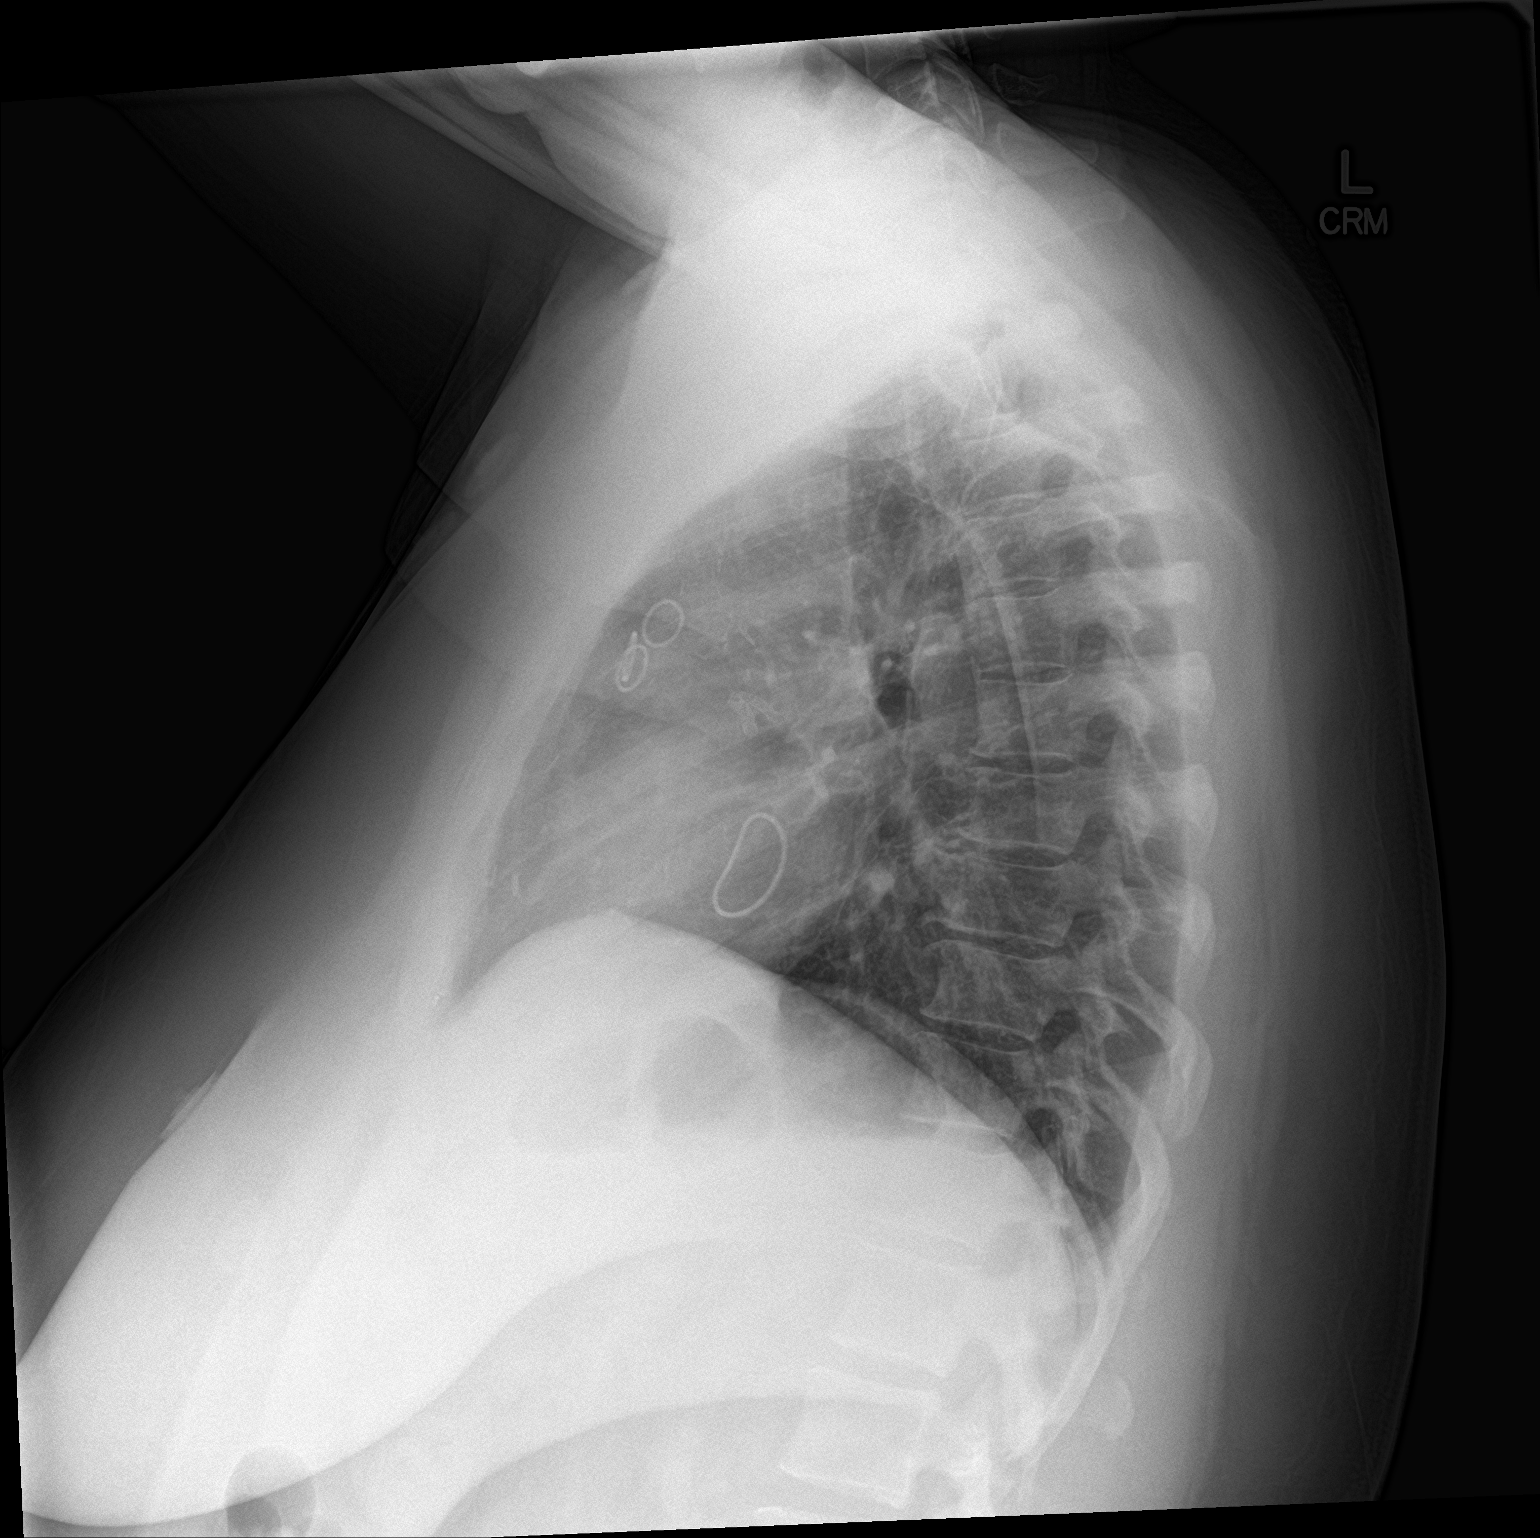

[2 of 2 positions shown; findings below may reference images not displayed]

FINDINGS: The lungs are adequately inflated and clear. The heart and pulmonary
vascularity are normal. The patient has undergone previous CABG and
mitral valve replacement. The mediastinum is normal in width. The
bony thorax exhibits no acute abnormality.
IMPRESSION: There is no CHF nor other acute cardiopulmonary abnormality.
Previous CABG and mitral valve replacement.

## 2019-06-09 DIAGNOSIS — H524 Presbyopia: Secondary | ICD-10-CM | POA: Diagnosis not present

## 2019-06-09 DIAGNOSIS — E119 Type 2 diabetes mellitus without complications: Secondary | ICD-10-CM | POA: Diagnosis not present

## 2019-06-10 DIAGNOSIS — M545 Low back pain: Secondary | ICD-10-CM | POA: Diagnosis not present

## 2019-06-10 DIAGNOSIS — M79661 Pain in right lower leg: Secondary | ICD-10-CM | POA: Diagnosis not present

## 2019-06-10 DIAGNOSIS — M79662 Pain in left lower leg: Secondary | ICD-10-CM | POA: Diagnosis not present

## 2019-06-10 DIAGNOSIS — G894 Chronic pain syndrome: Secondary | ICD-10-CM | POA: Diagnosis not present

## 2019-06-20 DIAGNOSIS — E119 Type 2 diabetes mellitus without complications: Secondary | ICD-10-CM | POA: Diagnosis not present

## 2019-06-20 DIAGNOSIS — I10 Essential (primary) hypertension: Secondary | ICD-10-CM | POA: Diagnosis not present

## 2019-06-20 DIAGNOSIS — R519 Headache, unspecified: Secondary | ICD-10-CM | POA: Diagnosis not present

## 2019-06-20 DIAGNOSIS — M542 Cervicalgia: Secondary | ICD-10-CM | POA: Diagnosis not present

## 2019-07-08 ENCOUNTER — Telehealth: Payer: Self-pay | Admitting: Cardiovascular Disease

## 2019-07-08 DIAGNOSIS — M79661 Pain in right lower leg: Secondary | ICD-10-CM | POA: Diagnosis not present

## 2019-07-08 DIAGNOSIS — M545 Low back pain: Secondary | ICD-10-CM | POA: Diagnosis not present

## 2019-07-08 DIAGNOSIS — M79662 Pain in left lower leg: Secondary | ICD-10-CM | POA: Diagnosis not present

## 2019-07-08 DIAGNOSIS — G894 Chronic pain syndrome: Secondary | ICD-10-CM | POA: Diagnosis not present

## 2019-07-08 NOTE — Telephone Encounter (Signed)
Pt is in [place], Saturday had worst sharp pain over left chest.  Walking in cold wind.  Came around the corner of a building and wind hit her, took a deep breath and felt it. Did not radiate.  No SOB Continued walking.  Lasted only a second.  Ate pizza an hour prior to that  Did a lot of walking that day.  Yesterday fatigued/sluggish.  With her arthritis she has those days.  Today feels great like she could run 5 miles.  This is different than her previous heart symptoms.  Reassured patient that this is not typical of cardiac pain and to continue to be aware for any further pains like this.  If they last longer or worsen she should call back and let Dr. Angelena Form know.  Protonix is working better than pepcid for indigestion symptoms.

## 2019-07-08 NOTE — Telephone Encounter (Signed)
Thanks

## 2019-07-08 NOTE — Telephone Encounter (Signed)
New Message     Pt is calling to speak with the nurse  She says Saturday she had a sharpe pain and it went away. Yesterday she said she felt uncomfortable but today she feels great.  She says she is out of town today but is wondering if she should make an appt    Please call

## 2019-07-10 ENCOUNTER — Telehealth: Payer: Self-pay | Admitting: Cardiovascular Disease

## 2019-07-10 NOTE — Telephone Encounter (Signed)
The patient experienced sharp chest pain again yesterday, lasted short time, but longer than first time.  Had associated chest pressure with this.  Sitting in chair drinking a glass of water at the time.  There was no numbness or tingling this time.  I scheduled her with Richardson Dopp, PA-C tomorrow.  Pt is grateful for appointment.

## 2019-07-10 NOTE — Telephone Encounter (Signed)
   Pt calling back, she said she had another episode yesterday sharp pain in her chest, makes her very uncomfortable. She took aspirin and the pain went away, she wanted to know Dr. Camillia Herter recommendation and if she needs to see him this time.   Please advise

## 2019-07-11 ENCOUNTER — Other Ambulatory Visit: Payer: Self-pay | Admitting: *Deleted

## 2019-07-11 ENCOUNTER — Encounter: Payer: Self-pay | Admitting: Physician Assistant

## 2019-07-11 ENCOUNTER — Other Ambulatory Visit: Payer: Self-pay

## 2019-07-11 ENCOUNTER — Ambulatory Visit (INDEPENDENT_AMBULATORY_CARE_PROVIDER_SITE_OTHER): Payer: Medicare Other | Admitting: Physician Assistant

## 2019-07-11 VITALS — BP 124/76 | HR 64 | Ht 64.0 in | Wt 202.0 lb

## 2019-07-11 DIAGNOSIS — I340 Nonrheumatic mitral (valve) insufficiency: Secondary | ICD-10-CM

## 2019-07-11 DIAGNOSIS — R072 Precordial pain: Secondary | ICD-10-CM | POA: Diagnosis not present

## 2019-07-11 DIAGNOSIS — I25810 Atherosclerosis of coronary artery bypass graft(s) without angina pectoris: Secondary | ICD-10-CM | POA: Diagnosis not present

## 2019-07-11 DIAGNOSIS — I10 Essential (primary) hypertension: Secondary | ICD-10-CM

## 2019-07-11 DIAGNOSIS — I5032 Chronic diastolic (congestive) heart failure: Secondary | ICD-10-CM

## 2019-07-11 DIAGNOSIS — E7800 Pure hypercholesterolemia, unspecified: Secondary | ICD-10-CM | POA: Diagnosis not present

## 2019-07-11 DIAGNOSIS — Z9889 Other specified postprocedural states: Secondary | ICD-10-CM | POA: Diagnosis not present

## 2019-07-11 LAB — TROPONIN I (HIGH SENSITIVITY): Troponin I (High Sensitivity): 6 ng/L (ref <18)

## 2019-07-11 MED ORDER — ISOSORBIDE MONONITRATE ER 30 MG PO TB24: 15.0000 mg | ORAL_TABLET | Freq: Every day | ORAL | 1 refills | Status: DC

## 2019-07-11 NOTE — Progress Notes (Signed)
Cardiology Office Note:    Date:  07/11/2019   ID:  ZENAB GRONEWOLD, DOB 01-May-1970, MRN [id number]  PCP:  Secundino Ginger, PA-C  Cardiologist:  Lauree Chandler, MD  Electrophysiologist:  None   Referring MD: Secundino Ginger, PA-C   Chief Complaint:  Chest Pain    Patient Profile:    Elizabeth Park is a 49 y.o. female with:   Coronary artery disease   S/p NSTEMI >> s/p CABG in 04/2009  Myoview 6/18: EF 62, IL/AL/Apical scar + Ischemia  S/p DES to S-PDA, DES to RI, DES to protected LM in 6/18  S/p DES to RI 2/2 severe ISR in 11/18  Mitral regurgitation  S/p MV Repair 04/2009  Echocardiogram 11/2018: EF 60-65, stable MV repair w mild stenosis  Chronic Diastolic CHF  Diabetes mellitus   Hypertension   Hyperlipidemia   Ex-smoker  Prior CV studies: Echocardiogram 11/20/2018 EF 60-65, normal RV SF, MV annuloplasty with mean gradient 4.3 mmHg (mild MS)  ABIs 02/21/2017 R 0.98; L 1.01  Cardiac catheterization 02/07/2017 LAD proximal 100 RI stent 99 ISR LCx proximal 100 RCA proximal 100 LIMA-LAD patent SVG-RI 100 SVG-distal RCA patent PCI: 2.5 x 15 mm Resolute Onyx DES to the RI  History of Present Illness:    [name] was last seen by Dr. Angelena Form in 05/2019.  She returns for evaluation of chest discomfort.   This past weekend, she was in [place], [place].  While walking, she had a sharp pain in her chest that lasted less than a second.  She did feel tired afterward and stopped her walk and went back to the hotel.  Two days ago, she had another episode of chest pain that was sharp and only lasted a second.   Yesterday, she had tightness in her chest for couple of hours.  This was not brought on or made worse by exertion.  She had no associated shortness of breath.  She took aspirin and thinks it may have helped.  Today, she has some burning type discomfort in her left axilla.  This is not brought on or made worse by exertion.  She has not had syncope, orthopnea or leg  swelling.  Past Medical History:  Diagnosis Date  . Chronic diastolic CHF (congestive heart failure) ([place])   . Coronary artery disease    a. 4v CABG  (LIMA to LAD, SVG to subbranch of ramus intermediate, SVG to lateral subbranch of ramus intermediate, SVG to distal RCA) and mitral valve repair January 2011. b. Abnormal nuc 09/2016 s/p  drug eluting stent to the vein graft to the PDA, a drug eluting stent in the native Ramus intermediate branch and a drug eluting stent in the protected left main. c. DES to ramus intermediate 02/2017.  . Diabetes mellitus   . H/O emotional problems   . Headache, migraine   . High cholesterol   . History of blood transfusion   . Hypertension   . MI, old   . MRSA (methicillin resistant Staphylococcus aureus)   . MVP (mitral valve prolapse)   . Neuromuscular disorder ([place])   . Neuropathy   . S/P mitral valve repair   . Sebaceous cyst     Current Medications: Current Meds  Medication Sig  . aspirin 81 MG tablet Take 81 mg by mouth daily.   . carvedilol (COREG) 3.125 MG tablet Take 1 tablet (3.125 mg total) by mouth 2 (two) times daily.  . clopidogrel (PLAVIX) 75 MG tablet TAKE 1  TABLET (75 MG TOTAL) BY MOUTH DAILY WITH BREAKFAST.  Marland Kitchen ergocalciferol (VITAMIN D2) 1.25 MG (50000 UT) capsule Take 50,000 Units by mouth once a week.  . fluticasone (FLONASE) 50 MCG/ACT nasal spray Place 1 spray into both nostrils 2 (two) times daily.   Marland Kitchen gabapentin (NEURONTIN) 300 MG capsule TAKE 1 CAPSULE BY MOUTH THREE TIMES A DAY  . insulin aspart (NOVOLOG FLEXPEN) 100 UNIT/ML injection Inject 0-12 Units into the skin See admin instructions. Per sliding scale Under 150 no units 150-199 take 8 units 200-249 take 10 units 250-299 take 12 units  . Lancets (ONETOUCH DELICA PLUS [id number]) MISC USE TO CHECK BLOOD SUGARS 3 TIMES A DAY (NEED PART B CARD)  . metFORMIN (GLUCOPHAGE-XR) 500 MG 24 hr tablet TAKE 1 TABLET BY MOUTH TWICE A DAY AFTER MEALS  . Naloxone HCl (EVZIO) 0.4  MG/0.4ML SOAJ Inject 0.4 mg as directed as needed (for possible overdose).   Karma Greaser 4 MG/0.1ML LIQD nasal spray kit   . nitroGLYCERIN (NITROSTAT) 0.4 MG SL tablet PLACE 1 TABLET UNDER THE TONGUE EVERY 5 MINUTES AS NEEDED FOR CHEST PAIN (UP TO 3 DOSES/15MIN)  . ONETOUCH VERIO test strip USE TO CHECK BLOOD SUGAR 3 TIMES A DAY (NEED PART B CARD)  . oxyCODONE-acetaminophen (PERCOCET) 10-325 MG per tablet Take 1 tablet by mouth 5 (five) times daily.   . pantoprazole (PROTONIX) 40 MG tablet Take 1 tablet (40 mg total) by mouth daily.  . rosuvastatin (CRESTOR) 40 MG tablet TAKE 1 TABLET (40 MG TOTAL) EVERY EVENING BY MOUTH.  . Semaglutide 1 MG/DOSE SOPN Inject 1 mg once a week into the skin.  . TRESIBA FLEXTOUCH 100 UNIT/ML SOPN FlexTouch Pen Inject 40 Units as directed at bedtime.   Marland Kitchen zolpidem (AMBIEN) 5 MG tablet TAKE 1 TABLET BY MOUTH EVERY DAY AT BEDTIME AS NEEDED FOR SLEEP     Allergies:   Metformin   Social History   Tobacco Use  . Smoking status: Former Smoker    Packs/day: 1.00    Years: 28.00    Pack years: 28.00    Types: Cigarettes    Quit date: 03/31/2009    Years since quitting: 10.2  . Smokeless tobacco: Never Used  Substance Use Topics  . Alcohol use: No  . Drug use: No     Family Hx: The patient's family history includes Arthritis in her father; Asthma in her father; Diabetes in her brother, father, and mother; Emphysema in her maternal grandmother; Heart disease in her father and mother; Hypertension in her brother, father, and mother; Migraines in her mother; Stroke in her mother. There is no history of Breast cancer.  ROS   EKGs/Labs/Other Test Reviewed:    EKG:  EKG is  ordered today.  The ekg ordered today demonstrates normal sinus rhythm, heart rate 64, left axis deviation, nonspecific ST-T wave changes, QTC 420, no change from prior tracings  Recent Labs: 11/14/2018: ALT 15; BUN 19; Creatinine, Ser 1.28; Hemoglobin 15.4; Platelets 294; Potassium 5.1; Sodium  138; TSH 1.820   Recent Lipid Panel Lab Results  Component Value Date/Time   CHOL 148 11/14/2018 12:20 PM   TRIG 285 (H) 11/14/2018 12:20 PM   HDL 36 (L) 11/14/2018 12:20 PM   CHOLHDL 4.1 11/14/2018 12:20 PM   CHOLHDL 8.9 04/03/2009 03:20 AM   LDLCALC 55 11/14/2018 12:20 PM    Physical Exam:    VS:  BP 124/76   Pulse 64   Ht _0  (1.626 m)   Wt 202  lb (91.6 kg)   SpO2 98%   BMI 34.67 kg/m     Wt Readings from Last 3 Encounters:  07/11/19 202 lb (91.6 kg)  05/29/19 204 lb (92.5 kg)  11/14/18 196 lb 6.4 oz (89.1 kg)     Constitutional:      Appearance: Healthy appearance. Not in distress.  Neck:     Thyroid: Thyroid normal.     Vascular: JVD normal.  Pulmonary:     Effort: Pulmonary effort is normal.     Breath sounds: No wheezing. No rales.  Chest:     Chest wall: Not tender to palpatation.  Cardiovascular:     Normal rate. Regular rhythm. Normal S1. Normal S2.     Murmurs: There is no murmur.  Edema:    Peripheral edema absent.  Abdominal:     Palpations: Abdomen is soft. There is no hepatomegaly.  Skin:    General: Skin is warm and dry.  Neurological:     Mental Status: Alert and oriented to person, place and time.     Cranial Nerves: Cranial nerves are intact.      ASSESSMENT & PLAN:    1. Coronary artery disease involving coronary bypass graft of native heart without angina pectoris 2. Precordial chest pain History of CABG in 2011 following non-STEMI.  She is status post drug-eluting stent to the vein graft to the PDA, native RI and protected left main in June 2018.  She underwent drug-eluting stent to the RI secondary to in-stent restenosis in November 2018.  She has had several different types of chest pain in the past week.  The sharp pain she had while in [place] was somewhat similar to what she had prior to her PCI.  However, she has not had exertional symptoms or shortness of breath.  She has not had any radiating symptoms.  Her ECG is unchanged.   She notes that her symptoms have all been different prior every intervention.  I have recommended obtaining a stat troponin today.  If this is elevated, she will need admission to the hospital and cardiac catheterization.  If this is normal, we will proceed with Lexiscan Myoview next week and early follow-up.  I have also suggested starting long-acting nitrates to her medical therapy.  -Labs today: Stat troponin, CBC, BMET  -Schedule Lexiscan Myoview next week  -If troponin elevated, cancel Myoview and send to ED for admission  -Isosorbide mononitrate 15 mg daily  -Continue aspirin, carvedilol, clopidogrel, rosuvastatin  -Follow-up 2 weeks  3. Chronic diastolic CHF (congestive heart failure) (H[facility]) EF normal by echocardiogram August 2020.  NYHA II.  Volume status stable.  She is not on diuretic therapy.    4. Essential hypertension The patient's blood pressure is controlled on her current regimen.  Continue current therapy.   5. Pure hypercholesterolemia Continue high intensity statin therapy.  6. Nonrheumatic mitral valve regurgitation 7. S/P mitral valve repair Stable mitral valve repair by echocardiogram August 2020.  Continue SBE prophylaxis.   Dispo:  Return in about 2 weeks (around 07/25/2019) for Follow up after testing w/ Dr. Angelena Form, or PA/NP, in person.   Medication Adjustments/Labs and Tests Ordered: Current medicines are reviewed at length with the patient today.  Concerns regarding medicines are outlined above.  Tests Ordered: Orders Placed This Encounter  Procedures  . Basic metabolic panel  . CBC  . Troponin T, STAT ([facility])  . MYOCARDIAL PERFUSION IMAGING  . EKG 12-Lead   Medication Changes: Meds ordered this encounter  Medications  .  isosorbide mononitrate (IMDUR) 30 MG 24 hr tablet    Sig: Take 0.5 tablets (15 mg total) by mouth daily.    Dispense:  45 tablet    Refill:  1    Signed, Richardson Dopp, PA-C  07/11/2019 1:25 PM    [facility]  [facility], [place], [place]  [address] Phone: [phone]; Fax: [phone]

## 2019-07-11 NOTE — Patient Instructions (Signed)
Medication Instructions:   Your physician has recommended you make the following change in your medication:   1) Start Imdur 30 mg, 0.5 tablet by mouth once a day  *If you need a refill on your cardiac medications before your next appointment, please call your pharmacy*  Lab Work:  You will have labs drawn today: BMET/CBC/STAT Troponin  If you have labs (blood work) drawn today and your tests are completely normal, you will receive your results only by: Marland Kitchen MyChart Message (if you have MyChart) OR . A paper copy in the mail If you have any lab test that is abnormal or we need to change your treatment, we will call you to review the results.  Testing/Procedures:  Your physician has requested that you have a lexiscan myoview. For further information please visit [email or web address]. Please follow instruction sheet, as given.  Follow-Up: At [facility], you and your health needs are our priority.  As part of our continuing mission to provide you with exceptional heart care, we have created designated Provider Care Teams.  These Care Teams include your primary Cardiologist (physician) and Advanced Practice Providers (APPs -  Physician Assistants and Nurse Practitioners) who all work together to provide you with the care you need, when you need it.  We recommend signing up for the patient portal called "MyChart".  Sign up information is provided on this After Visit Summary.  MyChart is used to connect with patients for Virtual Visits (Telemedicine).  Patients are able to view lab/test results, encounter notes, upcoming appointments, etc.  Non-urgent messages can be sent to your provider as well.   To learn more about what you can do with MyChart, go to [email or web address].    Your next appointment:   2 week(s)  The format for your next appointment:   In Person  Provider:   You may see Lauree Chandler, MD or one of the following Advanced Practice Providers on your designated  Care Team:    Melina Copa, PA-C  Ermalinda Barrios, PA-C

## 2019-07-11 NOTE — Addendum Note (Signed)
Addended by: Mady Haagensen on: 07/11/2019 02:20 PM   Modules accepted: Orders

## 2019-07-12 LAB — BASIC METABOLIC PANEL
BUN/Creatinine Ratio: 11 (ref 9–23)
BUN: 13 mg/dL (ref 6–24)
CO2: 20 mmol/L (ref 20–29)
Calcium: 10.4 mg/dL — ABNORMAL HIGH (ref 8.7–10.2)
Chloride: 98 mmol/L (ref 96–106)
Creatinine, Ser: 1.2 mg/dL — ABNORMAL HIGH (ref 0.57–1.00)
GFR calc Af Amer: 61 mL/min/{1.73_m2} (ref >59)
GFR calc non Af Amer: 53 mL/min/{1.73_m2} — ABNORMAL LOW (ref >59)
Glucose: 230 mg/dL — ABNORMAL HIGH (ref 65–99)
Potassium: 4.7 mmol/L (ref 3.5–5.2)
Sodium: 137 mmol/L (ref 134–144)

## 2019-07-12 LAB — CBC
Hematocrit: 45.4 % (ref 34.0–46.6)
Hemoglobin: 15.2 g/dL (ref 11.1–15.9)
MCH: 31 pg (ref 26.6–33.0)
MCHC: 33.5 g/dL (ref 31.5–35.7)
MCV: 93 fL (ref 79–97)
Platelets: 302 10*3/uL (ref 150–450)
RBC: 4.9 x10E6/uL (ref 3.77–5.28)
RDW: 12.3 % (ref 11.7–15.4)
WBC: 9.7 10*3/uL (ref 3.4–10.8)

## 2019-07-18 ENCOUNTER — Other Ambulatory Visit: Payer: Self-pay | Admitting: Physician Assistant

## 2019-07-23 ENCOUNTER — Telehealth (HOSPITAL_COMMUNITY): Payer: Self-pay | Admitting: *Deleted

## 2019-07-23 NOTE — Telephone Encounter (Signed)
Patient given detailed instructions per Myocardial Perfusion Study Information Sheet for the test on 07/28/19 at 10:00. Patient notified to arrive 15 minutes early and that it is imperative to arrive on time for appointment to keep from having the test rescheduled.  If you need to cancel or reschedule your appointment, please call the office within 24 hours of your appointment. . Patient verbalized understanding.Elizabeth Park

## 2019-07-28 ENCOUNTER — Ambulatory Visit (HOSPITAL_COMMUNITY): Payer: Medicare Other | Attending: Internal Medicine

## 2019-07-28 ENCOUNTER — Other Ambulatory Visit: Payer: Self-pay

## 2019-07-28 DIAGNOSIS — I25810 Atherosclerosis of coronary artery bypass graft(s) without angina pectoris: Secondary | ICD-10-CM | POA: Insufficient documentation

## 2019-07-28 MED ORDER — TECHNETIUM TC 99M TETROFOSMIN IV KIT
31.8000 | PACK | Freq: Once | INTRAVENOUS | Status: AC | PRN
  Administered 2019-07-28: 31.8 via INTRAVENOUS
  Filled 2019-07-28: qty 32

## 2019-07-28 MED ORDER — REGADENOSON 0.4 MG/5ML IV SOLN
0.4000 mg | Freq: Once | INTRAVENOUS | Status: AC
  Administered 2019-07-28: 0.4 mg via INTRAVENOUS

## 2019-07-29 ENCOUNTER — Encounter: Payer: Self-pay | Admitting: Physician Assistant

## 2019-07-29 ENCOUNTER — Ambulatory Visit (HOSPITAL_COMMUNITY): Payer: Medicare Other | Attending: Cardiovascular Disease

## 2019-07-29 LAB — MYOCARDIAL PERFUSION IMAGING
LV dias vol: 57 mL (ref 46–106)
LV sys vol: 23 mL
Peak HR: 81 {beats}/min
Rest HR: 60 {beats}/min
SDS: 3
SRS: 1
SSS: 4
TID: 1.21

## 2019-07-29 MED ORDER — TECHNETIUM TC 99M TETROFOSMIN IV KIT
32.6000 | PACK | Freq: Once | INTRAVENOUS | Status: AC | PRN
  Administered 2019-07-29: 32.6 via INTRAVENOUS
  Filled 2019-07-29: qty 33

## 2019-07-29 NOTE — Progress Notes (Signed)
Cardiology Office Note    Date:  07/30/2019   ID:  Willine, Schwalbe January 31, 1971, MRN [id number]  PCP:  Secundino Ginger, PA-C  Cardiologist: Lauree Chandler, MD EPS: None  Chief Complaint  Patient presents with  . Follow-up    History of Present Illness:  Elizabeth Park is a 49 y.o. 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 Richardson Dopp, PA-C 07/11/2019 with a variety of chest pain.  EKG was unchanged.  He added Imdur 15 mg daily and got a stat troponin that was normal.  Creatinine was 1.2 glucose 230 other labs normal.  Lexiscan Myoview was low risk with no ischemia 07/29/2019  Patient comes in for f/u. Feels much better on Imdur. No further chest pain. No regular exercise.  Wants to go back to the gym but has not been vaccinated yet.  Past Medical History:  Diagnosis Date  . Chronic diastolic CHF (congestive heart failure) ([place])   . Coronary artery disease    a. 4v CABG  (LIMA to LAD, SVG to subbranch of ramus intermediate, SVG to lateral subbranch of ramus intermediate, SVG to distal RCA) and mitral valve repair January 2011. b. Abnormal nuc 09/2016 s/p  drug eluting stent to the vein graft to the PDA, a drug eluting stent in the native Ramus intermediate branch and a drug eluting stent in the protected left main. c. DES to ramus intermediate 02/2017.   . Diabetes mellitus   . H/O emotional problems   . Headache, migraine   . High cholesterol   . History of blood transfusion   . History of nuclear stress test    Myoview 07/2019: EF 60, normal perfusion, low risk.  Marland Kitchen Hypertension   . MI, old   . MRSA (methicillin resistant Staphylococcus  aureus)   . MVP (mitral valve prolapse)   . Neuromuscular disorder ([place])   . Neuropathy   . S/P mitral valve repair   . Sebaceous cyst     Past Surgical History:  Procedure Laterality Date  . ABDOMINAL HYSTERECTOMY    . CARDIAC CATHETERIZATION  09/22/2016  . CORONARY ARTERY BYPASS GRAFT  with valve repair  . CORONARY STENT INTERVENTION N/A 09/22/2016   Procedure: Coronary Stent Intervention;  Surgeon: Burnell Blanks, MD;  Location: [facility];  Service: Cardiovascular;  Laterality: N/A;  . CORONARY STENT INTERVENTION N/A 02/07/2017   Procedure: CORONARY STENT INTERVENTION;  Surgeon: Martinique, Peter M, MD;  Location: [facility];  Service: Cardiovascular;  Laterality: N/A;  . CORONARY STENT PLACEMENT  09/22/2016  . LEFT HEART CATH AND CORS/GRAFTS ANGIOGRAPHY N/A 09/22/2016   Procedure: Left Heart Cath and Cors/Grafts Angiography;  Surgeon: Burnell Blanks, MD;  Location: [facility];  Service: Cardiovascular;  Laterality: N/A;  . LEFT HEART CATH AND CORS/GRAFTS ANGIOGRAPHY N/A 02/07/2017   Procedure: LEFT HEART CATH AND CORS/GRAFTS ANGIOGRAPHY;  Surgeon: Martinique, Peter M, MD;  Location: [facility];  Service: Cardiovascular;  Laterality: N/A;  . SKIN GRAFT      Current Medications: Current Meds  Medication Sig  . aspirin 81 MG tablet Take  81 mg by mouth daily.   . carvedilol (COREG) 3.125 MG tablet Take 1 tablet (3.125 mg total) by mouth 2 (two) times daily.  . clopidogrel (PLAVIX) 75 MG tablet TAKE 1 TABLET (75 MG TOTAL) BY MOUTH DAILY WITH BREAKFAST.  Marland Kitchen ergocalciferol (VITAMIN D2) 1.25 MG (50000 UT) capsule Take 50,000 Units by mouth once a week.  . fluticasone (FLONASE) 50 MCG/ACT nasal spray Place 1 spray into both nostrils 2 (two) times daily.   Marland Kitchen gabapentin (NEURONTIN) 300 MG capsule TAKE 1 CAPSULE BY MOUTH THREE TIMES A DAY  . insulin aspart (NOVOLOG FLEXPEN) 100 UNIT/ML injection Inject 0-12 Units into the skin See admin instructions.  Per sliding scale Under 150 no units 150-199 take 8 units 200-249 take 10 units 250-299 take 12 units  . isosorbide mononitrate (IMDUR) 30 MG 24 hr tablet Take 0.5 tablets (15 mg total) by mouth daily.  . Lancets (ONETOUCH DELICA PLUS [id number]) MISC USE TO CHECK BLOOD SUGARS 3 TIMES A DAY (NEED PART B CARD)  . metFORMIN (GLUCOPHAGE-XR) 500 MG 24 hr tablet TAKE 1 TABLET BY MOUTH TWICE A DAY AFTER MEALS  . NARCAN 4 MG/0.1ML LIQD nasal spray kit   . nitroGLYCERIN (NITROSTAT) 0.4 MG SL tablet PLACE 1 TABLET UNDER THE TONGUE EVERY 5 MINUTES AS NEEDED FOR CHEST PAIN (UP TO 3 DOSES/15MIN)  . ONETOUCH VERIO test strip USE TO CHECK BLOOD SUGAR 3 TIMES A DAY (NEED PART B CARD)  . oxyCODONE-acetaminophen (PERCOCET) 10-325 MG per tablet Take 1 tablet by mouth 5 (five) times daily.   . pantoprazole (PROTONIX) 40 MG tablet Take 1 tablet (40 mg total) by mouth daily.  . rosuvastatin (CRESTOR) 40 MG tablet TAKE 1 TABLET (40 MG TOTAL) EVERY EVENING BY MOUTH.  . Semaglutide 1 MG/DOSE SOPN Inject 1 mg once a week into the skin.  . TRESIBA FLEXTOUCH 100 UNIT/ML SOPN FlexTouch Pen Inject 40 Units as directed at bedtime.   Marland Kitchen zolpidem (AMBIEN) 5 MG tablet TAKE 1 TABLET BY MOUTH EVERY DAY AT BEDTIME AS NEEDED FOR SLEEP     Allergies:   Metformin   Social History   Socioeconomic History  . Marital status: Divorced    Spouse name: Not on file  . Number of children: 2  . Years of education: Not on file  . Highest education level: Not on file  Occupational History  . Occupation: Disability  Tobacco Use  . Smoking status: Former Smoker    Packs/day: 1.00    Years: 28.00    Pack years: 28.00    Types: Cigarettes    Quit date: 03/31/2009    Years since quitting: 10.3  . Smokeless tobacco: Never Used  Substance and Sexual Activity  . Alcohol use: No  . Drug use: No  . Sexual activity: Yes    Birth control/protection: Surgical    Comment: partial hysterectomy  Other Topics Concern  . Not on file    Social History Narrative  . Not on file   Social Determinants of Health   Financial Resource Strain:   . Difficulty of Paying Living Expenses:   Food Insecurity:   . Worried About Charity fundraiser in the Last Year:   . Arboriculturist in the Last Year:   Transportation Needs:   . Film/video editor (Medical):   Marland Kitchen Lack of Transportation (Non-Medical):   Physical Activity:   . Days of Exercise per Week:   . Minutes of Exercise per Session:   Stress:   .  Feeling of Stress :   Social Connections:   . Frequency of Communication with Friends and Family:   . Frequency of Social Gatherings with Friends and Family:   . Attends Religious Services:   . Active Member of Clubs or Organizations:   . Attends Archivist Meetings:   Marland Kitchen Marital Status:      Family History:  The patient's   family history includes Arthritis in her father; Asthma in her father; Diabetes in her brother, father, and mother; Emphysema in her maternal grandmother; Heart disease in her father and mother; Hypertension in her brother, father, and mother; Migraines in her mother; Stroke in her mother.   ROS:   Please see the history of present illness.    ROS All other systems reviewed and are negative.   PHYSICAL EXAM:   VS:  BP 114/72   Pulse 60   Ht 5' 4"  (1.626 m)   Wt 206 lb (93.4 kg)   SpO2 97%   BMI 35.36 kg/m   Physical Exam  GEN: Obese, in no acute distress  Neck: no JVD, carotid bruits, or masses Cardiac:RRR; no murmurs, rubs, or gallops  Respiratory:  clear to auscultation bilaterally, normal work of breathing GI: soft, nontender, nondistended, + BS Ext: without cyanosis, clubbing, or edema, Good distal pulses bilaterally Neuro:  Alert and Oriented x 3 Psych: euthymic mood, full affect  Wt Readings from Last 3 Encounters:  07/30/19 206 lb (93.4 kg)  07/28/19 202 lb (91.6 kg)  07/11/19 202 lb (91.6 kg)      Studies/Labs Reviewed:   EKG:  EKG is not ordered today.     Recent Labs: 11/14/2018: ALT 15; TSH 1.820 07/11/2019: BUN 13; Creatinine, Ser 1.20; Hemoglobin 15.2; Platelets 302; Potassium 4.7; Sodium 137   Lipid Panel    Component Value Date/Time   CHOL 148 11/14/2018 1220   TRIG 285 (H) 11/14/2018 1220   HDL 36 (L) 11/14/2018 1220   CHOLHDL 4.1 11/14/2018 1220   CHOLHDL 8.9 04/03/2009 0320   VLDL 54 (H) 04/03/2009 0320   LDLCALC 55 11/14/2018 1220    Additional studies/ records that were reviewed today include:  Lexiscan 4/27/2021Study Highlights   The left ventricular ejection fraction is normal (55-65%).  Nuclear stress EF: 60%.  There was no ST segment deviation noted during stress.  No T wave inversion was noted during stress.  The study is normal for perfusion. Wall motion abnormalities are noted in the inferior and inferoseptal myocardium.  This is a low risk study.    Cardiac catheterization 11/2018Prox RCA to Mid RCA lesion is 100% stenosed.  Ost Cx to Prox Cx lesion is 100% stenosed.  Previously placed Ost LM to Ost LAD stent (unknown type) is widely patent.  Ost Ramus to Ramus lesion is 99% stenosed.  Prox LAD lesion is 100% stenosed.  Origin to Prox Graft lesion is 10% stenosed.  Origin lesion is 100% stenosed.  Origin lesion is 100% stenosed.  A drug-eluting stent was successfully placed using a STENT RESOLUTE ONYX 2.5X15.  Using a BALLOON EMERGE MR 2.0X12.  Post intervention, there is a 0% residual stenosis.   1. Severe 3 vessel occlusive CAD.    - 100% proximal LAD. 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    - 100% proximal RCA 2. Patent LIMA to  the LAD 3. Occluded SVG to ramus intermediate x 2 4. Patent SVG to distal RCA 5. Normal LVEDP 6. Successful stenting of the ramus intermediate with DES   Plan: continue DAPT for at least one year. Anticipate DC in  am.   Echo 11/2018 IMPRESSIONS     1. The left ventricle has normal systolic function with an ejection  fraction of 60-65%. The cavity size was normal. Left ventricular diastolic  Doppler parameters are indeterminate. There is abnormal septal motion  consistent with post-operative status.   2. The right ventricle has normal systolic function. The cavity was  normal. There is no increase in right ventricular wall thickness. Right  ventricular systolic pressure TR inadequate for RVSP assessment.   3. A Annuloplasty ring present valve is present in the mitral position.  Procedure Date: 2011.   4. Mild mitral valve stenosis.   5. S/p mitral valve annuloplasty. Mean gradient across valve ~4.3 mmHG @  HR 65 bpm. Gradients similar to prior study.   6. The tricuspid valve is grossly normal.   7. The aortic valve is tricuspid. No stenosis of the aortic valve.   8. The aorta is normal unless otherwise noted.   9. The aortic root and ascending aorta are normal in size and structure.  10. When compared to the prior study: No significant change from prior  study (12/04/2014).   FINDINGS   Left Ventricle: The left ventricle has normal systolic function, with an  ejection fraction of 60-65%. The cavity size was normal. There is  borderline increase in left ventricular wall thickness. Left ventricular  diastolic Doppler parameters are  indeterminate. There is abnormal (paradoxical) septal motion consistent  with post-operative status.   Right Ventricle: The right ventricle has normal systolic function. The  cavity was normal. There is no increase in right ventricular wall  thickness. Right ventricular systolic pressure TR inadequate for RVSP  assessment.   Left Atrium: Left atrial size was normal in size.   Right Atrium: Right atrial size was normal in size.   Interatrial Septum: No atrial level shunt detected by color flow Doppler.   Pericardium: There is no evidence of pericardial  effusion. There is a  pericardial fat pad noted.   Mitral Valve: The mitral valve has been repaired/replaced. Mitral valve  regurgitation is not visualized by color flow Doppler. Mild mitral valve  stenosis. A Annuloplasty ring present valve is present in the mitral  position. Procedure Date: 2011. S/p  mitral valve annuloplasty. Mean gradient across valve ~4.3 mmHG @ HR 65  bpm. Gradients similar to prior study.   Tricuspid Valve: The tricuspid valve is grossly normal. Tricuspid valve  regurgitation was not visualized by color flow Doppler.   Aortic Valve: The aortic valve is tricuspid Aortic valve regurgitation was  not visualized by color flow Doppler. There is No stenosis of the aortic  valve.   Pulmonic Valve: The pulmonic valve was grossly normal. Pulmonic valve  regurgitation is not visualized by color flow Doppler.   Aorta: The aortic root and ascending aorta are normal in size and  structure. The aorta is normal unless otherwise noted.   Venous: The inferior vena cava was not well visualized.   Compared to previous exam: No significant change from prior study  (12/04/2014).        ASSESSMENT:    1. Coronary artery disease involving coronary bypass graft of native heart without angina pectoris   2. History of mitral valve repair   3. Essential  hypertension   4. Chronic diastolic CHF (congestive heart failure) ([place])   5. Hyperlipidemia, unspecified hyperlipidemia type   6. Educated about COVID-19 virus infection      PLAN:  In order of problems listed above:  CAD status post NSTEMI followed by CABG in 2011, DES to the SVG to the PDA, native RI and protected left main 09/2016, DES to the RI secondary to in-stent restenosis 02/2017.  Recent chest pain with [facility] 07/29/2019 no ischemia normal LVEF 60%. Symptoms greatly improved on Imdur. ? Microvascular angina. Continue imdur,  150 min exercise weekly  Status post mitral valve repair 2011 echo  11/2018  Essential hypertension BP controlled  Chronic diastolic CHF compensated  Hyperlipidemia LDL 55 trig 285 11/2018  Educated on covid 19 vaccine [place]  Medication Adjustments/Labs and Tests Ordered: Current medicines are reviewed at length with the patient today.  Concerns regarding medicines are outlined above.  Medication changes, Labs and Tests ordered today are listed in the Patient Instructions below. Patient Instructions  Medication Instructions:  Your physician recommends that you continue on your current medications as directed. Please refer to the Current Medication list given to you today.  *If you need a refill on your cardiac medications before your next appointment, please call your pharmacy*   Lab Work: None ordered  If you have labs (blood work) drawn today and your tests are completely normal, you will receive your results only by: Marland Kitchen MyChart Message (if you have MyChart) OR . A paper copy in the mail If you have any lab test that is abnormal or we need to change your treatment, we will call you to review the results.   Testing/Procedures: None ordered   Follow-Up: At [facility], you and your health needs are our priority.  As part of our continuing mission to provide you with exceptional heart care, we have created designated Provider Care Teams.  These Care Teams include your primary Cardiologist (physician) and Advanced Practice Providers (APPs -  Physician Assistants and Nurse Practitioners) who all work together to provide you with the care you need, when you need it.  We recommend signing up for the patient portal called "MyChart".  Sign up information is provided on this After Visit Summary.  MyChart is used to connect with patients for Virtual Visits (Telemedicine).  Patients are able to view lab/test results, encounter notes, upcoming appointments, etc.  Non-urgent messages can be sent to your provider as well.   To learn more about what you can  do with MyChart, go to [email or web address].    Your next appointment:   6 month(s)  The format for your next appointment:   In Person  Provider:   You may see Lauree Chandler, MD or one of the following Advanced Practice Providers on your designated Care Team:    Melina Copa, PA-C  Ermalinda Barrios, PA-C    Other Instructions      Signed, Ermalinda Barrios, PA-C  07/30/2019 11:33 AM    [address], [place], [place]  [address] Phone: [phone]; Fax: [phone]

## 2019-07-30 ENCOUNTER — Ambulatory Visit (INDEPENDENT_AMBULATORY_CARE_PROVIDER_SITE_OTHER): Payer: Medicare Other | Admitting: Physician Assistant

## 2019-07-30 ENCOUNTER — Encounter: Payer: Self-pay | Admitting: Physician Assistant

## 2019-07-30 ENCOUNTER — Other Ambulatory Visit: Payer: Self-pay

## 2019-07-30 VITALS — BP 114/72 | HR 60 | Ht 64.0 in | Wt 206.0 lb

## 2019-07-30 DIAGNOSIS — I25810 Atherosclerosis of coronary artery bypass graft(s) without angina pectoris: Secondary | ICD-10-CM | POA: Diagnosis not present

## 2019-07-30 DIAGNOSIS — I5032 Chronic diastolic (congestive) heart failure: Secondary | ICD-10-CM

## 2019-07-30 DIAGNOSIS — I10 Essential (primary) hypertension: Secondary | ICD-10-CM

## 2019-07-30 DIAGNOSIS — Z9889 Other specified postprocedural states: Secondary | ICD-10-CM | POA: Diagnosis not present

## 2019-07-30 DIAGNOSIS — Z7189 Other specified counseling: Secondary | ICD-10-CM | POA: Diagnosis not present

## 2019-07-30 DIAGNOSIS — E785 Hyperlipidemia, unspecified: Secondary | ICD-10-CM

## 2019-07-30 NOTE — Patient Instructions (Signed)
Medication Instructions:  Your physician recommends that you continue on your current medications as directed. Please refer to the Current Medication list given to you today.  *If you need a refill on your cardiac medications before your next appointment, please call your pharmacy*   Lab Work: None ordered  If you have labs (blood work) drawn today and your tests are completely normal, you will receive your results only by: MyChart Message (if you have MyChart) OR A paper copy in the mail If you have any lab test that is abnormal or we need to change your treatment, we will call you to review the results.   Testing/Procedures: None ordered   Follow-Up: At [facility], you and your health needs are our priority.  As part of our continuing mission to provide you with exceptional heart care, we have created designated Provider Care Teams.  These Care Teams include your primary Cardiologist (physician) and Advanced Practice Providers (APPs -  Physician Assistants and Nurse Practitioners) who all work together to provide you with the care you need, when you need it.  We recommend signing up for the patient portal called "MyChart".  Sign up information is provided on this After Visit Summary.  MyChart is used to connect with patients for Virtual Visits (Telemedicine).  Patients are able to view lab/test results, encounter notes, upcoming appointments, etc.  Non-urgent messages can be sent to your provider as well.   To learn more about what you can do with MyChart, go to [email or web address].    Your next appointment:   6 month(s)  The format for your next appointment:   In Person  Provider:   You may see Christopher McAlhany, MD or one of the following Advanced Practice Providers on your designated Care Team:   Dayna Dunn, PA-C Michele Lenze, PA-C   Other Instructions   

## 2019-07-31 DIAGNOSIS — Z03818 Encounter for observation for suspected exposure to other biological agents ruled out: Secondary | ICD-10-CM | POA: Diagnosis not present

## 2019-08-05 DIAGNOSIS — M79661 Pain in right lower leg: Secondary | ICD-10-CM | POA: Diagnosis not present

## 2019-08-05 DIAGNOSIS — M545 Low back pain: Secondary | ICD-10-CM | POA: Diagnosis not present

## 2019-08-05 DIAGNOSIS — M79662 Pain in left lower leg: Secondary | ICD-10-CM | POA: Diagnosis not present

## 2019-08-05 DIAGNOSIS — G894 Chronic pain syndrome: Secondary | ICD-10-CM | POA: Diagnosis not present

## 2019-08-14 ENCOUNTER — Other Ambulatory Visit: Payer: Self-pay | Admitting: Cardiovascular Disease

## 2019-08-15 ENCOUNTER — Other Ambulatory Visit: Payer: Self-pay | Admitting: *Deleted

## 2019-08-15 MED ORDER — ZOLPIDEM TARTRATE 5 MG PO TABS: ORAL_TABLET | ORAL | 5 refills | Status: DC

## 2019-08-17 DIAGNOSIS — Z23 Encounter for immunization: Secondary | ICD-10-CM | POA: Diagnosis not present

## 2019-08-18 ENCOUNTER — Telehealth: Payer: Self-pay

## 2019-08-18 MED ORDER — ZOLPIDEM TARTRATE 5 MG PO TABS: ORAL_TABLET | ORAL | 5 refills | Status: DC

## 2019-08-18 NOTE — Telephone Encounter (Signed)
It is okay to fill this per Dr. Angelena Form.

## 2019-08-18 NOTE — Telephone Encounter (Signed)
Pt calling requesting a refill on Zolpidem (ambien) 5 mg tablet. This is not a Heart medication. Would Dr. Angelena Form like to refill this medication? Please address

## 2019-08-18 NOTE — Telephone Encounter (Signed)
Pt's medication zolpidem (ambien) 5 mg tablet was called into pt's pharmacy per Dr. Angelena Form. Pharmacist verbalized understanding.

## 2019-08-21 DIAGNOSIS — I10 Essential (primary) hypertension: Secondary | ICD-10-CM | POA: Diagnosis not present

## 2019-08-21 DIAGNOSIS — R809 Proteinuria, unspecified: Secondary | ICD-10-CM | POA: Diagnosis not present

## 2019-08-21 DIAGNOSIS — E1165 Type 2 diabetes mellitus with hyperglycemia: Secondary | ICD-10-CM | POA: Diagnosis not present

## 2019-08-21 DIAGNOSIS — E1140 Type 2 diabetes mellitus with diabetic neuropathy, unspecified: Secondary | ICD-10-CM | POA: Diagnosis not present

## 2019-08-29 DIAGNOSIS — Z03818 Encounter for observation for suspected exposure to other biological agents ruled out: Secondary | ICD-10-CM | POA: Diagnosis not present

## 2019-09-02 DIAGNOSIS — M79662 Pain in left lower leg: Secondary | ICD-10-CM | POA: Diagnosis not present

## 2019-09-02 DIAGNOSIS — G894 Chronic pain syndrome: Secondary | ICD-10-CM | POA: Diagnosis not present

## 2019-09-02 DIAGNOSIS — M79661 Pain in right lower leg: Secondary | ICD-10-CM | POA: Diagnosis not present

## 2019-09-02 DIAGNOSIS — M545 Low back pain: Secondary | ICD-10-CM | POA: Diagnosis not present

## 2019-09-14 DIAGNOSIS — Z23 Encounter for immunization: Secondary | ICD-10-CM | POA: Diagnosis not present

## 2019-09-29 ENCOUNTER — Other Ambulatory Visit: Payer: Self-pay | Admitting: Physician Assistant

## 2019-10-13 DIAGNOSIS — R809 Proteinuria, unspecified: Secondary | ICD-10-CM | POA: Diagnosis not present

## 2019-10-13 DIAGNOSIS — E1165 Type 2 diabetes mellitus with hyperglycemia: Secondary | ICD-10-CM | POA: Diagnosis not present

## 2019-10-13 DIAGNOSIS — I10 Essential (primary) hypertension: Secondary | ICD-10-CM | POA: Diagnosis not present

## 2019-10-13 DIAGNOSIS — E1140 Type 2 diabetes mellitus with diabetic neuropathy, unspecified: Secondary | ICD-10-CM | POA: Diagnosis not present

## 2019-10-20 DIAGNOSIS — I10 Essential (primary) hypertension: Secondary | ICD-10-CM | POA: Diagnosis not present

## 2019-10-20 DIAGNOSIS — N951 Menopausal and female climacteric states: Secondary | ICD-10-CM | POA: Diagnosis not present

## 2019-10-20 DIAGNOSIS — E559 Vitamin D deficiency, unspecified: Secondary | ICD-10-CM | POA: Diagnosis not present

## 2019-10-20 DIAGNOSIS — Z951 Presence of aortocoronary bypass graft: Secondary | ICD-10-CM | POA: Diagnosis not present

## 2019-10-20 DIAGNOSIS — E1165 Type 2 diabetes mellitus with hyperglycemia: Secondary | ICD-10-CM | POA: Diagnosis not present

## 2019-10-20 DIAGNOSIS — I2510 Atherosclerotic heart disease of native coronary artery without angina pectoris: Secondary | ICD-10-CM | POA: Diagnosis not present

## 2019-10-20 DIAGNOSIS — Z9889 Other specified postprocedural states: Secondary | ICD-10-CM | POA: Diagnosis not present

## 2019-10-23 DIAGNOSIS — R05 Cough: Secondary | ICD-10-CM | POA: Diagnosis not present

## 2019-10-25 DIAGNOSIS — R110 Nausea: Secondary | ICD-10-CM | POA: Diagnosis not present

## 2019-10-25 DIAGNOSIS — Z9229 Personal history of other drug therapy: Secondary | ICD-10-CM | POA: Diagnosis not present

## 2019-10-25 DIAGNOSIS — K2270 Barrett's esophagus without dysplasia: Secondary | ICD-10-CM | POA: Diagnosis not present

## 2019-10-31 DIAGNOSIS — I10 Essential (primary) hypertension: Secondary | ICD-10-CM | POA: Diagnosis not present

## 2019-10-31 DIAGNOSIS — Z951 Presence of aortocoronary bypass graft: Secondary | ICD-10-CM | POA: Diagnosis not present

## 2019-10-31 DIAGNOSIS — R05 Cough: Secondary | ICD-10-CM | POA: Diagnosis not present

## 2019-10-31 DIAGNOSIS — E1165 Type 2 diabetes mellitus with hyperglycemia: Secondary | ICD-10-CM | POA: Diagnosis not present

## 2019-11-05 DIAGNOSIS — E119 Type 2 diabetes mellitus without complications: Secondary | ICD-10-CM | POA: Diagnosis not present

## 2019-11-05 DIAGNOSIS — Z1211 Encounter for screening for malignant neoplasm of colon: Secondary | ICD-10-CM | POA: Diagnosis not present

## 2019-11-05 DIAGNOSIS — K2270 Barrett's esophagus without dysplasia: Secondary | ICD-10-CM | POA: Diagnosis not present

## 2019-11-05 DIAGNOSIS — Z01818 Encounter for other preprocedural examination: Secondary | ICD-10-CM | POA: Diagnosis not present

## 2019-11-06 DIAGNOSIS — M545 Low back pain: Secondary | ICD-10-CM | POA: Diagnosis not present

## 2019-11-06 DIAGNOSIS — M79661 Pain in right lower leg: Secondary | ICD-10-CM | POA: Diagnosis not present

## 2019-11-06 DIAGNOSIS — G894 Chronic pain syndrome: Secondary | ICD-10-CM | POA: Diagnosis not present

## 2019-11-06 DIAGNOSIS — M79662 Pain in left lower leg: Secondary | ICD-10-CM | POA: Diagnosis not present

## 2019-11-07 DIAGNOSIS — K2270 Barrett's esophagus without dysplasia: Secondary | ICD-10-CM | POA: Diagnosis not present

## 2019-11-07 DIAGNOSIS — K2970 Gastritis, unspecified, without bleeding: Secondary | ICD-10-CM | POA: Diagnosis not present

## 2019-11-07 DIAGNOSIS — A048 Other specified bacterial intestinal infections: Secondary | ICD-10-CM | POA: Diagnosis not present

## 2019-11-12 ENCOUNTER — Encounter: Payer: Self-pay | Admitting: Pulmonary Disease

## 2019-11-12 ENCOUNTER — Ambulatory Visit (INDEPENDENT_AMBULATORY_CARE_PROVIDER_SITE_OTHER): Payer: Medicare Other | Admitting: Pulmonary Disease

## 2019-11-12 ENCOUNTER — Other Ambulatory Visit: Payer: Self-pay

## 2019-11-12 VITALS — BP 122/68 | HR 61 | Temp 97.3°F | Ht 64.0 in | Wt 208.0 lb

## 2019-11-12 DIAGNOSIS — R5383 Other fatigue: Secondary | ICD-10-CM

## 2019-11-12 DIAGNOSIS — R0600 Dyspnea, unspecified: Secondary | ICD-10-CM

## 2019-11-12 DIAGNOSIS — R9389 Abnormal findings on diagnostic imaging of other specified body structures: Secondary | ICD-10-CM

## 2019-11-12 DIAGNOSIS — R0609 Other forms of dyspnea: Secondary | ICD-10-CM

## 2019-11-12 DIAGNOSIS — Z6835 Body mass index (BMI) 35.0-35.9, adult: Secondary | ICD-10-CM | POA: Diagnosis not present

## 2019-11-12 NOTE — Progress Notes (Signed)
Patient ID: Elizabeth Park, female    DOB: 1970-07-29, 49 y.o.   MRN: [id number]  Chief Complaint  Patient presents with  . Consult    follow up abnormal CT per Dr. Alan Ripper. c/o worsening sob with any exertion X4 mos.     Referring provider: Koleen Distance, MD  HPI: Elizabeth Park is a 49 year old woman, former smoker with coronary artery disease s/p CABG, PCI, mitral valve repair, hypertension, diabetes and chronic diastolic CHF who is referred to pulmonary clinic for abnormal CT chest and shortness of breath.   She reports feeling short of breath all the time since April. She denies cough, wheezing, or chest tightness. She was having chest pain in April in which a NM stress test was performed and deemed low risk. She was started on Imdur at this time with resolution of her chest pains, per Cardiology visit 07/30/19. She denies lower extremity swelling and denies weight gain over the last year.   She is able to perform all of her activities of daily living but notices the shortness of breath throughout the day. She does report waking up gasping for air at times. She does complain of daytime sleepiness.   She is a former smoker. Quit in 2010 and has a 28 pack year smoking history. Her maternal grandmother had emphysema.         TEST/EVENTS :  NM Myocardial Scan 07/29/19 reviewed, EF 60%, no ST segment or T wave inversions noted during stress. The study is normal for perfusion. Wall motion abnormalities are noted in the inferior and inferoseptal myocardium. Low risk study.   CT Chest 09/23/19 report reviewed, bronchiectatic changes at the left lung base. No interstitial or airspace opacities noted. No nodules reported. No pleural effusions. Small hiatal hernia.   Allergies  Allergen Reactions  . Metformin Diarrhea and Nausea And Vomiting    Immediate release tablets (not metformin ER, pt currently taking)    Immunization History  Administered Date(s) Administered  . Influenza Whole 04/26/2009    . Moderna SARS-COVID-2 Vaccination 08/17/2019, 09/14/2019  . Td 04/10/2008    Past Medical History:  Diagnosis Date  . Chronic diastolic CHF (congestive heart failure) ([place])   . Coronary artery disease    a. 4v CABG  (LIMA to LAD, SVG to subbranch of ramus intermediate, SVG to lateral subbranch of ramus intermediate, SVG to distal RCA) and mitral valve repair January 2011. b. Abnormal nuc 09/2016 s/p  drug eluting stent to the vein graft to the PDA, a drug eluting stent in the native Ramus intermediate branch and a drug eluting stent in the protected left main. c. DES to ramus intermediate 02/2017.   . Diabetes mellitus   . H/O emotional problems   . Headache, migraine   . High cholesterol   . History of blood transfusion   . History of nuclear stress test    Myoview 07/2019: EF 60, normal perfusion, low risk.  Marland Kitchen Hypertension   . MI, old   . MRSA (methicillin resistant Staphylococcus aureus)   . MVP (mitral valve prolapse)   . Neuromuscular disorder ([place])   . Neuropathy   . S/P mitral valve repair   . Sebaceous cyst     Tobacco History: Social History   Tobacco Use  Smoking Status Former Smoker  . Packs/day: 1.00  . Years: 28.00  . Pack years: 28.00  . Types: Cigarettes  . Quit date: 03/31/2009  . Years since quitting: 10.6  Smokeless Tobacco Never Used  Counseling given: Not Answered   Outpatient Medications Prior to Visit  Medication Sig Dispense Refill  . aspirin 81 MG tablet Take 81 mg by mouth daily.     . carvedilol (COREG) 3.125 MG tablet Take 1 tablet (3.125 mg total) by mouth 2 (two) times daily. 180 tablet 3  . clopidogrel (PLAVIX) 75 MG tablet TAKE 1 TABLET (75 MG TOTAL) BY MOUTH DAILY WITH BREAKFAST. 90 tablet 3  . ergocalciferol (VITAMIN D2) 1.25 MG (50000 UT) capsule Take 50,000 Units by mouth once a week.    . fluticasone (FLONASE) 50 MCG/ACT nasal spray Place 1 spray into both nostrils 2 (two) times daily.     Marland Kitchen gabapentin (NEURONTIN) 300 MG capsule  TAKE 1 CAPSULE BY MOUTH THREE TIMES A DAY    . insulin aspart (NOVOLOG FLEXPEN) 100 UNIT/ML injection Inject 0-12 Units into the skin See admin instructions. Per sliding scale Under 150 no units 150-199 take 8 units 200-249 take 10 units 250-299 take 12 units    . isosorbide mononitrate (IMDUR) 30 MG 24 hr tablet Take 0.5 tablets (15 mg total) by mouth daily. 45 tablet 1  . Lancets (ONETOUCH DELICA PLUS [id number]) MISC USE TO CHECK BLOOD SUGARS 3 TIMES A DAY (NEED PART B CARD)    . metFORMIN (GLUCOPHAGE-XR) 500 MG 24 hr tablet TAKE 1 TABLET BY MOUTH TWICE A DAY AFTER MEALS    . NARCAN 4 MG/0.1ML LIQD nasal spray kit     . nitroGLYCERIN (NITROSTAT) 0.4 MG SL tablet PLACE 1 TABLET UNDER THE TONGUE EVERY 5 MINUTES AS NEEDED FOR CHEST PAIN (UP TO 3 DOSES/15MIN) 75 tablet 2  . ONETOUCH VERIO test strip USE TO CHECK BLOOD SUGAR 3 TIMES A DAY (NEED PART B CARD)    . oxyCODONE-acetaminophen (PERCOCET) 10-325 MG per tablet Take 1 tablet by mouth 5 (five) times daily.     . pantoprazole (PROTONIX) 40 MG tablet Take 1 tablet (40 mg total) by mouth daily. 30 tablet 11  . rosuvastatin (CRESTOR) 40 MG tablet TAKE 1 TABLET (40 MG TOTAL) EVERY EVENING BY MOUTH. 90 tablet 3  . Semaglutide 1 MG/DOSE SOPN Inject 1 mg into the skin once a week. "Ozempic"    . TRESIBA FLEXTOUCH 100 UNIT/ML SOPN FlexTouch Pen Inject 40 Units as directed at bedtime.   4  . zolpidem (AMBIEN) 5 MG tablet TAKE 1 TABLET BY MOUTH EVERY DAY AT BEDTIME AS NEEDED FOR SLEEP 30 tablet 5   No facility-administered medications prior to visit.     Review of Systems:   Constitutional:   No  weight loss, night sweats,  Fevers, chills, fatigue, or  lassitude.  HEENT:   No headaches,  Difficulty swallowing,  Tooth/dental problems, or  Sore throat,                No sneezing, itching, ear ache, nasal congestion, post nasal drip,   CV:  No chest pain,  Orthopnea, PND, swelling in lower extremities, anasarca, dizziness, palpitations, syncope.    GI  No heartburn, indigestion, abdominal pain, nausea, vomiting, diarrhea, change in bowel habits, loss of appetite, bloody stools.   Resp: No shortness of breath with exertion or at rest.  No excess mucus, no productive cough,  No non-productive cough,  No coughing up of blood.  No change in color of mucus.  No wheezing.  No chest wall deformity  Skin: no rash or lesions.  GU: no dysuria, change in color of urine, no urgency or frequency.  No flank  pain, no hematuria   MS:  No joint pain or swelling.  No decreased range of motion.  No back pain.    Physical Exam  BP 122/68 (BP Location: Left Arm, Cuff Size: Normal)   Pulse 61   Temp (!) 97.3 F (36.3 C) (Temporal)   Ht 5' 4"  (1.626 m)   Wt 208 lb (94.3 kg)   SpO2 99%   BMI 35.70 kg/m   GEN: A/Ox3; pleasant , NAD, well nourished     HEENT:  [place]/AT,  EACs-clear, TMs-wnl, NOSE-clear, THROAT-clear, no lesions, no postnasal drip or exudate noted.   NECK:  Supple w/ fair ROM; no JVD; normal carotid impulses w/o bruits; no thyromegaly or nodules palpated; no lymphadenopathy.    RESP  Clear  P & A; w/o, wheezes/ rales/ or rhonchi. no accessory muscle use, no dullness to percussion  CARD:  RRR, no m/r/g, no peripheral edema, pulses intact, no cyanosis or clubbing.  GI:   Soft & nt; nml bowel sounds; no organomegaly or masses detected.   Musco: Warm bil, no deformities or joint swelling noted.   Neuro: alert, no focal deficits noted.    Skin: Warm, no lesions or rashes    Lab Results:  CBC    Component Value Date/Time   WBC 9.7 07/11/2019 1344   WBC 7.0 02/08/2017 0401   RBC 4.90 07/11/2019 1344   RBC 3.70 (L) 02/08/2017 0401   HGB 15.2 07/11/2019 1344   HCT 45.4 07/11/2019 1344   PLT 302 07/11/2019 1344   MCV 93 07/11/2019 1344   MCH 31.0 07/11/2019 1344   MCH 30.8 02/08/2017 0401   MCHC 33.5 07/11/2019 1344   MCHC 34.1 02/08/2017 0401   RDW 12.3 07/11/2019 1344   LYMPHSABS 2.1 09/21/2016 1040   MONOABS 0.7  01/24/2012 1245   EOSABS 0.1 09/21/2016 1040   BASOSABS 0.0 09/21/2016 1040    BMET    Component Value Date/Time   NA 137 07/11/2019 1344   K 4.7 07/11/2019 1344   CL 98 07/11/2019 1344   CO2 20 07/11/2019 1344   GLUCOSE 230 (H) 07/11/2019 1344   GLUCOSE 116 (H) 02/08/2017 0401   BUN 13 07/11/2019 1344   CREATININE 1.20 (H) 07/11/2019 1344   CALCIUM 10.4 (H) 07/11/2019 1344   GFRNONAA 53 (L) 07/11/2019 1344   GFRAA 61 07/11/2019 1344    Imaging: ECHO 11/20/2018 reviewed, EF 60-65%. RV normal systolic function. Mild mitral valve stenosis.    Assessment & Plan:  Elizabeth Park is a 49 year old woman, former smoker with coronary artery disease s/p CABG, PCI, mitral valve repair, hypertension, diabetes and chronic diastolic CHF who is referred to pulmonary clinic for abnormal CT chest and shortness of breath.   PFT on 11/13/19 reviewed. Her spirometry is suggestive of restrictive lung disease which is confirmed by lung volumes showing a mild restrictive defect. There is no significant bronchodilator response. Diffusion capacity is normal.   The mild restrictive defect is likely secondary to her sternotomy for CABG and also related to obesity. The CT Chest report from 09/23/19 does not note parenchymal disease and reports bronchiectatic changes at the left lung base.  Her dyspnea is likely multifactorial related to chronic diastolic CHF, obesity/deconditioning, and mild restrictive lung disease secondary to obesity and history of sternotomy for CABG. There is also concern for obstructive sleep apnea given her obesity, daytime sleepiness and night time awakenings gasping for air.  Plan: - Obtain CD of CT chest scan from [facility] from  10/23/2019 - Check home sleep study for concern of sleep apnea  - Will call patient with the results - Consider referral to the weight management clinic - Follow up in 3 months  Freddi Starr, MD 11/12/2019

## 2019-11-12 NOTE — Patient Instructions (Signed)
We will check a pulmonary function test for the shortness of breath and also check a home sleep study We will call you with these results  We will request a copy of the CT Chest scan from [facility] for further review

## 2019-11-13 ENCOUNTER — Ambulatory Visit (INDEPENDENT_AMBULATORY_CARE_PROVIDER_SITE_OTHER): Payer: Medicare Other | Admitting: Pulmonary Disease

## 2019-11-13 DIAGNOSIS — R0609 Other forms of dyspnea: Secondary | ICD-10-CM

## 2019-11-13 DIAGNOSIS — R0600 Dyspnea, unspecified: Secondary | ICD-10-CM

## 2019-11-13 LAB — PULMONARY FUNCTION TEST
DL/VA % pred: 121 %
DL/VA: 5.25 ml/min/mmHg/L
DLCO cor % pred: 83 %
DLCO cor: 17.67 ml/min/mmHg
DLCO unc % pred: 83 %
DLCO unc: 17.67 ml/min/mmHg
FEF 25-75 Post: 4.01 L/sec
FEF 25-75 Pre: 3.25 L/sec
FEF2575-%Change-Post: 23 %
FEF2575-%Pred-Post: 145 %
FEF2575-%Pred-Pre: 117 %
FEV1-%Change-Post: 4 %
FEV1-%Pred-Post: 74 %
FEV1-%Pred-Pre: 71 %
FEV1-Post: 2.09 L
FEV1-Pre: 2.01 L
FEV1FVC-%Change-Post: 0 %
FEV1FVC-%Pred-Pre: 113 %
FEV6-%Change-Post: 3 %
FEV6-%Pred-Post: 66 %
FEV6-%Pred-Pre: 64 %
FEV6-Post: 2.29 L
FEV6-Pre: 2.21 L
FEV6FVC-%Pred-Post: 102 %
FEV6FVC-%Pred-Pre: 102 %
FVC-%Change-Post: 3 %
FVC-%Pred-Post: 64 %
FVC-%Pred-Pre: 62 %
FVC-Post: 2.29 L
FVC-Pre: 2.21 L
Post FEV1/FVC ratio: 91 %
Post FEV6/FVC ratio: 100 %
Pre FEV1/FVC ratio: 91 %
Pre FEV6/FVC Ratio: 100 %
RV % pred: 86 %
RV: 1.54 L
TLC % pred: 74 %
TLC: 3.78 L

## 2019-11-13 NOTE — Progress Notes (Signed)
Full PFT performed today. °

## 2019-11-21 ENCOUNTER — Other Ambulatory Visit: Payer: Self-pay | Admitting: Cardiovascular Disease

## 2019-11-26 DIAGNOSIS — Z01818 Encounter for other preprocedural examination: Secondary | ICD-10-CM | POA: Diagnosis not present

## 2019-11-26 DIAGNOSIS — R110 Nausea: Secondary | ICD-10-CM | POA: Diagnosis not present

## 2019-11-26 DIAGNOSIS — Z1211 Encounter for screening for malignant neoplasm of colon: Secondary | ICD-10-CM | POA: Diagnosis not present

## 2019-11-26 DIAGNOSIS — K2270 Barrett's esophagus without dysplasia: Secondary | ICD-10-CM | POA: Diagnosis not present

## 2019-12-03 DIAGNOSIS — E1165 Type 2 diabetes mellitus with hyperglycemia: Secondary | ICD-10-CM | POA: Diagnosis not present

## 2019-12-03 DIAGNOSIS — R809 Proteinuria, unspecified: Secondary | ICD-10-CM | POA: Diagnosis not present

## 2019-12-03 DIAGNOSIS — E1140 Type 2 diabetes mellitus with diabetic neuropathy, unspecified: Secondary | ICD-10-CM | POA: Diagnosis not present

## 2019-12-03 DIAGNOSIS — I10 Essential (primary) hypertension: Secondary | ICD-10-CM | POA: Diagnosis not present

## 2019-12-04 DIAGNOSIS — G894 Chronic pain syndrome: Secondary | ICD-10-CM | POA: Diagnosis not present

## 2019-12-04 DIAGNOSIS — M79662 Pain in left lower leg: Secondary | ICD-10-CM | POA: Diagnosis not present

## 2019-12-04 DIAGNOSIS — M79661 Pain in right lower leg: Secondary | ICD-10-CM | POA: Diagnosis not present

## 2019-12-04 DIAGNOSIS — M545 Low back pain: Secondary | ICD-10-CM | POA: Diagnosis not present

## 2019-12-15 ENCOUNTER — Telehealth: Payer: Self-pay | Admitting: Pulmonary Disease

## 2019-12-15 NOTE — Telephone Encounter (Signed)
Called and spoke with patient about CT disc from [place].  Dr. Erin Fulling have you received this disc?

## 2019-12-16 NOTE — Telephone Encounter (Signed)
I have not received the disc with the CT chest images on it.

## 2019-12-16 NOTE — Telephone Encounter (Signed)
Spoke with pt, advised that we have not received CT disk from [facility].  Pt will reach out to try and get a copy of this disk.  Will await disk.

## 2019-12-19 ENCOUNTER — Other Ambulatory Visit: Payer: Self-pay

## 2019-12-19 ENCOUNTER — Ambulatory Visit: Payer: Medicare Other

## 2019-12-19 DIAGNOSIS — G4733 Obstructive sleep apnea (adult) (pediatric): Secondary | ICD-10-CM | POA: Diagnosis not present

## 2019-12-19 DIAGNOSIS — R5383 Other fatigue: Secondary | ICD-10-CM

## 2019-12-23 NOTE — Telephone Encounter (Signed)
Elizabeth Park, has this disc been received yet? Thanks.

## 2019-12-24 NOTE — Telephone Encounter (Addendum)
Spoke with patient regarding prior message. Advised patient I did look in Dr.Dewald Boxes to see the patient's disc has arrived. Nothing that I seen. I advised patient I will send Dr.Dewald a message regarding patient's disc. Patient  did give the number to [facility] [phone] for our office to call any try to get patient's disc.  Dr.Dewald can you please advise if you received patient's disc .  Thank you

## 2019-12-25 DIAGNOSIS — G4733 Obstructive sleep apnea (adult) (pediatric): Secondary | ICD-10-CM | POA: Diagnosis not present

## 2019-12-29 NOTE — Telephone Encounter (Signed)
I have not received the disc yet.

## 2020-01-01 ENCOUNTER — Other Ambulatory Visit: Payer: Self-pay | Admitting: Physician Assistant

## 2020-01-05 ENCOUNTER — Other Ambulatory Visit: Payer: Self-pay | Admitting: Cardiology

## 2020-01-05 DIAGNOSIS — G894 Chronic pain syndrome: Secondary | ICD-10-CM | POA: Diagnosis not present

## 2020-01-05 DIAGNOSIS — M79662 Pain in left lower leg: Secondary | ICD-10-CM | POA: Diagnosis not present

## 2020-01-05 DIAGNOSIS — M79661 Pain in right lower leg: Secondary | ICD-10-CM | POA: Diagnosis not present

## 2020-01-05 DIAGNOSIS — M545 Low back pain, unspecified: Secondary | ICD-10-CM | POA: Diagnosis not present

## 2020-01-05 DIAGNOSIS — Z1231 Encounter for screening mammogram for malignant neoplasm of breast: Secondary | ICD-10-CM

## 2020-01-06 ENCOUNTER — Ambulatory Visit
Admission: RE | Admit: 2020-01-06 | Discharge: 2020-01-06 | Disposition: A | Discharge status: Home / Self Care | Payer: Medicare Other | Source: Ambulatory Visit | Attending: Cardiology | Admitting: Cardiology

## 2020-01-06 ENCOUNTER — Other Ambulatory Visit: Payer: Self-pay

## 2020-01-06 DIAGNOSIS — Z1231 Encounter for screening mammogram for malignant neoplasm of breast: Secondary | ICD-10-CM

## 2020-01-08 DIAGNOSIS — R140 Abdominal distension (gaseous): Secondary | ICD-10-CM | POA: Diagnosis not present

## 2020-01-08 DIAGNOSIS — R110 Nausea: Secondary | ICD-10-CM | POA: Diagnosis not present

## 2020-01-13 ENCOUNTER — Telehealth: Payer: Self-pay | Admitting: Pulmonary Disease

## 2020-01-13 NOTE — Telephone Encounter (Signed)
Please let the patient know that her sleep study shows mild obstructive sleep apnea. We will refer to her one of our sleep specialists for further evaluation and treatment.   If she would like to start CPAP therapy sooner, please place an order to her DME company for CPAP machine, mask and supplies with an adjustable starting pressure of 5-14cmH2O.   Thanks, Wille Glaser

## 2020-01-15 NOTE — Telephone Encounter (Signed)
ATC number listed below to reach [facility] medical. However, there was an automated message stating, 'there are no agents available please try your call again at a later time.'  WCB.

## 2020-01-19 NOTE — Telephone Encounter (Signed)
Spoke with the pt and notified of results per Dr Erin Fulling  She verbalized understanding  She wants appt with sleep MD before we order CPAP  Appt with Dr Ander Slade scheduled for 01/21/20

## 2020-01-21 ENCOUNTER — Institutional Professional Consult (permissible substitution): Payer: Medicare Other | Admitting: Pulmonary Disease

## 2020-01-22 ENCOUNTER — Telehealth: Payer: Self-pay | Admitting: Pulmonary Disease

## 2020-01-22 NOTE — Telephone Encounter (Signed)
Called and spoke with pt letting her know that Dr. Erin Fulling received the disc of her CT scan and reviewed it. Stated to her that she could come by to pick the disc up and keep for her records if she liked and she stated she would come by Monday 10/25 to pick up the disc.  Pt wants to know what the results of the CT were. Dr. Erin Fulling, please advise.

## 2020-01-22 NOTE — Telephone Encounter (Signed)
Routing to Dr. Erin Fulling as an Juluis Rainier.

## 2020-01-22 NOTE — Telephone Encounter (Signed)
I received the CT Chest CD in my review folder today. I have reviewed the scan and the patient can come pick up the disc when she would like.  Thanks, Wille Glaser

## 2020-01-22 NOTE — Telephone Encounter (Signed)
[facility] and spoke to a receptionist and was advised they do not have a technician that can burn the images onto a disc. She states she will ask her supervisor if there is any other way this can be done and get back to [place]. Will keep message open to follow up on.   Will forward to Dr. Erin Fulling as Juluis Rainier.

## 2020-01-23 NOTE — Telephone Encounter (Signed)
The CT of her lungs overall looks normal. The area of concern by the original read on the scan is not a major contributor to her symptoms of shortness of breath.

## 2020-01-23 NOTE — Telephone Encounter (Signed)
Called and spoke with pt letting her know the results of the  CT per Dr. Erin Fulling and pt verbalized understanding.  Dr. Erin Fulling, please advise where the disc of pt's CT is so that way we can be able to put it up front for pt in an envelope with her name and DOB on it.

## 2020-01-25 NOTE — Progress Notes (Signed)
Chief Complaint  Patient presents with  . Follow-up    CAD, chest pani    History of Present Illness: 49 yo female with history of CAD s/p 4V CABG 2011, mitral regurgitation s/p repair, chronic diastolic CHF, DM, HTN and hyperlipidemia who is here today for cardiac follow up. Cardiac cath  In December 2010 showed severe proximal LAD stenosis, severe mid intermediate disease with occluded mid RCA and anomalous Circumflex with severe disease. She also had severe mitral regurgitation. She underwent a 4V CABG  (LIMA to LAD, SVG to subbranch of ramus intermediate, SVG to lateral subbranch of ramus intermediate, SVG to distal RCA) and mitral valve repair January 2011. Echo September 2016 with normal LV systolic function, stable mitral valve repair. She had chest pain in June 2018 and her nuclear stress test showed ischemia. Cardiac cath on 09/22/16 showed progression of her CAD. LV function was normal. I placed a drug eluting stent in the vein graft to the PDA, a drug eluting stent in the native Ramus intermediate branch and a drug eluting stent in the protected left main. Repeat cardiac cath 02/07/17 and found to have continued occlusion of all three native vessels with patent LIMA to LAD, occluded SVG to ramus x 2, patent SVG to PDA but severe restenosis in the ramus intermediate stent. This was treated with another drug eluting stent. Her BP was soft in the hospital so Norvasc was stopped.  Coreg dose lowered at office visit in February 2019 due to fatigue. Echo August 2020 with LVEF=60-65%, mitral valve repair intact with mild stenosis. She had chest pain in April 2021. Nuclear stress test April 2021  without ischemia. Imdur was added and her chest pain resolved.   She is here today for follow up. She has had acceleration of dyspnea on exertion with associated chest pain, similar to prior angina. No palpitations, lower extremity edema, orthopnea, PND, dizziness, near syncope or syncope.   Primary Care  Physician: Gwendel Hanson  Past Medical History:  Diagnosis Date  . Chronic diastolic CHF (congestive heart failure) ([place])   . Coronary artery disease    a. 4v CABG  (LIMA to LAD, SVG to subbranch of ramus intermediate, SVG to lateral subbranch of ramus intermediate, SVG to distal RCA) and mitral valve repair January 2011. b. Abnormal nuc 09/2016 s/p  drug eluting stent to the vein graft to the PDA, a drug eluting stent in the native Ramus intermediate branch and a drug eluting stent in the protected left main. c. DES to ramus intermediate 02/2017.   . Diabetes mellitus   . H/O emotional problems   . Headache, migraine   . High cholesterol   . History of blood transfusion   . History of nuclear stress test    Myoview 07/2019: EF 60, normal perfusion, low risk.  Marland Kitchen Hypertension   . MI, old   . MRSA (methicillin resistant Staphylococcus aureus)   . MVP (mitral valve prolapse)   . Neuromuscular disorder ([place])   . Neuropathy   . S/P mitral valve repair   . Sebaceous cyst     Past Surgical History:  Procedure Laterality Date  . ABDOMINAL HYSTERECTOMY    . CARDIAC CATHETERIZATION  09/22/2016  . CORONARY ARTERY BYPASS GRAFT  with valve repair  . CORONARY STENT INTERVENTION N/A 09/22/2016   Procedure: Coronary Stent Intervention;  Surgeon: Burnell Blanks, MD;  Location: [facility];  Service: Cardiovascular;  Laterality: N/A;  . CORONARY STENT INTERVENTION N/A  02/07/2017   Procedure: CORONARY STENT INTERVENTION;  Surgeon: Martinique, Peter M, MD;  Location: [facility];  Service: Cardiovascular;  Laterality: N/A;  . CORONARY STENT PLACEMENT  09/22/2016  . LEFT HEART CATH AND CORS/GRAFTS ANGIOGRAPHY N/A 09/22/2016   Procedure: Left Heart Cath and Cors/Grafts Angiography;  Surgeon: Burnell Blanks, MD;  Location: [facility];  Service: Cardiovascular;  Laterality: N/A;  . LEFT HEART CATH AND CORS/GRAFTS ANGIOGRAPHY N/A 02/07/2017   Procedure: LEFT HEART  CATH AND CORS/GRAFTS ANGIOGRAPHY;  Surgeon: Martinique, Peter M, MD;  Location: [facility];  Service: Cardiovascular;  Laterality: N/A;  . SKIN GRAFT      Current Outpatient Medications  Medication Sig Dispense Refill  . aspirin 81 MG tablet Take 81 mg by mouth daily.     . carvedilol (COREG) 3.125 MG tablet TAKE 1 TABLET BY MOUTH 2 TIMES DAILY. 180 tablet 1  . clopidogrel (PLAVIX) 75 MG tablet TAKE 1 TABLET (75 MG TOTAL) BY MOUTH DAILY WITH BREAKFAST. 90 tablet 3  . ergocalciferol (VITAMIN D2) 1.25 MG (50000 UT) capsule Take 50,000 Units by mouth once a week.    . gabapentin (NEURONTIN) 300 MG capsule TAKE 1 CAPSULE BY MOUTH THREE TIMES A DAY    . insulin aspart (NOVOLOG FLEXPEN) 100 UNIT/ML injection Inject 0-12 Units into the skin See admin instructions. Per sliding scale Under 150 no units 150-199 take 8 units 200-249 take 10 units 250-299 take 12 units    . isosorbide mononitrate (IMDUR) 30 MG 24 hr tablet TAKE 0.5 TABLETS (15 MG TOTAL) BY MOUTH DAILY. 45 tablet 1  . Lancets (ONETOUCH DELICA PLUS [id number]) MISC USE TO CHECK BLOOD SUGARS 3 TIMES A DAY (NEED PART B CARD)    . metFORMIN (GLUCOPHAGE-XR) 500 MG 24 hr tablet TAKE 1 TABLET BY MOUTH TWICE A DAY AFTER MEALS    . NARCAN 4 MG/0.1ML LIQD nasal spray kit     . nitroGLYCERIN (NITROSTAT) 0.4 MG SL tablet PLACE 1 TABLET UNDER THE TONGUE EVERY 5 MINUTES AS NEEDED FOR CHEST PAIN (UP TO 3 DOSES/15MIN) 75 tablet 2  . ondansetron (ZOFRAN) 4 MG tablet Take 4 mg by mouth 3 (three) times daily.    Glory Rosebush VERIO test strip USE TO CHECK BLOOD SUGAR 3 TIMES A DAY (NEED PART B CARD)    . oxyCODONE-acetaminophen (PERCOCET) 10-325 MG per tablet Take 1 tablet by mouth 5 (five) times daily.     . pantoprazole (PROTONIX) 40 MG tablet Take 1 tablet (40 mg total) by mouth daily. 30 tablet 11  . rosuvastatin (CRESTOR) 40 MG tablet TAKE 1 TABLET (40 MG TOTAL) EVERY EVENING BY MOUTH. 90 tablet 3  . Semaglutide 1 MG/DOSE SOPN Inject 1 mg into the  skin once a week. "Ozempic"    . TRESIBA FLEXTOUCH 100 UNIT/ML SOPN FlexTouch Pen Inject 40 Units as directed at bedtime.   4  . zolpidem (AMBIEN) 5 MG tablet TAKE 1 TABLET BY MOUTH EVERY DAY AT BEDTIME AS NEEDED FOR SLEEP 30 tablet 5   No current facility-administered medications for this visit.    Allergies  Allergen Reactions  . Metformin Diarrhea and Nausea And Vomiting    Immediate release tablets (not metformin ER, pt currently taking)    Social History   Socioeconomic History  . Marital status: Divorced    Spouse name: Not on file  . Number of children: 2  . Years of education: Not on file  . Highest education level: Not on  file  Occupational History  . Occupation: Disability  Tobacco Use  . Smoking status: Former Smoker    Packs/day: 1.00    Years: 28.00    Pack years: 28.00    Types: Cigarettes    Quit date: 03/31/2009    Years since quitting: 10.8  . Smokeless tobacco: Never Used  Vaping Use  . Vaping Use: Never used  Substance and Sexual Activity  . Alcohol use: No  . Drug use: No  . Sexual activity: Yes    Birth control/protection: Surgical    Comment: partial hysterectomy  Other Topics Concern  . Not on file  Social History Narrative  . Not on file   Social Determinants of Health   Financial Resource Strain:   . Difficulty of Paying Living Expenses: Not on file  Food Insecurity:   . Worried About Charity fundraiser in the Last Year: Not on file  . Ran Out of Food in the Last Year: Not on file  Transportation Needs:   . Lack of Transportation (Medical): Not on file  . Lack of Transportation (Non-Medical): Not on file  Physical Activity:   . Days of Exercise per Week: Not on file  . Minutes of Exercise per Session: Not on file  Stress:   . Feeling of Stress : Not on file  Social Connections:   . Frequency of Communication with Friends and Family: Not on file  . Frequency of Social Gatherings with Friends and Family: Not on file  . Attends  Religious Services: Not on file  . Active Member of Clubs or Organizations: Not on file  . Attends Archivist Meetings: Not on file  . Marital Status: Not on file  Intimate Partner Violence:   . Fear of Current or Ex-Partner: Not on file  . Emotionally Abused: Not on file  . Physically Abused: Not on file  . Sexually Abused: Not on file    Family History  Problem Relation Age of Onset  . Heart disease Father   . Asthma Father   . Hypertension Father   . Diabetes Father   . Arthritis Father   . COPD Father   . Heart disease Mother   . Hypertension Mother   . Diabetes Mother   . Migraines Mother   . Stroke Mother   . Emphysema Maternal Grandmother   . Hypertension Brother   . Diabetes Brother   . Breast cancer Neg Hx     Review of Systems:  As stated in the HPI and otherwise negative.   BP 122/62   Pulse 69   Ht _0  (1.626 m)   Wt 209 lb 12.8 oz (95.2 kg)   SpO2 95%   BMI 36.01 kg/m   Physical Examination:  General: Well developed, well nourished, NAD  HEENT: OP clear, mucus membranes moist  SKIN: warm, dry. No rashes. Neuro: No focal deficits  Musculoskeletal: Muscle strength 5/5 all ext  Psychiatric: Mood and affect normal  Neck: No JVD, no carotid bruits, no thyromegaly, no lymphadenopathy.  Lungs:Clear bilaterally, no wheezes, rhonci, crackles Cardiovascular: Regular rate and rhythm. No murmurs, gallops or rubs. Abdomen:Soft. Bowel sounds present. Non-tender.  Extremities: No lower extremity edema. Pulses are 2 + in the bilateral DP/PT.  Echo August 2020: 1. The left ventricle has normal systolic function with an ejection  fraction of 60-65%. The cavity size was normal. Left ventricular diastolic  Doppler parameters are indeterminate. There is abnormal septal motion  consistent with post-operative status.  2. The right ventricle has normal systolic function. The cavity was  normal. There is no increase in right ventricular wall thickness.  Right  ventricular systolic pressure TR inadequate for RVSP assessment.  3. A Annuloplasty ring present valve is present in the mitral position.  Procedure Date: 2011.  4. Mild mitral valve stenosis.  5. S/p mitral valve annuloplasty. Mean gradient across valve ~4.3 mmHG @  HR 65 bpm. Gradients similar to prior study.  6. The tricuspid valve is grossly normal.  7. The aortic valve is tricuspid. No stenosis of the aortic valve.  8. The aorta is normal unless otherwise noted.  9. The aortic root and ascending aorta are normal in size and structure.  10. When compared to the prior study: No significant change from prior  study (12/04/2014).   EKG:  EKG is ordered today. The ekg ordered today demonstrates sinus  Recent Labs: 07/11/2019: BUN 13; Creatinine, Ser 1.20; Hemoglobin 15.2; Platelets 302; Potassium 4.7; Sodium 137   Lipid Panel  Lipid Panel     Component Value Date/Time   CHOL 148 11/14/2018 1220   TRIG 285 (H) 11/14/2018 1220   HDL 36 (L) 11/14/2018 1220   CHOLHDL 4.1 11/14/2018 1220   CHOLHDL 8.9 04/03/2009 0320   VLDL 54 (H) 04/03/2009 0320   LDLCALC 55 11/14/2018 1220    Wt Readings from Last 3 Encounters:  01/26/20 209 lb 12.8 oz (95.2 kg)  11/12/19 208 lb (94.3 kg)  07/30/19 206 lb (93.4 kg)     Other studies Reviewed: Additional studies/ records that were reviewed today include: . Review of the above records demonstrates:    Assessment and Plan:   1. CAD with unstable angina: She has chest pain c/w unstable angina. Will plan cardiac cath at [facility] 01/30/20 with possible PCI.  I have reviewed the risks, indications, and alternatives to cardiac catheterization, possible angioplasty, and stenting with the patient. Risks include but are not limited to bleeding, infection, vascular injury, stroke, myocardial infection, arrhythmia, kidney injury, radiation-related injury in the case of prolonged fluoroscopy use, emergency cardiac surgery, and death. The patient  understands the risks of serious complication is 1-2 in 9604 with diagnostic cardiac cath and 1-2% or less with angioplasty/stenting. Continue ASA, Plavix, statin, beta blocker and Imdur. .     2. HYPERLIPIDEMIA: LDL at goal in August 2020. Repeat lipids and LFTs now. Continue statin  3. HYPERTENSION: BP is controlled. Continue current therapy  4. TOBACCO ABUSE, in remission: She stopped smoking in 2010.   5. Mitral regurgitation: Echo in 2020 showed that the mitral valve repair was stable with mild stenosis  6. Chronic diastolic CHF: Weight is stable. No volume overload on exam.   7. Leg pain: ABI normal November 2018  8. Insomnia: Continue Ambien  Current medicines are reviewed at length with the patient today.  The patient does not have concerns regarding medicines.  The following changes have been made:  no change  Labs/ tests ordered today include:   Orders Placed This Encounter  Procedures  . EKG 12-Lead    Disposition:   FU with me post cath   Signed, Lauree Chandler, MD 01/26/2020 11:57 AM    [facility] [address], [place], [place]  [address] Phone: [phone]; Fax: [phone]

## 2020-01-25 NOTE — H&P (View-Only) (Signed)
Chief Complaint  Patient presents with  . Follow-up    CAD, chest pani    History of Present Illness: 49 yo female with history of CAD s/p 4V CABG 2011, mitral regurgitation s/p repair, chronic diastolic CHF, DM, HTN and hyperlipidemia who is here today for cardiac follow up. Cardiac cath  In December 2010 showed severe proximal LAD stenosis, severe mid intermediate disease with occluded mid RCA and anomalous Circumflex with severe disease. She also had severe mitral regurgitation. She underwent a 4V CABG  (LIMA to LAD, SVG to subbranch of ramus intermediate, SVG to lateral subbranch of ramus intermediate, SVG to distal RCA) and mitral valve repair January 2011. Echo September 2016 with normal LV systolic function, stable mitral valve repair. She had chest pain in June 2018 and her nuclear stress test showed ischemia. Cardiac cath on 09/22/16 showed progression of her CAD. LV function was normal. I placed a drug eluting stent in the vein graft to the PDA, a drug eluting stent in the native Ramus intermediate branch and a drug eluting stent in the protected left main. Repeat cardiac cath 02/07/17 and found to have continued occlusion of all three native vessels with patent LIMA to LAD, occluded SVG to ramus x 2, patent SVG to PDA but severe restenosis in the ramus intermediate stent. This was treated with another drug eluting stent. Her BP was soft in the hospital so Norvasc was stopped.  Coreg dose lowered at office visit in February 2019 due to fatigue. Echo August 2020 with LVEF=60-65%, mitral valve repair intact with mild stenosis. She had chest pain in April 2021. Nuclear stress test April 2021  without ischemia. Imdur was added and her chest pain resolved.   She is here today for follow up. She has had acceleration of dyspnea on exertion with associated chest pain, similar to prior angina. No palpitations, lower extremity edema, orthopnea, PND, dizziness, near syncope or syncope.   Primary Care  Physician: Gwendel Hanson  Past Medical History:  Diagnosis Date  . Chronic diastolic CHF (congestive heart failure) ([place])   . Coronary artery disease    a. 4v CABG  (LIMA to LAD, SVG to subbranch of ramus intermediate, SVG to lateral subbranch of ramus intermediate, SVG to distal RCA) and mitral valve repair January 2011. b. Abnormal nuc 09/2016 s/p  drug eluting stent to the vein graft to the PDA, a drug eluting stent in the native Ramus intermediate branch and a drug eluting stent in the protected left main. c. DES to ramus intermediate 02/2017.   . Diabetes mellitus   . H/O emotional problems   . Headache, migraine   . High cholesterol   . History of blood transfusion   . History of nuclear stress test    Myoview 07/2019: EF 60, normal perfusion, low risk.  Marland Kitchen Hypertension   . MI, old   . MRSA (methicillin resistant Staphylococcus aureus)   . MVP (mitral valve prolapse)   . Neuromuscular disorder ([place])   . Neuropathy   . S/P mitral valve repair   . Sebaceous cyst     Past Surgical History:  Procedure Laterality Date  . ABDOMINAL HYSTERECTOMY    . CARDIAC CATHETERIZATION  09/22/2016  . CORONARY ARTERY BYPASS GRAFT  with valve repair  . CORONARY STENT INTERVENTION N/A 09/22/2016   Procedure: Coronary Stent Intervention;  Surgeon: Burnell Blanks, MD;  Location: [facility];  Service: Cardiovascular;  Laterality: N/A;  . CORONARY STENT INTERVENTION N/A  02/07/2017   Procedure: CORONARY STENT INTERVENTION;  Surgeon: Martinique, Peter M, MD;  Location: [facility];  Service: Cardiovascular;  Laterality: N/A;  . CORONARY STENT PLACEMENT  09/22/2016  . LEFT HEART CATH AND CORS/GRAFTS ANGIOGRAPHY N/A 09/22/2016   Procedure: Left Heart Cath and Cors/Grafts Angiography;  Surgeon: Burnell Blanks, MD;  Location: [facility];  Service: Cardiovascular;  Laterality: N/A;  . LEFT HEART CATH AND CORS/GRAFTS ANGIOGRAPHY N/A 02/07/2017   Procedure: LEFT HEART  CATH AND CORS/GRAFTS ANGIOGRAPHY;  Surgeon: Martinique, Peter M, MD;  Location: [facility];  Service: Cardiovascular;  Laterality: N/A;  . SKIN GRAFT      Current Outpatient Medications  Medication Sig Dispense Refill  . aspirin 81 MG tablet Take 81 mg by mouth daily.     . carvedilol (COREG) 3.125 MG tablet TAKE 1 TABLET BY MOUTH 2 TIMES DAILY. 180 tablet 1  . clopidogrel (PLAVIX) 75 MG tablet TAKE 1 TABLET (75 MG TOTAL) BY MOUTH DAILY WITH BREAKFAST. 90 tablet 3  . ergocalciferol (VITAMIN D2) 1.25 MG (50000 UT) capsule Take 50,000 Units by mouth once a week.    . gabapentin (NEURONTIN) 300 MG capsule TAKE 1 CAPSULE BY MOUTH THREE TIMES A DAY    . insulin aspart (NOVOLOG FLEXPEN) 100 UNIT/ML injection Inject 0-12 Units into the skin See admin instructions. Per sliding scale Under 150 no units 150-199 take 8 units 200-249 take 10 units 250-299 take 12 units    . isosorbide mononitrate (IMDUR) 30 MG 24 hr tablet TAKE 0.5 TABLETS (15 MG TOTAL) BY MOUTH DAILY. 45 tablet 1  . Lancets (ONETOUCH DELICA PLUS [id number]) MISC USE TO CHECK BLOOD SUGARS 3 TIMES A DAY (NEED PART B CARD)    . metFORMIN (GLUCOPHAGE-XR) 500 MG 24 hr tablet TAKE 1 TABLET BY MOUTH TWICE A DAY AFTER MEALS    . NARCAN 4 MG/0.1ML LIQD nasal spray kit     . nitroGLYCERIN (NITROSTAT) 0.4 MG SL tablet PLACE 1 TABLET UNDER THE TONGUE EVERY 5 MINUTES AS NEEDED FOR CHEST PAIN (UP TO 3 DOSES/15MIN) 75 tablet 2  . ondansetron (ZOFRAN) 4 MG tablet Take 4 mg by mouth 3 (three) times daily.    Glory Rosebush VERIO test strip USE TO CHECK BLOOD SUGAR 3 TIMES A DAY (NEED PART B CARD)    . oxyCODONE-acetaminophen (PERCOCET) 10-325 MG per tablet Take 1 tablet by mouth 5 (five) times daily.     . pantoprazole (PROTONIX) 40 MG tablet Take 1 tablet (40 mg total) by mouth daily. 30 tablet 11  . rosuvastatin (CRESTOR) 40 MG tablet TAKE 1 TABLET (40 MG TOTAL) EVERY EVENING BY MOUTH. 90 tablet 3  . Semaglutide 1 MG/DOSE SOPN Inject 1 mg into the  skin once a week. "Ozempic"    . TRESIBA FLEXTOUCH 100 UNIT/ML SOPN FlexTouch Pen Inject 40 Units as directed at bedtime.   4  . zolpidem (AMBIEN) 5 MG tablet TAKE 1 TABLET BY MOUTH EVERY DAY AT BEDTIME AS NEEDED FOR SLEEP 30 tablet 5   No current facility-administered medications for this visit.    Allergies  Allergen Reactions  . Metformin Diarrhea and Nausea And Vomiting    Immediate release tablets (not metformin ER, pt currently taking)    Social History   Socioeconomic History  . Marital status: Divorced    Spouse name: Not on file  . Number of children: 2  . Years of education: Not on file  . Highest education level: Not on  file  Occupational History  . Occupation: Disability  Tobacco Use  . Smoking status: Former Smoker    Packs/day: 1.00    Years: 28.00    Pack years: 28.00    Types: Cigarettes    Quit date: 03/31/2009    Years since quitting: 10.8  . Smokeless tobacco: Never Used  Vaping Use  . Vaping Use: Never used  Substance and Sexual Activity  . Alcohol use: No  . Drug use: No  . Sexual activity: Yes    Birth control/protection: Surgical    Comment: partial hysterectomy  Other Topics Concern  . Not on file  Social History Narrative  . Not on file   Social Determinants of Health   Financial Resource Strain:   . Difficulty of Paying Living Expenses: Not on file  Food Insecurity:   . Worried About Charity fundraiser in the Last Year: Not on file  . Ran Out of Food in the Last Year: Not on file  Transportation Needs:   . Lack of Transportation (Medical): Not on file  . Lack of Transportation (Non-Medical): Not on file  Physical Activity:   . Days of Exercise per Week: Not on file  . Minutes of Exercise per Session: Not on file  Stress:   . Feeling of Stress : Not on file  Social Connections:   . Frequency of Communication with Friends and Family: Not on file  . Frequency of Social Gatherings with Friends and Family: Not on file  . Attends  Religious Services: Not on file  . Active Member of Clubs or Organizations: Not on file  . Attends Archivist Meetings: Not on file  . Marital Status: Not on file  Intimate Partner Violence:   . Fear of Current or Ex-Partner: Not on file  . Emotionally Abused: Not on file  . Physically Abused: Not on file  . Sexually Abused: Not on file    Family History  Problem Relation Age of Onset  . Heart disease Father   . Asthma Father   . Hypertension Father   . Diabetes Father   . Arthritis Father   . COPD Father   . Heart disease Mother   . Hypertension Mother   . Diabetes Mother   . Migraines Mother   . Stroke Mother   . Emphysema Maternal Grandmother   . Hypertension Brother   . Diabetes Brother   . Breast cancer Neg Hx     Review of Systems:  As stated in the HPI and otherwise negative.   BP 122/62   Pulse 69   Ht _0  (1.626 m)   Wt 209 lb 12.8 oz (95.2 kg)   SpO2 95%   BMI 36.01 kg/m   Physical Examination:  General: Well developed, well nourished, NAD  HEENT: OP clear, mucus membranes moist  SKIN: warm, dry. No rashes. Neuro: No focal deficits  Musculoskeletal: Muscle strength 5/5 all ext  Psychiatric: Mood and affect normal  Neck: No JVD, no carotid bruits, no thyromegaly, no lymphadenopathy.  Lungs:Clear bilaterally, no wheezes, rhonci, crackles Cardiovascular: Regular rate and rhythm. No murmurs, gallops or rubs. Abdomen:Soft. Bowel sounds present. Non-tender.  Extremities: No lower extremity edema. Pulses are 2 + in the bilateral DP/PT.  Echo August 2020: 1. The left ventricle has normal systolic function with an ejection  fraction of 60-65%. The cavity size was normal. Left ventricular diastolic  Doppler parameters are indeterminate. There is abnormal septal motion  consistent with post-operative status.  2. The right ventricle has normal systolic function. The cavity was  normal. There is no increase in right ventricular wall thickness.  Right  ventricular systolic pressure TR inadequate for RVSP assessment.  3. A Annuloplasty ring present valve is present in the mitral position.  Procedure Date: 2011.  4. Mild mitral valve stenosis.  5. S/p mitral valve annuloplasty. Mean gradient across valve ~4.3 mmHG @  HR 65 bpm. Gradients similar to prior study.  6. The tricuspid valve is grossly normal.  7. The aortic valve is tricuspid. No stenosis of the aortic valve.  8. The aorta is normal unless otherwise noted.  9. The aortic root and ascending aorta are normal in size and structure.  10. When compared to the prior study: No significant change from prior  study (12/04/2014).   EKG:  EKG is ordered today. The ekg ordered today demonstrates sinus  Recent Labs: 07/11/2019: BUN 13; Creatinine, Ser 1.20; Hemoglobin 15.2; Platelets 302; Potassium 4.7; Sodium 137   Lipid Panel  Lipid Panel     Component Value Date/Time   CHOL 148 11/14/2018 1220   TRIG 285 (H) 11/14/2018 1220   HDL 36 (L) 11/14/2018 1220   CHOLHDL 4.1 11/14/2018 1220   CHOLHDL 8.9 04/03/2009 0320   VLDL 54 (H) 04/03/2009 0320   LDLCALC 55 11/14/2018 1220    Wt Readings from Last 3 Encounters:  01/26/20 209 lb 12.8 oz (95.2 kg)  11/12/19 208 lb (94.3 kg)  07/30/19 206 lb (93.4 kg)     Other studies Reviewed: Additional studies/ records that were reviewed today include: . Review of the above records demonstrates:    Assessment and Plan:   1. CAD with unstable angina: She has chest pain c/w unstable angina. Will plan cardiac cath at [facility] 01/30/20 with possible PCI.  I have reviewed the risks, indications, and alternatives to cardiac catheterization, possible angioplasty, and stenting with the patient. Risks include but are not limited to bleeding, infection, vascular injury, stroke, myocardial infection, arrhythmia, kidney injury, radiation-related injury in the case of prolonged fluoroscopy use, emergency cardiac surgery, and death. The patient  understands the risks of serious complication is 1-2 in 9604 with diagnostic cardiac cath and 1-2% or less with angioplasty/stenting. Continue ASA, Plavix, statin, beta blocker and Imdur. .     2. HYPERLIPIDEMIA: LDL at goal in August 2020. Repeat lipids and LFTs now. Continue statin  3. HYPERTENSION: BP is controlled. Continue current therapy  4. TOBACCO ABUSE, in remission: She stopped smoking in 2010.   5. Mitral regurgitation: Echo in 2020 showed that the mitral valve repair was stable with mild stenosis  6. Chronic diastolic CHF: Weight is stable. No volume overload on exam.   7. Leg pain: ABI normal November 2018  8. Insomnia: Continue Ambien  Current medicines are reviewed at length with the patient today.  The patient does not have concerns regarding medicines.  The following changes have been made:  no change  Labs/ tests ordered today include:   Orders Placed This Encounter  Procedures  . EKG 12-Lead    Disposition:   FU with me post cath   Signed, Lauree Chandler, MD 01/26/2020 11:57 AM    [facility] [address], [place], [place]  [address] Phone: [phone]; Fax: [phone]

## 2020-01-26 ENCOUNTER — Ambulatory Visit (INDEPENDENT_AMBULATORY_CARE_PROVIDER_SITE_OTHER): Payer: Medicare Other | Admitting: Cardiovascular Disease

## 2020-01-26 ENCOUNTER — Encounter: Payer: Self-pay | Admitting: Cardiovascular Disease

## 2020-01-26 ENCOUNTER — Other Ambulatory Visit: Payer: Self-pay

## 2020-01-26 VITALS — BP 122/62 | HR 69 | Ht 64.0 in | Wt 209.8 lb

## 2020-01-26 DIAGNOSIS — I25110 Atherosclerotic heart disease of native coronary artery with unstable angina pectoris: Secondary | ICD-10-CM

## 2020-01-26 DIAGNOSIS — I5032 Chronic diastolic (congestive) heart failure: Secondary | ICD-10-CM

## 2020-01-26 DIAGNOSIS — I10 Essential (primary) hypertension: Secondary | ICD-10-CM | POA: Diagnosis not present

## 2020-01-26 DIAGNOSIS — E7800 Pure hypercholesterolemia, unspecified: Secondary | ICD-10-CM | POA: Diagnosis not present

## 2020-01-26 DIAGNOSIS — Z9889 Other specified postprocedural states: Secondary | ICD-10-CM

## 2020-01-26 DIAGNOSIS — Z01812 Encounter for preprocedural laboratory examination: Secondary | ICD-10-CM | POA: Diagnosis not present

## 2020-01-26 DIAGNOSIS — I25810 Atherosclerosis of coronary artery bypass graft(s) without angina pectoris: Secondary | ICD-10-CM

## 2020-01-26 LAB — LIPID PANEL
Chol/HDL Ratio: 3.9 ratio (ref 0.0–4.4)
Cholesterol, Total: 136 mg/dL (ref 100–199)
HDL: 35 mg/dL — ABNORMAL LOW (ref >39)
LDL Chol Calc (NIH): 68 mg/dL (ref 0–99)
Triglycerides: 200 mg/dL — ABNORMAL HIGH (ref 0–149)
VLDL Cholesterol Cal: 33 mg/dL (ref 5–40)

## 2020-01-26 LAB — BASIC METABOLIC PANEL
BUN/Creatinine Ratio: 13 (ref 9–23)
BUN: 15 mg/dL (ref 6–24)
CO2: 22 mmol/L (ref 20–29)
Calcium: 9.8 mg/dL (ref 8.7–10.2)
Chloride: 102 mmol/L (ref 96–106)
Creatinine, Ser: 1.14 mg/dL — ABNORMAL HIGH (ref 0.57–1.00)
GFR calc Af Amer: 64 mL/min/{1.73_m2} (ref >59)
GFR calc non Af Amer: 56 mL/min/{1.73_m2} — ABNORMAL LOW (ref >59)
Glucose: 208 mg/dL — ABNORMAL HIGH (ref 65–99)
Potassium: 4.7 mmol/L (ref 3.5–5.2)
Sodium: 136 mmol/L (ref 134–144)

## 2020-01-26 LAB — HEPATIC FUNCTION PANEL
ALT: 18 IU/L (ref 0–32)
AST: 16 IU/L (ref 0–40)
Albumin: 4.4 g/dL (ref 3.8–4.9)
Alkaline Phosphatase: 66 IU/L (ref 44–121)
Bilirubin Total: 0.4 mg/dL (ref 0.0–1.2)
Bilirubin, Direct: 0.14 mg/dL (ref 0.00–0.40)
Total Protein: 7.4 g/dL (ref 6.0–8.5)

## 2020-01-26 LAB — CBC
Hematocrit: 43.7 % (ref 34.0–46.6)
Hemoglobin: 14.7 g/dL (ref 11.1–15.9)
MCH: 31.3 pg (ref 26.6–33.0)
MCHC: 33.6 g/dL (ref 31.5–35.7)
MCV: 93 fL (ref 79–97)
Platelets: 297 10*3/uL (ref 150–450)
RBC: 4.7 x10E6/uL (ref 3.77–5.28)
RDW: 12.6 % (ref 11.7–15.4)
WBC: 7.8 10*3/uL (ref 3.4–10.8)

## 2020-01-26 NOTE — Telephone Encounter (Signed)
[place] has the disc of the CT scan. The disc is labeled with the patient's info.

## 2020-01-26 NOTE — Patient Instructions (Signed)
Medication Instructions:  The current medical regimen is effective;  continue present plan and medications.  *If you need a refill on your cardiac medications before your next appointment, please call your pharmacy*  Lab Work: Please have blood work today (BMP, CBC, Lipid, Hepatic panel) If you have labs (blood work) drawn today and your tests are completely normal, you will receive your results only by: Marland Kitchen MyChart Message (if you have MyChart) OR . A paper copy in the mail If you have any lab test that is abnormal or we need to change your treatment, we will call you to review the results.   Testing/Procedures:   [facility] OFFICE [address], [address] [place] [address] Dept: [phone] Loc: [phone]  [place] KAGAN HIETPAS  01/26/2020  You are scheduled for a Cardiac Cath on Friday,  January 30, 2020 with Dr. Angelena Form.  1. Please arrive at the [facility] (Main Entrance A) at [facility]: [place], [place] [address] at 8:30 AM (two hours before your procedure to ensure your preparation). Free valet parking service is available.   Special note: Every effort is made to have your procedure done on time. Please understand that emergencies sometimes delay scheduled procedures.  2. Diet: Nothing after midnight  3. Labs: Please have blood work today. (BMP, CBC, Lipid and hepatic)  You will need Covid-19 screening as scheduled on Wednesday.  4. Medication instructions in preparation for your procedure:  On the morning of your procedure, Do Not take your Metformin and hold it 48 hours after your procedure.  You may take 1/2 normal dose if insulin.  You may take all other morning medications using sips of water.  5. Plan for one night stay--bring personal belongings. 6. Bring a current list of your medications and current insurance cards. 7. You MUST have a responsible person to  drive you home. 8. Someone MUST be with you the first 24 hours after you arrive home or your discharge will be delayed. 9. Please wear clothes that are easy to get on and off and wear slip-on shoes.  Thank you for allowing [place] to care for you!   -- [facility]  Follow-Up: At [facility], you and your health needs are our priority.  As part of our continuing mission to provide you with exceptional heart care, we have created designated Provider Care Teams.  These Care Teams include your primary Cardiologist (physician) and Advanced Practice Providers (APPs -  Physician Assistants and Nurse Practitioners) who all work together to provide you with the care you need, when you need it.  We recommend signing up for the patient portal called "MyChart".  Sign up information is provided on this After Visit Summary.  MyChart is used to connect with patients for Virtual Visits (Telemedicine).  Patients are able to view lab/test results, encounter notes, upcoming appointments, etc.  Non-urgent messages can be sent to your provider as well.   To learn more about what you can do with MyChart, go to [email or web address].    Your next appointment:   4 week(s)  The format for your next appointment:   In Person  Provider:   Lauree Chandler, MD   Thank you for choosing [facility]!!

## 2020-01-26 NOTE — Addendum Note (Signed)
Addended by: Shellia Cleverly on: 01/26/2020 12:20 PM   Modules accepted: Orders

## 2020-01-27 NOTE — Telephone Encounter (Signed)
ATC patient unable to reach. Left detailed message that the disc was up front for pick at her convienence   Nothing further needed at this time.

## 2020-01-28 ENCOUNTER — Other Ambulatory Visit (HOSPITAL_COMMUNITY)
Admission: RE | Admit: 2020-01-28 | Discharge: 2020-01-28 | Disposition: A | Discharge status: Home / Self Care | Payer: Medicare Other | Source: Ambulatory Visit | Attending: Cardiovascular Disease | Admitting: Cardiovascular Disease

## 2020-01-28 DIAGNOSIS — Z20822 Contact with and (suspected) exposure to covid-19: Secondary | ICD-10-CM | POA: Diagnosis not present

## 2020-01-28 DIAGNOSIS — Z01812 Encounter for preprocedural laboratory examination: Secondary | ICD-10-CM | POA: Insufficient documentation

## 2020-01-28 LAB — SARS CORONAVIRUS 2 (TAT 6-24 HRS): SARS Coronavirus 2: NEGATIVE

## 2020-01-29 ENCOUNTER — Telehealth: Payer: Self-pay | Admitting: *Deleted

## 2020-01-29 NOTE — Telephone Encounter (Signed)
Pt contacted pre-catheterization scheduled at [facility] for: Friday January 30, 2020 10:30 AM Verified arrival time and place: [facility]) at: 8:30 AM   No solid food after midnight prior to cath, clear liquids until 5 AM day of procedure.  Hold: Metformin-day of procedure and 48 hours  Insulin-AM of procedure/1/2 usual Tresiba HS prior to procedure  Except hold medications AM meds can be  taken pre-cath with sips of water including: ASA 81 mg Plavix 75 mg  Confirmed patient has responsible adult to drive home post procedure and be with patient first 24 hours after arriving home: yes  You are allowed ONE visitor in the waiting room during the time you are at the hospital for your procedure. Both you and your visitor must wear a mask once you enter the hospital.       COVID-19 Pre-Screening Questions:  . In the past 14 days have you had a new cough, new headache, new nasal congestion, fever (100.4 or greater) unexplained body aches, new sore throat, or sudden loss of taste or sense of smell? no . In the past 14 days have you been around anyone with known Covid 19? no . Have you been vaccinated for COVID-19? Yes, see immunization history  Reviewed procedure/mask/visitor instructions, COVID-19 questions with patient.

## 2020-01-30 ENCOUNTER — Encounter (HOSPITAL_COMMUNITY)
Admission: RE | Disposition: A | Discharge status: Home / Self Care | Payer: Self-pay | Source: Home / Self Care | Attending: Cardiovascular Disease

## 2020-01-30 ENCOUNTER — Ambulatory Visit (HOSPITAL_COMMUNITY)
Admission: RE | Admit: 2020-01-30 | Discharge: 2020-01-30 | Disposition: A | Discharge status: Home / Self Care | Payer: Medicare Other | Attending: Cardiovascular Disease | Admitting: Cardiovascular Disease

## 2020-01-30 DIAGNOSIS — Z8249 Family history of ischemic heart disease and other diseases of the circulatory system: Secondary | ICD-10-CM | POA: Diagnosis not present

## 2020-01-30 DIAGNOSIS — Z79899 Other long term (current) drug therapy: Secondary | ICD-10-CM | POA: Diagnosis not present

## 2020-01-30 DIAGNOSIS — I110 Hypertensive heart disease with heart failure: Secondary | ICD-10-CM | POA: Insufficient documentation

## 2020-01-30 DIAGNOSIS — I25110 Atherosclerotic heart disease of native coronary artery with unstable angina pectoris: Secondary | ICD-10-CM

## 2020-01-30 DIAGNOSIS — F17200 Nicotine dependence, unspecified, uncomplicated: Secondary | ICD-10-CM | POA: Diagnosis present

## 2020-01-30 DIAGNOSIS — Z7982 Long term (current) use of aspirin: Secondary | ICD-10-CM | POA: Diagnosis not present

## 2020-01-30 DIAGNOSIS — Z794 Long term (current) use of insulin: Secondary | ICD-10-CM | POA: Insufficient documentation

## 2020-01-30 DIAGNOSIS — I340 Nonrheumatic mitral (valve) insufficiency: Secondary | ICD-10-CM | POA: Insufficient documentation

## 2020-01-30 DIAGNOSIS — Z888 Allergy status to other drugs, medicaments and biological substances status: Secondary | ICD-10-CM | POA: Diagnosis not present

## 2020-01-30 DIAGNOSIS — Z7902 Long term (current) use of antithrombotics/antiplatelets: Secondary | ICD-10-CM | POA: Insufficient documentation

## 2020-01-30 DIAGNOSIS — I5032 Chronic diastolic (congestive) heart failure: Secondary | ICD-10-CM | POA: Diagnosis not present

## 2020-01-30 DIAGNOSIS — Z95820 Peripheral vascular angioplasty status with implants and grafts: Secondary | ICD-10-CM

## 2020-01-30 DIAGNOSIS — I252 Old myocardial infarction: Secondary | ICD-10-CM | POA: Diagnosis not present

## 2020-01-30 DIAGNOSIS — E785 Hyperlipidemia, unspecified: Secondary | ICD-10-CM | POA: Diagnosis present

## 2020-01-30 DIAGNOSIS — I10 Essential (primary) hypertension: Secondary | ICD-10-CM | POA: Diagnosis present

## 2020-01-30 DIAGNOSIS — Z87891 Personal history of nicotine dependence: Secondary | ICD-10-CM | POA: Insufficient documentation

## 2020-01-30 DIAGNOSIS — E1140 Type 2 diabetes mellitus with diabetic neuropathy, unspecified: Secondary | ICD-10-CM | POA: Insufficient documentation

## 2020-01-30 DIAGNOSIS — Z952 Presence of prosthetic heart valve: Secondary | ICD-10-CM | POA: Insufficient documentation

## 2020-01-30 DIAGNOSIS — Z955 Presence of coronary angioplasty implant and graft: Secondary | ICD-10-CM | POA: Insufficient documentation

## 2020-01-30 DIAGNOSIS — Z951 Presence of aortocoronary bypass graft: Secondary | ICD-10-CM | POA: Diagnosis not present

## 2020-01-30 DIAGNOSIS — I200 Unstable angina: Secondary | ICD-10-CM | POA: Diagnosis present

## 2020-01-30 DIAGNOSIS — E118 Type 2 diabetes mellitus with unspecified complications: Secondary | ICD-10-CM | POA: Diagnosis present

## 2020-01-30 DIAGNOSIS — Z9889 Other specified postprocedural states: Secondary | ICD-10-CM

## 2020-01-30 DIAGNOSIS — G4700 Insomnia, unspecified: Secondary | ICD-10-CM | POA: Insufficient documentation

## 2020-01-30 DIAGNOSIS — I080 Rheumatic disorders of both mitral and aortic valves: Secondary | ICD-10-CM | POA: Diagnosis present

## 2020-01-30 DIAGNOSIS — I2582 Chronic total occlusion of coronary artery: Secondary | ICD-10-CM | POA: Diagnosis not present

## 2020-01-30 HISTORY — PX: CORONARY STENT INTERVENTION: CATH118234

## 2020-01-30 HISTORY — PX: LEFT HEART CATH AND CORS/GRAFTS ANGIOGRAPHY: CATH118250

## 2020-01-30 LAB — POCT ACTIVATED CLOTTING TIME
Activated Clotting Time: 285 seconds
Activated Clotting Time: 334 seconds

## 2020-01-30 LAB — GLUCOSE, CAPILLARY
Glucose-Capillary: 119 mg/dL — ABNORMAL HIGH (ref 70–99)
Glucose-Capillary: 122 mg/dL — ABNORMAL HIGH (ref 70–99)

## 2020-01-30 SURGERY — LEFT HEART CATH AND CORS/GRAFTS ANGIOGRAPHY
Anesthesia: LOCAL

## 2020-01-30 MED ORDER — FENTANYL CITRATE (PF) 100 MCG/2ML IJ SOLN
INTRAMUSCULAR | Status: DC | PRN
  Administered 2020-01-30: 25 ug via INTRAVENOUS
  Administered 2020-01-30 (×2): 50 ug via INTRAVENOUS

## 2020-01-30 MED ORDER — HEPARIN SODIUM (PORCINE) 1000 UNIT/ML IJ SOLN
INTRAMUSCULAR | Status: AC
  Filled 2020-01-30: qty 1

## 2020-01-30 MED ORDER — HEPARIN (PORCINE) IN NACL 1000-0.9 UT/500ML-% IV SOLN
INTRAVENOUS | Status: AC
  Filled 2020-01-30: qty 1000

## 2020-01-30 MED ORDER — ONDANSETRON HCL 4 MG/2ML IJ SOLN: 4.0000 mg | Freq: Four times a day (QID) | INTRAMUSCULAR | Status: DC | PRN

## 2020-01-30 MED ORDER — VERAPAMIL HCL 2.5 MG/ML IV SOLN
INTRAVENOUS | Status: AC
  Filled 2020-01-30: qty 2

## 2020-01-30 MED ORDER — MIDAZOLAM HCL 2 MG/2ML IJ SOLN
INTRAMUSCULAR | Status: AC
  Filled 2020-01-30: qty 2

## 2020-01-30 MED ORDER — HYDRALAZINE HCL 20 MG/ML IJ SOLN: 10.0000 mg | INTRAMUSCULAR | Status: AC | PRN

## 2020-01-30 MED ORDER — SODIUM CHLORIDE 0.9% FLUSH: 3.0000 mL | INTRAVENOUS | Status: DC | PRN

## 2020-01-30 MED ORDER — FENTANYL CITRATE (PF) 100 MCG/2ML IJ SOLN
INTRAMUSCULAR | Status: AC
  Filled 2020-01-30: qty 2

## 2020-01-30 MED ORDER — MIDAZOLAM HCL 2 MG/2ML IJ SOLN
INTRAMUSCULAR | Status: DC | PRN
  Administered 2020-01-30: 1 mg via INTRAVENOUS
  Administered 2020-01-30: 2 mg via INTRAVENOUS

## 2020-01-30 MED ORDER — SODIUM CHLORIDE 0.9 % IV SOLN: INTRAVENOUS | Status: AC

## 2020-01-30 MED ORDER — SODIUM CHLORIDE 0.9% FLUSH: 3.0000 mL | Freq: Two times a day (BID) | INTRAVENOUS | Status: DC

## 2020-01-30 MED ORDER — SODIUM CHLORIDE 0.9 % WEIGHT BASED INFUSION
3.0000 mL/kg/h | INTRAVENOUS | Status: AC
  Administered 2020-01-30: 3 mL/kg/h via INTRAVENOUS

## 2020-01-30 MED ORDER — SODIUM CHLORIDE 0.9 % WEIGHT BASED INFUSION: 1.0000 mL/kg/h | INTRAVENOUS | Status: DC

## 2020-01-30 MED ORDER — VERAPAMIL HCL 2.5 MG/ML IV SOLN
INTRAVENOUS | Status: DC | PRN
  Administered 2020-01-30 (×2): 10 mL via INTRA_ARTERIAL

## 2020-01-30 MED ORDER — SODIUM CHLORIDE 0.9 % IV SOLN: 250.0000 mL | INTRAVENOUS | Status: DC | PRN

## 2020-01-30 MED ORDER — LIDOCAINE HCL (PF) 1 % IJ SOLN
INTRAMUSCULAR | Status: AC
  Filled 2020-01-30: qty 30

## 2020-01-30 MED ORDER — HEPARIN SODIUM (PORCINE) 1000 UNIT/ML IJ SOLN
INTRAMUSCULAR | Status: DC | PRN
  Administered 2020-01-30: 5000 [IU] via INTRAVENOUS
  Administered 2020-01-30: 3000 [IU] via INTRAVENOUS
  Administered 2020-01-30: 5000 [IU] via INTRAVENOUS

## 2020-01-30 MED ORDER — IOHEXOL 350 MG/ML SOLN
INTRAVENOUS | Status: DC | PRN
  Administered 2020-01-30: 160 mL

## 2020-01-30 MED ORDER — LABETALOL HCL 5 MG/ML IV SOLN: 10.0000 mg | INTRAVENOUS | Status: AC | PRN

## 2020-01-30 MED ORDER — NITROGLYCERIN 1 MG/10 ML FOR IR/CATH LAB
INTRA_ARTERIAL | Status: DC | PRN
  Administered 2020-01-30: 200 ug via INTRACORONARY

## 2020-01-30 MED ORDER — NITROGLYCERIN 1 MG/10 ML FOR IR/CATH LAB
INTRA_ARTERIAL | Status: AC
  Filled 2020-01-30: qty 10

## 2020-01-30 MED ORDER — HEPARIN (PORCINE) IN NACL 1000-0.9 UT/500ML-% IV SOLN
INTRAVENOUS | Status: DC | PRN
  Administered 2020-01-30 (×2): 500 mL

## 2020-01-30 MED ORDER — LIDOCAINE HCL (PF) 1 % IJ SOLN
INTRAMUSCULAR | Status: DC | PRN
  Administered 2020-01-30: 2 mL

## 2020-01-30 MED ORDER — ACETAMINOPHEN 325 MG PO TABS: 650.0000 mg | ORAL_TABLET | ORAL | Status: DC | PRN

## 2020-01-30 MED ORDER — ASPIRIN 81 MG PO CHEW: 81.0000 mg | CHEWABLE_TABLET | ORAL | Status: AC

## 2020-01-30 SURGICAL SUPPLY — 25 items
BALLN SAPPHIRE 2.0X12 (BALLOONS) ×2
BALLN SAPPHIRE ~~LOC~~ 2.75X8 (BALLOONS) ×2 IMPLANT
BALLN WOLVERINE 2.50X10 (BALLOONS) ×2
BALLOON SAPPHIRE 2.0X12 (BALLOONS) ×1 IMPLANT
BALLOON WOLVERINE 2.50X10 (BALLOONS) ×1 IMPLANT
CATH INFINITI 5 FR IM (CATHETERS) ×2 IMPLANT
CATH INFINITI 5FR MULTPACK ANG (CATHETERS) ×2 IMPLANT
CATH LAUNCHER 5F EBU3.5 (CATHETERS) ×2 IMPLANT
CATH LAUNCHER 6FR AL1 (CATHETERS) ×1 IMPLANT
CATHETER LAUNCHER 6FR AL1 (CATHETERS) ×2
DEVICE RAD COMP TR BAND LRG (VASCULAR PRODUCTS) ×2 IMPLANT
GLIDESHEATH SLEND SS 6F .021 (SHEATH) ×2 IMPLANT
GUIDEWIRE INQWIRE 1.5J.035X260 (WIRE) ×1 IMPLANT
INQWIRE 1.5J .035X260CM (WIRE) ×2
KIT ENCORE 26 ADVANTAGE (KITS) ×2 IMPLANT
KIT HEART LEFT (KITS) ×2 IMPLANT
PACK CARDIAC CATHETERIZATION (CUSTOM PROCEDURE TRAY) ×2 IMPLANT
SHEATH PROBE COVER 6X72 (BAG) ×2 IMPLANT
STENT RESOLUTE ONYX 2.5X12 (Permanent Stent) ×2 IMPLANT
STENT SYNERGY XD 2.50X12 (Permanent Stent) ×1 IMPLANT
SYNERGY XD 2.50X12 (Permanent Stent) ×2 IMPLANT
TRANSDUCER W/STOPCOCK (MISCELLANEOUS) ×2 IMPLANT
TUBING CIL FLEX 10 FLL-RA (TUBING) ×2 IMPLANT
WIRE COUGAR XT STRL 190CM (WIRE) ×2 IMPLANT
WIRE HI TORQ VERSACORE-J 145CM (WIRE) ×2 IMPLANT

## 2020-01-30 NOTE — Discharge Summary (Addendum)
Discharge Summary    Patient ID: Elizabeth Park MRN: [id number]; DOB: Oct 29, 1970  Admit date: 01/30/2020 Discharge date: 01/30/2020  Primary Care Provider: Secundino Ginger, PA-C  Primary Cardiologist: Lauree Chandler, MD  Primary Electrophysiologist:  None   Discharge Diagnoses    Principal Problem:   Coronary artery disease involving native coronary artery of native heart with unstable angina pectoris [facility]) Active Problems:   Type 2 diabetes mellitus with complication ([place])   Hyperlipidemia   TOBACCO ABUSE   MITRAL REGURGITATION   Essential hypertension   S/P mitral valve repair    Diagnostic Studies/Procedures    Cath 01/20/2020  Previously placed LM drug eluting stent is widely patent.  Balloon angioplasty was performed.  Previously placed Ost Ramus to Ramus drug eluting stent is widely patent.  Balloon angioplasty was performed.  Lat Ramus lesion is 100% stenosed.  Prox RCA lesion is 100% stenosed.  Dist RCA lesion is 100% stenosed.  SVG and is normal in caliber.  Previously placed Origin to Prox Graft drug eluting stent is widely patent.  SVG.  Mid Graft lesion is 100% stenosed.  SVG.  Origin lesion is 100% stenosed.  LIMA and is normal in caliber.  RPAV lesion is 99% stenosed.  A drug-eluting stent was successfully placed using a STENT RESOLUTE ONYX 2.5X12.  Post intervention, there is a 0% residual stenosis.  1st Diag lesion is 99% stenosed.  Previously placed Dist LM to Prox LAD stent (unknown type) is widely patent.  SVG graft was not injected.  Origin to Prox Graft lesion is 100% stenosed.  A drug-eluting stent was successfully placed using a SYNERGY XD 2.50X12.  Post intervention, there is a 0% residual stenosis.   1. Severe triple vessel CAD s/p 4/4 CABG with 2/4 patent grafts 2. 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 3. The RCA is a large dominant vessel with chronic  proximal total occlusion. The vein graft to the distal RCA is patent with a patent stent in the proximal body of the graft. Severe stenosis native RCA in the posterolateral branch filling via the vein graft.  4. Successful PTCA/DES x 1 posterolateral artery through the vein graft.  5. The intermediate is a large branch with a long proximal stented segment. The distal end of the stented segment has severe restenosis.  6. Successful PTCA/DES x 1 mid intermediate branch.  7.  The origin of the left vessel is essentially a long segment that gives rise to the LAD proper and intermediate branch. The proximal segment of this vessel has a patent stented segment. The LAD is protected by the LIMA graft.   Recommendations: Continue current therapy. Continue DAPT with ASA and Plavix for lifetime. Same day post PCI discharge today.   _____________   History of Present Illness     Elizabeth Park is a 49 y.o. female with history of CAD s/p 4V CABG 2011, mitral regurgitation s/p repair, chronic diastolic CHF, DM, HTN and hyperlipidemia who is here today for cardiac follow up. Cardiac cath  In December 2010 showed severe proximal LAD stenosis, severe mid intermediate disease with occluded mid RCA and anomalous Circumflex with severe disease. She also had severe mitral regurgitation. She underwent a 4V CABG  (LIMA to LAD, SVG to subbranch of ramus intermediate, SVG to lateral subbranch of ramus intermediate, SVG to distal RCA) and mitral valve repair January 2011. Echo September 2016 with normal LV systolic function, stable mitral valve repair. She had  chest pain in June 2018 and her nuclear stress test showed ischemia. Cardiac cath on 09/22/16 showed progression of her CAD. LV function was normal. I placed a drug eluting stent in the vein graft to the PDA, a drug eluting stent in the native Ramus intermediate branch and a drug eluting stent in the protected left main. Repeat cardiac cath 02/07/17 and found to have continued  occlusion of all three native vessels with patent LIMA to LAD, occluded SVG to ramus x 2, patent SVG to PDA but severe restenosis in the ramus intermediate stent. This was treated with another drug eluting stent. Her BP was soft in the hospital so Norvasc was stopped.  Coreg dose lowered at office visit in February 2019 due to fatigue. Echo August 2020 with LVEF=60-65%, mitral valve repair intact with mild stenosis. She had chest pain in April 2021. Nuclear stress test April 2021  without ischemia. Imdur was added and her chest pain resolved.   She is here today for follow up. She has had acceleration of dyspnea on exertion with associated chest pain, similar to prior angina. No palpitations, lower extremity edema, orthopnea, PND, dizziness, near syncope or syncope.    Hospital Course     Consultants: N/A   Her symptom was concerning for unstable angina, Dr. Angelena Form recommended proceeding with cardiac catheterization for definitive evaluation.  She presented for a scheduled procedure on 01/30/2020, previously placed DES to left main and ramus were patent, 100% proximal RCA occlusion, 100% distal RCA occlusion, 2 out of 4 grafts were patent, there was a 99% RPAV lesion that was treated with a 2.5 x 12 mm resolute Onyx DES, a separate drug-eluting stent was placed in D1 to fix a 99% blockage.  Postprocedure, she had some bleeding when TR band was loosened, this was eventually stopped.  Patient was examined prior to discharge and felt stable to follow-up as outpatient.  Post-cath instruction has been given to the patient.   Did the patient have an acute coronary syndrome (MI, NSTEMI, STEMI, etc) this admission?:  No                               Did the patient have a percutaneous coronary intervention (stent / angioplasty)?:  Yes.     Cath/PCI Registry Performance & Quality Measures: 1. Aspirin prescribed? - Yes 2. ADP Receptor Inhibitor (Plavix/Clopidogrel, Brilinta/Ticagrelor or Effient/Prasugrel)  prescribed (includes medically managed patients)? - Yes 3. High Intensity Statin (Lipitor 40-54m or Crestor 20-428m prescribed? - Yes 4. For EF <40%, was ACEI/ARB prescribed? - Not Applicable (EF >/= 4003%5. For EF <40%, Aldosterone Antagonist (Spironolactone or Eplerenone) prescribed? - Not Applicable (EF >/= 4047%6. Cardiac Rehab Phase II ordered? - Yes   _____________  Discharge Vitals Blood pressure 111/63, pulse (!) 58, temperature 98.1 F (36.7 C), height 5' 4"  (1.626 m), weight 95.3 kg, SpO2 97 %.  Filed Weights   01/30/20 0532  Weight: 95.3 kg    Labs & Radiologic Studies    CBC No results for input(s): WBC, NEUTROABS, HGB, HCT, MCV, PLT in the last 72 hours. Basic Metabolic Panel No results for input(s): NA, K, CL, CO2, GLUCOSE, BUN, CREATININE, CALCIUM, MG, PHOS in the last 72 hours. Liver Function Tests No results for input(s): AST, ALT, ALKPHOS, BILITOT, PROT, ALBUMIN in the last 72 hours. No results for input(s): LIPASE, AMYLASE in the last 72 hours. High Sensitivity Troponin:   No results for input(s):  TROPONINIHS in the last 720 hours.  BNP Invalid input(s): POCBNP D-Dimer No results for input(s): DDIMER in the last 72 hours. Hemoglobin A1C No results for input(s): HGBA1C in the last 72 hours. Fasting Lipid Panel No results for input(s): CHOL, HDL, LDLCALC, TRIG, CHOLHDL, LDLDIRECT in the last 72 hours. Thyroid Function Tests No results for input(s): TSH, T4TOTAL, T3FREE, THYROIDAB in the last 72 hours.  Invalid input(s): FREET3 _____________  CARDIAC CATHETERIZATION  Result Date: 01/30/2020  Previously placed LM drug eluting stent is widely patent.  Balloon angioplasty was performed.  Previously placed Ost Ramus to Ramus drug eluting stent is widely patent.  Balloon angioplasty was performed.  Lat Ramus lesion is 100% stenosed.  Prox RCA lesion is 100% stenosed.  Dist RCA lesion is 100% stenosed.  SVG and is normal in caliber.  Previously placed  Origin to Prox Graft drug eluting stent is widely patent.  SVG.  Mid Graft lesion is 100% stenosed.  SVG.  Origin lesion is 100% stenosed.  LIMA and is normal in caliber.  RPAV lesion is 99% stenosed.  A drug-eluting stent was successfully placed using a STENT RESOLUTE ONYX 2.5X12.  Post intervention, there is a 0% residual stenosis.  1st Diag lesion is 99% stenosed.  Previously placed Dist LM to Prox LAD stent (unknown type) is widely patent.  SVG graft was not injected.  Origin to Prox Graft lesion is 100% stenosed.  A drug-eluting stent was successfully placed using a SYNERGY XD 2.50X12.  Post intervention, there is a 0% residual stenosis.  1. Severe triple vessel CAD s/p 4/4 CABG with 2/4 patent grafts 2. 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 3. The RCA is a large dominant vessel with chronic proximal total occlusion. The vein graft to the distal RCA is patent with a patent stent in the proximal body of the graft. Severe stenosis native RCA in the posterolateral branch filling via the vein graft. 4. Successful PTCA/DES x 1 posterolateral artery through the vein graft. 5. The intermediate is a large branch with a long proximal stented segment. The distal end of the stented segment has severe restenosis. 6. Successful PTCA/DES x 1 mid intermediate branch. 7.  The origin of the left vessel is essentially a long segment that gives rise to the LAD proper and intermediate branch. The proximal segment of this vessel has a patent stented segment. The LAD is protected by the LIMA graft. Recommendations: Continue current therapy. Continue DAPT with ASA and Plavix for lifetime. Same day post PCI discharge today.   MM 3D SCREEN BREAST BILATERAL  Result Date: 01/08/2020 CLINICAL DATA:  Screening. EXAM: DIGITAL SCREENING BILATERAL MAMMOGRAM WITH TOMO AND CAD COMPARISON:  Previous exam(s). ACR Breast Density Category a: The breast tissue is almost  entirely fatty. FINDINGS: There are no findings suspicious for malignancy. Images were processed with CAD. IMPRESSION: No mammographic evidence of malignancy. A result letter of this screening mammogram will be mailed directly to the patient. RECOMMENDATION: Screening mammogram in one year. (Code:SM-B-01Y) BI-RADS CATEGORY  1: Negative. Electronically Signed   By: Everlean Alstrom M.D.   On: 01/08/2020 12:57   Disposition   Pt is being discharged home today in good condition.  Follow-up Plans & Appointments     Follow-up Information    [place], Crista Luria, [place] Follow up on 02/12/2020.   Specialty: Cardiology Why: @3 :15PM. Contact information: [address] [place] [address] [phone]  Discharge Instructions    Amb Referral to Cardiac Rehabilitation   Complete by: As directed    Diagnosis: Coronary Stents   After initial evaluation and assessments completed: Virtual Based Care may be provided alone or in conjunction with Phase 2 Cardiac Rehab based on patient barriers.: Yes   Diet - low sodium heart healthy   Complete by: As directed    Increase activity slowly   Complete by: As directed       Discharge Medications   Allergies as of 01/30/2020      Reactions   Metformin Diarrhea, Nausea And Vomiting   Immediate release tablets (not metformin ER, pt currently taking)      Medication List    TAKE these medications   aspirin 81 MG tablet Take 81 mg by mouth daily.   carvedilol 3.125 MG tablet Commonly known as: COREG TAKE 1 TABLET BY MOUTH 2 TIMES DAILY. What changed: when to take this   clopidogrel 75 MG tablet Commonly known as: PLAVIX TAKE 1 TABLET (75 MG TOTAL) BY MOUTH DAILY WITH BREAKFAST.   ergocalciferol 1.25 MG (50000 UT) capsule Commonly known as: VITAMIN D2 Take 50,000 Units by mouth once a week. Sunday   gabapentin 300 MG capsule Commonly known as: NEURONTIN Take 300 mg by mouth 3 (three) times daily.   isosorbide  mononitrate 30 MG 24 hr tablet Commonly known as: IMDUR TAKE 0.5 TABLETS (15 MG TOTAL) BY MOUTH DAILY.   metFORMIN 500 MG 24 hr tablet Commonly known as: GLUCOPHAGE-XR Take 500 mg by mouth in the morning and at bedtime.   Narcan 4 MG/0.1ML Liqd nasal spray kit Generic drug: naloxone Place 0.4 mg into the nose once.   nitroGLYCERIN 0.4 MG SL tablet Commonly known as: NITROSTAT PLACE 1 TABLET UNDER THE TONGUE EVERY 5 MINUTES AS NEEDED FOR CHEST PAIN (UP TO 3 DOSES/15MIN) What changed: See the new instructions.   NovoLOG FlexPen 100 UNIT/ML injection Generic drug: insulin aspart Inject 8-12 Units into the skin See admin instructions. Per sliding scale Under 150 no units 150-199 take 8 units 200-249 take 10 units 250-299 take 12 units   ondansetron 4 MG tablet Commonly known as: ZOFRAN Take 4 mg by mouth every 8 (eight) hours as needed for nausea or vomiting.   OneTouch Delica Plus [id number] Misc USE TO CHECK BLOOD SUGARS 3 TIMES A DAY (NEED PART B CARD)   OneTouch Verio test strip Generic drug: glucose blood USE TO CHECK BLOOD SUGAR 3 TIMES A DAY (NEED PART B CARD)   oxyCODONE-acetaminophen 10-325 MG tablet Commonly known as: PERCOCET Take 1 tablet by mouth 5 (five) times daily.   pantoprazole 40 MG tablet Commonly known as: PROTONIX Take 1 tablet (40 mg total) by mouth daily.   rosuvastatin 40 MG tablet Commonly known as: CRESTOR TAKE 1 TABLET (40 MG TOTAL) EVERY EVENING BY MOUTH. What changed: See the new instructions.   Semaglutide (1 MG/DOSE) 2 MG/1.5ML Sopn Inject 1 mg into the skin every Sunday. "Ozempic"   Tresiba FlexTouch 100 UNIT/ML FlexTouch Pen Generic drug: insulin degludec Inject 60 Units as directed at bedtime.   zolpidem 5 MG tablet Commonly known as: AMBIEN TAKE 1 TABLET BY MOUTH EVERY DAY AT BEDTIME AS NEEDED FOR SLEEP What changed:   how much to take  how to take this  when to take this  additional instructions            Outstanding Labs/Studies   None  Duration of Discharge Encounter   Greater  than 30 minutes including physician time.  Hilbert Corrigan, [place] 01/30/2020, 2:37 PM

## 2020-01-30 NOTE — Interval H&P Note (Signed)
History and Physical Interval Note:  01/30/2020 7:19 AM  Elizabeth Park  has presented today for surgery, with the diagnosis of cad - angina.  The various methods of treatment have been discussed with the patient and family. After consideration of risks, benefits and other options for treatment, the patient has consented to  Procedure(s): LEFT HEART CATH AND CORS/GRAFTS ANGIOGRAPHY (N/A) as a surgical intervention.  The patient's history has been reviewed, patient examined, no change in status, stable for surgery.  I have reviewed the patient's chart and labs.  Questions were answered to the patient's satisfaction.    Cath Lab Visit (complete for each Cath Lab visit)  Clinical Evaluation Leading to the Procedure:   ACS: No.  Non-ACS:    Anginal Classification: CCS III  Anti-ischemic medical therapy: Maximal Therapy (2 or more classes of medications)  Non-Invasive Test Results: No non-invasive testing performed  Prior CABG: Previous CABG        Lauree Chandler

## 2020-01-30 NOTE — Discharge Instructions (Signed)
Hold metformin for 48 hours post cath  DRINK PLENTY OF FLUIDS FOR THE NEXT 2-3 DAYS.  KEEP ARM ELEVATED THE REMAINDER OF THE DAY.   Radial Site Care  This sheet gives you information about how to care for yourself after your procedure. Your health care provider may also give you more specific instructions. If you have problems or questions, contact your health care provider. What can I expect after the procedure? After the procedure, it is common to have:  Bruising and tenderness at the catheter insertion area. Follow these instructions at home: Medicines  Take over-the-counter and prescription medicines only as told by your health care provider. Insertion site care 1. Follow instructions from your health care provider about how to take care of your insertion site. Make sure you: ? Wash your hands with soap and water before you change your bandage (dressing). If soap and water are not available, use hand sanitizer. ? Change your dressing as told by your health care provider. 2. Check your insertion site every day for signs of infection. Check for: ? Redness, swelling, or pain. ? Fluid or blood. ? Pus or a bad smell. ? Warmth. 3. Do not take baths, swim, or use a hot tub for 5 days. 4. You may shower 24-48 hours after the procedure. ? Remove the dressing and gently wash the site with plain soap and water. ? Pat the area dry with a clean towel. ? Do not rub the site. That could cause bleeding. 5. Do not apply powder or lotion to the site. Activity  1. For 24 hours after the procedure, or as directed by your health care provider: ? Do not flex or bend the affected arm. ? Do not push or pull heavy objects with the affected arm. ? Do not drive yourself home from the hospital or clinic. You may drive 24 hours after the procedure. ? Do not operate machinery or power tools. 2. Do not push, pull or lift anything that is heavier than 10 lb for 5 days. 3. Ask your health care provider  when it is okay to: ? Return to work or school. ? Resume usual physical activities or sports. ? Resume sexual activity. General instructions  If the catheter site starts to bleed, raise your arm and put firm pressure on the site. If the bleeding does not stop, get help right away. This is a medical emergency.  If you went home on the same day as your procedure, a responsible adult should be with you for the first 24 hours after you arrive home.  Keep all follow-up visits as told by your health care provider. This is important. Contact a health care provider if:  You have a fever.  You have redness, swelling, or yellow drainage around your insertion site. Get help right away if:  You have unusual pain at the radial site.  The catheter insertion area swells very fast.  The insertion area is bleeding, and the bleeding does not stop when you hold steady pressure on the area.  Your arm or hand becomes pale, cool, tingly, or numb. These symptoms may represent a serious problem that is an emergency. Do not wait to see if the symptoms will go away. Get medical help right away. Call your local emergency services (911 in the [place] Do not drive yourself to the hospital. Summary  After the procedure, it is common to have bruising and tenderness at the site.  Follow instructions from your health care provider about how  to take care of your radial site wound. Check the wound every day for signs of infection.  Do not push, pull or lift anything that is heavier than 10 lb for 5 days.  This information is not intended to replace advice given to you by your health care provider. Make sure you discuss any questions you have with your health care provider. Document Revised: 04/25/2017 Document Reviewed: 04/25/2017 Elsevier Patient Education  2020 [employer].

## 2020-01-30 NOTE — Progress Notes (Signed)
Attempted to remove 3cc of air at 11:15. Radial site began to bleed & air returned to TRB.

## 2020-01-30 NOTE — Progress Notes (Signed)
[id number] Education completed with pt who voiced understanding. Stressed importance of plavix with stent. Reviewed NTG use, walking for exercise, heart healthy and low carb food choices, and CRP 2. Referring to [facility] program. Pt has attended here before. Graylon Good RN BSN 01/30/2020 10:59 AM

## 2020-02-02 ENCOUNTER — Encounter (HOSPITAL_COMMUNITY): Payer: Self-pay | Admitting: Cardiovascular Disease

## 2020-02-06 ENCOUNTER — Telehealth (HOSPITAL_COMMUNITY): Payer: Self-pay

## 2020-02-06 DIAGNOSIS — G894 Chronic pain syndrome: Secondary | ICD-10-CM | POA: Diagnosis not present

## 2020-02-06 DIAGNOSIS — M79661 Pain in right lower leg: Secondary | ICD-10-CM | POA: Diagnosis not present

## 2020-02-06 DIAGNOSIS — M79662 Pain in left lower leg: Secondary | ICD-10-CM | POA: Diagnosis not present

## 2020-02-06 DIAGNOSIS — M545 Low back pain, unspecified: Secondary | ICD-10-CM | POA: Diagnosis not present

## 2020-02-06 NOTE — Telephone Encounter (Signed)
Pt insurance is active and benefits verified through Medicare a/b Co-pay 0, DED 0/0 met, out of pocket 0/0 met, co-insurance 0%. no pre-authorization required. Passport, 02/06/2020@12 :14pm, REF# [id number]  Pt insurance is active through Medicaid. Ref# [id number]  Will fax over [facility] Reimbursement form to Dr. Angelena Form   Will contact patient to see if she is interested in the Cardiac Rehab Program. If interested, patient will need to complete follow up appt. Once completed, patient will be contacted for scheduling upon review by the RN Navigator.

## 2020-02-09 DIAGNOSIS — R0600 Dyspnea, unspecified: Secondary | ICD-10-CM | POA: Diagnosis not present

## 2020-02-09 DIAGNOSIS — I10 Essential (primary) hypertension: Secondary | ICD-10-CM | POA: Diagnosis not present

## 2020-02-09 DIAGNOSIS — E1165 Type 2 diabetes mellitus with hyperglycemia: Secondary | ICD-10-CM | POA: Diagnosis not present

## 2020-02-09 DIAGNOSIS — I2510 Atherosclerotic heart disease of native coronary artery without angina pectoris: Secondary | ICD-10-CM | POA: Diagnosis not present

## 2020-02-09 DIAGNOSIS — M546 Pain in thoracic spine: Secondary | ICD-10-CM | POA: Diagnosis not present

## 2020-02-11 NOTE — Progress Notes (Signed)
Cardiology Office Note  Date:  02/11/2020   ID:  NATORIA ARCHIBALD, DOB May 21, 1970, MRN [id number]  PCP:  Secundino Ginger, PA-C  Cardiologist:  Dr. Angelena Form  _____________  Post PCI follow-up  _____________   History of Present Illness: Elizabeth Park is a 49 y.o. female with pmh CAD s/p 4V CABG 2011, MR s/p repair, chronic diastolic CHF, DM, HTN, and HLD. Cardiac cath in December 2010 showed severe proximal LAD stenosis, severe mid intermediate disease wit occluded mid RCA and andomalous circumflex with severe disase. Also has severe MR. She underwent 4V CABG (LIMA to LAD, SVG to subranch of ramus intermediate, SVG to lateal subbranch of ramus intermediate, SVG to distal RCA) and mitral valve repar January 2011. Echo in 2016 showed normal LV function, stable mitral valve repair. She had chest pain in June 2018 and her nuclear stress test showed ischemia. Cardiac cath 22/2018 showed preogression of her CAD. LV function was normal. She was treated with DES to vein graft to the PDA, DES to native Ramus intermediate branch and a DES to the left main. Repeat cath 02/07/2017 showed continued occlusion of all 3 native vessels with patent LIMA to LAD, occluded SVG to ramus x 2, patent SVG to PDA but severe restenosis in the ramus intermedius stent. This was treated with another DES. Her BP was soft and norvasc was stopped. Coreg was lowered due to fatigue. Echo in August 2020 showed LVEF 60-65%, MV repair intact with mild stenosis, She had chest pain in April 2021. Nuclear stress test April 2021 without ischemia and Imdur was added. She was seen 01/26/20 reporting accelration of dyspnea on exertion.. The patient was set up for cardiac catheterization which showed patent stent to left main and ramus, 100% RCA occlusion, 2/4 patent grafts, 99% RPAV lesion treated with DES and DES was placed in D1 to fix 99% blockage. Patient was discharged the same day.   Today, she reports she is overall doing much better. Energy  is much better than before. She is walking up and down her driveway and has no chest pain or shortness of breath with that. Plans on doing cardiac rehab. Cath site (left wrist) still has some mild tenderness but overall appears stable and is healing normally. She had a sleep study and was not diagnosed with sleep apnea. She is eating healthy and trying to branch out and find more healthy options. She follows with endocrinology for diabetes. She denies symptoms of palpitations, chest pain, shortness of breath, orthopnea, PND, lower extremity edema, claudication, dizziness, presyncope, syncope, bleeding, or neurologic sequela. The patient is tolerating medications without difficulties and is otherwise without complaint today.   _____________   Past Medical History:  Diagnosis Date  . Chronic diastolic CHF (congestive heart failure) ([place])   . Coronary artery disease    a. 4v CABG  (LIMA to LAD, SVG to subbranch of ramus intermediate, SVG to lateral subbranch of ramus intermediate, SVG to distal RCA) and mitral valve repair January 2011. b. Abnormal nuc 09/2016 s/p  drug eluting stent to the vein graft to the PDA, a drug eluting stent in the native Ramus intermediate branch and a drug eluting stent in the protected left main. c. DES to ramus intermediate 02/2017.   . Diabetes mellitus   . H/O emotional problems   . Headache, migraine   . High cholesterol   . History of blood transfusion   . History of nuclear stress test    Myoview 07/2019: EF 60,  normal perfusion, low risk.  Marland Kitchen Hypertension   . MI, old   . MRSA (methicillin resistant Staphylococcus aureus)   . MVP (mitral valve prolapse)   . Neuromuscular disorder ([place])   . Neuropathy   . S/P mitral valve repair   . Sebaceous cyst    Past Surgical History:  Procedure Laterality Date  . ABDOMINAL HYSTERECTOMY    . CARDIAC CATHETERIZATION  09/22/2016  . CORONARY ARTERY BYPASS GRAFT  with valve repair  . CORONARY STENT INTERVENTION N/A 09/22/2016    Procedure: Coronary Stent Intervention;  Surgeon: Burnell Blanks, MD;  Location: [facility];  Service: Cardiovascular;  Laterality: N/A;  . CORONARY STENT INTERVENTION N/A 02/07/2017   Procedure: CORONARY STENT INTERVENTION;  Surgeon: Martinique, Peter M, MD;  Location: [facility];  Service: Cardiovascular;  Laterality: N/A;  . CORONARY STENT INTERVENTION N/A 01/30/2020   Procedure: CORONARY STENT INTERVENTION;  Surgeon: Burnell Blanks, MD;  Location: [facility];  Service: Cardiovascular;  Laterality: N/A;  . CORONARY STENT PLACEMENT  09/22/2016  . LEFT HEART CATH AND CORS/GRAFTS ANGIOGRAPHY N/A 09/22/2016   Procedure: Left Heart Cath and Cors/Grafts Angiography;  Surgeon: Burnell Blanks, MD;  Location: [facility];  Service: Cardiovascular;  Laterality: N/A;  . LEFT HEART CATH AND CORS/GRAFTS ANGIOGRAPHY N/A 02/07/2017   Procedure: LEFT HEART CATH AND CORS/GRAFTS ANGIOGRAPHY;  Surgeon: Martinique, Peter M, MD;  Location: [facility];  Service: Cardiovascular;  Laterality: N/A;  . LEFT HEART CATH AND CORS/GRAFTS ANGIOGRAPHY N/A 01/30/2020   Procedure: LEFT HEART CATH AND CORS/GRAFTS ANGIOGRAPHY;  Surgeon: Burnell Blanks, MD;  Location: [facility];  Service: Cardiovascular;  Laterality: N/A;  . SKIN GRAFT     _____________  Current Outpatient Medications  Medication Sig Dispense Refill  . aspirin 81 MG tablet Take 81 mg by mouth daily.     . carvedilol (COREG) 3.125 MG tablet TAKE 1 TABLET BY MOUTH 2 TIMES DAILY. (Patient taking differently: Take 3.125 mg by mouth 2 (two) times daily with a meal. ) 180 tablet 1  . clopidogrel (PLAVIX) 75 MG tablet TAKE 1 TABLET (75 MG TOTAL) BY MOUTH DAILY WITH BREAKFAST. 90 tablet 3  . ergocalciferol (VITAMIN D2) 1.25 MG (50000 UT) capsule Take 50,000 Units by mouth once a week. Sunday    . gabapentin (NEURONTIN) 300 MG capsule Take 300 mg by mouth 3 (three) times daily.     . insulin  aspart (NOVOLOG FLEXPEN) 100 UNIT/ML injection Inject 8-12 Units into the skin See admin instructions. Per sliding scale Under 150 no units 150-199 take 8 units 200-249 take 10 units 250-299 take 12 units    . isosorbide mononitrate (IMDUR) 30 MG 24 hr tablet TAKE 0.5 TABLETS (15 MG TOTAL) BY MOUTH DAILY. 45 tablet 1  . Lancets (ONETOUCH DELICA PLUS [id number]) MISC USE TO CHECK BLOOD SUGARS 3 TIMES A DAY (NEED PART B CARD)    . metFORMIN (GLUCOPHAGE-XR) 500 MG 24 hr tablet Take 500 mg by mouth in the morning and at bedtime.     Marland Kitchen NARCAN 4 MG/0.1ML LIQD nasal spray kit Place 0.4 mg into the nose once.     . nitroGLYCERIN (NITROSTAT) 0.4 MG SL tablet PLACE 1 TABLET UNDER THE TONGUE EVERY 5 MINUTES AS NEEDED FOR CHEST PAIN (UP TO 3 DOSES/15MIN) (Patient taking differently: Place 0.4 mg under the tongue every 5 (five) minutes as needed for chest pain. ) 75 tablet 2  . ondansetron (ZOFRAN)  4 MG tablet Take 4 mg by mouth every 8 (eight) hours as needed for nausea or vomiting.     Glory Rosebush VERIO test strip USE TO CHECK BLOOD SUGAR 3 TIMES A DAY (NEED PART B CARD)    . oxyCODONE-acetaminophen (PERCOCET) 10-325 MG per tablet Take 1 tablet by mouth 5 (five) times daily.     . pantoprazole (PROTONIX) 40 MG tablet Take 1 tablet (40 mg total) by mouth daily. 30 tablet 11  . rosuvastatin (CRESTOR) 40 MG tablet TAKE 1 TABLET (40 MG TOTAL) EVERY EVENING BY MOUTH. (Patient taking differently: Take 40 mg by mouth at bedtime. ) 90 tablet 3  . Semaglutide 1 MG/DOSE SOPN Inject 1 mg into the skin every Sunday. "Ozempic"    . TRESIBA FLEXTOUCH 100 UNIT/ML SOPN FlexTouch Pen Inject 60 Units as directed at bedtime.   4  . zolpidem (AMBIEN) 5 MG tablet TAKE 1 TABLET BY MOUTH EVERY DAY AT BEDTIME AS NEEDED FOR SLEEP (Patient taking differently: Take 5 mg by mouth at bedtime. TAKE 1 TABLET BY MOUTH EVERY DAY AT BEDTIME AS NEEDED FOR SLEE) 30 tablet 5   No current facility-administered medications for this visit.    _____________   Allergies:   Metformin  _____________   Social History:  The patient  reports that she quit smoking about 10 years ago. Her smoking use included cigarettes. She has a 28.00 pack-year smoking history. She has never used smokeless tobacco. She reports that she does not drink alcohol and does not use drugs.  _____________   Family History:  The patient's family history includes Arthritis in her father; Asthma in her father; COPD in her father; Diabetes in her brother, father, and mother; Emphysema in her maternal grandmother; Heart disease in her father and mother; Hypertension in her brother, father, and mother; Migraines in her mother; Stroke in her mother.  _____________   ROS:  Please see the history of present illness.   All other systems are reviewed and negative.  _____________   PHYSICAL EXAM: VS:  There were no vitals taken for this visit. , BMI There is no height or weight on file to calculate BMI. GEN: Well nourished, well developed, in no acute distress  HEENT: normal  Neck: no JVD, carotid bruits, or masses Cardiac: RRR; no murmurs, rubs, or gallops. No clubbing, cyanosis, edema.  Radials/DP/PT 2+ and equal bilaterally.  Respiratory:  clear to auscultation bilaterally, normal work of breathing GI: soft, nontender, nondistended, + BS MS: no deformity or atrophy  Skin: warm and dry, no rash Neuro:  Strength and sensation are intact Psych: euthymic mood, full affect _____________  EKG:   The ekg ordered today shows NSR, 65bpm, TWI aVL, V1 and V2. , LAD  Recent Labs: 01/26/2020: ALT 18; BUN 15; Creatinine, Ser 1.14; Hemoglobin 14.7; Platelets 297; Potassium 4.7; Sodium 136  01/26/2020: Chol/HDL Ratio 3.9; Cholesterol, Total 136; HDL 35; LDL Chol Calc (NIH) 68; Triglycerides 200  Estimated Creatinine Clearance: 65.3 mL/min (A) (by C-G formula based on SCr of 1.14 mg/dL (H)).  Wt Readings from Last 3 Encounters:  01/30/20 210 lb (95.3 kg)  01/26/20 209 lb  12.8 oz (95.2 kg)  11/12/19 208 lb (94.3 kg)    Relevant studies: Echo 11/2018 1. The left ventricle has normal systolic function with an ejection  fraction of 60-65%. The cavity size was normal. Left ventricular diastolic  Doppler parameters are indeterminate. There is abnormal septal motion  consistent with post-operative status.  2. The right ventricle  has normal systolic function. The cavity was  normal. There is no increase in right ventricular wall thickness. Right  ventricular systolic pressure TR inadequate for RVSP assessment.  3. A Annuloplasty ring present valve is present in the mitral position.  Procedure Date: 2011.  4. Mild mitral valve stenosis.  5. S/p mitral valve annuloplasty. Mean gradient across valve ~4.3 mmHG @  HR 65 bpm. Gradients similar to prior study.  6. The tricuspid valve is grossly normal.  7. The aortic valve is tricuspid. No stenosis of the aortic valve.  8. The aorta is normal unless otherwise noted.  9. The aortic root and ascending aorta are normal in size and structure.  10. When compared to the prior study: No significant change from prior  study (12/04/2014).   Cardiac cath 01/2020  Previously placed LM drug eluting stent is widely patent.  Balloon angioplasty was performed.  Previously placed Ost Ramus to Ramus drug eluting stent is widely patent.  Balloon angioplasty was performed.  Lat Ramus lesion is 100% stenosed.  Prox RCA lesion is 100% stenosed.  Dist RCA lesion is 100% stenosed.  SVG and is normal in caliber.  Previously placed Origin to Prox Graft drug eluting stent is widely patent.  SVG.  Mid Graft lesion is 100% stenosed.  SVG.  Origin lesion is 100% stenosed.  LIMA and is normal in caliber.  RPAV lesion is 99% stenosed.  A drug-eluting stent was successfully placed using a STENT RESOLUTE ONYX 2.5X12.  Post intervention, there is a 0% residual stenosis.  1st Diag lesion is 99%  stenosed.  Previously placed Dist LM to Prox LAD stent (unknown type) is widely patent.  SVG graft was not injected.  Origin to Prox Graft lesion is 100% stenosed.  A drug-eluting stent was successfully placed using a SYNERGY XD 2.50X12.  Post intervention, there is a 0% residual stenosis.   1. Severe triple vessel CAD s/p 4/4 CABG with 2/4 patent grafts 2. 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 3. The RCA is a large dominant vessel with chronic proximal total occlusion. The vein graft to the distal RCA is patent with a patent stent in the proximal body of the graft. Severe stenosis native RCA in the posterolateral branch filling via the vein graft.  4. Successful PTCA/DES x 1 posterolateral artery through the vein graft.  5. The intermediate is a large branch with a long proximal stented segment. The distal end of the stented segment has severe restenosis.  6. Successful PTCA/DES x 1 mid intermediate branch.  7.  The origin of the left vessel is essentially a long segment that gives rise to the LAD proper and intermediate branch. The proximal segment of this vessel has a patent stented segment. The LAD is protected by the LIMA graft.   Recommendations: Continue current therapy. Continue DAPT with ASA and Plavix for lifetime. Same day post PCI discharge today.   Coronary Diagrams  Diagnostic Dominance: Right  Intervention    _____________   ASSESSMENT AND PLAN:  CAD s/p CABG x 4 in 2011 and recent stenting Cardiac catheterization showed patent stent to left main and ramus, 100% RCA occlusion, 2/4 patent grafts, 99% RPAV lesion treated with DES and DES was placed in D1 to fix 99% blockage. Post-procedure the patient has been doing much better reporting increased energy levels. She has been walking up and down her driveway daily with no chest pain or sob. Reports diet is generally healthy.Taking  DAPT with Aspirin and Plavix.  Plan to continue this for lifetime. Denies bleeding issues. She has not needed SL Nitro. Plans on doing cardiac rehab. Cath site, left wrist, still with mild tenderness. Has very mild bruising on exam but otherwise unremarkable. Continue BB Imdur.   MV s/p repair Echo in 2020 showed preserved EF and stable Mitral valve  Chronic diastolic CHF Weights are stable. Euvolemic on exam  HTN BP today 112/70. Continue Imdur 15 mg daily and coreg 3.161m BID  HLD LDL 68 01/2020. Continue statin   Disposition:   FU with MD in 6 months   Signed, Averey Koning HNinfa Meeker PA-C 02/11/2020 10:12 PM    _____________ [facility] [address] [phone](office) ([phone](fax)

## 2020-02-12 ENCOUNTER — Encounter: Payer: Self-pay | Admitting: Medical

## 2020-02-12 ENCOUNTER — Ambulatory Visit (INDEPENDENT_AMBULATORY_CARE_PROVIDER_SITE_OTHER): Payer: Medicare Other | Admitting: Medical

## 2020-02-12 ENCOUNTER — Other Ambulatory Visit: Payer: Self-pay

## 2020-02-12 VITALS — BP 112/70 | HR 65 | Ht 64.0 in | Wt 208.8 lb

## 2020-02-12 DIAGNOSIS — E7800 Pure hypercholesterolemia, unspecified: Secondary | ICD-10-CM

## 2020-02-12 DIAGNOSIS — Z9889 Other specified postprocedural states: Secondary | ICD-10-CM

## 2020-02-12 DIAGNOSIS — I25810 Atherosclerosis of coronary artery bypass graft(s) without angina pectoris: Secondary | ICD-10-CM

## 2020-02-12 DIAGNOSIS — I5032 Chronic diastolic (congestive) heart failure: Secondary | ICD-10-CM

## 2020-02-12 DIAGNOSIS — I10 Essential (primary) hypertension: Secondary | ICD-10-CM

## 2020-02-12 NOTE — Patient Instructions (Signed)
Medication Instructions:  Your physician recommends that you continue on your current medications as directed. Please refer to the Current Medication list given to you today.  *If you need a refill on your cardiac medications before your next appointment, please call your pharmacy*   Lab Work: None ordered  If you have labs (blood work) drawn today and your tests are completely normal, you will receive your results only by: MyChart Message (if you have MyChart) OR A paper copy in the mail If you have any lab test that is abnormal or we need to change your treatment, we will call you to review the results.   Testing/Procedures: None ordered   Follow-Up: At [facility], you and your health needs are our priority.  As part of our continuing mission to provide you with exceptional heart care, we have created designated Provider Care Teams.  These Care Teams include your primary Cardiologist (physician) and Advanced Practice Providers (APPs -  Physician Assistants and Nurse Practitioners) who all work together to provide you with the care you need, when you need it.  We recommend signing up for the patient portal called "MyChart".  Sign up information is provided on this After Visit Summary.  MyChart is used to connect with patients for Virtual Visits (Telemedicine).  Patients are able to view lab/test results, encounter notes, upcoming appointments, etc.  Non-urgent messages can be sent to your provider as well.   To learn more about what you can do with MyChart, go to [email or web address].    Your next appointment:   6 month(s)  The format for your next appointment:   In Person  Provider:   You may see Christopher McAlhany, MD or one of the following Advanced Practice Providers on your designated Care Team:   Dayna Dunn, PA-C Michele Lenze, PA-C   Other Instructions   

## 2020-03-08 DIAGNOSIS — N62 Hypertrophy of breast: Secondary | ICD-10-CM | POA: Diagnosis not present

## 2020-03-08 DIAGNOSIS — I10 Essential (primary) hypertension: Secondary | ICD-10-CM | POA: Diagnosis not present

## 2020-03-08 DIAGNOSIS — E1165 Type 2 diabetes mellitus with hyperglycemia: Secondary | ICD-10-CM | POA: Diagnosis not present

## 2020-03-08 DIAGNOSIS — R809 Proteinuria, unspecified: Secondary | ICD-10-CM | POA: Diagnosis not present

## 2020-03-08 DIAGNOSIS — E1140 Type 2 diabetes mellitus with diabetic neuropathy, unspecified: Secondary | ICD-10-CM | POA: Diagnosis not present

## 2020-03-09 DIAGNOSIS — M79662 Pain in left lower leg: Secondary | ICD-10-CM | POA: Diagnosis not present

## 2020-03-09 DIAGNOSIS — M545 Low back pain, unspecified: Secondary | ICD-10-CM | POA: Diagnosis not present

## 2020-03-09 DIAGNOSIS — M79661 Pain in right lower leg: Secondary | ICD-10-CM | POA: Diagnosis not present

## 2020-03-09 DIAGNOSIS — G894 Chronic pain syndrome: Secondary | ICD-10-CM | POA: Diagnosis not present

## 2020-03-11 ENCOUNTER — Telehealth: Payer: Self-pay | Admitting: *Deleted

## 2020-03-11 NOTE — Telephone Encounter (Signed)
   Primary Cardiologist: Lauree Chandler, MD  Chart reviewed as part of pre-operative protocol coverage. Patient recently had PCI with PTCA/DES to PLA through vein graft and PTCA/DES to mid intermediate branch on 01/30/2020. Patient will need uninterrupted dual antiplatelet therapy with Aspirin and Plavix for at least 6 months (through the end of April 2022). Therefore, breast reduction surgery will need to be postponed until after this. Please send [place] a new pre-op clearance form closer to the time of surgery so we can address pre-op risk assessment and give our recommendations for holding Aspirin/Plavix.  I will route this recommendation to the requesting party via Epic fax function and remove from pre-op pool.  Please call with questions.  Darreld Mclean, PA-C 03/11/2020, 12:59 PM

## 2020-03-11 NOTE — Telephone Encounter (Signed)
   [facility] Pre-operative Risk Assessment    HEARTCARE STAFF: - Please ensure there is not already an duplicate clearance open for this procedure. - Under Visit Info/Reason for Call, type in Other and utilize the format Clearance MM/DD/YY or Clearance TBD. Do not use dashes or single digits. - If request is for dental extraction, please clarify the # of teeth to be extracted.  Request for surgical clearance:  1. What type of surgery is being performed? B/L BREAST REDUCTION   2. When is this surgery scheduled? TBD   3. What type of clearance is required (medical clearance vs. Pharmacy clearance to hold med vs. Both)? MEDICAL  4. Are there any medications that need to be held prior to surgery and how long? HOLD PLAVIX x 6 DAYS PRIOR AND REMAIN OFF PLAVIX x 2 WEEKS POST PROCEDURE; ALSO IF PT COULD BE OFF ASA x 2 WEEKS PRIOR AND POST PROCEDURE   5. Practice name and name of physician performing surgery? [facility]; DR. DAVID BOWERS   6. What is the office phone number? [phone]   7.   What is the office fax number? [phone]  8.   Anesthesia type (None, local, MAC, general) ? GENERAL   Elizabeth Park 03/11/2020, 12:26 PM  _________________________________________________________________   (provider comments below)

## 2020-03-18 ENCOUNTER — Telehealth (HOSPITAL_COMMUNITY): Payer: Self-pay | Admitting: Student-PharmD

## 2020-03-18 NOTE — Telephone Encounter (Signed)
Cardiac Rehab Medication Review by a Pharmacist  Does the patient  feel that his/her medications are working for him/her?  yes  Has the patient been experiencing any side effects to the medications prescribed?  no  Does the patient measure his/her own blood pressure or blood glucose at home?  yes - checks blood sugars 4x/day; has a blood pressure cuff at home but does not check it regularly   Does the patient have any problems obtaining medications due to transportation or finances?   no  Understanding of regimen: good Understanding of indications: good Potential of compliance: good   Pharmacist Intervention: None needed at this time   Rebbeca Paul, PharmD PGY1 Pharmacy Resident 03/18/2020 12:15 PM

## 2020-03-24 ENCOUNTER — Telehealth (HOSPITAL_COMMUNITY): Payer: Self-pay | Admitting: *Deleted

## 2020-03-25 ENCOUNTER — Telehealth (HOSPITAL_COMMUNITY): Payer: Self-pay

## 2020-03-25 NOTE — Telephone Encounter (Signed)
Called placed to patient Elizabeth Park to confirm [facility] program appointment for 03/30/2020 @ 1:30 pm. Confirmed appointment with patient and completed health history. Completed COVID-19 screening with patient and instructed to call the C/R department prior to coming if she is exposed to a person with know or suspected COVID-19, or if develops any symptoms. Pt verbalizes understanding. Pt has been in the [facility] program before and knows where it is located. All questions answered. Pt instructed on proper clothing and shoes. Instructed to take all medications and eat before coming to appointment. Pt verbalized understanding.  Lesly Rubenstein MS, ACSM-EP-C, CCRP

## 2020-03-30 ENCOUNTER — Encounter (HOSPITAL_COMMUNITY)
Admission: RE | Admit: 2020-03-30 | Discharge: 2020-03-30 | Disposition: A | Discharge status: Home / Self Care | Payer: Medicare Other | Source: Ambulatory Visit | Attending: Cardiovascular Disease | Admitting: Cardiovascular Disease

## 2020-03-30 ENCOUNTER — Other Ambulatory Visit: Payer: Self-pay

## 2020-03-30 ENCOUNTER — Encounter (HOSPITAL_COMMUNITY): Payer: Self-pay

## 2020-03-30 VITALS — BP 132/90 | HR 74 | Resp 18 | Ht 64.5 in | Wt 211.0 lb

## 2020-03-30 DIAGNOSIS — Z79899 Other long term (current) drug therapy: Secondary | ICD-10-CM | POA: Insufficient documentation

## 2020-03-30 DIAGNOSIS — Z955 Presence of coronary angioplasty implant and graft: Secondary | ICD-10-CM | POA: Diagnosis not present

## 2020-03-30 LAB — GLUCOSE, CAPILLARY: Glucose-Capillary: 138 mg/dL — ABNORMAL HIGH (ref 70–99)

## 2020-03-30 NOTE — Progress Notes (Signed)
Cardiac Individual Treatment Plan  Patient Details  Name: Elizabeth Park MRN: [id number] Date of Birth: [birth year] Referring Provider:   Flowsheet Row CARDIAC REHAB PHASE II ORIENTATION from 03/30/2020 in [facility]  Referring Provider Delorise Royals MD      Initial Encounter Date:  [place] PHASE II ORIENTATION from 03/30/2020 in [facility]  Date 03/30/20      Visit Diagnosis: Status post coronary artery stent placement  Patient's Home Medications on Admission:  Current Outpatient Medications:  .  aspirin 81 MG tablet, Take 81 mg by mouth daily. , Disp: , Rfl:  .  carvedilol (COREG) 3.125 MG tablet, TAKE 1 TABLET BY MOUTH 2 TIMES DAILY. (Patient taking differently: Take 3.125 mg by mouth 2 (two) times daily with a meal.), Disp: 180 tablet, Rfl: 1 .  clopidogrel (PLAVIX) 75 MG tablet, TAKE 1 TABLET (75 MG TOTAL) BY MOUTH DAILY WITH BREAKFAST., Disp: 90 tablet, Rfl: 3 .  ergocalciferol (VITAMIN D2) 1.25 MG (50000 UT) capsule, Take 50,000 Units by mouth once a week. Sunday, Disp: , Rfl:  .  gabapentin (NEURONTIN) 300 MG capsule, Take 300 mg by mouth 3 (three) times daily. , Disp: , Rfl:  .  insulin aspart (NOVOLOG) 100 UNIT/ML injection, Inject 8-12 Units into the skin See admin instructions. Per sliding scale Under 150 no units 150-199 take 8 units 200-249 take 10 units 250-299 take 12 units, Disp: , Rfl:  .  isosorbide mononitrate (IMDUR) 30 MG 24 hr tablet, TAKE 0.5 TABLETS (15 MG TOTAL) BY MOUTH DAILY., Disp: 45 tablet, Rfl: 1 .  Lancets (ONETOUCH DELICA PLUS [id number]) MISC, USE TO CHECK BLOOD SUGARS 3 TIMES A DAY (NEED PART B CARD), Disp: , Rfl:  .  metFORMIN (GLUCOPHAGE-XR) 500 MG 24 hr tablet, Take 500 mg by mouth in the morning and at bedtime. , Disp: , Rfl:  .  NARCAN 4 MG/0.1ML LIQD nasal spray kit, Place 0.4 mg into the nose once. , Disp: , Rfl:  .  nitroGLYCERIN (NITROSTAT) 0.4 MG SL  tablet, PLACE 1 TABLET UNDER THE TONGUE EVERY 5 MINUTES AS NEEDED FOR CHEST PAIN (UP TO 3 DOSES/15MIN) (Patient taking differently: Place 0.4 mg under the tongue every 5 (five) minutes as needed for chest pain.), Disp: 75 tablet, Rfl: 2 .  ondansetron (ZOFRAN) 4 MG tablet, Take 4 mg by mouth every 8 (eight) hours as needed for nausea or vomiting. , Disp: , Rfl:  .  ONETOUCH VERIO test strip, USE TO CHECK BLOOD SUGAR 3 TIMES A DAY (NEED PART B CARD), Disp: , Rfl:  .  oxyCODONE-acetaminophen (PERCOCET) 10-325 MG per tablet, Take 1 tablet by mouth 5 (five) times daily. , Disp: , Rfl:  .  pantoprazole (PROTONIX) 40 MG tablet, Take 1 tablet (40 mg total) by mouth daily., Disp: 30 tablet, Rfl: 11 .  rosuvastatin (CRESTOR) 40 MG tablet, TAKE 1 TABLET (40 MG TOTAL) EVERY EVENING BY MOUTH. (Patient taking differently: Take 40 mg by mouth at bedtime.), Disp: 90 tablet, Rfl: 3 .  Semaglutide 1 MG/DOSE SOPN, Inject 1 mg into the skin every Sunday. "Ozempic", Disp: , Rfl:  .  TRESIBA FLEXTOUCH 100 UNIT/ML SOPN FlexTouch Pen, Inject 60 Units as directed at bedtime. , Disp: , Rfl: 4 .  zolpidem (AMBIEN) 5 MG tablet, TAKE 1 TABLET BY MOUTH EVERY DAY AT BEDTIME AS NEEDED FOR SLEEP (Patient taking differently: Take 5 mg by mouth at bedtime. TAKE 1 TABLET BY MOUTH  EVERY DAY AT BEDTIME AS NEEDED FOR SLEE), Disp: 30 tablet, Rfl: 5  Past Medical History: Past Medical History:  Diagnosis Date  . Chronic diastolic CHF (congestive heart failure) ([place])   . Coronary artery disease    a. 4v CABG  (LIMA to LAD, SVG to subbranch of ramus intermediate, SVG to lateral subbranch of ramus intermediate, SVG to distal RCA) and mitral valve repair January 2011. b. Abnormal nuc 09/2016 s/p  drug eluting stent to the vein graft to the PDA, a drug eluting stent in the native Ramus intermediate branch and a drug eluting stent in the protected left main. c. DES to ramus intermediate 02/2017.   . Diabetes mellitus   . H/O emotional problems    . Headache, migraine   . High cholesterol   . History of blood transfusion   . History of nuclear stress test    Myoview 07/2019: EF 60, normal perfusion, low risk.  [place] Hypertension   . MI, old   . MRSA (methicillin resistant Staphylococcus aureus)   . MVP (mitral valve prolapse)   . Neuromuscular disorder ([place])   . Neuropathy   . S/P mitral valve repair   . Sebaceous cyst     Tobacco Use: Social History   Tobacco Use  Smoking Status Former Smoker  . Packs/day: 1.00  . Years: 28.00  . Pack years: 28.00  . Types: Cigarettes  . Quit date: 03/31/2009  . Years since quitting: 11.0  Smokeless Tobacco Never Used    Labs: Recent Chemical engineer    Labs for ITP Cardiac and Pulmonary Rehab Latest Ref Rng & Units 05/28/2009 09/02/2009 04/12/2017 11/14/2018 01/26/2020   Cholestrol 100 - 199 mg/dL - - 126 148 136   LDLCALC 0 - 99 mg/dL - - 61 55 68   HDL >39 mg/dL - - 32(L) 36(L) 35(L)   Trlycerides 0 - 149 mg/dL - - 164(H) 285(H) 200(H)   Hemoglobin A1c % - 6.5 - - -   PHART 7.350 - 7.400 7.454(H) - - - -   PCO2ART 35.0 - 45.0 mmHg 33.9(L) - - - -   HCO3 20.0 - 24.0 mEq/L 23.4 - - - -   TCO2 0 - 100 mmol/L 24.5 - - - -   ACIDBASEDEF 0.0 - 2.0 mmol/L 0.0 - - - -   O2SAT % 97.9 - - - -      Capillary Blood Glucose: Lab Results  Component Value Date   GLUCAP 138 (H) 03/30/2020   GLUCAP 122 (H) 01/30/2020   GLUCAP 119 (H) 01/30/2020   GLUCAP 170 (H) 05/02/2017   GLUCAP 131 (H) 04/27/2017     Exercise Target Goals: Exercise Program Goal: Individual exercise prescription set using results from initial 6 min walk test and THRR while considering  patient's activity barriers and safety.   Exercise Prescription Goal: Initial exercise prescription builds to 30-45 minutes a day of aerobic activity, 2-3 days per week.  Home exercise guidelines will be given to patient during program as part of exercise prescription that the participant will acknowledge.  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 HR 85 bpm     Max Ex. BP 156/80     2 Minute Post BP 142/84            Oxygen Initial Assessment:   Oxygen Re-Evaluation:   Oxygen Discharge (Final Oxygen Re-Evaluation):   Initial Exercise Prescription:  Initial Exercise Prescription - 03/30/20 1500      Date of Initial Exercise RX and Referring Provider   Date 03/30/20    Referring Provider Delorise Royals MD    Expected Discharge Date 05/28/20      Treadmill   MPH 2.7    Grade 1    Minutes 15    METs 3.07      NuStep   Level 2    SPM 85    Minutes 15    METs 2.2      Prescription Details   Frequency (times per week) 3    Duration Progress to 30 minutes of continuous aerobic without signs/symptoms of physical distress      Intensity   THRR 40-80% of Max Heartrate 68-135    Ratings of Perceived Exertion 11-13    Perceived Dyspnea 0-4      Progression   Progression Continue progressive overload as per policy without signs/symptoms or physical distress.      Resistance Training   Training Prescription Yes    Weight 3lbs    Reps 10-15           Perform Capillary Blood Glucose checks as needed.  Exercise Prescription Changes:   Exercise Comments:   Exercise Goals and  Review:   Exercise Goals    Row Name 03/30/20 1501             Exercise Goals   Increase Physical Activity Yes       Intervention Provide advice, education, support and counseling about physical activity/exercise needs.;Develop an individualized exercise prescription for aerobic and resistive training based on initial evaluation findings, risk stratification, comorbidities and participant's personal goals.       Expected Outcomes Short Term: Attend rehab on a regular basis to increase amount of physical activity.;Long Term: Add in home exercise to make exercise part of routine and to increase amount of physical activity.;Long Term: Exercising regularly at least 3-5 days a week.       Increase Strength and Stamina Yes       Intervention Provide advice, education, support and counseling about physical activity/exercise needs.;Develop an individualized exercise prescription for aerobic and resistive training based on initial evaluation findings, risk stratification, comorbidities and participant's personal goals.       Expected Outcomes Short Term: Increase workloads from initial exercise prescription for resistance, speed, and METs.;Short Term: Perform resistance training exercises routinely during rehab and add in resistance training at home;Long Term: Improve cardiorespiratory fitness, muscular endurance and  strength as measured by increased METs and functional capacity (6MWT)       Able to understand and use rate of perceived exertion (RPE) scale Yes       Intervention Provide education and explanation on how to use RPE scale       Expected Outcomes Short Term: Able to use RPE daily in rehab to express subjective intensity level;Long Term:  Able to use RPE to guide intensity level when exercising independently       Knowledge and understanding of Target Heart Rate Range (THRR) Yes       Intervention Provide education and explanation of THRR including how the numbers were predicted and where they  are located for reference       Expected Outcomes Short Term: Able to state/look up THRR;Short Term: Able to use daily as guideline for intensity in rehab;Long Term: Able to use THRR to govern intensity when exercising independently       Understanding of Exercise Prescription Yes       Intervention Provide education, explanation, and written materials on patient's individual exercise prescription       Expected Outcomes Short Term: Able to explain program exercise prescription;Long Term: Able to explain home exercise prescription to exercise independently              Exercise Goals Re-Evaluation :   Discharge Exercise Prescription (Final Exercise Prescription Changes):   Nutrition:  Target Goals: Understanding of nutrition guidelines, daily intake of sodium <1548m, cholesterol <2027m calories 30% from fat and 7% or less from saturated fats, daily to have 5 or more servings of fruits and vegetables.  Biometrics:  Pre Biometrics - 03/30/20 1400      Pre Biometrics   Height 5' 4.5" (1.638 m)    Weight 95.7 kg    Waist Circumference 48 inches    Hip Circumference 47 inches    Waist to Hip Ratio 1.02 %    BMI (Calculated) 35.67    Triceps Skinfold 30 mm    % Body Fat 46.9 %    Grip Strength 37 kg    Flexibility 10.75 in    Single Leg Stand 17.25 seconds            Nutrition Therapy Plan and Nutrition Goals:   Nutrition Assessments:  MEDIFICTS Score Key:  ?70 Need to make dietary changes   40-70 Heart Healthy Diet  ? 40 Therapeutic Level Cholesterol Diet    Picture Your Plate Scores:  <4<32nhealthy dietary pattern with much room for improvement.  41-50 Dietary pattern unlikely to meet recommendations for good health and room for improvement.  51-60 More healthful dietary pattern, with some room for improvement.   >60 Healthy dietary pattern, although there may be some specific behaviors that could be improved.    Nutrition Goals  Re-Evaluation:   Nutrition Goals Re-Evaluation:   Nutrition Goals Discharge (Final Nutrition Goals Re-Evaluation):   Psychosocial: Target Goals: Acknowledge presence or absence of significant depression and/or stress, maximize coping skills, provide positive support system. Participant is able to verbalize types and ability to use techniques and skills needed for reducing stress and depression.  Initial Review & Psychosocial Screening:  Initial Psych Review & Screening - 03/30/20 1359      Initial Review   Current issues with None Identified      Family Dynamics   Good Support System? Yes    Comments Ms. Puebla presents to her cardiac rehab orientation with a positive attitude and outlook. This  is her 3rd time attending cardiac rehab and she is thankful for the opportunity. She admits to a strong support system including her fiance and 62 year old son of the home. They are making lifestyle changes with her. She loves doing crafts. States she has no stress in her life. No psychosocial barriers to self health management or participation in cardiac rehab identified. No interventions needed.      Barriers   Psychosocial barriers to participate in program There are no identifiable barriers or psychosocial needs.      Screening Interventions   Interventions Encouraged to exercise;Provide feedback about the scores to participant           Quality of Life Scores:  Quality of Life - 03/30/20 1451      Quality of Life   Select Quality of Life      Quality of Life Scores   Health/Function Pre 28.4 %    Socioeconomic Pre 30 %    Psych/Spiritual Pre 30 %    Family Pre 28.8 %    GLOBAL Pre 29.14 %          Scores of 19 and below usually indicate a poorer quality of life in these areas.  A difference of  2-3 points is a clinically meaningful difference.  A difference of 2-3 points in the total score of the Quality of Life Index has been associated with significant improvement in  overall quality of life, self-image, physical symptoms, and general health in studies assessing change in quality of life.  PHQ-9: Recent Review Flowsheet Data    Depression screen [facility] 2/9 03/30/2020 07/04/2017 07/04/2017 04/25/2017 04/10/2017   Decreased Interest 0 0 0 0 0   Down, Depressed, Hopeless 0 0 0 0 0    PHQ - 2 Score 0 0 0 0 0     Interpretation of Total Score  Total Score Depression Severity:  1-4 = Minimal depression, 5-9 = Mild depression, 10-14 = Moderate depression, 15-19 = Moderately severe depression, 20-27 = Severe depression   Psychosocial Evaluation and Intervention:   Psychosocial Re-Evaluation:   Psychosocial Discharge (Final Psychosocial Re-Evaluation):   Vocational Rehabilitation: Provide vocational rehab assistance to qualifying candidates.   Vocational Rehab Evaluation & Intervention:  Vocational Rehab - 03/30/20 1359      Initial Vocational Rehab Evaluation & Intervention   Assessment shows need for Vocational Rehabilitation No      Vocational Rehab Re-Evaulation   Comments Patient is disabled and has not worked in "years"           Education: Education Goals: Education classes will be provided on a weekly basis, covering required topics. Participant will state understanding/return demonstration of topics presented.  Learning Barriers/Preferences:  Learning Barriers/Preferences - 03/30/20 1456      Learning Barriers/Preferences   Learning Barriers Sight   wears glasses   Learning Preferences Group Instruction;Individual Instruction;Pictoral;Skilled Demonstration;Computer/Internet           Education Topics: Count Your Pulse:  -Group instruction provided by verbal instruction, demonstration, patient participation and written materials to support subject.  Instructors address importance of being able to find your pulse and how to count your pulse when at home without a heart monitor.  Patients get hands on experience counting their pulse with  staff help and individually.   Heart Attack, Angina, and Risk Factor Modification:  -Group instruction provided by verbal instruction, video, and written materials to support subject.  Instructors address signs and symptoms of angina and heart attacks.  Also discuss risk factors for heart disease and how to make changes to improve heart health risk factors.   Functional Fitness:  -Group instruction provided by verbal instruction, demonstration, patient participation, and written materials to support subject.  Instructors address safety measures for doing things around the house.  Discuss how to get up and down off the floor, how to pick things up properly, how to safely get out of a chair without assistance, and balance training.   Meditation and Mindfulness:  -Group instruction provided by verbal instruction, patient participation, and written materials to support subject.  Instructor addresses importance of mindfulness and meditation practice to help reduce stress and improve awareness.  Instructor also leads participants through a meditation exercise.    Stretching for Flexibility and Mobility:  -Group instruction provided by verbal instruction, patient participation, and written materials to support subject.  Instructors lead participants through series of stretches that are designed to increase flexibility thus improving mobility.  These stretches are additional exercise for major muscle groups that are typically performed during regular warm up and cool down.   Hands Only CPR:  -Group verbal, video, and participation provides a basic overview of AHA guidelines for community CPR. Role-play of emergencies allow participants the opportunity to practice calling for help and chest compression technique with discussion of AED use.   Hypertension: -Group verbal and written instruction that provides a basic overview of hypertension including the most recent diagnostic guidelines, risk factor  reduction with self-care instructions and medication management.    Nutrition I class: Heart Healthy Eating:  -Group instruction provided by PowerPoint slides, verbal discussion, and written materials to support subject matter. The instructor gives an explanation and review of the Therapeutic Lifestyle Changes diet recommendations, which includes a discussion on lipid goals, dietary fat, sodium, fiber, plant stanol/sterol esters, sugar, and the components of a well-balanced, healthy diet.   Nutrition II class: Lifestyle Skills:  -Group instruction provided by PowerPoint slides, verbal discussion, and written materials to support subject matter. The instructor gives an explanation and review of label reading, grocery shopping for heart health, heart healthy recipe modifications, and ways to make healthier choices when eating out.   Diabetes Question & Answer:  -Group instruction provided by PowerPoint slides, verbal discussion, and written materials to support subject matter. The instructor gives an explanation and review of diabetes co-morbidities, pre- and post-prandial blood glucose goals, pre-exercise blood glucose goals, signs, symptoms, and treatment of hypoglycemia and hyperglycemia, and foot care basics.   Diabetes Blitz:  -Group instruction provided by PowerPoint slides, verbal discussion, and written materials to support subject matter. The instructor gives an explanation and review of the physiology behind type 1 and type 2 diabetes, diabetes medications and rational behind using different medications, pre- and post-prandial blood glucose recommendations and Hemoglobin A1c goals, diabetes diet, and exercise including blood glucose guidelines for exercising safely.    Portion Distortion:  -Group instruction provided by PowerPoint slides, verbal discussion, written materials, and food models to support subject matter. The instructor gives an explanation of serving size versus portion  size, changes in portions sizes over the last 20 years, and what consists of a serving from each food group.   Stress Management:  -Group instruction provided by verbal instruction, video, and written materials to support subject matter.  Instructors review role of stress in heart disease and how to cope with stress positively.     Exercising on Your Own:  -Group instruction provided by verbal instruction, power point, and written materials  to support subject.  Instructors discuss benefits of exercise, components of exercise, frequency and intensity of exercise, and end points for exercise.  Also discuss use of nitroglycerin and activating EMS.  Review options of places to exercise outside of rehab.  Review guidelines for sex with heart disease. Flowsheet Row CARDIAC REHAB PHASE II EXERCISE from 12/06/2016 in [place]  Date 12/06/16  Educator EP  Instruction Review Code (Retired) 2- meets goals/outcomes      Cardiac Drugs I:  -Group instruction provided by verbal instruction and written materials to support subject.  Instructor reviews cardiac drug classes: antiplatelets, anticoagulants, beta blockers, and statins.  Instructor discusses reasons, side effects, and lifestyle considerations for each drug class.   Cardiac Drugs II:  -Group instruction provided by verbal instruction and written materials to support subject.  Instructor reviews cardiac drug classes: angiotensin converting enzyme inhibitors (ACE-I), angiotensin II receptor blockers (ARBs), nitrates, and calcium channel blockers.  Instructor discusses reasons, side effects, and lifestyle considerations for each drug class.   Anatomy and Physiology of the Circulatory System:  Group verbal and written instruction and models provide basic cardiac anatomy and physiology, with the coronary electrical and arterial systems. Review of: AMI, Angina, Valve disease, Heart Failure, Peripheral Artery Disease,  Cardiac Arrhythmia, Pacemakers, and the ICD. Flowsheet Row CARDIAC REHAB PHASE II EXERCISE from 12/06/2016 in [place]  Date 11/01/16  Instruction Review Code (Retired) 2- meets goals/outcomes      Other Education:  -Group or individual verbal, written, or video instructions that support the educational goals of the cardiac rehab program.   Holiday Eating Survival Tips:  -Group instruction provided by PowerPoint slides, verbal discussion, and written materials to support subject matter. The instructor gives patients tips, tricks, and techniques to help them not only survive but enjoy the holidays despite the onslaught of food that accompanies the holidays.   Knowledge Questionnaire Score:  Knowledge Questionnaire Score - 03/30/20 1452      Knowledge Questionnaire Score   Pre Score 21/24           Core Components/Risk Factors/Patient Goals at Admission:  Personal Goals and Risk Factors at Admission - 03/30/20 1452      Core Components/Risk Factors/Patient Goals on Admission    Weight Management Yes;Obesity;Weight Loss    Intervention Weight Management: Develop a combined nutrition and exercise program designed to reach desired caloric intake, while maintaining appropriate intake of nutrient and fiber, sodium and fats, and appropriate energy expenditure required for the weight goal.;Weight Management: Provide education and appropriate resources to help participant work on and attain dietary goals.;Weight Management/Obesity: Establish reasonable short term and long term weight goals.;Obesity: Provide education and appropriate resources to help participant work on and attain dietary goals.    Admit Weight 210 lb 15.7 oz (95.7 kg)    Goal Weight: Long Term 175 lb (79.4 kg)   Patient goal   Expected Outcomes Short Term: Continue to assess and modify interventions until short term weight is achieved;Long Term: Adherence to nutrition and physical  activity/exercise program aimed toward attainment of established weight goal;Weight Maintenance: Understanding of the daily nutrition guidelines, which includes 25-35% calories from fat, 7% or less cal from saturated fats, less than 252m cholesterol, less than 1.5gm of sodium, & 5 or more servings of fruits and vegetables daily;Weight Loss: Understanding of general recommendations for a balanced deficit meal plan, which promotes 1-2 lb weight loss per week and includes a negative energy balance of  [phone] kcal/d;Understanding recommendations for meals to include 15-35% energy as protein, 25-35% energy from fat, 35-60% energy from carbohydrates, less than 254m of dietary cholesterol, 20-35 gm of total fiber daily;Understanding of distribution of calorie intake throughout the day with the consumption of 4-5 meals/snacks    Diabetes Yes    Intervention Provide education about signs/symptoms and action to take for hypo/hyperglycemia.;Provide education about proper nutrition, including hydration, and aerobic/resistive exercise prescription along with prescribed medications to achieve blood glucose in normal ranges: Fasting glucose 65-99 mg/dL    Expected Outcomes Short Term: Participant verbalizes understanding of the signs/symptoms and immediate care of hyper/hypoglycemia, proper foot care and importance of medication, aerobic/resistive exercise and nutrition plan for blood glucose control.;Long Term: Attainment of HbA1C < 7%.    Hypertension Yes    Intervention Provide education on lifestyle modifcations including regular physical activity/exercise, weight management, moderate sodium restriction and increased consumption of fresh fruit, vegetables, and low fat dairy, alcohol moderation, and smoking cessation.;Monitor prescription use compliance.    Expected Outcomes Short Term: Continued assessment and intervention until BP is < 140/926mHG in hypertensive participants. < 130/8058mG in hypertensive  participants with diabetes, heart failure or chronic kidney disease.;Long Term: Maintenance of blood pressure at goal levels.    Lipids Yes    Intervention Provide education and support for participant on nutrition & aerobic/resistive exercise along with prescribed medications to achieve LDL <52m47mDL >40mg52m Expected Outcomes Short Term: Participant states understanding of desired cholesterol values and is compliant with medications prescribed. Participant is following exercise prescription and nutrition guidelines.;Long Term: Cholesterol controlled with medications as prescribed, with individualized exercise RX and with personalized nutrition plan. Value goals: LDL < 52mg,29m > 40 mg.           Core Components/Risk Factors/Patient Goals Review:    Core Components/Risk Factors/Patient Goals at Discharge (Final Review):    ITP Comments:  ITP Comments    Row Name 03/30/20 1350           ITP Comments Dr. Traci Fransico Himcal Director Cardiac Rehab [facility]        Comments: Patient attended orientation on 03/30/2020 to review rules and guidelines for program.  Completed 6 minute walk test, Intitial ITP, and exercise prescription.  VSS. Telemetry-NSR with occasional PVCs.  Asymptomatic. Safety measures and social distancing in place per CDC guidelines.

## 2020-04-05 ENCOUNTER — Encounter (HOSPITAL_COMMUNITY): Payer: Medicare HMO

## 2020-04-07 ENCOUNTER — Telehealth (HOSPITAL_COMMUNITY): Payer: Self-pay | Admitting: *Deleted

## 2020-04-07 ENCOUNTER — Encounter (HOSPITAL_COMMUNITY): Payer: Medicare HMO
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
## 2020-04-07 NOTE — Telephone Encounter (Signed)
Pt absent from cardiac rehab x 2 sessions. Message left for Braniyah regarding her absences and general well being. Requested call back. Contact number provided. Alanson Aly, BSN Cardiac and Emergency planning/management officer
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
## 2020-04-09 ENCOUNTER — Encounter (HOSPITAL_COMMUNITY)
Admission: RE | Admit: 2020-04-09 | Discharge: 2020-04-09 | Disposition: A | Discharge status: Home / Self Care | Payer: Medicare HMO | Source: Ambulatory Visit | Attending: Cardiovascular Disease | Admitting: Cardiovascular Disease

## 2020-04-09 ENCOUNTER — Other Ambulatory Visit: Payer: Self-pay

## 2020-04-09 DIAGNOSIS — Z955 Presence of coronary angioplasty implant and graft: Secondary | ICD-10-CM | POA: Insufficient documentation

## 2020-04-09 DIAGNOSIS — Z87891 Personal history of nicotine dependence: Secondary | ICD-10-CM | POA: Diagnosis not present

## 2020-04-09 LAB — GLUCOSE, CAPILLARY
Glucose-Capillary: 149 mg/dL — ABNORMAL HIGH (ref 70–99)
Glucose-Capillary: 116 mg/dL — ABNORMAL HIGH (ref 70–99)

## 2020-04-09 NOTE — Progress Notes (Signed)
Daily Session Note  Patient Details  Name: Elizabeth Park MRN: [id number] Date of Birth: [birth year] Referring Provider:   Flowsheet Row CARDIAC REHAB PHASE II ORIENTATION from 03/30/2020 in [facility]  Referring Provider Delorise Royals MD      Encounter Date: 04/09/2020  Check In:  Session Check In - 04/09/20 1337      Check-In   Supervising physician immediately available to respond to emergencies [facility] immediately available    Physician(s) Dr. Horris Latino    Location [facility]    Staff Present Seward Carol, MS, ACSM CEP, Exercise Physiologist;David Rowlett, MS, EP-C, CCRP;Ivyrose Hashman Wilber Oliphant, RN, Isaac Laud, MS, ACSM-CEP, Exercise Physiologist;Annedrea Rosezella [place], RN, MHA;Other    Virtual Visit No    Medication changes reported     No    Fall or balance concerns reported    No    Tobacco Cessation No Change    Current number of cigarettes/nicotine per day     0    Warm-up and Cool-down Performed on first and last piece of equipment    Resistance Training Performed Yes    VAD Patient? No    PAD/SET Patient? No      Pain Assessment   Currently in Pain? No/denies    Multiple Pain Sites No           Capillary Blood Glucose: Results for orders placed or performed during the hospital encounter of 04/09/20 (from the past 24 hour(s))  Glucose, capillary     Status: Abnormal   Collection Time: 04/09/20  1:17 PM  Result Value Ref Range   Glucose-Capillary 149 (H) 70 - 99 mg/dL     Exercise Prescription Changes - 04/09/20 1400      Response to Exercise   Blood Pressure (Admit) 112/70    Blood Pressure (Exercise) 140/80    Blood Pressure (Exit) 120/66    Heart Rate (Admit) 74 bpm    Heart Rate (Exercise) 86 bpm    Heart Rate (Exit) 66 bpm    Rating of Perceived Exertion (Exercise) 12    Symptoms None    Comments Pt's first day of exercise in the [facility] program    Duration Progress to 30 minutes of   aerobic without signs/symptoms of physical distress    Intensity THRR unchanged      Progression   Progression Continue to progress workloads to maintain intensity without signs/symptoms of physical distress.    Average METs 2.74      Resistance Training   Training Prescription Yes    Weight 3lbs    Reps 10-15    Time 10 Minutes      Interval Training   Interval Training No      Treadmill   MPH 2.4    Grade 1    Minutes 15    METs 3.17      NuStep   Level 2    SPM 95    Minutes 15    METs 2.3           Social History   Tobacco Use  Smoking Status Former Smoker  . Packs/day: 1.00  . Years: 28.00  . Pack years: 28.00  . Types: Cigarettes  . Quit date: 03/31/2009  . Years since quitting: 11.0  Smokeless Tobacco Never Used    Goals Met:  Exercise tolerated well Personal goals reviewed No report of cardiac concerns or symptoms Strength training completed today  Goals Unmet:  Not Applicable  Comments:  Pt started cardiac rehab today.  Pt tolerated light exercise without difficulty.  Looking forward to working with Delcie again. Maurice Small RN, BSN Cardiac and Pulmonary Rehab Nurse Navigator     Dr. Fransico Him is Medical Director for Cardiac Rehab at [facility].

## 2020-04-09 NOTE — Progress Notes (Signed)
Cardiac Individual Treatment Plan  Patient Details  Name: Elizabeth Park MRN: [id number] Date of Birth: 04/21/1970 Referring Provider:   Flowsheet Row CARDIAC REHAB PHASE II ORIENTATION from 03/30/2020 in [place]  Referring Provider Delorise Royals MD      Initial Encounter Date:  [place] PHASE II ORIENTATION from 03/30/2020 in [place]  Date 03/30/20      Visit Diagnosis: Status post coronary artery stent placement  Patient's Home Medications on Admission:  Current Outpatient Medications:  .  aspirin 81 MG tablet, Take 81 mg by mouth daily. , Disp: , Rfl:  .  carvedilol (COREG) 3.125 MG tablet, TAKE 1 TABLET BY MOUTH 2 TIMES DAILY. (Patient taking differently: Take 3.125 mg by mouth 2 (two) times daily with a meal.), Disp: 180 tablet, Rfl: 1 .  clopidogrel (PLAVIX) 75 MG tablet, TAKE 1 TABLET (75 MG TOTAL) BY MOUTH DAILY WITH BREAKFAST., Disp: 90 tablet, Rfl: 3 .  ergocalciferol (VITAMIN D2) 1.25 MG (50000 UT) capsule, Take 50,000 Units by mouth once a week. Sunday, Disp: , Rfl:  .  gabapentin (NEURONTIN) 300 MG capsule, Take 300 mg by mouth 3 (three) times daily. , Disp: , Rfl:  .  insulin aspart (NOVOLOG) 100 UNIT/ML injection, Inject 8-12 Units into the skin See admin instructions. Per sliding scale Under 150 no units 150-199 take 8 units 200-249 take 10 units 250-299 take 12 units, Disp: , Rfl:  .  isosorbide mononitrate (IMDUR) 30 MG 24 hr tablet, TAKE 0.5 TABLETS (15 MG TOTAL) BY MOUTH DAILY., Disp: 45 tablet, Rfl: 1 .  Lancets (ONETOUCH DELICA PLUS [id number]) MISC, USE TO CHECK BLOOD SUGARS 3 TIMES A DAY (NEED PART B CARD), Disp: , Rfl:  .  metFORMIN (GLUCOPHAGE-XR) 500 MG 24 hr tablet, Take 500 mg by mouth in the morning and at bedtime. , Disp: , Rfl:  .  NARCAN 4 MG/0.1ML LIQD nasal spray kit, Place 0.4 mg into the nose once. , Disp: , Rfl:  .  nitroGLYCERIN (NITROSTAT) 0.4 MG SL  tablet, PLACE 1 TABLET UNDER THE TONGUE EVERY 5 MINUTES AS NEEDED FOR CHEST PAIN (UP TO 3 DOSES/15MIN) (Patient taking differently: Place 0.4 mg under the tongue every 5 (five) minutes as needed for chest pain.), Disp: 75 tablet, Rfl: 2 .  ondansetron (ZOFRAN) 4 MG tablet, Take 4 mg by mouth every 8 (eight) hours as needed for nausea or vomiting. , Disp: , Rfl:  .  ONETOUCH VERIO test strip, USE TO CHECK BLOOD SUGAR 3 TIMES A DAY (NEED PART B CARD), Disp: , Rfl:  .  oxyCODONE-acetaminophen (PERCOCET) 10-325 MG per tablet, Take 1 tablet by mouth 5 (five) times daily. , Disp: , Rfl:  .  pantoprazole (PROTONIX) 40 MG tablet, Take 1 tablet (40 mg total) by mouth daily., Disp: 30 tablet, Rfl: 11 .  rosuvastatin (CRESTOR) 40 MG tablet, TAKE 1 TABLET (40 MG TOTAL) EVERY EVENING BY MOUTH. (Patient taking differently: Take 40 mg by mouth at bedtime.), Disp: 90 tablet, Rfl: 3 .  Semaglutide 1 MG/DOSE SOPN, Inject 1 mg into the skin every Sunday. "Ozempic", Disp: , Rfl:  .  TRESIBA FLEXTOUCH 100 UNIT/ML SOPN FlexTouch Pen, Inject 60 Units as directed at bedtime. , Disp: , Rfl: 4 .  zolpidem (AMBIEN) 5 MG tablet, TAKE 1 TABLET BY MOUTH EVERY DAY AT BEDTIME AS NEEDED FOR SLEEP (Patient taking differently: Take 5 mg by mouth at bedtime. TAKE 1 TABLET BY MOUTH  EVERY DAY AT BEDTIME AS NEEDED FOR SLEE), Disp: 30 tablet, Rfl: 5  Past Medical History: Past Medical History:  Diagnosis Date  . Chronic diastolic CHF (congestive heart failure) ([place])   . Coronary artery disease    a. 4v CABG  (LIMA to LAD, SVG to subbranch of ramus intermediate, SVG to lateral subbranch of ramus intermediate, SVG to distal RCA) and mitral valve repair January 2011. b. Abnormal nuc 09/2016 s/p  drug eluting stent to the vein graft to the PDA, a drug eluting stent in the native Ramus intermediate branch and a drug eluting stent in the protected left main. c. DES to ramus intermediate 02/2017.   . Diabetes mellitus   . H/O emotional problems    . Headache, migraine   . High cholesterol   . History of blood transfusion   . History of nuclear stress test    Myoview 07/2019: EF 60, normal perfusion, low risk.  [place] Hypertension   . MI, old   . MRSA (methicillin resistant Staphylococcus aureus)   . MVP (mitral valve prolapse)   . Neuromuscular disorder ([place])   . Neuropathy   . S/P mitral valve repair   . Sebaceous cyst     Tobacco Use: Social History   Tobacco Use  Smoking Status Former Smoker  . Packs/day: 1.00  . Years: 28.00  . Pack years: 28.00  . Types: Cigarettes  . Quit date: 03/31/2009  . Years since quitting: 11.0  Smokeless Tobacco Never Used    Labs: Recent Chemical engineer    Labs for ITP Cardiac and Pulmonary Rehab Latest Ref Rng & Units 05/28/2009 09/02/2009 04/12/2017 11/14/2018 01/26/2020   Cholestrol 100 - 199 mg/dL - - 126 148 136   LDLCALC 0 - 99 mg/dL - - 61 55 68   HDL >39 mg/dL - - 32(L) 36(L) 35(L)   Trlycerides 0 - 149 mg/dL - - 164(H) 285(H) 200(H)   Hemoglobin A1c % - 6.5 - - -   PHART 7.350 - 7.400 7.454(H) - - - -   PCO2ART 35.0 - 45.0 mmHg 33.9(L) - - - -   HCO3 20.0 - 24.0 mEq/L 23.4 - - - -   TCO2 0 - 100 mmol/L 24.5 - - - -   ACIDBASEDEF 0.0 - 2.0 mmol/L 0.0 - - - -   O2SAT % 97.9 - - - -      Capillary Blood Glucose: Lab Results  Component Value Date   GLUCAP 116 (H) 04/09/2020   GLUCAP 149 (H) 04/09/2020   GLUCAP 138 (H) 03/30/2020   GLUCAP 122 (H) 01/30/2020   GLUCAP 119 (H) 01/30/2020     Exercise Target Goals: Exercise Program Goal: Individual exercise prescription set using results from initial 6 min walk test and THRR while considering  patient's activity barriers and safety.   Exercise Prescription Goal: Starting with aerobic activity 30 plus minutes a day, 3 days per week for initial exercise prescription. Provide home exercise prescription and guidelines that participant acknowledges understanding prior to discharge.  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 HR 85 bpm     Max Ex. BP 156/80     2 Minute Post BP 142/84            Oxygen Initial Assessment:   Oxygen Re-Evaluation:   Oxygen Discharge (Final Oxygen Re-Evaluation):   Initial Exercise Prescription:  Initial Exercise Prescription - 03/30/20 1500      Date of Initial Exercise RX and Referring Provider   Date 03/30/20    Referring Provider Delorise Royals MD    Expected Discharge Date 05/28/20      Treadmill   MPH 2.7    Grade 1    Minutes 15    METs 3.07      NuStep   Level 2    SPM 85    Minutes 15    METs 2.2      Prescription Details   Frequency (times per week) 3    Duration Progress to 30 minutes of continuous aerobic without signs/symptoms of physical distress      Intensity   THRR 40-80% of Max Heartrate 68-135    Ratings of Perceived Exertion 11-13    Perceived Dyspnea 0-4      Progression   Progression Continue progressive overload as per policy without signs/symptoms or physical distress.      Resistance Training   Training Prescription Yes    Weight 3lbs    Reps 10-15           Perform Capillary Blood Glucose checks as needed.  Exercise Prescription Changes:   Exercise Prescription Changes    Row Name 04/09/20 1400              Response to Exercise   Blood Pressure (Admit) 112/70       Blood Pressure (Exercise) 140/80       Blood Pressure (Exit) 120/66       Heart Rate (Admit) 74 bpm       Heart Rate (Exercise) 86 bpm       Heart Rate (Exit) 66 bpm       Rating of Perceived Exertion (Exercise) 12       Symptoms None       Comments Pt's first day of exercise in the CRP2 program       Duration Progress to 30 minutes of  aerobic without signs/symptoms of physical distress       Intensity THRR unchanged               Progression   Progression Continue to progress workloads to maintain intensity without signs/symptoms of physical distress.       Average METs 2.74               Resistance Training   Training Prescription Yes       Weight 3lbs       Reps 10-15  Time 10 Minutes               Interval Training   Interval Training No               Treadmill   MPH 2.4       Grade 1       Minutes 15       METs 3.17               NuStep   Level 2       SPM 95       Minutes 15       METs 2.3              Exercise Comments:   Exercise Comments    Row Name 04/09/20 1447           Exercise Comments Pt's first day of exercise in the CRP2 program. Pt tolerated session well.              Exercise Goals and Review:   Exercise Goals    Row Name 03/30/20 1501             Exercise Goals   Increase Physical Activity Yes       Intervention Provide advice, education, support and counseling about physical activity/exercise needs.;Develop an individualized exercise prescription for aerobic and resistive training based on initial evaluation findings, risk stratification, comorbidities and participant's personal goals.       Expected Outcomes Short Term: Attend rehab on a regular basis to increase amount of physical activity.;Long Term: Add in home exercise to make exercise part of routine and to increase amount of physical activity.;Long Term: Exercising regularly at least 3-5  days a week.       Increase Strength and Stamina Yes       Intervention Provide advice, education, support and counseling about physical activity/exercise needs.;Develop an individualized exercise prescription for aerobic and resistive training based on initial evaluation findings, risk stratification, comorbidities and participant's personal goals.       Expected Outcomes Short Term: Increase workloads from initial exercise prescription for resistance, speed, and METs.;Short Term: Perform resistance training exercises routinely during rehab and add in resistance training at home;Long Term: Improve cardiorespiratory fitness, muscular endurance and strength as measured by increased METs and functional capacity (6MWT)       Able to understand and use rate of perceived exertion (RPE) scale Yes       Intervention Provide education and explanation on how to use RPE scale       Expected Outcomes Short Term: Able to use RPE daily in rehab to express subjective intensity level;Long Term:  Able to use RPE to guide intensity level when exercising independently       Knowledge and understanding of Target Heart Rate Range (THRR) Yes       Intervention Provide education and explanation of THRR including how the numbers were predicted and where they are located for reference       Expected Outcomes Short Term: Able to state/look up THRR;Short Term: Able to use daily as guideline for intensity in rehab;Long Term: Able to use THRR to govern intensity when exercising independently       Understanding of Exercise Prescription Yes       Intervention Provide education, explanation, and written materials on patient's individual exercise prescription       Expected Outcomes Short Term: Able to explain program exercise prescription;Long Term: Able to explain home exercise prescription  to exercise independently              Exercise Goals Re-Evaluation :  Exercise Goals Re-Evaluation    [place] Name 04/09/20 1444              Exercise Goal Re-Evaluation   Exercise Goals Review Increase Physical Activity;Increase Strength and Stamina;Able to understand and use rate of perceived exertion (RPE) scale;Knowledge and understanding of Target Heart Rate Range (THRR);Understanding of Exercise Prescription       Comments Pt's first day of exercise in the cRP2 program. Pt understands the RPE scale, THRR, and exercise Rx. Tolerated initial session well with no comaplaints.       Expected Outcomes Will continue to monitor pt progress and advance exercise workloads as tolerated.               Discharge Exercise Prescription (Final Exercise Prescription Changes):  Exercise Prescription Changes - 04/09/20 1400      Response to Exercise   Blood Pressure (Admit) 112/70    Blood Pressure (Exercise) 140/80    Blood Pressure (Exit) 120/66    Heart Rate (Admit) 74 bpm    Heart Rate (Exercise) 86 bpm    Heart Rate (Exit) 66 bpm    Rating of Perceived Exertion (Exercise) 12    Symptoms None    Comments Pt's first day of exercise in the CRP2 program    Duration Progress to 30 minutes of  aerobic without signs/symptoms of physical distress    Intensity THRR unchanged      Progression   Progression Continue to progress workloads to maintain intensity without signs/symptoms of physical distress.    Average METs 2.74      Resistance Training   Training Prescription Yes    Weight 3lbs    Reps 10-15    Time 10 Minutes      Interval Training   Interval Training No      Treadmill   MPH 2.4    Grade 1    Minutes 15    METs 3.17      NuStep   Level 2    SPM 95    Minutes 15    METs 2.3           Nutrition:  Target Goals: Understanding of nutrition guidelines, daily intake of sodium <1561m, cholesterol <2071m calories 30% from fat and 7% or less from saturated fats, daily to have 5 or more servings of fruits and vegetables.  Biometrics:  Pre Biometrics - 03/30/20 1400      Pre Biometrics   Height 5'  4.5" (1.638 m)    Weight 95.7 kg    Waist Circumference 48 inches    Hip Circumference 47 inches    Waist to Hip Ratio 1.02 %    BMI (Calculated) 35.67    Triceps Skinfold 30 mm    % Body Fat 46.9 %    Grip Strength 37 kg    Flexibility 10.75 in    Single Leg Stand 17.25 seconds            Nutrition Therapy Plan and Nutrition Goals:   Nutrition Assessments:  MEDIFICTS Score Key:  ?70 Need to make dietary changes   40-70 Heart Healthy Diet  ? 40 Therapeutic Level Cholesterol Diet   Picture Your Plate Scores:  <4<40nhealthy dietary pattern with much room for improvement.  41-50 Dietary pattern unlikely to meet recommendations for good health and room for improvement.  51-60 More healthful dietary pattern,  with some room for improvement.   >60 Healthy dietary pattern, although there may be some specific behaviors that could be improved.    Nutrition Goals Re-Evaluation:   Nutrition Goals Discharge (Final Nutrition Goals Re-Evaluation):   Psychosocial: Target Goals: Acknowledge presence or absence of significant depression and/or stress, maximize coping skills, provide positive support system. Participant is able to verbalize types and ability to use techniques and skills needed for reducing stress and depression.  Initial Review & Psychosocial Screening:  Initial Psych Review & Screening - 03/30/20 1359      Initial Review   Current issues with None Identified      Family Dynamics   Good Support System? Yes    Comments Ms. Sweeden presents to her cardiac rehab orientation with a positive attitude and outlook. This is her 3rd time attending cardiac rehab and she is thankful for the opportunity. She admits to a strong support system including her fiance and 42 year old son of the home. They are making lifestyle changes with her. She loves doing crafts. States she has no stress in her life. No psychosocial barriers to self health management or participation in  cardiac rehab identified. No interventions needed.      Barriers   Psychosocial barriers to participate in program There are no identifiable barriers or psychosocial needs.      Screening Interventions   Interventions Encouraged to exercise;Provide feedback about the scores to participant           Quality of Life Scores:  Quality of Life - 03/30/20 1451      Quality of Life   Select Quality of Life      Quality of Life Scores   Health/Function Pre 28.4 %    Socioeconomic Pre 30 %    Psych/Spiritual Pre 30 %    Family Pre 28.8 %    GLOBAL Pre 29.14 %          Scores of 19 and below usually indicate a poorer quality of life in these areas.  A difference of  2-3 points is a clinically meaningful difference.  A difference of 2-3 points in the total score of the Quality of Life Index has been associated with significant improvement in overall quality of life, self-image, physical symptoms, and general health in studies assessing change in quality of life.  PHQ-9: Recent Review Flowsheet Data    Depression screen [facility] 2/9 03/30/2020 07/04/2017 07/04/2017 04/25/2017 04/10/2017   Decreased Interest 0 0 0 0 0   Down, Depressed, Hopeless 0 0 0 0 0    PHQ - 2 Score 0 0 0 0 0     Interpretation of Total Score  Total Score Depression Severity:  1-4 = Minimal depression, 5-9 = Mild depression, 10-14 = Moderate depression, 15-19 = Moderately severe depression, 20-27 = Severe depression   Psychosocial Evaluation and Intervention:   Psychosocial Re-Evaluation:  Psychosocial Re-Evaluation    Row Name 04/09/20 1610             Psychosocial Re-Evaluation   Current issues with None Identified       Interventions Encouraged to attend Cardiac Rehabilitation for the exercise;Stress management education       Continue Psychosocial Services  No Follow up required              Psychosocial Discharge (Final Psychosocial Re-Evaluation):  Psychosocial Re-Evaluation - 04/09/20 1610       Psychosocial Re-Evaluation   Current issues with None Identified  Interventions Encouraged to attend Cardiac Rehabilitation for the exercise;Stress management education    Continue Psychosocial Services  No Follow up required           Vocational Rehabilitation: Provide vocational rehab assistance to qualifying candidates.   Vocational Rehab Evaluation & Intervention:  Vocational Rehab - 03/30/20 1359      Initial Vocational Rehab Evaluation & Intervention   Assessment shows need for Vocational Rehabilitation No      Vocational Rehab Re-Evaulation   Comments Patient is disabled and has not worked in "years"           Education: Education Goals: Education classes will be provided on a weekly basis, covering required topics. Participant will state understanding/return demonstration of topics presented.  Learning Barriers/Preferences:  Learning Barriers/Preferences - 03/30/20 1456      Learning Barriers/Preferences   Learning Barriers Sight   wears glasses   Learning Preferences Group Instruction;Individual Instruction;Pictoral;Skilled Demonstration;Computer/Internet           Education Topics: Hypertension, Hypertension Reduction -Define heart disease and high blood pressure. Discus how high blood pressure affects the body and ways to reduce high blood pressure.   Exercise and Your Heart -Discuss why it is important to exercise, the FITT principles of exercise, normal and abnormal responses to exercise, and how to exercise safely.   Angina -Discuss definition of angina, causes of angina, treatment of angina, and how to decrease risk of having angina.   Cardiac Medications -Review what the following cardiac medications are used for, how they affect the body, and side effects that may occur when taking the medications.  Medications include Aspirin, Beta blockers, calcium channel blockers, ACE Inhibitors, angiotensin receptor blockers, diuretics, digoxin, and  antihyperlipidemics.   Congestive Heart Failure -Discuss the definition of CHF, how to live with CHF, the signs and symptoms of CHF, and how keep track of weight and sodium intake.   Heart Disease and Intimacy -Discus the effect sexual activity has on the heart, how changes occur during intimacy as we age, and safety during sexual activity.   Smoking Cessation / COPD -Discuss different methods to quit smoking, the health benefits of quitting smoking, and the definition of COPD.   Nutrition I: Fats -Discuss the types of cholesterol, what cholesterol does to the heart, and how cholesterol levels can be controlled.   Nutrition II: Labels -Discuss the different components of food labels and how to read food label   Heart Parts/Heart Disease and PAD -Discuss the anatomy of the heart, the pathway of blood circulation through the heart, and these are affected by heart disease.   Stress I: Signs and Symptoms -Discuss the causes of stress, how stress may lead to anxiety and depression, and ways to limit stress.   Stress II: Relaxation -Discuss different types of relaxation techniques to limit stress.   Warning Signs of Stroke / TIA -Discuss definition of a stroke, what the signs and symptoms are of a stroke, and how to identify when someone is having stroke.   Knowledge Questionnaire Score:  Knowledge Questionnaire Score - 03/30/20 1452      Knowledge Questionnaire Score   Pre Score 21/24           Core Components/Risk Factors/Patient Goals at Admission:  Personal Goals and Risk Factors at Admission - 03/30/20 1452      Core Components/Risk Factors/Patient Goals on Admission    Weight Management Yes;Obesity;Weight Loss    Intervention Weight Management: Develop a combined nutrition and exercise program designed to reach  desired caloric intake, while maintaining appropriate intake of nutrient and fiber, sodium and fats, and appropriate energy expenditure required for the  weight goal.;Weight Management: Provide education and appropriate resources to help participant work on and attain dietary goals.;Weight Management/Obesity: Establish reasonable short term and long term weight goals.;Obesity: Provide education and appropriate resources to help participant work on and attain dietary goals.    Admit Weight 210 lb 15.7 oz (95.7 kg)    Goal Weight: Long Term 175 lb (79.4 kg)   Patient goal   Expected Outcomes Short Term: Continue to assess and modify interventions until short term weight is achieved;Long Term: Adherence to nutrition and physical activity/exercise program aimed toward attainment of established weight goal;Weight Maintenance: Understanding of the daily nutrition guidelines, which includes 25-35% calories from fat, 7% or less cal from saturated fats, less than 2106m cholesterol, less than 1.5gm of sodium, & 5 or more servings of fruits and vegetables daily;Weight Loss: Understanding of general recommendations for a balanced deficit meal plan, which promotes 1-2 lb weight loss per week and includes a negative energy balance of [phone] kcal/d;Understanding recommendations for meals to include 15-35% energy as protein, 25-35% energy from fat, 35-60% energy from carbohydrates, less than 2097mof dietary cholesterol, 20-35 gm of total fiber daily;Understanding of distribution of calorie intake throughout the day with the consumption of 4-5 meals/snacks    Diabetes Yes    Intervention Provide education about signs/symptoms and action to take for hypo/hyperglycemia.;Provide education about proper nutrition, including hydration, and aerobic/resistive exercise prescription along with prescribed medications to achieve blood glucose in normal ranges: Fasting glucose 65-99 mg/dL    Expected Outcomes Short Term: Participant verbalizes understanding of the signs/symptoms and immediate care of hyper/hypoglycemia, proper foot care and importance of medication, aerobic/resistive  exercise and nutrition plan for blood glucose control.;Long Term: Attainment of HbA1C < 7%.    Hypertension Yes    Intervention Provide education on lifestyle modifcations including regular physical activity/exercise, weight management, moderate sodium restriction and increased consumption of fresh fruit, vegetables, and low fat dairy, alcohol moderation, and smoking cessation.;Monitor prescription use compliance.    Expected Outcomes Short Term: Continued assessment and intervention until BP is < 140/9065mG in hypertensive participants. < 130/89m107m in hypertensive participants with diabetes, heart failure or chronic kidney disease.;Long Term: Maintenance of blood pressure at goal levels.    Lipids Yes    Intervention Provide education and support for participant on nutrition & aerobic/resistive exercise along with prescribed medications to achieve LDL <70mg87mL >40mg.27mExpected Outcomes Short Term: Participant states understanding of desired cholesterol values and is compliant with medications prescribed. Participant is following exercise prescription and nutrition guidelines.;Long Term: Cholesterol controlled with medications as prescribed, with individualized exercise RX and with personalized nutrition plan. Value goals: LDL < 70mg, 29m> 40 mg.           Core Components/Risk Factors/Patient Goals Review:   Goals and Risk Factor Review    Row Name 04/09/20 1611             Core Components/Risk Factors/Patient Goals Review   Personal Goals Review Weight Management/Obesity;Hypertension;Diabetes;Lipids       Review Jayne started exercise on 04/09/20 tolerated well. Vital signs and CBG's are stable today       Expected Outcomes Srinika will continue to participate in phase 2 cardiac rehab for exercise, nutrition and lifestyle modifications.              Core Components/Risk Factors/Patient Goals at  Discharge (Final Review):   Goals and Risk Factor Review - 04/09/20 1611      Core  Components/Risk Factors/Patient Goals Review   Personal Goals Review Weight Management/Obesity;Hypertension;Diabetes;Lipids    Review Lynzee started exercise on 04/09/20 tolerated well. Vital signs and CBG's are stable today    Expected Outcomes Yvonna will continue to participate in phase 2 cardiac rehab for exercise, nutrition and lifestyle modifications.           ITP Comments:  ITP Comments    Row Name 03/30/20 1350 04/09/20 1609         ITP Comments Dr. Fransico Him, Medical Director [facility] Day ITP Review. Aby started exercise on 04/09/20 tolerated well             Comments: See ITP comments.Barnet Pall, RN,BSN 04/13/2020 3:18 PM

## 2020-04-12 ENCOUNTER — Encounter (HOSPITAL_COMMUNITY): Payer: Medicare HMO

## 2020-04-12 ENCOUNTER — Telehealth (HOSPITAL_COMMUNITY): Payer: Self-pay | Admitting: Cardiology

## 2020-04-14 ENCOUNTER — Encounter (HOSPITAL_COMMUNITY)
Admission: RE | Admit: 2020-04-14 | Discharge: 2020-04-14 | Disposition: A | Discharge status: Home / Self Care | Payer: Medicare HMO | Source: Ambulatory Visit | Attending: Cardiovascular Disease | Admitting: Cardiovascular Disease

## 2020-04-14 ENCOUNTER — Other Ambulatory Visit: Payer: Self-pay

## 2020-04-14 DIAGNOSIS — Z955 Presence of coronary angioplasty implant and graft: Secondary | ICD-10-CM

## 2020-04-14 LAB — GLUCOSE, CAPILLARY
Glucose-Capillary: 155 mg/dL — ABNORMAL HIGH (ref 70–99)
Glucose-Capillary: 93 mg/dL (ref 70–99)

## 2020-04-14 NOTE — Progress Notes (Addendum)
Skyleigh M Pitz 50 y.o. female Nutrition Note   Visit Diagnosis: Status post coronary artery stent placement  Past Medical History:  Diagnosis Date  . Chronic diastolic CHF (congestive heart failure) ([place])   . Coronary artery disease    a. 4v CABG  (LIMA to LAD, SVG to subbranch of ramus intermediate, SVG to lateral subbranch of ramus intermediate, SVG to distal RCA) and mitral valve repair January 2011. b. Abnormal nuc 09/2016 s/p  drug eluting stent to the vein graft to the PDA, a drug eluting stent in the native Ramus intermediate branch and a drug eluting stent in the protected left main. c. DES to ramus intermediate 02/2017.   . Diabetes mellitus   . H/O emotional problems   . Headache, migraine   . High cholesterol   . History of blood transfusion   . History of nuclear stress test    Myoview 07/2019: EF 60, normal perfusion, low risk.  Marland Kitchen Hypertension   . MI, old   . MRSA (methicillin resistant Staphylococcus aureus)   . MVP (mitral valve prolapse)   . Neuromuscular disorder ([place])   . Neuropathy   . S/P mitral valve repair   . Sebaceous cyst      Medications reviewed.   Current Outpatient Medications:  .  aspirin 81 MG tablet, Take 81 mg by mouth daily. , Disp: , Rfl:  .  carvedilol (COREG) 3.125 MG tablet, TAKE 1 TABLET BY MOUTH 2 TIMES DAILY. (Patient taking differently: Take 3.125 mg by mouth 2 (two) times daily with a meal.), Disp: 180 tablet, Rfl: 1 .  clopidogrel (PLAVIX) 75 MG tablet, TAKE 1 TABLET (75 MG TOTAL) BY MOUTH DAILY WITH BREAKFAST., Disp: 90 tablet, Rfl: 3 .  ergocalciferol (VITAMIN D2) 1.25 MG (50000 UT) capsule, Take 50,000 Units by mouth once a week. Sunday, Disp: , Rfl:  .  gabapentin (NEURONTIN) 300 MG capsule, Take 300 mg by mouth 3 (three) times daily. , Disp: , Rfl:  .  insulin aspart (NOVOLOG) 100 UNIT/ML injection, Inject 8-12 Units into the skin See admin instructions. Per sliding scale Under 150 no units 150-199 take 8 units 200-249 take 10 units  250-299 take 12 units, Disp: , Rfl:  .  isosorbide mononitrate (IMDUR) 30 MG 24 hr tablet, TAKE 0.5 TABLETS (15 MG TOTAL) BY MOUTH DAILY., Disp: 45 tablet, Rfl: 1 .  Lancets (ONETOUCH DELICA PLUS [id number]) MISC, USE TO CHECK BLOOD SUGARS 3 TIMES A DAY (NEED PART B CARD), Disp: , Rfl:  .  metFORMIN (GLUCOPHAGE-XR) 500 MG 24 hr tablet, Take 500 mg by mouth in the morning and at bedtime. , Disp: , Rfl:  .  NARCAN 4 MG/0.1ML LIQD nasal spray kit, Place 0.4 mg into the nose once. , Disp: , Rfl:  .  nitroGLYCERIN (NITROSTAT) 0.4 MG SL tablet, PLACE 1 TABLET UNDER THE TONGUE EVERY 5 MINUTES AS NEEDED FOR CHEST PAIN (UP TO 3 DOSES/15MIN) (Patient taking differently: Place 0.4 mg under the tongue every 5 (five) minutes as needed for chest pain.), Disp: 75 tablet, Rfl: 2 .  ondansetron (ZOFRAN) 4 MG tablet, Take 4 mg by mouth every 8 (eight) hours as needed for nausea or vomiting. , Disp: , Rfl:  .  ONETOUCH VERIO test strip, USE TO CHECK BLOOD SUGAR 3 TIMES A DAY (NEED PART B CARD), Disp: , Rfl:  .  oxyCODONE-acetaminophen (PERCOCET) 10-325 MG per tablet, Take 1 tablet by mouth 5 (five) times daily. , Disp: , Rfl:  .  pantoprazole (PROTONIX)  40 MG tablet, Take 1 tablet (40 mg total) by mouth daily., Disp: 30 tablet, Rfl: 11 .  rosuvastatin (CRESTOR) 40 MG tablet, TAKE 1 TABLET (40 MG TOTAL) EVERY EVENING BY MOUTH. (Patient taking differently: Take 40 mg by mouth at bedtime.), Disp: 90 tablet, Rfl: 3 .  Semaglutide 1 MG/DOSE SOPN, Inject 1 mg into the skin every Sunday. "Ozempic", Disp: , Rfl:  .  TRESIBA FLEXTOUCH 100 UNIT/ML SOPN FlexTouch Pen, Inject 60 Units as directed at bedtime. , Disp: , Rfl: 4 .  zolpidem (AMBIEN) 5 MG tablet, TAKE 1 TABLET BY MOUTH EVERY DAY AT BEDTIME AS NEEDED FOR SLEEP (Patient taking differently: Take 5 mg by mouth at bedtime. TAKE 1 TABLET BY MOUTH EVERY DAY AT BEDTIME AS NEEDED FOR SLEE), Disp: 30 tablet, Rfl: 5   Ht Readings from Last 1 Encounters:  03/30/20 5' 4.5" (1.638 m)      Wt Readings from Last 3 Encounters:  03/30/20 210 lb 15.7 oz (95.7 kg)  02/12/20 208 lb 12.8 oz (94.7 kg)  01/30/20 210 lb (95.3 kg)     There is no height or weight on file to calculate BMI.   Social History   Tobacco Use  Smoking Status Former Smoker  . Packs/day: 1.00  . Years: 28.00  . Pack years: 28.00  . Types: Cigarettes  . Quit date: 03/31/2009  . Years since quitting: 11.0  Smokeless Tobacco Never Used     Lab Results  Component Value Date   CHOL 136 01/26/2020   Lab Results  Component Value Date   HDL 35 (L) 01/26/2020   Lab Results  Component Value Date   LDLCALC 68 01/26/2020   Lab Results  Component Value Date   TRIG 200 (H) 01/26/2020     Lab Results  Component Value Date   HGBA1C 6.5 09/02/2009     CBG (last 3)  Recent Labs    04/14/20 1402  GLUCAP 93     Nutrition Note  Spoke with pt. Nutrition Plan and Nutrition Survey goals reviewed with pt. Pt is trying to follow a Heart Healthy diet.   Pt has Type 2 Diabetes. Last A1c indicates blood glucose well-controlled. Pt checks CBG's 4 times a day. Fasting CBG's reportedly 200-210 mg/dL. Hypo aware in 70's. She treats with carbohydrates. She checks CBGs fasting and before meals. Before meals: 190-200 mg/dl. Most recent A1C was 7.2 which was down from >10  She gets into the 300's mg/dl occasionally. She was recently prescribed freestyle libre 2.  We placed this today.  Pt does not always take insulin pen with her when she leaves the house and misses doses. Pt denies diabetes distress.   Documentation for Freestyle Libre Continuous glucose monitoring Freestyle Libre CGM sensor placed today. Patient was educated about wearing sensor. Pt educated on avoiding doses of vitamin c >500 mg/day.Patient was educated about how to care for the sensor and not to have an MRI, CT or Diathermy while wearing the sensor. Follow up was arranged with the patient for 1 week.   Intake: Appetite is  average. Pt eats once per day (typically dinner). No salt shaker. No label reading. No sugary beverages No meal planning. Tries to avoid fried foods. Eats snack packs often (cheese/nuts/craisins or celery and carrots and hummus) Eats out once per day.  Pt expressed understanding of the information reviewed.    Nutrition Diagnosis ? Inconsistent carbohydrate intake related to pt appetite and preferences and lack of food related knowledge as evidenced by  pt eating once per day, morning nausea, fasting CBGs 200 mg/dl, and random CBGs 300 mg/dl.   Nutrition Intervention ? Pt's individual nutrition plan reviewed with pt. ? Benefits of adopting Heart Healthy diet discussed when Picture your plate reviewed.   ? Continue client-centered nutrition education by RD, as part of interdisciplinary care.  Goal(s) ? Pt to build a healthy plate including vegetables, fruits, whole grains, and low-fat dairy products in a heart healthy meal plan. ? CBG concentrations in the normal range or as close to normal as is safely possible. ? Pt to take insulin pen with her when she leaves the house to avoid missed doses  Plan:   Will provide client-centered nutrition education as part of interdisciplinary care  Monitor and evaluate progress toward nutrition goal with team.   Michaele Offer, MS, RDN, LDN

## 2020-04-16 ENCOUNTER — Other Ambulatory Visit: Payer: Self-pay

## 2020-04-16 ENCOUNTER — Encounter (HOSPITAL_COMMUNITY)
Admission: RE | Admit: 2020-04-16 | Discharge: 2020-04-16 | Disposition: A | Discharge status: Home / Self Care | Payer: Medicare HMO | Source: Ambulatory Visit | Attending: Cardiovascular Disease | Admitting: Cardiovascular Disease

## 2020-04-16 DIAGNOSIS — Z955 Presence of coronary angioplasty implant and graft: Secondary | ICD-10-CM

## 2020-04-16 LAB — GLUCOSE, CAPILLARY: Glucose-Capillary: 83 mg/dL (ref 70–99)

## 2020-04-19 ENCOUNTER — Encounter (HOSPITAL_COMMUNITY): Payer: Medicare HMO

## 2020-04-21 ENCOUNTER — Encounter (HOSPITAL_COMMUNITY)
Admission: RE | Admit: 2020-04-21 | Discharge: 2020-04-21 | Disposition: A | Discharge status: Home / Self Care | Payer: Medicare HMO | Source: Ambulatory Visit | Attending: Cardiovascular Disease | Admitting: Cardiovascular Disease

## 2020-04-21 ENCOUNTER — Other Ambulatory Visit: Payer: Self-pay

## 2020-04-21 DIAGNOSIS — Z955 Presence of coronary angioplasty implant and graft: Secondary | ICD-10-CM

## 2020-04-23 ENCOUNTER — Encounter (HOSPITAL_COMMUNITY): Payer: Medicare HMO

## 2020-04-26 ENCOUNTER — Other Ambulatory Visit: Payer: Self-pay

## 2020-04-26 ENCOUNTER — Encounter (HOSPITAL_COMMUNITY)
Admission: RE | Admit: 2020-04-26 | Discharge: 2020-04-26 | Disposition: A | Discharge status: Home / Self Care | Payer: Medicare HMO | Source: Ambulatory Visit | Attending: Cardiovascular Disease | Admitting: Cardiovascular Disease

## 2020-04-26 DIAGNOSIS — Z955 Presence of coronary angioplasty implant and graft: Secondary | ICD-10-CM | POA: Diagnosis not present

## 2020-04-28 ENCOUNTER — Encounter (HOSPITAL_COMMUNITY)
Admission: RE | Admit: 2020-04-28 | Discharge: 2020-04-28 | Disposition: A | Discharge status: Home / Self Care | Payer: Medicare HMO | Source: Ambulatory Visit | Attending: Cardiovascular Disease | Admitting: Cardiovascular Disease

## 2020-04-28 ENCOUNTER — Other Ambulatory Visit: Payer: Self-pay

## 2020-04-28 DIAGNOSIS — Z955 Presence of coronary angioplasty implant and graft: Secondary | ICD-10-CM | POA: Diagnosis not present

## 2020-04-28 NOTE — Progress Notes (Signed)
Freestyle Libre Training  [place] was able to exhibit and verbalize knowledge today about the following:     She placed sensor with minimal assistance. -Pt uses phone for freestyle libre reader. -Pt able to use trend arrows, daily graph, and blood sugar reading to make treatment decisions. -Inserting sensor - Right arm  -Calibrating - none required but can confirm SG with finger stick BG. Pt did this a few times during the past 2 weeks. -She ended sensor session of 14 days and reapplied next sensor. -Charging the battery of the reader -Trouble shooting/ contraindications - Downloading capabilities; report downloaded and reviewed with Lilya.  Continuous glucose monitoring download:      Average is    172  for 14 days   Time sensor is active   89 %   Time in range (70-180 mg/dL):  65 % (Goal >70%)   Time High (181-250 mg/dL)  32 % (Goal < 25%)   Time Very High (>250 mg/dl)  3 % (Goal < 5%)   Time Low (54-69 mg/dL)  0 % (Goal is <4%)   Time Very Low (<54)  0%  (Goal <1%)   Coefficient of variation  21.6% (Goal is <36%)      Patient has Freestyle tech support contact and my information for questions or concerns.  George Ina, RD 04/30/2020 7:59 AM.

## 2020-04-30 ENCOUNTER — Other Ambulatory Visit: Payer: Self-pay

## 2020-04-30 ENCOUNTER — Encounter (HOSPITAL_COMMUNITY)
Admission: RE | Admit: 2020-04-30 | Discharge: 2020-04-30 | Disposition: A | Discharge status: Home / Self Care | Payer: Medicare HMO | Source: Ambulatory Visit | Attending: Cardiovascular Disease | Admitting: Cardiovascular Disease

## 2020-04-30 DIAGNOSIS — Z955 Presence of coronary angioplasty implant and graft: Secondary | ICD-10-CM | POA: Diagnosis not present

## 2020-05-03 ENCOUNTER — Encounter (HOSPITAL_COMMUNITY)
Admission: RE | Admit: 2020-05-03 | Discharge: 2020-05-03 | Disposition: A | Discharge status: Home / Self Care | Payer: Medicare HMO | Source: Ambulatory Visit | Attending: Cardiovascular Disease | Admitting: Cardiovascular Disease

## 2020-05-03 ENCOUNTER — Other Ambulatory Visit: Payer: Self-pay

## 2020-05-03 DIAGNOSIS — Z955 Presence of coronary angioplasty implant and graft: Secondary | ICD-10-CM

## 2020-05-03 NOTE — Progress Notes (Signed)
Nutrition Note   Spoke with pt today about carbohydrate counting, and eating a consistent amount of carbohydrates across the day. Reviewed the benefits of carbohydrate counting and eating a consistent amount of carbohydrates across the day. Showed pt how to calculate carbohydrate servings, and distributed handouts for patient to practice. Recommended pt eat 2-3 servings of carbohydrates at meals. Discussed the importance of creating a balanced meal with the addition of protein and non-starchy vegetables. Discussed ways to make a balanced meal.  Pt has been working with MD to adjust insulin to appropriate doses.  Pt verbalized understanding of material discussed today. Distributed RD contact information.     Michaele Offer, MS, RDN, LDN

## 2020-05-04 NOTE — Progress Notes (Signed)
Reviewed home exercise Rx with patient today. Pt will walk at home 2-3 x/week for 30-45 minutes. Encouraged warm-up, cool-down and stretching. Reviewed THRR of 68-135 and keeping RPE between 11-13. Fluids encouraged before, during and after activity. Discussed weather parameters for temperature and humidity for safe exercise outdoors. Reviewed S/S that would require patients to terminate exercise and when to call MD vs 911. Also reviewed NTG use and storage and patient encouraged to carry at all times. Pt verbalized understanding of the home exercise RX and was provided a copy.   Lesly Rubenstein MS, ACSM-EP-C, CCRP

## 2020-05-05 ENCOUNTER — Other Ambulatory Visit: Payer: Self-pay

## 2020-05-05 ENCOUNTER — Encounter (HOSPITAL_COMMUNITY)
Admission: RE | Admit: 2020-05-05 | Discharge: 2020-05-05 | Disposition: A | Discharge status: Home / Self Care | Payer: Medicare HMO | Source: Ambulatory Visit | Attending: Cardiovascular Disease | Admitting: Cardiovascular Disease

## 2020-05-05 DIAGNOSIS — Z48812 Encounter for surgical aftercare following surgery on the circulatory system: Secondary | ICD-10-CM | POA: Insufficient documentation

## 2020-05-05 DIAGNOSIS — Z955 Presence of coronary angioplasty implant and graft: Secondary | ICD-10-CM | POA: Diagnosis not present

## 2020-05-07 ENCOUNTER — Encounter (HOSPITAL_COMMUNITY)
Admission: RE | Admit: 2020-05-07 | Discharge: 2020-05-07 | Disposition: A | Discharge status: Home / Self Care | Payer: Medicare HMO | Source: Ambulatory Visit | Attending: Cardiovascular Disease | Admitting: Cardiovascular Disease

## 2020-05-07 ENCOUNTER — Other Ambulatory Visit: Payer: Self-pay

## 2020-05-07 DIAGNOSIS — Z48812 Encounter for surgical aftercare following surgery on the circulatory system: Secondary | ICD-10-CM | POA: Diagnosis not present

## 2020-05-07 DIAGNOSIS — Z955 Presence of coronary angioplasty implant and graft: Secondary | ICD-10-CM

## 2020-05-10 ENCOUNTER — Encounter (HOSPITAL_COMMUNITY): Payer: Medicare HMO

## 2020-05-11 NOTE — Progress Notes (Signed)
Cardiac Individual Treatment Plan  Patient Details  Name: Elizabeth Park MRN: [id number] Date of Birth: October 27, 1970 Referring Provider:   Flowsheet Row CARDIAC REHAB PHASE II ORIENTATION from 03/30/2020 in [facility]  Referring Provider Delorise Royals MD      Initial Encounter Date:  [place] PHASE II ORIENTATION from 03/30/2020 in [facility]  Date 03/30/20      Visit Diagnosis: Status post coronary artery stent placement  Patient's Home Medications on Admission:  Current Outpatient Medications:  .  aspirin 81 MG tablet, Take 81 mg by mouth daily. , Disp: , Rfl:  .  carvedilol (COREG) 3.125 MG tablet, TAKE 1 TABLET BY MOUTH 2 TIMES DAILY. (Patient taking differently: Take 3.125 mg by mouth 2 (two) times daily with a meal.), Disp: 180 tablet, Rfl: 1 .  clopidogrel (PLAVIX) 75 MG tablet, TAKE 1 TABLET (75 MG TOTAL) BY MOUTH DAILY WITH BREAKFAST., Disp: 90 tablet, Rfl: 3 .  ergocalciferol (VITAMIN D2) 1.25 MG (50000 UT) capsule, Take 50,000 Units by mouth once a week. Sunday, Disp: , Rfl:  .  gabapentin (NEURONTIN) 300 MG capsule, Take 300 mg by mouth 3 (three) times daily. , Disp: , Rfl:  .  insulin aspart (NOVOLOG) 100 UNIT/ML injection, Inject 8-12 Units into the skin See admin instructions. Per sliding scale Under 150 no units 150-199 take 8 units 200-249 take 10 units 250-299 take 12 units, Disp: , Rfl:  .  isosorbide mononitrate (IMDUR) 30 MG 24 hr tablet, TAKE 0.5 TABLETS (15 MG TOTAL) BY MOUTH DAILY., Disp: 45 tablet, Rfl: 1 .  Lancets (ONETOUCH DELICA PLUS [id number]) MISC, USE TO CHECK BLOOD SUGARS 3 TIMES A DAY (NEED PART B CARD), Disp: , Rfl:  .  metFORMIN (GLUCOPHAGE-XR) 500 MG 24 hr tablet, Take 500 mg by mouth in the morning and at bedtime. , Disp: , Rfl:  .  NARCAN 4 MG/0.1ML LIQD nasal spray kit, Place 0.4 mg into the nose once. , Disp: , Rfl:  .  nitroGLYCERIN (NITROSTAT) 0.4 MG SL  tablet, PLACE 1 TABLET UNDER THE TONGUE EVERY 5 MINUTES AS NEEDED FOR CHEST PAIN (UP TO 3 DOSES/15MIN) (Patient taking differently: Place 0.4 mg under the tongue every 5 (five) minutes as needed for chest pain.), Disp: 75 tablet, Rfl: 2 .  ondansetron (ZOFRAN) 4 MG tablet, Take 4 mg by mouth every 8 (eight) hours as needed for nausea or vomiting. , Disp: , Rfl:  .  ONETOUCH VERIO test strip, USE TO CHECK BLOOD SUGAR 3 TIMES A DAY (NEED PART B CARD), Disp: , Rfl:  .  oxyCODONE-acetaminophen (PERCOCET) 10-325 MG per tablet, Take 1 tablet by mouth 5 (five) times daily. , Disp: , Rfl:  .  pantoprazole (PROTONIX) 40 MG tablet, Take 1 tablet (40 mg total) by mouth daily., Disp: 30 tablet, Rfl: 11 .  rosuvastatin (CRESTOR) 40 MG tablet, TAKE 1 TABLET (40 MG TOTAL) EVERY EVENING BY MOUTH. (Patient taking differently: Take 40 mg by mouth at bedtime.), Disp: 90 tablet, Rfl: 3 .  Semaglutide 1 MG/DOSE SOPN, Inject 1 mg into the skin every Sunday. "Ozempic", Disp: , Rfl:  .  TRESIBA FLEXTOUCH 100 UNIT/ML SOPN FlexTouch Pen, Inject 60 Units as directed at bedtime. , Disp: , Rfl: 4 .  zolpidem (AMBIEN) 5 MG tablet, TAKE 1 TABLET BY MOUTH EVERY DAY AT BEDTIME AS NEEDED FOR SLEEP (Patient taking differently: Take 5 mg by mouth at bedtime. TAKE 1 TABLET BY MOUTH  EVERY DAY AT BEDTIME AS NEEDED FOR SLEE), Disp: 30 tablet, Rfl: 5  Past Medical History: Past Medical History:  Diagnosis Date  . Chronic diastolic CHF (congestive heart failure) ([place])   . Coronary artery disease    a. 4v CABG  (LIMA to LAD, SVG to subbranch of ramus intermediate, SVG to lateral subbranch of ramus intermediate, SVG to distal RCA) and mitral valve repair January 2011. b. Abnormal nuc 09/2016 s/p  drug eluting stent to the vein graft to the PDA, a drug eluting stent in the native Ramus intermediate branch and a drug eluting stent in the protected left main. c. DES to ramus intermediate 02/2017.   . Diabetes mellitus   . H/O emotional problems    . Headache, migraine   . High cholesterol   . History of blood transfusion   . History of nuclear stress test    Myoview 07/2019: EF 60, normal perfusion, low risk.  [place] Hypertension   . MI, old   . MRSA (methicillin resistant Staphylococcus aureus)   . MVP (mitral valve prolapse)   . Neuromuscular disorder ([place])   . Neuropathy   . S/P mitral valve repair   . Sebaceous cyst     Tobacco Use: Social History   Tobacco Use  Smoking Status Former Smoker  . Packs/day: 1.00  . Years: 28.00  . Pack years: 28.00  . Types: Cigarettes  . Quit date: 03/31/2009  . Years since quitting: 11.1  Smokeless Tobacco Never Used    Labs: Recent Chemical engineer    Labs for ITP Cardiac and Pulmonary Rehab Latest Ref Rng & Units 05/28/2009 09/02/2009 04/12/2017 11/14/2018 01/26/2020   Cholestrol 100 - 199 mg/dL - - 126 148 136   LDLCALC 0 - 99 mg/dL - - 61 55 68   HDL >39 mg/dL - - 32(L) 36(L) 35(L)   Trlycerides 0 - 149 mg/dL - - 164(H) 285(H) 200(H)   Hemoglobin A1c % - 6.5 - - -   PHART 7.350 - 7.400 7.454(H) - - - -   PCO2ART 35.0 - 45.0 mmHg 33.9(L) - - - -   HCO3 20.0 - 24.0 mEq/L 23.4 - - - -   TCO2 0 - 100 mmol/L 24.5 - - - -   ACIDBASEDEF 0.0 - 2.0 mmol/L 0.0 - - - -   O2SAT % 97.9 - - - -      Capillary Blood Glucose: Lab Results  Component Value Date   GLUCAP 83 04/16/2020   GLUCAP 93 04/14/2020   GLUCAP 155 (H) 04/14/2020   GLUCAP 116 (H) 04/09/2020   GLUCAP 149 (H) 04/09/2020     Exercise Target Goals: Exercise Program Goal: Individual exercise prescription set using results from initial 6 min walk test and THRR while considering  patient's activity barriers and safety.   Exercise Prescription Goal: Starting with aerobic activity 30 plus minutes a day, 3 days per week for initial exercise prescription. Provide home exercise prescription and guidelines that participant acknowledges understanding prior to discharge.  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 HR 85 bpm     Max Ex. BP 156/80     2 Minute Post BP 142/84            Oxygen Initial Assessment:   Oxygen Re-Evaluation:   Oxygen Discharge (Final Oxygen Re-Evaluation):   Initial Exercise Prescription:  Initial Exercise Prescription - 03/30/20 1500      Date of Initial Exercise RX and Referring Provider   Date 03/30/20    Referring Provider Delorise Royals MD    Expected Discharge Date 05/28/20      Treadmill   MPH 2.7    Grade 1    Minutes 15    METs 3.07      NuStep   Level 2    SPM 85    Minutes 15    METs 2.2      Prescription Details   Frequency (times per week) 3    Duration Progress to 30 minutes of continuous aerobic without signs/symptoms of physical distress      Intensity   THRR 40-80% of Max Heartrate 68-135    Ratings of Perceived Exertion 11-13    Perceived Dyspnea 0-4      Progression   Progression Continue progressive overload as per policy without signs/symptoms or physical distress.      Resistance Training   Training Prescription Yes    Weight 3lbs    Reps 10-15           Perform Capillary Blood Glucose checks as needed.  Exercise Prescription Changes:  Exercise Prescription Changes    Row Name 04/09/20 1400 04/28/20 1456            Response to Exercise   Blood Pressure (Admit) 112/70 130/70      Blood Pressure (Exercise) 140/80 144/80      Blood Pressure (Exit) 120/66 134/70      Heart Rate (Admit) 74 bpm 76 bpm      Heart Rate (Exercise) 86 bpm 105 bpm      Heart Rate (Exit) 66 bpm 81 bpm      Rating of Perceived Exertion (Exercise) 12 12      Symptoms None None      Comments Pt's first day of exercise in the CRP2 program Reviewed METs and  Home exercise Rx      Duration Progress to 30 minutes of  aerobic without signs/symptoms of physical distress Continue with 30 min of aerobic exercise without signs/symptoms of physical distress.      Intensity THRR unchanged THRR unchanged             Progression   Progression Continue to progress workloads to maintain intensity without signs/symptoms of physical distress. Continue to progress workloads to maintain intensity without signs/symptoms of physical distress.      Average METs 2.74 2.8  Resistance Training   Training Prescription Yes No      Weight 3lbs --      Reps 10-15 --      Time 10 Minutes --             Interval Training   Interval Training No No             Treadmill   MPH 2.4 2.7      Grade 1 1      Minutes 15 15      METs 3.17 3.44             NuStep   Level 2 2      SPM 95 90      Minutes 15 15      METs 2.3 2.1             Home Exercise Plan   Plans to continue exercise at -- Home (comment)      Frequency -- Add 2 additional days to program exercise sessions.      Initial Home Exercises Provided -- 04/28/20             Exercise Comments:  Exercise Comments    Row Name 04/09/20 1447 04/28/20 1601 05/04/20 1401       Exercise Comments Pt's first day of exercise in the CRP2 program. Pt tolerated session well. Pt will exercise at home on her own by walking 2-3 x/wek for 30-45 minutes. Pt verbalized understanding of the home exercise Rx and was provided a copy. --            Exercise Goals and Review:   Exercise Goals    Row Name 03/30/20 1501             Exercise Goals   Increase Physical Activity Yes       Intervention Provide advice, education, support and counseling about physical activity/exercise needs.;Develop an individualized exercise prescription for aerobic and resistive training based on initial evaluation findings, risk stratification, comorbidities and participant's personal goals.       Expected Outcomes Short Term: Attend rehab on a regular basis to increase amount of physical activity.;Long Term: Add in home exercise to make exercise part of routine and to increase amount of physical activity.;Long Term: Exercising regularly at least 3-5 days a week.       Increase Strength and Stamina Yes       Intervention Provide advice, education, support and counseling about physical activity/exercise needs.;Develop an individualized exercise prescription for aerobic and resistive training based on initial evaluation findings, risk stratification, comorbidities and participant's personal goals.       Expected Outcomes Short Term: Increase workloads from initial exercise prescription for resistance, speed, and METs.;Short Term: Perform resistance training exercises routinely during rehab and add in resistance training at home;Long Term: Improve cardiorespiratory fitness, muscular endurance and strength as measured by increased METs and functional capacity (6MWT)       Able to understand and use rate of perceived exertion (RPE) scale Yes       Intervention Provide education and explanation on how to use RPE scale       Expected Outcomes Short Term: Able to use RPE daily in rehab to express subjective intensity level;Long Term:  Able to use RPE to guide intensity level when exercising independently       Knowledge and understanding of Target Heart Rate Range (THRR) Yes       Intervention Provide education and explanation of THRR  including how the numbers were predicted and where they are located  for reference       Expected Outcomes Short Term: Able to state/look up THRR;Short Term: Able to use daily as guideline for intensity in rehab;Long Term: Able to use THRR to govern intensity when exercising independently       Understanding of Exercise Prescription Yes       Intervention Provide education, explanation, and written materials on patient's individual exercise prescription       Expected Outcomes Short Term: Able to explain program exercise prescription;Long Term: Able to explain home exercise prescription to exercise independently              Exercise Goals Re-Evaluation :  Exercise Goals Re-Evaluation    Row Name 04/09/20 1444 04/28/20 1535           Exercise Goal Re-Evaluation   Exercise Goals Review Increase Physical Activity;Increase Strength and Stamina;Able to understand and use rate of perceived exertion (RPE) scale;Knowledge and understanding of Target Heart Rate Range (THRR);Understanding of Exercise Prescription Increase Physical Activity;Increase Strength and Stamina;Able to understand and use rate of perceived exertion (RPE) scale;Knowledge and understanding of Target Heart Rate Range (THRR);Able to check pulse independently;Understanding of Exercise Prescription      Comments Pt's first day of exercise in the cRP2 program. Pt understands the RPE scale, THRR, and exercise Rx. Tolerated initial session well with no comaplaints. Reviewed METs and home exerecise Rx with patient today. Pt will walk at home 2-3x/week for 30-45 minutes.      Expected Outcomes Will continue to monitor pt progress and advance exercise workloads as tolerated. Pt will exercise at home on her own on her off days from the [facility] program              Discharge Exercise Prescription (Final Exercise Prescription Changes):  Exercise Prescription Changes - 04/28/20 1456      Response to Exercise   Blood Pressure (Admit) 130/70    Blood Pressure (Exercise) 144/80    Blood Pressure (Exit)  134/70    Heart Rate (Admit) 76 bpm    Heart Rate (Exercise) 105 bpm    Heart Rate (Exit) 81 bpm    Rating of Perceived Exertion (Exercise) 12    Symptoms None    Comments Reviewed METs and  Home exercise Rx    Duration Continue with 30 min of aerobic exercise without signs/symptoms of physical distress.    Intensity THRR unchanged      Progression   Progression Continue to progress workloads to maintain intensity without signs/symptoms of physical distress.    Average METs 2.8      Resistance Training   Training Prescription No      Interval Training   Interval Training No      Treadmill   MPH 2.7    Grade 1    Minutes 15    METs 3.44      NuStep   Level 2    SPM 90    Minutes 15    METs 2.1      Home Exercise Plan   Plans to continue exercise at Home (comment)    Frequency Add 2 additional days to program exercise sessions.    Initial Home Exercises Provided 04/28/20           Nutrition:  Target Goals: Understanding of nutrition guidelines, daily intake of sodium <1575m, cholesterol <2062m calories 30% from fat and 7% or less from saturated fats, daily to have  5 or more servings of fruits and vegetables.  Biometrics:  Pre Biometrics - 03/30/20 1400      Pre Biometrics   Height 5' 4.5" (1.638 m)    Weight 95.7 kg    Waist Circumference 48 inches    Hip Circumference 47 inches    Waist to Hip Ratio 1.02 %    BMI (Calculated) 35.67    Triceps Skinfold 30 mm    % Body Fat 46.9 %    Grip Strength 37 kg    Flexibility 10.75 in    Single Leg Stand 17.25 seconds            Nutrition Therapy Plan and Nutrition Goals:  Nutrition Therapy & Goals - 05/10/20 1452      Nutrition Therapy   Diet TLC; carb modified      Personal Nutrition Goals   Nutrition Goal Pt to build a healthy plate including vegetables, fruits, whole grains, and low-fat dairy products in a heart healthy meal plan.    Personal Goal #2 CBG concentrations in the normal range or as close  to normal as is safely possible.    Personal Goal #3 Pt to take insulin pen with her when she leaves the house to avoid missed doses      Intervention Plan   Intervention Prescribe, educate and counsel regarding individualized specific dietary modifications aiming towards targeted core components such as weight, hypertension, lipid management, diabetes, heart failure and other comorbidities.;Nutrition handout(s) given to patient.    Expected Outcomes Short Term Goal: A plan has been developed with personal nutrition goals set during dietitian appointment.;Long Term Goal: Adherence to prescribed nutrition plan.           Nutrition Assessments:  MEDIFICTS Score Key:  ?70 Need to make dietary changes   40-70 Heart Healthy Diet  ? 40 Therapeutic Level Cholesterol Diet  Flowsheet Row CARDIAC REHAB PHASE II EXERCISE from 04/14/2020 in [facility]  Picture Your Plate Total Score on Admission 69     Picture Your Plate Scores:  <25 Unhealthy dietary pattern with much room for improvement.  41-50 Dietary pattern unlikely to meet recommendations for good health and room for improvement.  51-60 More healthful dietary pattern, with some room for improvement.   >60 Healthy dietary pattern, although there may be some specific behaviors that could be improved.    Nutrition Goals Re-Evaluation:  Nutrition Goals Re-Evaluation    [place] Name 05/10/20 1453             Goals   Current Weight 209 lb 3.5 oz (94.9 kg)       Nutrition Goal Pt to build a healthy plate including vegetables, fruits, whole grains, and low-fat dairy products in a heart healthy meal plan.       Expected Outcome CBGs WNL/reduced time in hyperglycemia               Personal Goal #2 Re-Evaluation   Personal Goal #2 CBG concentrations in the normal range or as close to normal as is safely possible.               Personal Goal #3 Re-Evaluation   Personal Goal #3 Pt to take insulin pen  with her when she leaves the house to avoid missed doses              Nutrition Goals Discharge (Final Nutrition Goals Re-Evaluation):  Nutrition Goals Re-Evaluation - 05/10/20 1453      Goals  Current Weight 209 lb 3.5 oz (94.9 kg)    Nutrition Goal Pt to build a healthy plate including vegetables, fruits, whole grains, and low-fat dairy products in a heart healthy meal plan.    Expected Outcome CBGs WNL/reduced time in hyperglycemia      Personal Goal #2 Re-Evaluation   Personal Goal #2 CBG concentrations in the normal range or as close to normal as is safely possible.      Personal Goal #3 Re-Evaluation   Personal Goal #3 Pt to take insulin pen with her when she leaves the house to avoid missed doses           Psychosocial: Target Goals: Acknowledge presence or absence of significant depression and/or stress, maximize coping skills, provide positive support system. Participant is able to verbalize types and ability to use techniques and skills needed for reducing stress and depression.  Initial Review & Psychosocial Screening:  Initial Psych Review & Screening - 03/30/20 1359      Initial Review   Current issues with None Identified      Family Dynamics   Good Support System? Yes    Comments Elizabeth Park presents to her cardiac rehab orientation with a positive attitude and outlook. This is her 3rd time attending cardiac rehab and she is thankful for the opportunity. She admits to a strong support system including her fiance and 63 year old son of the home. They are making lifestyle changes with her. She loves doing crafts. States she has no stress in her life. No psychosocial barriers to self health management or participation in cardiac rehab identified. No interventions needed.      Barriers   Psychosocial barriers to participate in program There are no identifiable barriers or psychosocial needs.      Screening Interventions   Interventions Encouraged to exercise;Provide  feedback about the scores to participant           Quality of Life Scores:  Quality of Life - 03/30/20 1451      Quality of Life   Select Quality of Life      Quality of Life Scores   Health/Function Pre 28.4 %    Socioeconomic Pre 30 %    Psych/Spiritual Pre 30 %    Family Pre 28.8 %    GLOBAL Pre 29.14 %          Scores of 19 and below usually indicate a poorer quality of life in these areas.  A difference of  2-3 points is a clinically meaningful difference.  A difference of 2-3 points in the total score of the Quality of Life Index has been associated with significant improvement in overall quality of life, self-image, physical symptoms, and general health in studies assessing change in quality of life.  PHQ-9: Recent Review Flowsheet Data    Depression screen [facility] 2/9 03/30/2020 07/04/2017 07/04/2017 04/25/2017 04/10/2017   Decreased Interest 0 0 0 0 0   Down, Depressed, Hopeless 0 0 0 0 0    PHQ - 2 Score 0 0 0 0 0     Interpretation of Total Score  Total Score Depression Severity:  1-4 = Minimal depression, 5-9 = Mild depression, 10-14 = Moderate depression, 15-19 = Moderately severe depression, 20-27 = Severe depression   Psychosocial Evaluation and Intervention:   Psychosocial Re-Evaluation:  Psychosocial Re-Evaluation    [place] Name 04/09/20 1610 05/11/20 1431           Psychosocial Re-Evaluation   Current issues with None  Identified None Identified      Comments -- Elizabeth Park has not voiced any increased concerns or stressors      Expected Outcomes -- pt will exhibit positive outlook with good coping skills.       Interventions Encouraged to attend Cardiac Rehabilitation for the exercise;Stress management education Encouraged to attend Cardiac Rehabilitation for the exercise;Stress management education      Continue Psychosocial Services  No Follow up required No Follow up required             Psychosocial Discharge (Final Psychosocial Re-Evaluation):  Psychosocial  Re-Evaluation - 05/11/20 1431      Psychosocial Re-Evaluation   Current issues with None Identified    Comments Elizabeth Park has not voiced any increased concerns or stressors    Expected Outcomes pt will exhibit positive outlook with good coping skills.     Interventions Encouraged to attend Cardiac Rehabilitation for the exercise;Stress management education    Continue Psychosocial Services  No Follow up required           Vocational Rehabilitation: Provide vocational rehab assistance to qualifying candidates.   Vocational Rehab Evaluation & Intervention:  Vocational Rehab - 03/30/20 1359      Initial Vocational Rehab Evaluation & Intervention   Assessment shows need for Vocational Rehabilitation No      Vocational Rehab Re-Evaulation   Comments Patient is disabled and has not worked in "years"           Education: Education Goals: Education classes will be provided on a weekly basis, covering required topics. Participant will state understanding/return demonstration of topics presented.  Learning Barriers/Preferences:  Learning Barriers/Preferences - 03/30/20 1456      Learning Barriers/Preferences   Learning Barriers Sight   wears glasses   Learning Preferences Group Instruction;Individual Instruction;Pictoral;Skilled Demonstration;Computer/Internet           Education Topics: Hypertension, Hypertension Reduction -Define heart disease and high blood pressure. Discus how high blood pressure affects the body and ways to reduce high blood pressure.   Exercise and Your Heart -Discuss why it is important to exercise, the FITT principles of exercise, normal and abnormal responses to exercise, and how to exercise safely.   Angina -Discuss definition of angina, causes of angina, treatment of angina, and how to decrease risk of having angina.   Cardiac Medications -Review what the following cardiac medications are used for, how they affect the body, and side effects that  may occur when taking the medications.  Medications include Aspirin, Beta blockers, calcium channel blockers, ACE Inhibitors, angiotensin receptor blockers, diuretics, digoxin, and antihyperlipidemics.   Congestive Heart Failure -Discuss the definition of CHF, how to live with CHF, the signs and symptoms of CHF, and how keep track of weight and sodium intake.   Heart Disease and Intimacy -Discus the effect sexual activity has on the heart, how changes occur during intimacy as we age, and safety during sexual activity.   Smoking Cessation / COPD -Discuss different methods to quit smoking, the health benefits of quitting smoking, and the definition of COPD.   Nutrition I: Fats -Discuss the types of cholesterol, what cholesterol does to the heart, and how cholesterol levels can be controlled.   Nutrition II: Labels -Discuss the different components of food labels and how to read food label   Heart Parts/Heart Disease and PAD -Discuss the anatomy of the heart, the pathway of blood circulation through the heart, and these are affected by heart disease.   Stress I: Signs and Symptoms -  Discuss the causes of stress, how stress may lead to anxiety and depression, and ways to limit stress.   Stress II: Relaxation -Discuss different types of relaxation techniques to limit stress.   Warning Signs of Stroke / TIA -Discuss definition of a stroke, what the signs and symptoms are of a stroke, and how to identify when someone is having stroke.   Knowledge Questionnaire Score:  Knowledge Questionnaire Score - 03/30/20 1452      Knowledge Questionnaire Score   Pre Score 21/24           Core Components/Risk Factors/Patient Goals at Admission:  Personal Goals and Risk Factors at Admission - 03/30/20 1452      Core Components/Risk Factors/Patient Goals on Admission    Weight Management Yes;Obesity;Weight Loss    Intervention Weight Management: Develop a combined nutrition and exercise  program designed to reach desired caloric intake, while maintaining appropriate intake of nutrient and fiber, sodium and fats, and appropriate energy expenditure required for the weight goal.;Weight Management: Provide education and appropriate resources to help participant work on and attain dietary goals.;Weight Management/Obesity: Establish reasonable short term and long term weight goals.;Obesity: Provide education and appropriate resources to help participant work on and attain dietary goals.    Admit Weight 210 lb 15.7 oz (95.7 kg)    Goal Weight: Long Term 175 lb (79.4 kg)   Patient goal   Expected Outcomes Short Term: Continue to assess and modify interventions until short term weight is achieved;Long Term: Adherence to nutrition and physical activity/exercise program aimed toward attainment of established weight goal;Weight Maintenance: Understanding of the daily nutrition guidelines, which includes 25-35% calories from fat, 7% or less cal from saturated fats, less than 252m cholesterol, less than 1.5gm of sodium, & 5 or more servings of fruits and vegetables daily;Weight Loss: Understanding of general recommendations for a balanced deficit meal plan, which promotes 1-2 lb weight loss per week and includes a negative energy balance of [id number] kcal/d;Understanding recommendations for meals to include 15-35% energy as protein, 25-35% energy from fat, 35-60% energy from carbohydrates, less than 209mof dietary cholesterol, 20-35 gm of total fiber daily;Understanding of distribution of calorie intake throughout the day with the consumption of 4-5 meals/snacks    Diabetes Yes    Intervention Provide education about signs/symptoms and action to take for hypo/hyperglycemia.;Provide education about proper nutrition, including hydration, and aerobic/resistive exercise prescription along with prescribed medications to achieve blood glucose in normal ranges: Fasting glucose 65-99 mg/dL    Expected Outcomes  Short Term: Participant verbalizes understanding of the signs/symptoms and immediate care of hyper/hypoglycemia, proper foot care and importance of medication, aerobic/resistive exercise and nutrition plan for blood glucose control.;Long Term: Attainment of HbA1C < 7%.    Hypertension Yes    Intervention Provide education on lifestyle modifcations including regular physical activity/exercise, weight management, moderate sodium restriction and increased consumption of fresh fruit, vegetables, and low fat dairy, alcohol moderation, and smoking cessation.;Monitor prescription use compliance.    Expected Outcomes Short Term: Continued assessment and intervention until BP is < 140/9053mG in hypertensive participants. < 130/86m38m in hypertensive participants with diabetes, heart failure or chronic kidney disease.;Long Term: Maintenance of blood pressure at goal levels.    Lipids Yes    Intervention Provide education and support for participant on nutrition & aerobic/resistive exercise along with prescribed medications to achieve LDL <70mg64mL >40mg.75mExpected Outcomes Short Term: Participant states understanding of desired cholesterol values and is compliant with medications prescribed.  Participant is following exercise prescription and nutrition guidelines.;Long Term: Cholesterol controlled with medications as prescribed, with individualized exercise RX and with personalized nutrition plan. Value goals: LDL < 1m, HDL > 40 mg.           Core Components/Risk Factors/Patient Goals Review:   Goals and Risk Factor Review    Row Name 04/09/20 1611 05/11/20 1432           Core Components/Risk Factors/Patient Goals Review   Personal Goals Review Weight Management/Obesity;Hypertension;Diabetes;Lipids Weight Management/Obesity;Hypertension;Diabetes;Lipids      Review Elizabeth Park started exercise on 04/09/20 tolerated well. Vital signs and CBG's are stable today Elizabeth Park has been doing well with exercise. Elizabeth Park's vital  signs and CBG's  have been stable      Expected Outcomes Elizabeth Park will continue to participate in phase 2 cardiac rehab for exercise, nutrition and lifestyle modifications. Elizabeth Park will continue to participate in phase 2 cardiac rehab for exercise, nutrition and lifestyle modifications.             Core Components/Risk Factors/Patient Goals at Discharge (Final Review):   Goals and Risk Factor Review - 05/11/20 1432      Core Components/Risk Factors/Patient Goals Review   Personal Goals Review Weight Management/Obesity;Hypertension;Diabetes;Lipids    Review Elizabeth Park has been doing well with exercise. Elizabeth Park's vital signs and CBG's  have been stable    Expected Outcomes Elizabeth Park will continue to participate in phase 2 cardiac rehab for exercise, nutrition and lifestyle modifications.           ITP Comments:  ITP Comments    Row Name 03/30/20 1350 04/09/20 1609 05/11/20 1430       ITP Comments Dr. TFransico Him Medical Director [facility] 30 Day ITP Review. Elizabeth Park started exercise on 04/09/20 tolerated well 30 Day ITP Review. Elizabeth Park has good participation and fair attendance in phase 2 cardiac rehab. Elizabeth Park has been walking closely with the RD.            Comments: See ITP comments.MBarnet Pall RN,BSN 05/11/2020 2:35 PM

## 2020-05-12 ENCOUNTER — Other Ambulatory Visit: Payer: Self-pay

## 2020-05-12 ENCOUNTER — Encounter (HOSPITAL_COMMUNITY)
Admission: RE | Admit: 2020-05-12 | Discharge: 2020-05-12 | Disposition: A | Discharge status: Home / Self Care | Payer: Medicare HMO | Source: Ambulatory Visit | Attending: Cardiovascular Disease | Admitting: Cardiovascular Disease

## 2020-05-12 DIAGNOSIS — Z955 Presence of coronary angioplasty implant and graft: Secondary | ICD-10-CM

## 2020-05-12 DIAGNOSIS — Z48812 Encounter for surgical aftercare following surgery on the circulatory system: Secondary | ICD-10-CM | POA: Diagnosis not present

## 2020-05-14 ENCOUNTER — Other Ambulatory Visit: Payer: Self-pay

## 2020-05-14 ENCOUNTER — Encounter (HOSPITAL_COMMUNITY)
Admission: RE | Admit: 2020-05-14 | Discharge: 2020-05-14 | Disposition: A | Discharge status: Home / Self Care | Payer: Medicare HMO | Source: Ambulatory Visit | Attending: Cardiovascular Disease | Admitting: Cardiovascular Disease

## 2020-05-14 DIAGNOSIS — Z955 Presence of coronary angioplasty implant and graft: Secondary | ICD-10-CM

## 2020-05-14 DIAGNOSIS — Z48812 Encounter for surgical aftercare following surgery on the circulatory system: Secondary | ICD-10-CM | POA: Diagnosis not present

## 2020-05-14 NOTE — Progress Notes (Signed)
Nutrition Note  Pt arrive to cardiac rehab and reports low CBGs through the night since starting Saxenda 1.8 mg on Tuesday. Reviewed CGM report - CBGs in the 70's (much lower than her normal).  Pt took her Ozempic shot this past Sunday.  Reached out to her endocrinologist.  Per Dr. Brynna Dobos Pel, Rhya should decrease tresiba by 10% and wait until Sunday night to take Saxenda injection. Lyriq will resume normal tresiba dose of 60 units per day when her CBGs are >80 during the night. Will continue to monitor pt during cardiac rehab.  Michaele Offer, MS, RDN, LDN

## 2020-05-14 NOTE — Addendum Note (Signed)
Encounter addended by: George Ina, RD on: 05/14/2020 9:35 AM  Actions taken: Clinical Note Signed

## 2020-05-14 NOTE — Progress Notes (Signed)
Nutrition Note  Downloaded CGM report.   Continuous glucose monitoring download:      Average is    151  for 14 days   Time sensor is active   93 %   Time in range (70-180 mg/dL):  86 % (Goal >70%)   Time High (181-250 mg/dL)  11 % (Goal < 25%)   Time Very High (>250 mg/dl)  3 % (Goal < 5%)   Time Low (54-69 mg/dL)  0 % (Goal is <4%)   Time Very Low (<54)  0%  (Goal <1%)   Coefficient of variation  25% (Goal is <36%)      Much improved from download on Apr 28 2020. Time in range at that time was 65% and time high was 32%.  Today we collaborated on meal planning strategies. She has been carb counting for a week and matching her insulin doses with her food more often. Most recent week of CGM download shows less hyperglycemia and no hypoglycemia. We reviewed how her rapid acting and basal insulins work/pharmacodynamics.  She was using tresiba to cover her nighttime snack with resulting hyperglycemia. She has been eating 30-45 g carbs for most meals. She notices less hyperglycemia after meals on days she exercises. While this has been working, she is feeling hungry some days and would like the flexibility to increase carbs at meals. She has been working on eating 3 meals per day. We have discussed lower carb snacks.  Will continue to work on meal planning to incorporate snacks per pt preference.  Michaele Offer, MS, RDN, LDN

## 2020-05-17 ENCOUNTER — Other Ambulatory Visit: Payer: Self-pay

## 2020-05-17 ENCOUNTER — Encounter (HOSPITAL_COMMUNITY)
Admission: RE | Admit: 2020-05-17 | Discharge: 2020-05-17 | Disposition: A | Discharge status: Home / Self Care | Payer: Medicare HMO | Source: Ambulatory Visit | Attending: Cardiovascular Disease | Admitting: Cardiovascular Disease

## 2020-05-17 DIAGNOSIS — Z955 Presence of coronary angioplasty implant and graft: Secondary | ICD-10-CM

## 2020-05-17 DIAGNOSIS — Z48812 Encounter for surgical aftercare following surgery on the circulatory system: Secondary | ICD-10-CM | POA: Diagnosis not present

## 2020-05-17 NOTE — Progress Notes (Signed)
Medication changes noted and updated.Barnet Pall, RN,BSN 05/17/2020 3:10 PM

## 2020-05-19 ENCOUNTER — Encounter (HOSPITAL_COMMUNITY)
Admission: RE | Admit: 2020-05-19 | Discharge: 2020-05-19 | Disposition: A | Discharge status: Home / Self Care | Payer: Medicare HMO | Source: Ambulatory Visit | Attending: Cardiovascular Disease | Admitting: Cardiovascular Disease

## 2020-05-19 ENCOUNTER — Other Ambulatory Visit: Payer: Self-pay

## 2020-05-19 DIAGNOSIS — Z955 Presence of coronary angioplasty implant and graft: Secondary | ICD-10-CM

## 2020-05-19 DIAGNOSIS — Z48812 Encounter for surgical aftercare following surgery on the circulatory system: Secondary | ICD-10-CM | POA: Diagnosis not present

## 2020-05-21 ENCOUNTER — Encounter (HOSPITAL_COMMUNITY)
Admission: RE | Admit: 2020-05-21 | Discharge: 2020-05-21 | Disposition: A | Discharge status: Home / Self Care | Payer: Medicare HMO | Source: Ambulatory Visit | Attending: Cardiovascular Disease | Admitting: Cardiovascular Disease

## 2020-05-21 ENCOUNTER — Other Ambulatory Visit: Payer: Self-pay

## 2020-05-21 DIAGNOSIS — Z48812 Encounter for surgical aftercare following surgery on the circulatory system: Secondary | ICD-10-CM | POA: Diagnosis not present

## 2020-05-21 DIAGNOSIS — Z955 Presence of coronary angioplasty implant and graft: Secondary | ICD-10-CM

## 2020-05-24 ENCOUNTER — Other Ambulatory Visit: Payer: Self-pay | Admitting: Physician Assistant

## 2020-05-24 ENCOUNTER — Other Ambulatory Visit: Payer: Self-pay | Admitting: Cardiovascular Disease

## 2020-05-24 ENCOUNTER — Encounter (HOSPITAL_COMMUNITY): Payer: Medicare HMO

## 2020-05-26 ENCOUNTER — Other Ambulatory Visit: Payer: Self-pay

## 2020-05-26 ENCOUNTER — Encounter (HOSPITAL_COMMUNITY)
Admission: RE | Admit: 2020-05-26 | Discharge: 2020-05-26 | Disposition: A | Discharge status: Home / Self Care | Payer: Medicare HMO | Source: Ambulatory Visit | Attending: Cardiovascular Disease | Admitting: Cardiovascular Disease

## 2020-05-26 VITALS — Ht 64.5 in | Wt 208.8 lb

## 2020-05-26 DIAGNOSIS — Z48812 Encounter for surgical aftercare following surgery on the circulatory system: Secondary | ICD-10-CM | POA: Diagnosis not present

## 2020-05-26 DIAGNOSIS — Z955 Presence of coronary angioplasty implant and graft: Secondary | ICD-10-CM

## 2020-05-26 NOTE — Progress Notes (Addendum)
Discharge Progress Report  Patient Details  Name: Elizabeth Park MRN: [id number] Date of Birth: 01/02/1971 Referring Provider:   Flowsheet Row CARDIAC REHAB PHASE II ORIENTATION from 03/30/2020 in [facility]  Referring Provider Delorise Royals MD        Number of Visits: 17  Reason for Discharge:  Patient reached a stable level of exercise. Patient independent in their exercise. Patient has met program and personal goals.  Smoking History:  Social History   Tobacco Use  Smoking Status Former Smoker  . Packs/day: 1.00  . Years: 28.00  . Pack years: 28.00  . Types: Cigarettes  . Quit date: 03/31/2009  . Years since quitting: 11.1  Smokeless Tobacco Never Used    Diagnosis:  Status post coronary artery stent placement  ADL UCSD:    Initial Exercise Prescription:  Initial Exercise Prescription - 03/30/20 1500       Date of Initial Exercise RX and Referring Provider   Date 03/30/20    Referring Provider Delorise Royals MD    Expected Discharge Date 05/28/20      Treadmill   MPH 2.7    Grade 1    Minutes 15    METs 3.07      NuStep   Level 2    SPM 85    Minutes 15    METs 2.2      Prescription Details   Frequency (times per week) 3    Duration Progress to 30 minutes of continuous aerobic without signs/symptoms of physical distress      Intensity   THRR 40-80% of Max Heartrate 68-135    Ratings of Perceived Exertion 11-13    Perceived Dyspnea 0-4      Progression   Progression Continue progressive overload as per policy without signs/symptoms or physical distress.      Resistance Training   Training Prescription Yes    Weight 3lbs    Reps 10-15             Discharge Exercise Prescription (Final Exercise Prescription Changes):  Exercise Prescription Changes - 05/28/20 1500       Response to Exercise   Blood Pressure (Admit) 120/62    Blood Pressure (Exercise) 154/68    Blood Pressure (Exit)  106/64    Heart Rate (Admit) 80 bpm    Heart Rate (Exercise) 92 bpm    Heart Rate (Exit) 73 bpm    Rating of Perceived Exertion (Exercise) 12    Symptoms None    Comments Pt graduated from the [facility] program today    Duration Continue with 30 min of aerobic exercise without signs/symptoms of physical distress.    Intensity THRR unchanged      Progression   Progression Continue to progress workloads to maintain intensity without signs/symptoms of physical distress.    Average METs 3.1      Resistance Training   Training Prescription Yes    Weight 3lbs    Reps 10-15    Time 10 Minutes      Interval Training   Interval Training No      Treadmill   MPH 3    Grade 1    Minutes 15    METs 3.71      NuStep   Level 4    SPM 90    Minutes 15    METs 2.4      Home Exercise Plan   Plans to continue exercise at [facility] (comment)  Frequency Add 4 additional days to program exercise sessions.    Initial Home Exercises Provided 04/28/20             Functional Capacity:  6 Minute Walk     Row Name 03/30/20 1457 05/21/20 1315       6 Minute Walk   Phase -- Discharge    Distance 1486 feet 1638 feet    Distance % Change -- 10.23 %    Distance Feet Change -- 152 ft    Walk Time 6 minutes 6 minutes    # of Rest Breaks 0 0    MPH 2.81 3.1    METS 3.78 4.2    RPE 9 12    Perceived Dyspnea  0 0    VO2 Peak 13.26 14.71    Symptoms No No    Resting HR 74 bpm 56 bpm    Resting BP 132/90 110/64    Resting Oxygen Saturation  99 % 99 %    Exercise Oxygen Saturation  during 6 min walk 97 % 99 %    Max Ex. HR 85 bpm 95 bpm    Max Ex. BP 156/80 158/80    2 Minute Post BP 142/84 --             Psychological, QOL, Others - Outcomes: PHQ 2/9: Depression screen [facility] 2/9 05/26/2020 03/30/2020 07/04/2017 07/04/2017 04/25/2017  Decreased Interest 0 0 0 0 0  Down, Depressed, Hopeless 0 0 0 0 0  PHQ - 2 Score 0 0 0 0 0  Some recent data might be hidden    Quality of  Life:  Quality of Life - 05/19/20 1616       Quality of Life Scores   Health/Function Post 29.1 %    Socioeconomic Post 30 %    Psych/Spiritual Post 30 %    Family Post 30 %    GLOBAL Post 29.61 %             Personal Goals: Goals established at orientation with interventions provided to work toward goal.  Personal Goals and Risk Factors at Admission - 03/30/20 1452       Core Components/Risk Factors/Patient Goals on Admission    Weight Management Yes;Obesity;Weight Loss    Intervention Weight Management: Develop a combined nutrition and exercise program designed to reach desired caloric intake, while maintaining appropriate intake of nutrient and fiber, sodium and fats, and appropriate energy expenditure required for the weight goal.;Weight Management: Provide education and appropriate resources to help participant work on and attain dietary goals.;Weight Management/Obesity: Establish reasonable short term and long term weight goals.;Obesity: Provide education and appropriate resources to help participant work on and attain dietary goals.    Admit Weight 210 lb 15.7 oz (95.7 kg)    Goal Weight: Long Term 175 lb (79.4 kg)   Patient goal   Expected Outcomes Short Term: Continue to assess and modify interventions until short term weight is achieved;Long Term: Adherence to nutrition and physical activity/exercise program aimed toward attainment of established weight goal;Weight Maintenance: Understanding of the daily nutrition guidelines, which includes 25-35% calories from fat, 7% or less cal from saturated fats, less than 241m cholesterol, less than 1.5gm of sodium, & 5 or more servings of fruits and vegetables daily;Weight Loss: Understanding of general recommendations for a balanced deficit meal plan, which promotes 1-2 lb weight loss per week and includes a negative energy balance of [phone] kcal/d;Understanding recommendations for meals to include 15-35% energy as protein, 25-35%  energy from fat, 35-60% energy from carbohydrates, less than 277m of dietary cholesterol, 20-35 gm of total fiber daily;Understanding of distribution of calorie intake throughout the day with the consumption of 4-5 meals/snacks    Diabetes Yes    Intervention Provide education about signs/symptoms and action to take for hypo/hyperglycemia.;Provide education about proper nutrition, including hydration, and aerobic/resistive exercise prescription along with prescribed medications to achieve blood glucose in normal ranges: Fasting glucose 65-99 mg/dL    Expected Outcomes Short Term: Participant verbalizes understanding of the signs/symptoms and immediate care of hyper/hypoglycemia, proper foot care and importance of medication, aerobic/resistive exercise and nutrition plan for blood glucose control.;Long Term: Attainment of HbA1C < 7%.    Hypertension Yes    Intervention Provide education on lifestyle modifcations including regular physical activity/exercise, weight management, moderate sodium restriction and increased consumption of fresh fruit, vegetables, and low fat dairy, alcohol moderation, and smoking cessation.;Monitor prescription use compliance.    Expected Outcomes Short Term: Continued assessment and intervention until BP is < 140/921mHG in hypertensive participants. < 130/802mG in hypertensive participants with diabetes, heart failure or chronic kidney disease.;Long Term: Maintenance of blood pressure at goal levels.    Lipids Yes    Intervention Provide education and support for participant on nutrition & aerobic/resistive exercise along with prescribed medications to achieve LDL <21m62mDL >40mg71m Expected Outcomes Short Term: Participant states understanding of desired cholesterol values and is compliant with medications prescribed. Participant is following exercise prescription and nutrition guidelines.;Long Term: Cholesterol controlled with medications as prescribed, with individualized  exercise RX and with personalized nutrition plan. Value goals: LDL < 21mg,41m > 40 mg.              Personal Goals Discharge:  Goals and Risk Factor Review     Row Name 04/09/20 1611 05/11/20 1432 06/08/20 0853         Core Components/Risk Factors/Patient Goals Review   Personal Goals Review Weight Management/Obesity;Hypertension;Diabetes;Lipids Weight Management/Obesity;Hypertension;Diabetes;Lipids Weight Management/Obesity;Hypertension;Diabetes;Lipids     Review Kimisha started exercise on 04/09/20 tolerated well. Vital signs and CBG's are stable today Yazmeen has been doing well with exercise. Mushka's vital signs and CBG's  have been stable Jisella completed exercise on 06/08/20. Loana did well with exercise her vital signs and CBG's remained stable     Expected Outcomes Peighton will continue to participate in phase 2 cardiac rehab for exercise, nutrition and lifestyle modifications. Ryleeann will continue to participate in phase 2 cardiac rehab for exercise, nutrition and lifestyle modifications. Micaela will continue to exercise at the gym follow nutrtion and lifestyle modifications              Exercise Goals and Review:  Exercise Goals     Row Name 03/30/20 1501             Exercise Goals   Increase Physical Activity Yes       Intervention Provide advice, education, support and counseling about physical activity/exercise needs.;Develop an individualized exercise prescription for aerobic and resistive training based on initial evaluation findings, risk stratification, comorbidities and participant's personal goals.       Expected Outcomes Short Term: Attend rehab on a regular basis to increase amount of physical activity.;Long Term: Add in home exercise to make exercise part of routine and to increase amount of physical activity.;Long Term: Exercising regularly at least 3-5 days a week.       Increase Strength and Stamina Yes       Intervention Provide  advice, education, support and counseling about  physical activity/exercise needs.;Develop an individualized exercise prescription for aerobic and resistive training based on initial evaluation findings, risk stratification, comorbidities and participant's personal goals.       Expected Outcomes Short Term: Increase workloads from initial exercise prescription for resistance, speed, and METs.;Short Term: Perform resistance training exercises routinely during rehab and add in resistance training at home;Long Term: Improve cardiorespiratory fitness, muscular endurance and strength as measured by increased METs and functional capacity (6MWT)       Able to understand and use rate of perceived exertion (RPE) scale Yes       Intervention Provide education and explanation on how to use RPE scale       Expected Outcomes Short Term: Able to use RPE daily in rehab to express subjective intensity level;Long Term:  Able to use RPE to guide intensity level when exercising independently       Knowledge and understanding of Target Heart Rate Range (THRR) Yes       Intervention Provide education and explanation of THRR including how the numbers were predicted and where they are located for reference       Expected Outcomes Short Term: Able to state/look up THRR;Short Term: Able to use daily as guideline for intensity in rehab;Long Term: Able to use THRR to govern intensity when exercising independently       Understanding of Exercise Prescription Yes       Intervention Provide education, explanation, and written materials on patient's individual exercise prescription       Expected Outcomes Short Term: Able to explain program exercise prescription;Long Term: Able to explain home exercise prescription to exercise independently                Exercise Goals Re-Evaluation:  Exercise Goals Re-Evaluation     Row Name 04/09/20 1444 04/28/20 1535 05/28/20 1500         Exercise Goal Re-Evaluation   Exercise Goals Review Increase Physical Activity;Increase  Strength and Stamina;Able to understand and use rate of perceived exertion (RPE) scale;Knowledge and understanding of Target Heart Rate Range (THRR);Understanding of Exercise Prescription Increase Physical Activity;Increase Strength and Stamina;Able to understand and use rate of perceived exertion (RPE) scale;Knowledge and understanding of Target Heart Rate Range (THRR);Able to check pulse independently;Understanding of Exercise Prescription Increase Physical Activity;Increase Strength and Stamina;Able to understand and use rate of perceived exertion (RPE) scale;Knowledge and understanding of Target Heart Rate Range (THRR);Able to check pulse independently;Understanding of Exercise Prescription     Comments Pt's first day of exercise in the [facility] program. Pt understands the RPE scale, THRR, and exercise Rx. Tolerated initial session well with no comaplaints. Reviewed METs and home exerecise Rx with patient today. Pt will walk at home 2-3x/week for 30-45 minutes. Pt graduated from the [facility] program. Pt progressed well throught the program and had an average MET level of 3.1. Pt plans to continue her exercise at [employer] 5-7x/week for 30-45 minutes.     Expected Outcomes Will continue to monitor pt progress and advance exercise workloads as tolerated. Pt will exercise at home on her own on her off days from the [place] program Pt will continue to exercise on her own at home.              Nutrition & Weight - Outcomes:  Pre Biometrics - 03/30/20 1400       Pre Biometrics   Height 5' 4.5" (1.638 m)    Weight 95.7 kg  Waist Circumference 48 inches    Hip Circumference 47 inches    Waist to Hip Ratio 1.02 %    BMI (Calculated) 35.67    Triceps Skinfold 30 mm    % Body Fat 46.9 %    Grip Strength 37 kg    Flexibility 10.75 in    Single Leg Stand 17.25 seconds             Post Biometrics - 05/26/20 1315        Post  Biometrics   Height 5' 4.5" (1.638 m)    Weight 94.7 kg    Waist  Circumference 48 inches    Hip Circumference 46.75 inches    Waist to Hip Ratio 1.03 %    BMI (Calculated) 35.3    Triceps Skinfold 29 mm    % Body Fat 46.6 %    Grip Strength 39 kg    Flexibility 11.75 in    Single Leg Stand 10.75 seconds             Nutrition:  Nutrition Therapy & Goals - 05/10/20 1452       Nutrition Therapy   Diet TLC; carb modified      Personal Nutrition Goals   Nutrition Goal Pt to build a healthy plate including vegetables, fruits, whole grains, and low-fat dairy products in a heart healthy meal plan.    Personal Goal #2 CBG concentrations in the normal range or as close to normal as is safely possible.    Personal Goal #3 Pt to take insulin pen with her when she leaves the house to avoid missed doses      Intervention Plan   Intervention Prescribe, educate and counsel regarding individualized specific dietary modifications aiming towards targeted core components such as weight, hypertension, lipid management, diabetes, heart failure and other comorbidities.;Nutrition handout(s) given to patient.    Expected Outcomes Short Term Goal: A plan has been developed with personal nutrition goals set during dietitian appointment.;Long Term Goal: Adherence to prescribed nutrition plan.             Nutrition Discharge:    Education Questionnaire Score:  Knowledge Questionnaire Score - 05/19/20 1617       Knowledge Questionnaire Score   Post Score 24/24             Goals reviewed with patient; copy given to patient.Kanylah graduated from cardiac rehab program on 05/26/20 with completion of 17 exercise sessions in Phase II. Pt maintained good attendance and progressed nicely during his participation in rehab as evidenced by increased MET level.   Medication list reconciled. Repeat  PHQ score- 0 .  Pt has made significant lifestyle changes and should be commended for her success. Pt feels she has achieved her goals during cardiac rehab.   Pt plans to  continue exercise by attending [employer]. Desta increased her distance on her post exercise walk test by 152 feet.We are proud of Millena's progress! Barnet Pall, RN,BSN 06/08/2020 8:58 AM

## 2020-05-26 NOTE — Progress Notes (Incomplete Revision)
Discharge Progress Report  Patient Details  Name: Elizabeth Park MRN: [id number] Date of Birth: 02/22/1971 Referring Provider:   Flowsheet Row CARDIAC REHAB PHASE II ORIENTATION from 03/30/2020 in [facility]  Referring Provider McAlany, Christopher MD       Number of Visits: 17  Reason for Discharge:  Patient reached a stable level of exercise. Patient independent in their exercise. Patient has met program and personal goals.  Smoking History:  Social History   Tobacco Use  Smoking Status Former Smoker  . Packs/day: 1.00  . Years: 28.00  . Pack years: 28.00  . Types: Cigarettes  . Quit date: 03/31/2009  . Years since quitting: 11.1  Smokeless Tobacco Never Used    Diagnosis:  Status post coronary artery stent placement  ADL UCSD:   Initial Exercise Prescription:  Initial Exercise Prescription - 03/30/20 1500      Date of Initial Exercise RX and Referring Provider   Date 03/30/20    Referring Provider McAlany, Christopher MD    Expected Discharge Date 05/28/20      Treadmill   MPH 2.7    Grade 1    Minutes 15    METs 3.07      NuStep   Level 2    SPM 85    Minutes 15    METs 2.2      Prescription Details   Frequency (times per week) 3    Duration Progress to 30 minutes of continuous aerobic without signs/symptoms of physical distress      Intensity   THRR 40-80% of Max Heartrate 68-135    Ratings of Perceived Exertion 11-13    Perceived Dyspnea 0-4      Progression   Progression Continue progressive overload as per policy without signs/symptoms or physical distress.      Resistance Training   Training Prescription Yes    Weight 3lbs    Reps 10-15           Discharge Exercise Prescription (Final Exercise Prescription Changes):  Exercise Prescription Changes - 05/28/20 1500      Response to Exercise   Blood Pressure (Admit) 120/62    Blood Pressure (Exercise) 154/68    Blood Pressure (Exit) 106/64    Heart  Rate (Admit) 80 bpm    Heart Rate (Exercise) 92 bpm    Heart Rate (Exit) 73 bpm    Rating of Perceived Exertion (Exercise) 12    Symptoms None    Comments Pt graduated from the [facility] program today    Duration Continue with 30 min of aerobic exercise without signs/symptoms of physical distress.    Intensity THRR unchanged      Progression   Progression Continue to progress workloads to maintain intensity without signs/symptoms of physical distress.    Average METs 3.1      Resistance Training   Training Prescription Yes    Weight 3lbs    Reps 10-15    Time 10 Minutes      Interval Training   Interval Training No      Treadmill   MPH 3    Grade 1    Minutes 15    METs 3.71      NuStep   Level 4    SPM 90    Minutes 15    METs 2.4      Home Exercise Plan   [facility]    Frequency Add 4 additional days   to program exercise sessions.    Initial Home Exercises Provided 04/28/20           Functional Capacity:  6 Minute Walk    Row Name 03/30/20 1457 05/21/20 1315       6 Minute Walk   Phase - Discharge    Distance 1486 feet 1638 feet    Distance % Change - 10.23 %    Distance Feet Change - 152 ft    Walk Time 6 minutes 6 minutes    # of Rest Breaks 0 0    MPH 2.81 3.1    METS 3.78 4.2    RPE 9 12    Perceived Dyspnea  0 0    VO2 Peak 13.26 14.71    Symptoms No No    Resting HR 74 bpm 56 bpm    Resting BP 132/90 110/64    Resting Oxygen Saturation  99 % 99 %    Exercise Oxygen Saturation  during 6 min walk 97 % 99 %    Max Ex. HR 85 bpm 95 bpm    Max Ex. BP 156/80 158/80    2 Minute Post BP 142/84 -           Psychological, QOL, Others - Outcomes: PHQ 2/9: Depression screen PHQ 2/9 05/26/2020 03/30/2020 07/04/2017 07/04/2017 04/25/2017  Decreased Interest 0 0 0 0 0  Down, Depressed, Hopeless 0 0 0 0 0  PHQ - 2 Score 0 0 0 0 0  Some recent data might be hidden    Quality of Life:  Quality of Life -  05/19/20 1616      Quality of Life Scores   Health/Function Post 29.1 %    Socioeconomic Post 30 %    Psych/Spiritual Post 30 %    Family Post 30 %    GLOBAL Post 29.61 %           Personal Goals: Goals established at orientation with interventions provided to work toward goal.  Personal Goals and Risk Factors at Admission - 03/30/20 1452      Core Components/Risk Factors/Patient Goals on Admission    Weight Management Yes;Obesity;Weight Loss    Intervention Weight Management: Develop a combined nutrition and exercise program designed to reach desired caloric intake, while maintaining appropriate intake of nutrient and fiber, sodium and fats, and appropriate energy expenditure required for the weight goal.;Weight Management: Provide education and appropriate resources to help participant work on and attain dietary goals.;Weight Management/Obesity: Establish reasonable short term and long term weight goals.;Obesity: Provide education and appropriate resources to help participant work on and attain dietary goals.    Admit Weight 210 lb 15.7 oz (95.7 kg)    Goal Weight: Long Term 175 lb (79.4 kg)   Patient goal   Expected Outcomes Short Term: Continue to assess and modify interventions until short term weight is achieved;Long Term: Adherence to nutrition and physical activity/exercise program aimed toward attainment of established weight goal;Weight Maintenance: Understanding of the daily nutrition guidelines, which includes 25-35% calories from fat, 7% or less cal from saturated fats, less than 200mg cholesterol, less than 1.5gm of sodium, & 5 or more servings of fruits and vegetables daily;Weight Loss: Understanding of general recommendations for a balanced deficit meal plan, which promotes 1-2 lb weight loss per week and includes a negative energy balance of 500-1000 kcal/d;Understanding recommendations for meals to include 15-35% energy as protein, 25-35% energy from fat, 35-60% energy from  carbohydrates, less than 200mg of dietary cholesterol,   20-35 gm of total fiber daily;Understanding of distribution of calorie intake throughout the day with the consumption of 4-5 meals/snacks    Diabetes Yes    Intervention Provide education about signs/symptoms and action to take for hypo/hyperglycemia.;Provide education about proper nutrition, including hydration, and aerobic/resistive exercise prescription along with prescribed medications to achieve blood glucose in normal ranges: Fasting glucose 65-99 mg/dL    Expected Outcomes Short Term: Participant verbalizes understanding of the signs/symptoms and immediate care of hyper/hypoglycemia, proper foot care and importance of medication, aerobic/resistive exercise and nutrition plan for blood glucose control.;Long Term: Attainment of HbA1C < 7%.    Hypertension Yes    Intervention Provide education on lifestyle modifcations including regular physical activity/exercise, weight management, moderate sodium restriction and increased consumption of fresh fruit, vegetables, and low fat dairy, alcohol moderation, and smoking cessation.;Monitor prescription use compliance.    Expected Outcomes Short Term: Continued assessment and intervention until BP is < 140/34m HG in hypertensive participants. < 130/831mHG in hypertensive participants with diabetes, heart failure or chronic kidney disease.;Long Term: Maintenance of blood pressure at goal levels.    Lipids Yes    Intervention Provide education and support for participant on nutrition & aerobic/resistive exercise along with prescribed medications to achieve LDL <7022mHDL >24m53m  Expected Outcomes Short Term: Participant states understanding of desired cholesterol values and is compliant with medications prescribed. Participant is following exercise prescription and nutrition guidelines.;Long Term: Cholesterol controlled with medications as prescribed, with individualized exercise RX and with personalized  nutrition plan. Value goals: LDL < 70mg10mL > 40 mg.            Personal Goals Discharge:  Goals and Risk Factor Review    Row Name 04/09/20 1611 05/11/20 1432 06/08/20 0853         Core Components/Risk Factors/Patient Goals Review   Personal Goals Review Weight Management/Obesity;Hypertension;Diabetes;Lipids Weight Management/Obesity;Hypertension;Diabetes;Lipids Weight Management/Obesity;Hypertension;Diabetes;Lipids     Review Laria started exercise on 04/09/20 tolerated well. Vital signs and CBG's are stable today Elya has been doing well with exercise. Quanika's vital signs and CBG's  have been stable Oline completed exercise on 06/08/20. Ammanda did well with exercise her vital signs and CBG's remained stable     Expected Outcomes Gessica will continue to participate in phase 2 cardiac rehab for exercise, nutrition and lifestyle modifications. Ketzia will continue to participate in phase 2 cardiac rehab for exercise, nutrition and lifestyle modifications. Saryn will continue to exercise at the gym follow nutrtion and lifestyle modifications            Exercise Goals and Review:  Exercise Goals    Row Name 03/30/20 1501             Exercise Goals   Increase Physical Activity Yes       Intervention Provide advice, education, support and counseling about physical activity/exercise needs.;Develop an individualized exercise prescription for aerobic and resistive training based on initial evaluation findings, risk stratification, comorbidities and participant's personal goals.       Expected Outcomes Short Term: Attend rehab on a regular basis to increase amount of physical activity.;Long Term: Add in home exercise to make exercise part of routine and to increase amount of physical activity.;Long Term: Exercising regularly at least 3-5 days a week.       Increase Strength and Stamina Yes       Intervention Provide advice, education, support and counseling about physical activity/exercise needs.;Develop an  individualized exercise prescription for aerobic and resistive training  based on initial evaluation findings, risk stratification, comorbidities and participant's personal goals.       Expected Outcomes Short Term: Increase workloads from initial exercise prescription for resistance, speed, and METs.;Short Term: Perform resistance training exercises routinely during rehab and add in resistance training at home;Long Term: Improve cardiorespiratory fitness, muscular endurance and strength as measured by increased METs and functional capacity (6MWT)       Able to understand and use rate of perceived exertion (RPE) scale Yes       Intervention Provide education and explanation on how to use RPE scale       Expected Outcomes Short Term: Able to use RPE daily in rehab to express subjective intensity level;Long Term:  Able to use RPE to guide intensity level when exercising independently       Knowledge and understanding of Target Heart Rate Range (THRR) Yes       Intervention Provide education and explanation of THRR including how the numbers were predicted and where they are located for reference       Expected Outcomes Short Term: Able to state/look up THRR;Short Term: Able to use daily as guideline for intensity in rehab;Long Term: Able to use THRR to govern intensity when exercising independently       Understanding of Exercise Prescription Yes       Intervention Provide education, explanation, and written materials on patient's individual exercise prescription       Expected Outcomes Short Term: Able to explain program exercise prescription;Long Term: Able to explain home exercise prescription to exercise independently              Exercise Goals Re-Evaluation:  Exercise Goals Re-Evaluation    Row Name 04/09/20 1444 04/28/20 1535 05/28/20 1500         Exercise Goal Re-Evaluation   Exercise Goals Review Increase Physical Activity;Increase Strength and Stamina;Able to understand and use rate of  perceived exertion (RPE) scale;Knowledge and understanding of Target Heart Rate Range (THRR);Understanding of Exercise Prescription Increase Physical Activity;Increase Strength and Stamina;Able to understand and use rate of perceived exertion (RPE) scale;Knowledge and understanding of Target Heart Rate Range (THRR);Able to check pulse independently;Understanding of Exercise Prescription Increase Physical Activity;Increase Strength and Stamina;Able to understand and use rate of perceived exertion (RPE) scale;Knowledge and understanding of Target Heart Rate Range (THRR);Able to check pulse independently;Understanding of Exercise Prescription     Comments Pt's first day of exercise in the [facility]2 program. Pt understands the RPE scale, THRR, and exercise Rx. Tolerated initial session well with no comaplaints. Reviewed METs and home exerecise Rx with patient today. Pt will walk at home 2-3x/week for 30-45 minutes. Pt graduated from the [facility] program. Pt progressed well throught the program and had an average MET level of 3.1. Pt plans to continue her exercise at [employer] 5-7x/week for 30-45 minutes.     Expected Outcomes Will continue to monitor pt progress and advance exercise workloads as tolerated. Pt will exercise at home on her own on her off days from the [facility] program Pt will continue to exercise on her own at home.            Nutrition & Weight - Outcomes:  Pre Biometrics - 03/30/20 1400      Pre Biometrics   Height 5' 4.5" (1.638 m)    Weight 95.7 kg    Waist Circumference 48 inches    Hip Circumference 47 inches    Waist to Hip Ratio 1.02 %    BMI (  Calculated) 35.67    Triceps Skinfold 30 mm    % Body Fat 46.9 %    Grip Strength 37 kg    Flexibility 10.75 in    Single Leg Stand 17.25 seconds           Post Biometrics - 05/26/20 1315       Post  Biometrics   Height 5' 4.5" (1.638 m)    Weight 94.7 kg    Waist Circumference 48 inches    Hip Circumference 46.75 inches    Waist to  Hip Ratio 1.03 %    BMI (Calculated) 35.3    Triceps Skinfold 29 mm    % Body Fat 46.6 %    Grip Strength 39 kg    Flexibility 11.75 in    Single Leg Stand 10.75 seconds           Nutrition:  Nutrition Therapy & Goals - 05/10/20 1452      Nutrition Therapy   Diet TLC; carb modified      Personal Nutrition Goals   Nutrition Goal Pt to build a healthy plate including vegetables, fruits, whole grains, and low-fat dairy products in a heart healthy meal plan.    Personal Goal #2 CBG concentrations in the normal range or as close to normal as is safely possible.    Personal Goal #3 Pt to take insulin pen with her when she leaves the house to avoid missed doses      Intervention Plan   Intervention Prescribe, educate and counsel regarding individualized specific dietary modifications aiming towards targeted core components such as weight, hypertension, lipid management, diabetes, heart failure and other comorbidities.;Nutrition handout(s) given to patient.    Expected Outcomes Short Term Goal: A plan has been developed with personal nutrition goals set during dietitian appointment.;Long Term Goal: Adherence to prescribed nutrition plan.           Nutrition Discharge:   Education Questionnaire Score:  Knowledge Questionnaire Score - 05/19/20 1617      Knowledge Questionnaire Score   Post Score 24/24           Goals reviewed with patient; copy given to patient.[place] graduated from cardiac rehab program today with completion of 17 exercise sessions in Phase II. Pt maintained good attendance and progressed nicely during his participation in rehab as evidenced by increased MET level.   Medication list reconciled. Repeat  PHQ score- 0 .  Pt has made significant lifestyle changes and should be commended for her success. Pt feels she has achieved her goals during cardiac rehab.   Pt plans to continue exercise by attending [employer]. Kimerly increased her distance on her post exercise walk  test by 152 feet.We are proud of Harlym's progress! Barnet Pall, RN,BSN 06/08/2020 8:58 AM

## 2020-05-28 ENCOUNTER — Other Ambulatory Visit: Payer: Self-pay

## 2020-05-28 ENCOUNTER — Encounter (HOSPITAL_COMMUNITY)
Admission: RE | Admit: 2020-05-28 | Discharge: 2020-05-28 | Disposition: A | Discharge status: Home / Self Care | Payer: Medicare HMO | Source: Ambulatory Visit | Attending: Cardiovascular Disease | Admitting: Cardiovascular Disease

## 2020-05-28 DIAGNOSIS — Z955 Presence of coronary angioplasty implant and graft: Secondary | ICD-10-CM

## 2020-05-28 DIAGNOSIS — Z48812 Encounter for surgical aftercare following surgery on the circulatory system: Secondary | ICD-10-CM | POA: Diagnosis not present

## 2020-07-01 ENCOUNTER — Other Ambulatory Visit: Payer: Self-pay | Admitting: Cardiovascular Disease

## 2020-08-10 NOTE — Progress Notes (Signed)
Chief Complaint  Patient presents with  . Follow-up    CAD     History of Present Illness: 50 yo female with history of CAD s/p 4V CABG 2011, mitral regurgitation s/p repair, chronic diastolic CHF, DM, HTN and hyperlipidemia who is here today for cardiac follow up. Cardiac cath  In December 2010 showed severe proximal LAD stenosis, severe mid intermediate disease with occluded mid RCA and anomalous Circumflex with severe disease. She also had severe mitral regurgitation. She underwent a 4V CABG  (LIMA to LAD, SVG to subbranch of ramus intermediate, SVG to lateral subbranch of ramus intermediate, SVG to distal RCA) and mitral valve repair January 2011. Echo September 2016 with normal LV systolic function, stable mitral valve repair. She had chest pain in June 2018 and her nuclear stress test showed ischemia. Cardiac cath on 09/22/16 showed progression of her CAD. LV function was normal. I placed a drug eluting stent in the vein graft to the PDA, a drug eluting stent in the native Ramus intermediate branch and a drug eluting stent in the protected left main. Repeat cardiac cath 02/07/17 and found to have continued occlusion of all three native vessels with patent LIMA to LAD, occluded SVG to ramus x 2, patent SVG to PDA but severe restenosis in the ramus intermediate stent. This was treated with another drug eluting stent. Her BP was soft in the hospital so Norvasc was stopped.  Coreg dose lowered at office visit in February 2019 due to fatigue. Echo August 2020 with LVEF=60-65%, mitral valve repair intact with mild stenosis. She had chest pain in April 2021. Nuclear stress test April 2021  without ischemia. Imdur was added and her chest pain resolved. She had recurrent chest pain in October 2021. Cardiac cath 01/30/20 with 2/4 patent bypass grafts, chronic occlusion small Circumflex artery, chronic occlusion proximal RCA, patent left main stent. Patent ramus intermediate stent with severe distal stent  stenosis which was treated with a drug eluting stent. The LIMA to the LAD was patent. The vein graft to the PDA is patent but severe stenosis posterolateral artery. A drug eluting stent was placed in the native posterolateral artery through the vein graft.   She is here today for follow up. The patient denies any chest pain, dyspnea, palpitations, lower extremity edema, orthopnea, PND, dizziness, near syncope or syncope. She feels great. She is exercising. HgbAIC 6.8. She has lost seven lbs.    Primary Care Physician: Gwendel Hanson  Past Medical History:  Diagnosis Date  . Chronic diastolic CHF (congestive heart failure) ([place])   . Coronary artery disease    a. 4v CABG  (LIMA to LAD, SVG to subbranch of ramus intermediate, SVG to lateral subbranch of ramus intermediate, SVG to distal RCA) and mitral valve repair January 2011. b. Abnormal nuc 09/2016 s/p  drug eluting stent to the vein graft to the PDA, a drug eluting stent in the native Ramus intermediate branch and a drug eluting stent in the protected left main. c. DES to ramus intermediate 02/2017.   . Diabetes mellitus   . H/O emotional problems   . Headache, migraine   . High cholesterol   . History of blood transfusion   . History of nuclear stress test    Myoview 07/2019: EF 60, normal perfusion, low risk.  Marland Kitchen Hypertension   . MI, old   . MRSA (methicillin resistant Staphylococcus aureus)   . MVP (mitral valve prolapse)   . Neuromuscular disorder ([place])   .  Neuropathy   . S/P mitral valve repair   . Sebaceous cyst     Past Surgical History:  Procedure Laterality Date  . ABDOMINAL HYSTERECTOMY    . CARDIAC CATHETERIZATION  09/22/2016  . CORONARY ARTERY BYPASS GRAFT  with valve repair  . CORONARY STENT INTERVENTION N/A 09/22/2016   Procedure: Coronary Stent Intervention;  Surgeon: Burnell Blanks, MD;  Location: [facility];  Service: Cardiovascular;  Laterality: N/A;  . CORONARY STENT INTERVENTION N/A  02/07/2017   Procedure: CORONARY STENT INTERVENTION;  Surgeon: Martinique, Peter M, MD;  Location: [facility];  Service: Cardiovascular;  Laterality: N/A;  . CORONARY STENT INTERVENTION N/A 01/30/2020   Procedure: CORONARY STENT INTERVENTION;  Surgeon: Burnell Blanks, MD;  Location: [facility];  Service: Cardiovascular;  Laterality: N/A;  . CORONARY STENT PLACEMENT  09/22/2016  . LEFT HEART CATH AND CORS/GRAFTS ANGIOGRAPHY N/A 09/22/2016   Procedure: Left Heart Cath and Cors/Grafts Angiography;  Surgeon: Burnell Blanks, MD;  Location: [facility];  Service: Cardiovascular;  Laterality: N/A;  . LEFT HEART CATH AND CORS/GRAFTS ANGIOGRAPHY N/A 02/07/2017   Procedure: LEFT HEART CATH AND CORS/GRAFTS ANGIOGRAPHY;  Surgeon: Martinique, Peter M, MD;  Location: [facility];  Service: Cardiovascular;  Laterality: N/A;  . LEFT HEART CATH AND CORS/GRAFTS ANGIOGRAPHY N/A 01/30/2020   Procedure: LEFT HEART CATH AND CORS/GRAFTS ANGIOGRAPHY;  Surgeon: Burnell Blanks, MD;  Location: [facility];  Service: Cardiovascular;  Laterality: N/A;  . SKIN GRAFT      Current Outpatient Medications  Medication Sig Dispense Refill  . aspirin 81 MG tablet Take 81 mg by mouth daily.     . carvedilol (COREG) 3.125 MG tablet TAKE 1 TABLET BY MOUTH TWICE A DAY 180 tablet 3  . clopidogrel (PLAVIX) 75 MG tablet TAKE 1 TABLET (75 MG TOTAL) BY MOUTH DAILY WITH BREAKFAST. 90 tablet 3  . Continuous Blood Gluc Sensor (FREESTYLE LIBRE 2 SENSOR) MISC Apply 1 Device topically every 14 (fourteen) days. Wears continuously changes every 14 days    . ergocalciferol (VITAMIN D2) 1.25 MG (50000 UT) capsule Take 50,000 Units by mouth once a week. Sunday    . gabapentin (NEURONTIN) 300 MG capsule Take 300 mg by mouth 3 (three) times daily.     . insulin aspart (NOVOLOG) 100 UNIT/ML injection Inject 8-12 Units into the skin See admin instructions. Per sliding scale Under 150 no units 150-199  take 8 units 200-249 take 10 units 250-299 take 12 units    . isosorbide mononitrate (IMDUR) 30 MG 24 hr tablet TAKE 0.5 TABLETS (15 MG TOTAL) BY MOUTH DAILY. 45 tablet 3  . Lancets (ONETOUCH DELICA PLUS [id number]) MISC USE TO CHECK BLOOD SUGARS 3 TIMES A DAY (NEED PART B CARD)    . Liraglutide -Weight Management (SAXENDA [place]) Inject 1.8 Units into the skin daily.    . metFORMIN (GLUCOPHAGE-XR) 500 MG 24 hr tablet Take 500 mg by mouth in the morning and at bedtime.     Marland Kitchen NARCAN 4 MG/0.1ML LIQD nasal spray kit Place 0.4 mg into the nose once.     . nitroGLYCERIN (NITROSTAT) 0.4 MG SL tablet PLACE 1 TABLET UNDER THE TONGUE EVERY 5 MINUTES AS NEEDED FOR CHEST PAIN (UP TO 3 DOSES/15MIN) 75 tablet 2  . ondansetron (ZOFRAN) 4 MG tablet Take 4 mg by mouth every 8 (eight) hours as needed for nausea or vomiting.     Glory Rosebush VERIO test strip     .  oxyCODONE-acetaminophen (PERCOCET) 10-325 MG per tablet Take 1 tablet by mouth 5 (five) times daily.     . pantoprazole (PROTONIX) 40 MG tablet Take 1 tablet (40 mg total) by mouth daily. 30 tablet 11  . rosuvastatin (CRESTOR) 40 MG tablet TAKE 1 TABLET (40 MG TOTAL) EVERY EVENING BY MOUTH. 90 tablet 3  . Semaglutide 1 MG/DOSE SOPN Inject 1 mg into the skin every Sunday. "Ozempic"    . TRESIBA FLEXTOUCH 100 UNIT/ML SOPN FlexTouch Pen Inject 60 Units as directed at bedtime.   4  . zolpidem (AMBIEN) 5 MG tablet TAKE 1 TABLET BY MOUTH EVERY DAY AT BEDTIME AS NEEDED FOR SLEEP 30 tablet 5   No current facility-administered medications for this visit.    Allergies  Allergen Reactions  . Metformin Diarrhea and Nausea And Vomiting    Immediate release tablets (not metformin ER, pt currently taking)    Social History   Socioeconomic History  . Marital status: Divorced    Spouse name: Not on file  . Number of children: 2  . Years of education: Not on file  . Highest education level: Not on file  Occupational History  . Occupation: Disability  Tobacco Use   . Smoking status: Former Smoker    Packs/day: 1.00    Years: 28.00    Pack years: 28.00    Types: Cigarettes    Quit date: 03/31/2009    Years since quitting: 11.3  . Smokeless tobacco: Never Used  Vaping Use  . Vaping Use: Never used  Substance and Sexual Activity  . Alcohol use: No  . Drug use: No  . Sexual activity: Yes    Birth control/protection: Surgical    Comment: partial hysterectomy  Other Topics Concern  . Not on file  Social History Narrative  . Not on file   Social Determinants of Health   Financial Resource Strain: Not on file  Food Insecurity: Not on file  Transportation Needs: Not on file  Physical Activity: Not on file  Stress: Not on file  Social Connections: Not on file  Intimate Partner Violence: Not on file    Family History  Problem Relation Age of Onset  . Heart disease Father   . Asthma Father   . Hypertension Father   . Diabetes Father   . Arthritis Father   . COPD Father   . Heart disease Mother   . Hypertension Mother   . Diabetes Mother   . Migraines Mother   . Stroke Mother   . Emphysema Maternal Grandmother   . Hypertension Brother   . Diabetes Brother   . Breast cancer Neg Hx     Review of Systems:  As stated in the HPI and otherwise negative.   BP 132/70   Pulse 71   Ht 5' 4.5" (1.638 m)   Wt 201 lb 9.6 oz (91.4 kg)   SpO2 98%   BMI 34.07 kg/m   Physical Examination:  General: Well developed, well nourished, NAD  HEENT: OP clear, mucus membranes moist  SKIN: warm, dry. No rashes. Neuro: No focal deficits  Musculoskeletal: Muscle strength 5/5 all ext  Psychiatric: Mood and affect normal  Neck: No JVD, no carotid bruits, no thyromegaly, no lymphadenopathy.  Lungs:Clear bilaterally, no wheezes, rhonci, crackles Cardiovascular: Regular rate and rhythm. No murmurs, gallops or rubs. Abdomen:Soft. Bowel sounds present. Non-tender.  Extremities: No lower extremity edema. Pulses are 2 + in the bilateral DP/PT.  Echo  August 2020: 1. The left ventricle has normal  systolic function with an ejection  fraction of 60-65%. The cavity size was normal. Left ventricular diastolic  Doppler parameters are indeterminate. There is abnormal septal motion  consistent with post-operative status.  2. The right ventricle has normal systolic function. The cavity was  normal. There is no increase in right ventricular wall thickness. Right  ventricular systolic pressure TR inadequate for RVSP assessment.  3. A Annuloplasty ring present valve is present in the mitral position.  Procedure Date: 2011.  4. Mild mitral valve stenosis.  5. S/p mitral valve annuloplasty. Mean gradient across valve ~4.3 mmHG @  HR 65 bpm. Gradients similar to prior study.  6. The tricuspid valve is grossly normal.  7. The aortic valve is tricuspid. No stenosis of the aortic valve.  8. The aorta is normal unless otherwise noted.  9. The aortic root and ascending aorta are normal in size and structure.  10. When compared to the prior study: No significant change from prior  study (12/04/2014).   EKG:  EKG is not ordered today. The ekg ordered today demonstrates   Recent Labs: 01/26/2020: ALT 18; BUN 15; Creatinine, Ser 1.14; Hemoglobin 14.7; Platelets 297; Potassium 4.7; Sodium 136   Lipid Panel  Lipid Panel     Component Value Date/Time   CHOL 136 01/26/2020 1220   TRIG 200 (H) 01/26/2020 1220   HDL 35 (L) 01/26/2020 1220   CHOLHDL 3.9 01/26/2020 1220   CHOLHDL 8.9 04/03/2009 0320   VLDL 54 (H) 04/03/2009 0320   LDLCALC 68 01/26/2020 1220    Wt Readings from Last 3 Encounters:  08/11/20 201 lb 9.6 oz (91.4 kg)  05/26/20 208 lb 12.4 oz (94.7 kg)  03/30/20 210 lb 15.7 oz (95.7 kg)     Other studies Reviewed: Additional studies/ records that were reviewed today include: . Review of the above records demonstrates:    Assessment and Plan:   1. CAD with stable angina: No chest pain. Continue ASA and Plavix for lifetime.  Continue beta blocker, Imdur and statin.    2. HYPERLIPIDEMIA: LDL at goal in October 2021. Continue statin.   3. HYPERTENSION: BP is well controled. Continue current therapy  4. TOBACCO ABUSE, in remission: She stopped smoking in 2010.   5. Mitral regurgitation: Echo in 2020 showed that the mitral valve repair was stable with mild stenosis. Repeat echo in 2023.   6. Chronic diastolic CHF: No volume overload on exam.   7. Leg pain: ABI normal November 2018  8. Insomnia: Continue Ambien  9. Pre-operative cardiovascular examination: She can hold ASA and Plavix for breast reduction surgery after October of 2022.   Current medicines are reviewed at length with the patient today.  The patient does not have concerns regarding medicines.  The following changes have been made:  no change  Labs/ tests ordered today include:   No orders of the defined types were placed in this encounter.   Disposition:   F/U with me one year  Signed, Lauree Chandler, MD 08/11/2020 11:20 AM    [facility] [address], [place], [place]  [address] Phone: [phone]; Fax: [phone]

## 2020-08-11 ENCOUNTER — Encounter: Payer: Self-pay | Admitting: Cardiovascular Disease

## 2020-08-11 ENCOUNTER — Other Ambulatory Visit: Payer: Self-pay

## 2020-08-11 ENCOUNTER — Ambulatory Visit (INDEPENDENT_AMBULATORY_CARE_PROVIDER_SITE_OTHER): Payer: Medicare HMO | Admitting: Cardiovascular Disease

## 2020-08-11 VITALS — BP 132/70 | HR 71 | Ht 64.5 in | Wt 201.6 lb

## 2020-08-11 DIAGNOSIS — I5032 Chronic diastolic (congestive) heart failure: Secondary | ICD-10-CM | POA: Diagnosis not present

## 2020-08-11 DIAGNOSIS — Z9889 Other specified postprocedural states: Secondary | ICD-10-CM

## 2020-08-11 DIAGNOSIS — I25810 Atherosclerosis of coronary artery bypass graft(s) without angina pectoris: Secondary | ICD-10-CM

## 2020-08-11 DIAGNOSIS — I10 Essential (primary) hypertension: Secondary | ICD-10-CM

## 2020-08-11 DIAGNOSIS — E7800 Pure hypercholesterolemia, unspecified: Secondary | ICD-10-CM

## 2020-08-11 NOTE — Patient Instructions (Signed)
Medication Instructions:  Your physician recommends that you continue on your current medications as directed. Please refer to the Current Medication list given to you today.  *If you need a refill on your cardiac medications before your next appointment, please call your pharmacy*   Lab Work: None If you have labs (blood work) drawn today and your tests are completely normal, you will receive your results only by: Marland Kitchen MyChart Message (if you have MyChart) OR . A paper copy in the mail If you have any lab test that is abnormal or we need to change your treatment, we will call you to review the results.   Testing/Procedures: None   Follow-Up: At [facility], you and your health needs are our priority.  As part of our continuing mission to provide you with exceptional heart care, we have created designated Provider Care Teams.  These Care Teams include your primary Cardiologist (physician) and Advanced Practice Providers (APPs -  Physician Assistants and Nurse Practitioners) who all work together to provide you with the care you need, when you need it.  We recommend signing up for the patient portal called "MyChart".  Sign up information is provided on this After Visit Summary.  MyChart is used to connect with patients for Virtual Visits (Telemedicine).  Patients are able to view lab/test results, encounter notes, upcoming appointments, etc.  Non-urgent messages can be sent to your provider as well.   To learn more about what you can do with MyChart, go to [email or web address].    Your next appointment:   1 year(s)  The format for your next appointment:   In Person  Provider:   You may see Lauree Chandler, MD or one of the following Advanced Practice Providers on your designated Care Team:    Melina Copa, PA-C  Ermalinda Barrios, PA-C    Other Instructions

## 2020-08-23 ENCOUNTER — Other Ambulatory Visit: Payer: Self-pay | Admitting: Cardiovascular Disease

## 2020-09-01 ENCOUNTER — Other Ambulatory Visit: Payer: Self-pay | Admitting: Cardiovascular Disease

## 2020-10-23 ENCOUNTER — Other Ambulatory Visit: Payer: Self-pay | Admitting: Cardiovascular Disease

## 2020-11-12 ENCOUNTER — Other Ambulatory Visit: Payer: Self-pay | Admitting: Cardiology

## 2020-11-12 DIAGNOSIS — Z1231 Encounter for screening mammogram for malignant neoplasm of breast: Secondary | ICD-10-CM

## 2021-01-07 ENCOUNTER — Ambulatory Visit
Admission: RE | Admit: 2021-01-07 | Discharge: 2021-01-07 | Disposition: A | Discharge status: Home / Self Care | Payer: Medicare HMO | Source: Ambulatory Visit | Attending: Cardiology | Admitting: Cardiology

## 2021-01-07 ENCOUNTER — Other Ambulatory Visit: Payer: Self-pay

## 2021-01-07 DIAGNOSIS — Z1231 Encounter for screening mammogram for malignant neoplasm of breast: Secondary | ICD-10-CM

## 2021-01-17 ENCOUNTER — Telehealth: Payer: Self-pay | Admitting: Cardiovascular Disease

## 2021-01-17 NOTE — Telephone Encounter (Signed)
Patient is following up regarding clearance for a breast reduction (see 03/11/20 phone encounter).  She states she is aware she was cleared by our office at that time, but Dr. Crissie Reese' office is needs a letter stating the patient is cleared to undergo anesthesia.  Dr. Angelena Form also cleared the patient to hold ASA and Plavix, but will she need a new clearance since it has been several months since the initial recommendation?  Please advise.

## 2021-01-18 NOTE — Telephone Encounter (Signed)
   Pre-operative Risk Assessment    Patient Name: Elizabeth Park  DOB: 12-Jun-1970 MRN: [id number]      Request for Surgical Clearance   Procedure:   B/L BREAST REDUCTION  Date of Surgery: Clearance TBD SOMETIME IN NOV OR DEC 2022                                Surgeon:  DR. Crissie Reese Surgeon's Group or Practice Name:  [facility] Phone number:  [phone] Fax number:  [phone]   Type of Clearance Requested: - Medical  - Pharmacy:  Hold Aspirin and Clopidogrel (Plavix)     Type of Anesthesia:   General    Additional requests/questions: DR. Harlow Mares IS REQUESTING FOR PLAVIX TO BE HELD x 6 DAYS PRIOR TO SURGERY AND REMAIN OFF S/P x 2 WEEKS; DR. Harlow Mares IS ALSO REQUESTING TO HOLD ASA 81 MG x 2 WEEKS PRIOR TO SURGERY AND REMAIN OFF x 2 WEEKS S/P  Signed, Julaine Hua   01/18/2021, 4:59 PM

## 2021-01-18 NOTE — Telephone Encounter (Signed)
   Name: Elizabeth Park  DOB: 1971/03/12  MRN: [id number]   Primary Cardiologist: Lauree Chandler, MD  Chart reviewed as part of pre-operative protocol coverage. Patient was contacted 01/18/2021 in reference to pre-operative risk assessment for pending surgery as outlined below.  Elizabeth Park was last seen on 08/11/2020 by Dr. Angelena Form.  Since that day, Elizabeth Park has done well without exertional chest pain or worsening dyspnea.  She is clearly able to accomplish more than 4 METS of activity without any issue.  Dr. Angelena Form previously cleared the patient for the same procedure in May.  She may hold aspirin and Plavix for 1 week prior to the surgery and restart it as soon as possible afterward at the discretion of the surgeon based on the bleeding risk.  Therefore, based on ACC/AHA guidelines, the patient would be at acceptable risk for the planned procedure without further cardiovascular testing.   The patient was advised that if she develops new symptoms prior to surgery to contact our office to arrange for a follow-up visit, and she verbalized understanding.  I will route this recommendation to the requesting party via Epic fax function and remove from pre-op pool. Please call with questions.  [place], [place] 01/18/2021, 6:26 PM

## 2021-01-21 ENCOUNTER — Telehealth: Payer: Self-pay | Admitting: Cardiovascular Disease

## 2021-01-21 NOTE — Telephone Encounter (Signed)
Re-faxed pre op clearance notes to requesting surgeon's office.

## 2021-01-21 NOTE — Telephone Encounter (Signed)
Preoperative team-please resend preoperative cardiac evaluation from 01/18/2021.  If Requesting office has changed procedure or has further questions please have them resubmit their preoperative cardiac evaluation form.  We will then review it and make recommendations.  Thank you for your help.  Jossie Ng. Allah Reason NP-C    01/21/2021, 1:17 PM [place] [address] Office [phone] Fax [phone]

## 2021-01-21 NOTE — Telephone Encounter (Signed)
New MessageL:     Patient said she is scheduled to have breat surgery in December or January. She said she does not want to talk to anybody but Dr Angelena Form or his nurse please.

## 2021-01-21 NOTE — Telephone Encounter (Signed)
Spoke with the patient who states that she needs to have an appointment with Dr. Angelena Form to be cleared for surgery. I advised the patient that she was cleared for surgery and does not need an appointment with Dr. Angelena Form. She states that her surgeon told her that the PA sent over the incorrect information and that they will not proceed with the procedure unless she is seen. I advised the patient that we have cleared her and do not need to see her. Advised that I would have the pre-op team resend the clearance to her surgeon. If her surgeon's office is not okay with this then they will need to contact us to discuss further.

## 2021-03-25 ENCOUNTER — Other Ambulatory Visit: Payer: Self-pay

## 2021-03-25 ENCOUNTER — Emergency Department (HOSPITAL_BASED_OUTPATIENT_CLINIC_OR_DEPARTMENT_OTHER)
Admission: EM | Admit: 2021-03-25 | Discharge: 2021-03-25 | Disposition: A | Discharge status: Home / Self Care | Payer: Medicare HMO | Attending: Emergency Medicine | Admitting: Emergency Medicine

## 2021-03-25 ENCOUNTER — Encounter (HOSPITAL_BASED_OUTPATIENT_CLINIC_OR_DEPARTMENT_OTHER): Payer: Self-pay | Admitting: Emergency Medicine

## 2021-03-25 ENCOUNTER — Emergency Department (HOSPITAL_BASED_OUTPATIENT_CLINIC_OR_DEPARTMENT_OTHER): Payer: Medicare HMO

## 2021-03-25 DIAGNOSIS — R519 Headache, unspecified: Secondary | ICD-10-CM | POA: Insufficient documentation

## 2021-03-25 DIAGNOSIS — Z7984 Long term (current) use of oral hypoglycemic drugs: Secondary | ICD-10-CM | POA: Diagnosis not present

## 2021-03-25 DIAGNOSIS — Z951 Presence of aortocoronary bypass graft: Secondary | ICD-10-CM | POA: Insufficient documentation

## 2021-03-25 DIAGNOSIS — I2510 Atherosclerotic heart disease of native coronary artery without angina pectoris: Secondary | ICD-10-CM | POA: Diagnosis not present

## 2021-03-25 DIAGNOSIS — I110 Hypertensive heart disease with heart failure: Secondary | ICD-10-CM | POA: Diagnosis not present

## 2021-03-25 DIAGNOSIS — Z87891 Personal history of nicotine dependence: Secondary | ICD-10-CM | POA: Insufficient documentation

## 2021-03-25 DIAGNOSIS — Z794 Long term (current) use of insulin: Secondary | ICD-10-CM | POA: Insufficient documentation

## 2021-03-25 DIAGNOSIS — E119 Type 2 diabetes mellitus without complications: Secondary | ICD-10-CM | POA: Diagnosis not present

## 2021-03-25 DIAGNOSIS — I5032 Chronic diastolic (congestive) heart failure: Secondary | ICD-10-CM | POA: Diagnosis not present

## 2021-03-25 DIAGNOSIS — Z79899 Other long term (current) drug therapy: Secondary | ICD-10-CM | POA: Insufficient documentation

## 2021-03-25 DIAGNOSIS — Z7901 Long term (current) use of anticoagulants: Secondary | ICD-10-CM | POA: Insufficient documentation

## 2021-03-25 DIAGNOSIS — S199XXA Unspecified injury of neck, initial encounter: Secondary | ICD-10-CM | POA: Diagnosis present

## 2021-03-25 DIAGNOSIS — Y92410 Unspecified street and highway as the place of occurrence of the external cause: Secondary | ICD-10-CM | POA: Insufficient documentation

## 2021-03-25 DIAGNOSIS — S161XXA Strain of muscle, fascia and tendon at neck level, initial encounter: Secondary | ICD-10-CM | POA: Insufficient documentation
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
## 2021-03-25 NOTE — ED Notes (Signed)
Pt to CT
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
## 2021-05-26 ENCOUNTER — Other Ambulatory Visit: Payer: Self-pay | Admitting: Cardiovascular Disease

## 2021-06-24 ENCOUNTER — Other Ambulatory Visit: Payer: Self-pay | Admitting: Cardiovascular Disease

## 2021-07-18 ENCOUNTER — Telehealth: Payer: Self-pay

## 2021-07-18 NOTE — Telephone Encounter (Signed)
? ?  Pre-operative Risk Assessment  ?  ?Patient Name: Elizabeth Park  ?DOB: [birth year] ?MRN: [id number] ? ?  ? ?Request for Surgical Clearance   ? ?Procedure:   Partial Phineas Douglas Fasciectomy ? ?Date of Surgery:  Clearance 08/03/21                                ?Surgeon:  Dr Iran Planas IV ?Surgeon's Group or Practice Name:  [facility] ?Phone number:  [phone] ?Fax number:  [phone] ?  ?Type of Clearance Requested:   ?- Pharmacy:  Hold Clopidogrel (Plavix)   ?  ?Type of Anesthesia:   None Listed ?  ?Additional requests/questions:   n/a ? ?Signed, ?Ulice Brilliant T   ?07/18/2021, 1:47 PM  ? ?

## 2021-07-18 NOTE — Telephone Encounter (Signed)
? ? ?  Name: Elizabeth Park  ?DOB: 1971-03-07  ?MRN: [id number] ? ?Primary Cardiologist: Lauree Chandler, MD ? ? ?Preoperative team, please contact this patient and set up a phone call appointment for further preoperative risk assessment. Please obtain consent and complete medication review. Thank you for your help. ? ?Last OV 08/11/2020 (within 12 month timeframe) at which time Dr. Angelena Form commented on holding blood thinners for unrelated procedure; can reference at time of tele-visit. ? ? ? ?Charlie Pitter, PA-C ?07/18/2021, 2:35 PM ?[facility] ?[address] ?[place], [place] [address] ?  ? ?

## 2021-07-19 ENCOUNTER — Ambulatory Visit (INDEPENDENT_AMBULATORY_CARE_PROVIDER_SITE_OTHER): Payer: Medicare HMO | Admitting: Physician Assistant

## 2021-07-19 ENCOUNTER — Telehealth: Payer: Self-pay | Admitting: *Deleted

## 2021-07-19 ENCOUNTER — Encounter: Payer: Self-pay | Admitting: Physician Assistant

## 2021-07-19 DIAGNOSIS — Z01810 Encounter for preprocedural cardiovascular examination: Secondary | ICD-10-CM | POA: Diagnosis not present

## 2021-07-19 NOTE — Telephone Encounter (Signed)
Pt agreeable to plan of care for tele pre op appt today at 4 pm with Melina Copa, PAC. Med rec and consent are done. Pt still wants to keep her appt in May with MD as well.  ? ?  ?Patient Consent for Virtual Visit  ? ? ?   ? ?Elizabeth Park has provided verbal consent on 07/19/2021 for a virtual visit (video or telephone). ? ? ?CONSENT FOR VIRTUAL VISIT FOR:  Elizabeth Park  ?By participating in this virtual visit I agree to the following: ? ?I hereby voluntarily request, consent and authorize [facility] and its employed or contracted physicians, physician assistants, nurse practitioners or other licensed health care professionals (the Practitioner), to provide me with telemedicine health care services (the ?Services") as deemed necessary by the treating Practitioner. I acknowledge and consent to receive the Services by the Practitioner via telemedicine. I understand that the telemedicine visit will involve communicating with the Practitioner through live audiovisual communication technology and the disclosure of certain medical information by electronic transmission. I acknowledge that I have been given the opportunity to request an in-person assessment or other available alternative prior to the telemedicine visit and am voluntarily participating in the telemedicine visit. ? ?I understand that I have the right to withhold or withdraw my consent to the use of telemedicine in the course of my care at any time, without affecting my right to future care or treatment, and that the Practitioner or I may terminate the telemedicine visit at any time. I understand that I have the right to inspect all information obtained and/or recorded in the course of the telemedicine visit and may receive copies of available information for a reasonable fee.  I understand that some of the potential risks of receiving the Services via telemedicine include:  ?Delay or interruption in medical evaluation due to technological equipment failure  or disruption; ?Information transmitted may not be sufficient (e.g. poor resolution of images) to allow for appropriate medical decision making by the Practitioner; and/or  ?In rare instances, security protocols could fail, causing a breach of personal health information. ? ?Furthermore, I acknowledge that it is my responsibility to provide information about my medical history, conditions and care that is complete and accurate to the best of my ability. I acknowledge that Practitioner's advice, recommendations, and/or decision may be based on factors not within their control, such as incomplete or inaccurate data provided by me or distortions of diagnostic images or specimens that may result from electronic transmissions. I understand that the practice of medicine is not an exact science and that Practitioner makes no warranties or guarantees regarding treatment outcomes. I acknowledge that a copy of this consent can be made available to me via my patient portal ([facility]), or I can request a printed copy by calling the office of [facility].   ? ?I understand that my insurance will be billed for this visit.  ? ?I have read or had this consent read to me. ?I understand the contents of this consent, which adequately explains the benefits and risks of the Services being provided via telemedicine.  ?I have been provided ample opportunity to ask questions regarding this consent and the Services and have had my questions answered to my satisfaction. ?I give my informed consent for the services to be provided through the use of telemedicine in my medical care ? ? ? ?

## 2021-07-19 NOTE — Progress Notes (Signed)
? ?Virtual Visit via Telephone Note  ? ?This visit type was conducted due to national recommendations for restrictions regarding the COVID-19 Pandemic (e.g. social distancing) in an effort to limit this patient's exposure and mitigate transmission in our community.  Due to her co-morbid illnesses, this patient is at least at moderate risk for complications without adequate follow up.  This format is felt to be most appropriate for this patient at this time.  The patient did not have access to video technology/had technical difficulties with video requiring transitioning to audio format only (telephone).  All issues noted in this document were discussed and addressed.  No physical exam could be performed with this format.  Please refer to the patient's chart for her  consent to telehealth for [facility]. ? ?Evaluation Performed:  Preoperative cardiovascular risk assessment ?_____________  ? ?Date:  07/19/2021  ? ?Patient ID:  Elizabeth Park, Elizabeth Park Aug 01, 1970, MRN [id number] ?Patient Location:  ?Home ?Provider location:   ?Office ? ?Primary Care Provider:  Secundino Ginger, PA-C ?Primary Cardiologist:  Lauree Chandler, MD ? ?Chief Complaint  ?  ?51 y.o. y/o female with a h/o CAD s/p 4V CABG 2011 and mitral valve repair for severe mitral regurgitation, DESx3 in 09/2016, DES to ramus intermediate in 02/2017, DES to PLA in 01/2020, DM, HTN, HLD, former tobacco abuse, chronic diastolic CHF, who is pending partial palmer fasciectomy, and presents today for telephonic preoperative cardiovascular risk assessment. ? ?Past Medical History  ?  ?Past Medical History:  ?Diagnosis Date  ? Chronic diastolic CHF (congestive heart failure) ([place])   ? Coronary artery disease   ? a. 4v CABG  (LIMA to LAD, SVG to subbranch of ramus intermediate, SVG to lateral subbranch of ramus intermediate, SVG to distal RCA) and mitral valve repair January 2011. b. Abnormal nuc 09/2016 s/p  drug eluting stent to the vein graft to the PDA, a drug  eluting stent in the native Ramus intermediate branch and a drug eluting stent in the protected left main. c. DES to ramus intermediate 02/2017.   ? Diabetes mellitus   ? H/O emotional problems   ? Headache, migraine   ? High cholesterol   ? History of blood transfusion   ? History of nuclear stress test   ? Myoview 07/2019: EF 60, normal perfusion, low risk.  ? Hypertension   ? MI, old   ? MRSA (methicillin resistant Staphylococcus aureus)   ? MVP (mitral valve prolapse)   ? Neuromuscular disorder ([place])   ? Neuropathy   ? S/P mitral valve repair   ? Sebaceous cyst   ? ?Past Surgical History:  ?Procedure Laterality Date  ? ABDOMINAL HYSTERECTOMY    ? CARDIAC CATHETERIZATION  09/22/2016  ? CORONARY ARTERY BYPASS GRAFT  with valve repair  ? CORONARY STENT INTERVENTION N/A 09/22/2016  ? Procedure: Coronary Stent Intervention;  Surgeon: Burnell Blanks, MD;  Location: [place] [facility];  Service: Cardiovascular;  Laterality: N/A;  ? CORONARY STENT INTERVENTION N/A 02/07/2017  ? Procedure: CORONARY STENT INTERVENTION;  Surgeon: Martinique, Peter M, MD;  Location: [place] [facility];  Service: Cardiovascular;  Laterality: N/A;  ? CORONARY STENT INTERVENTION N/A 01/30/2020  ? Procedure: CORONARY STENT INTERVENTION;  Surgeon: Burnell Blanks, MD;  Location: [place] [facility];  Service: Cardiovascular;  Laterality: N/A;  ? CORONARY STENT PLACEMENT  09/22/2016  ? LEFT HEART CATH AND CORS/GRAFTS ANGIOGRAPHY N/A 09/22/2016  ? Procedure: Left Heart Cath and Cors/Grafts Angiography;  Surgeon: Burnell Blanks, MD;  Location: [place] [facility];  Service: Cardiovascular;  Laterality: N/A;  ? LEFT HEART CATH AND CORS/GRAFTS ANGIOGRAPHY N/A 02/07/2017  ? Procedure: LEFT HEART CATH AND CORS/GRAFTS ANGIOGRAPHY;  Surgeon: Martinique, Peter M, MD;  Location: [place] [facility];  Service: Cardiovascular;  Laterality: N/A;  ? LEFT HEART CATH AND CORS/GRAFTS ANGIOGRAPHY N/A 01/30/2020  ? Procedure: LEFT HEART CATH AND  CORS/GRAFTS ANGIOGRAPHY;  Surgeon: Burnell Blanks, MD;  Location: [place] [facility];  Service: Cardiovascular;  Laterality: N/A;  ? SKIN GRAFT    ? ? ?Allergies ? ?Allergies  ?Allergen Reactions  ? Metformin Diarrhea and Nausea And Vomiting  ?  Immediate release tablets (not metformin ER, pt currently taking)  ? ? ?History of Present Illness  ?  ?Elizabeth Park is a 51 y.o. female who presents via audio/video conferencing for a telehealth visit today.  Pt was last seen in cardiology clinic on 08/11/2020 by Dr. Angelena Form.  At that time Elizabeth Park was doing well.  The patient is now pending surgery as above.  Since her last visit, she has been doing great without any chest pain, shortness of breath, palpitations or syncope. She is able to do all her housework without angina and even remarks she was able to go up 3-4 flights of stairs without issue at the [place]. ? ? ?Home Medications  ?  ?Prior to Admission medications   ?Medication Sig Start Date End Date Taking? Authorizing Provider  ?aspirin 81 MG tablet Take 81 mg by mouth daily.  11/02/11   Hillary Bow, MD  ?carvedilol (COREG) 3.125 MG tablet TAKE 1 TABLET BY MOUTH TWICE A DAY 05/26/21   Burnell Blanks, MD  ?clopidogrel (PLAVIX) 75 MG tablet TAKE 1 TABLET BY MOUTH DAILY WITH BREAKFAST. 06/27/21   Burnell Blanks, MD  ?Continuous Blood Gluc Sensor (FREESTYLE LIBRE 2 SENSOR) MISC Apply 1 Device topically every 14 (fourteen) days. Wears continuously changes every 14 days    [provider]  ?ergocalciferol (VITAMIN D2) 1.25 MG (50000 UT) capsule Take 50,000 Units by mouth once a week. Sunday ?Patient not taking: Reported on 07/19/2021    [provider]  ?gabapentin (NEURONTIN) 300 MG capsule Take 300 mg by mouth 3 (three) times daily.  10/25/18   [provider]  ?insulin aspart (NOVOLOG) 100 UNIT/ML injection Inject 8-12 Units into the skin See admin instructions. Per sliding scale ?Under 150 no  units ?150-199 take 8 units ?200-249 take 10 units ?250-299 take 12 units    [provider]  ?isosorbide mononitrate (IMDUR) 30 MG 24 hr tablet TAKE 1/2 OF A TABLET (15 MG TOTAL) BY MOUTH DAILY 05/26/21   Burnell Blanks, MD  ?Lancets [facility] DELICA PLUS [id number]) MISC USE TO CHECK BLOOD SUGARS 3 TIMES A DAY (NEED PART B CARD) 07/17/18   [provider]  ?Liraglutide -Weight Management (SAXENDA [place]) Inject 1.8 Units into the skin daily.    [provider]  ?metFORMIN (GLUCOPHAGE-XR) 500 MG 24 hr tablet Take 500 mg by mouth in the morning and at bedtime.  10/13/18   [provider]  ?Darcel Bayley 5 MG/0.5ML Pen Inject 5 mg into the skin once a week. 07/05/21   [provider]  ?NARCAN 4 MG/0.1ML LIQD nasal spray kit Place 0.4 mg into the nose once.  07/23/18   [provider]  ?nitroGLYCERIN (NITROSTAT) 0.4 MG SL tablet PLACE 1 TABLET UNDER THE TONGUE EVERY 5 MINUTES AS NEEDED FOR CHEST PAIN (UP  TO 3 DOSES/15MIN) 10/25/20   Burnell Blanks, MD  ?ondansetron (ZOFRAN) 4 MG tablet Take 4 mg by mouth every 8 (eight) hours as needed for nausea or vomiting.  ?Patient not taking: Reported on 07/19/2021 11/28/19   [provider]  ?Roma Schanz test strip  10/21/18   [provider]  ?oxyCODONE-acetaminophen (PERCOCET) 10-325 MG per tablet Take 1 tablet by mouth 5 (five) times daily.     [provider]  ?pantoprazole (PROTONIX) 40 MG tablet Take 1 tablet (40 mg total) by mouth daily. 05/29/19   Burnell Blanks, MD  ?rosuvastatin (CRESTOR) 40 MG tablet TAKE 1 TABLET (40 MG TOTAL) EVERY EVENING BY MOUTH. 09/01/20   Burnell Blanks, MD  ?Semaglutide 1 MG/DOSE SOPN Inject 1 mg into the skin every Sunday. "Ozempic"    [provider]  ?TRESIBA FLEXTOUCH 100 UNIT/ML SOPN FlexTouch Pen Inject 60 Units as directed at bedtime.  08/13/16   [provider]  ?zolpidem (AMBIEN) 5 MG tablet TAKE 1 TABLET BY MOUTH EVERY  DAY AT BEDTIME AS NEEDED FOR SLEEP 08/18/19   Burnell Blanks, MD  ? ? ?Physical Exam  ?  ?Vital Signs:  Meggin TANEIL LAZARUS does not have vital signs available for review today. Recent VS reviewed from outside visit. No pr

## 2021-07-19 NOTE — Telephone Encounter (Signed)
Pt agreeable to plan of care for tele pre op appt today at 4 pm with Melina Copa, PAC. Med rec and consent are done. Pt still wants to keep her appt in May with MD as well.  ?

## 2021-08-03 ENCOUNTER — Other Ambulatory Visit: Payer: Self-pay

## 2021-08-12 NOTE — Progress Notes (Signed)
? ? ?Chief Complaint  ?Patient presents with  ? Follow-up  ?  CAD  ?  ?History of Present Illness: 51 yo female with history of CAD s/p 4V CABG 2011, mitral regurgitation s/p repair, chronic diastolic CHF, DM, HTN and hyperlipidemia who is here today for cardiac follow up. Cardiac cath  In December 2010 showed severe proximal LAD stenosis, severe mid intermediate disease with occluded mid RCA and anomalous Circumflex with severe disease. She also had severe mitral regurgitation. She underwent a 4V CABG  (LIMA to LAD, SVG to subbranch of ramus intermediate, SVG to lateral subbranch of ramus intermediate, SVG to distal RCA) and mitral valve repair January 2011. Echo September 2016 with normal LV systolic function, stable mitral valve repair. She had chest pain in June 2018 and her nuclear stress test showed ischemia. Cardiac cath on 09/22/16 showed progression of her CAD. LV function was normal. I placed a drug eluting stent in the vein graft to the PDA, a drug eluting stent in the native Ramus intermediate branch and a drug eluting stent in the protected left main. Repeat cardiac cath 02/07/17 and found to have continued occlusion of all three native vessels with patent LIMA to LAD, occluded SVG to ramus x 2, patent SVG to PDA but severe restenosis in the ramus intermediate stent. This was treated with another drug eluting stent. Her BP was soft in the hospital so Norvasc was stopped.  Coreg dose lowered at office visit in February 2019 due to fatigue. Echo August 2020 with LVEF=60-65%, mitral valve repair intact with mild stenosis. She had chest pain in April 2021. Nuclear stress test April 2021  without ischemia. Imdur was added and her chest pain resolved. She had recurrent chest pain in October 2021. Cardiac cath 01/30/20 with 2/4 patent bypass grafts, chronic occlusion small Circumflex artery, chronic occlusion proximal RCA, patent left main stent. Patent ramus intermediate stent with severe distal stent stenosis  which was treated with a drug eluting stent. The LIMA to the LAD was patent. The vein graft to the PDA is patent but severe stenosis posterolateral artery. A drug eluting stent was placed in the native posterolateral artery through the vein graft.  ? ?She is here today for follow up. The patient denies any chest pain, dyspnea, palpitations, lower extremity edema, orthopnea, PND, dizziness, near syncope or syncope. She is hoping to have breast reduction surgery.  ?  ?  ?Primary Care Physician: Secundino Ginger, PA-C ? ?Past Medical History:  ?Diagnosis Date  ? Chronic diastolic CHF (congestive heart failure) ([place])   ? Coronary artery disease   ? a. 4v CABG  (LIMA to LAD, SVG to subbranch of ramus intermediate, SVG to lateral subbranch of ramus intermediate, SVG to distal RCA) and mitral valve repair January 2011. b. Abnormal nuc 09/2016 s/p  drug eluting stent to the vein graft to the PDA, a drug eluting stent in the native Ramus intermediate branch and a drug eluting stent in the protected left main. c. DES to ramus intermediate 02/2017.   ? Diabetes mellitus   ? H/O emotional problems   ? Headache, migraine   ? High cholesterol   ? History of blood transfusion   ? History of nuclear stress test   ? Myoview 07/2019: EF 60, normal perfusion, low risk.  ? Hypertension   ? MI, old   ? MRSA (methicillin resistant Staphylococcus aureus)   ? MVP (mitral valve prolapse)   ? Neuromuscular disorder ([place])   ? Neuropathy   ?  S/P mitral valve repair   ? Sebaceous cyst   ? ? ?Past Surgical History:  ?Procedure Laterality Date  ? ABDOMINAL HYSTERECTOMY    ? CARDIAC CATHETERIZATION  09/22/2016  ? CORONARY ARTERY BYPASS GRAFT  with valve repair  ? CORONARY STENT INTERVENTION N/A 09/22/2016  ? Procedure: Coronary Stent Intervention;  Surgeon: Burnell Blanks, MD;  Location: [place] [facility];  Service: Cardiovascular;  Laterality: N/A;  ? CORONARY STENT INTERVENTION N/A 02/07/2017  ? Procedure: CORONARY STENT INTERVENTION;   Surgeon: Martinique, Peter M, MD;  Location: [place] [facility];  Service: Cardiovascular;  Laterality: N/A;  ? CORONARY STENT INTERVENTION N/A 01/30/2020  ? Procedure: CORONARY STENT INTERVENTION;  Surgeon: Burnell Blanks, MD;  Location: [place] [facility];  Service: Cardiovascular;  Laterality: N/A;  ? CORONARY STENT PLACEMENT  09/22/2016  ? LEFT HEART CATH AND CORS/GRAFTS ANGIOGRAPHY N/A 09/22/2016  ? Procedure: Left Heart Cath and Cors/Grafts Angiography;  Surgeon: Burnell Blanks, MD;  Location: [place] [facility];  Service: Cardiovascular;  Laterality: N/A;  ? LEFT HEART CATH AND CORS/GRAFTS ANGIOGRAPHY N/A 02/07/2017  ? Procedure: LEFT HEART CATH AND CORS/GRAFTS ANGIOGRAPHY;  Surgeon: Martinique, Peter M, MD;  Location: [place] [facility];  Service: Cardiovascular;  Laterality: N/A;  ? LEFT HEART CATH AND CORS/GRAFTS ANGIOGRAPHY N/A 01/30/2020  ? Procedure: LEFT HEART CATH AND CORS/GRAFTS ANGIOGRAPHY;  Surgeon: Burnell Blanks, MD;  Location: [place] [facility];  Service: Cardiovascular;  Laterality: N/A;  ? SKIN GRAFT    ? ? ?Current Outpatient Medications  ?Medication Sig Dispense Refill  ? aspirin 81 MG tablet Take 81 mg by mouth daily.     ? carvedilol (COREG) 3.125 MG tablet TAKE 1 TABLET BY MOUTH TWICE A DAY 180 tablet 0  ? clopidogrel (PLAVIX) 75 MG tablet TAKE 1 TABLET BY MOUTH DAILY WITH BREAKFAST. 90 tablet 0  ? Continuous Blood Gluc Sensor (FREESTYLE LIBRE 2 SENSOR) MISC Apply 1 Device topically every 14 (fourteen) days. Wears continuously changes every 14 days    ? gabapentin (NEURONTIN) 300 MG capsule Take 300 mg by mouth 3 (three) times daily.     ? insulin aspart (NOVOLOG) 100 UNIT/ML injection Inject 8-12 Units into the skin See admin instructions. Per sliding scale ?Under 150 no units ?150-199 take 8 units ?200-249 take 10 units ?250-299 take 12 units    ? isosorbide mononitrate (IMDUR) 30 MG 24 hr tablet TAKE 1/2 OF A TABLET (15 MG TOTAL) BY MOUTH DAILY 45 tablet 0  ?  metFORMIN (GLUCOPHAGE-XR) 500 MG 24 hr tablet Take 500 mg by mouth in the morning and at bedtime.     ? MOUNJARO 5 MG/0.5ML Pen Inject 5 mg into the skin once a week.    ? nitroGLYCERIN (NITROSTAT) 0.4 MG SL tablet PLACE 1 TABLET UNDER THE TONGUE EVERY 5 MINUTES AS NEEDED FOR CHEST PAIN (UP TO 3 DOSES/15MIN) 75 tablet 2  ? oxyCODONE-acetaminophen (PERCOCET) 10-325 MG per tablet Take 1 tablet by mouth 5 (five) times daily.     ? pantoprazole (PROTONIX) 40 MG tablet Take 1 tablet (40 mg total) by mouth daily. 30 tablet 11  ? rosuvastatin (CRESTOR) 40 MG tablet TAKE 1 TABLET (40 MG TOTAL) EVERY EVENING BY MOUTH. 90 tablet 3  ? TRESIBA FLEXTOUCH 100 UNIT/ML SOPN FlexTouch Pen Inject 60 Units as directed at bedtime.   4  ? zolpidem (AMBIEN) 5 MG tablet TAKE 1 TABLET BY MOUTH EVERY DAY AT BEDTIME AS NEEDED FOR SLEEP 30 tablet  5  ? ?No current facility-administered medications for this visit.  ? ? ?Allergies  ?Allergen Reactions  ? Metformin Diarrhea and Nausea And Vomiting  ?  Immediate release tablets (not metformin ER, pt currently taking)  ? ? ?Social History  ? ?Socioeconomic History  ? Marital status: Divorced  ?  Spouse name: Not on file  ? Number of children: 2  ? Years of education: Not on file  ? Highest education level: Not on file  ?Occupational History  ? Occupation: Disability  ?Tobacco Use  ? Smoking status: Former  ?  Packs/day: 1.00  ?  Years: 28.00  ?  Pack years: 28.00  ?  Types: Cigarettes  ?  Quit date: 03/31/2009  ?  Years since quitting: 12.3  ? Smokeless tobacco: Never  ?Vaping Use  ? Vaping Use: Never used  ?Substance and Sexual Activity  ? Alcohol use: No  ? Drug use: No  ? Sexual activity: Yes  ?  Birth control/protection: Surgical  ?  Comment: partial hysterectomy  ?Other Topics Concern  ? Not on file  ?Social History Narrative  ? Not on file  ? ?Social Determinants of Health  ? ?Financial Resource Strain: Not on file  ?Food Insecurity: Not on file  ?Transportation Needs: Not on file  ?Physical  Activity: Not on file  ?Stress: Not on file  ?Social Connections: Not on file  ?Intimate Partner Violence: Not on file  ? ? ?Family History  ?Problem Relation Age of Onset  ? Heart disease Father   ? Asthma Fat

## 2021-08-15 ENCOUNTER — Ambulatory Visit (INDEPENDENT_AMBULATORY_CARE_PROVIDER_SITE_OTHER): Payer: Medicare HMO | Admitting: Cardiovascular Disease

## 2021-08-15 ENCOUNTER — Encounter: Payer: Self-pay | Admitting: Cardiovascular Disease

## 2021-08-15 VITALS — BP 130/82 | HR 55 | Ht 64.5 in | Wt 199.4 lb

## 2021-08-15 DIAGNOSIS — E7800 Pure hypercholesterolemia, unspecified: Secondary | ICD-10-CM | POA: Diagnosis not present

## 2021-08-15 DIAGNOSIS — I5032 Chronic diastolic (congestive) heart failure: Secondary | ICD-10-CM

## 2021-08-15 DIAGNOSIS — Z9889 Other specified postprocedural states: Secondary | ICD-10-CM

## 2021-08-15 DIAGNOSIS — Z01810 Encounter for preprocedural cardiovascular examination: Secondary | ICD-10-CM

## 2021-08-15 DIAGNOSIS — I10 Essential (primary) hypertension: Secondary | ICD-10-CM | POA: Diagnosis not present

## 2021-08-15 DIAGNOSIS — I25810 Atherosclerosis of coronary artery bypass graft(s) without angina pectoris: Secondary | ICD-10-CM

## 2021-08-15 LAB — LIPID PANEL
Chol/HDL Ratio: 3.2 ratio (ref 0.0–4.4)
Cholesterol, Total: 152 mg/dL (ref 100–199)
HDL: 47 mg/dL (ref >39)
LDL Chol Calc (NIH): 79 mg/dL (ref 0–99)
Triglycerides: 147 mg/dL (ref 0–149)
VLDL Cholesterol Cal: 26 mg/dL (ref 5–40)

## 2021-08-15 LAB — HEPATIC FUNCTION PANEL
ALT: 20 IU/L (ref 0–32)
AST: 19 IU/L (ref 0–40)
Albumin: 4.9 g/dL (ref 3.8–4.9)
Alkaline Phosphatase: 67 IU/L (ref 44–121)
Bilirubin Total: 0.4 mg/dL (ref 0.0–1.2)
Bilirubin, Direct: 0.12 mg/dL (ref 0.00–0.40)
Total Protein: 7.6 g/dL (ref 6.0–8.5)

## 2021-08-15 MED ORDER — ZOLPIDEM TARTRATE 5 MG PO TABS: ORAL_TABLET | ORAL | 5 refills | Status: DC

## 2021-08-15 NOTE — Patient Instructions (Signed)
Medication Instructions:  ?No changes ?*If you need a refill on your cardiac medications before your next appointment, please call your pharmacy* ? ? ?Lab Work: ?Today: lipids/liver function ?If you have labs (blood work) drawn today and your tests are completely normal, you will receive your results only by: ?MyChart Message (if you have MyChart) OR ?A paper copy in the mail ?If you have any lab test that is abnormal or we need to change your treatment, we will call you to review the results. ? ? ?Testing/Procedures: ?none ? ?Follow-Up: ?At [facility], you and your health needs are our priority.  As part of our continuing mission to provide you with exceptional heart care, we have created designated Provider Care Teams.  These Care Teams include your primary Cardiologist (physician) and Advanced Practice Providers (APPs -  Physician Assistants and Nurse Practitioners) who all work together to provide you with the care you need, when you need it. ? ? ?Your next appointment:   ?12 month(s) ? ?The format for your next appointment:   ?In Person ? ?Provider:   ?Lauree Chandler, MD { ? ? ?Important Information About Sugar ? ? ? ? ?  ?

## 2021-08-18 ENCOUNTER — Telehealth: Payer: Self-pay | Admitting: *Deleted

## 2021-08-18 DIAGNOSIS — I25810 Atherosclerosis of coronary artery bypass graft(s) without angina pectoris: Secondary | ICD-10-CM

## 2021-08-18 DIAGNOSIS — E7800 Pure hypercholesterolemia, unspecified: Secondary | ICD-10-CM

## 2021-08-18 MED ORDER — EZETIMIBE 10 MG PO TABS: 10.0000 mg | ORAL_TABLET | Freq: Every day | ORAL | 3 refills | Status: DC

## 2021-08-18 NOTE — Telephone Encounter (Signed)
-----   Message from Burnell Blanks, MD sent at 08/16/2021  1:25 PM EDT ----- LDL is 79, slightly above goal of 70. Can we see if she is willing to also start Zetia 10 mg daily? She is on Crestor 40 mg daily. cdm

## 2021-08-18 NOTE — Telephone Encounter (Signed)
Reviewed results with patient.   She is taking Crestor 40 mg daily and will add Zetia 10 mg daily.  She will return in 12 weeks for repeat lipids.

## 2021-08-25 ENCOUNTER — Other Ambulatory Visit: Payer: Self-pay | Admitting: Cardiovascular Disease

## 2021-09-22 ENCOUNTER — Other Ambulatory Visit: Payer: Self-pay | Admitting: Cardiovascular Disease

## 2021-10-18 ENCOUNTER — Emergency Department (HOSPITAL_BASED_OUTPATIENT_CLINIC_OR_DEPARTMENT_OTHER)
Admission: EM | Admit: 2021-10-18 | Discharge: 2021-10-18 | Disposition: A | Discharge status: Home / Self Care | Payer: Medicare HMO | Attending: Emergency Medicine | Admitting: Emergency Medicine

## 2021-10-18 ENCOUNTER — Other Ambulatory Visit: Payer: Self-pay

## 2021-10-18 ENCOUNTER — Encounter (HOSPITAL_BASED_OUTPATIENT_CLINIC_OR_DEPARTMENT_OTHER): Payer: Self-pay | Admitting: Pediatrics

## 2021-10-18 ENCOUNTER — Emergency Department (HOSPITAL_BASED_OUTPATIENT_CLINIC_OR_DEPARTMENT_OTHER): Payer: Medicare HMO

## 2021-10-18 DIAGNOSIS — Z79899 Other long term (current) drug therapy: Secondary | ICD-10-CM | POA: Diagnosis not present

## 2021-10-18 DIAGNOSIS — I10 Essential (primary) hypertension: Secondary | ICD-10-CM | POA: Diagnosis not present

## 2021-10-18 DIAGNOSIS — Z7982 Long term (current) use of aspirin: Secondary | ICD-10-CM | POA: Insufficient documentation

## 2021-10-18 DIAGNOSIS — Z7984 Long term (current) use of oral hypoglycemic drugs: Secondary | ICD-10-CM | POA: Diagnosis not present

## 2021-10-18 DIAGNOSIS — R079 Chest pain, unspecified: Secondary | ICD-10-CM

## 2021-10-18 DIAGNOSIS — I160 Hypertensive urgency: Secondary | ICD-10-CM | POA: Insufficient documentation

## 2021-10-18 DIAGNOSIS — Z794 Long term (current) use of insulin: Secondary | ICD-10-CM | POA: Insufficient documentation

## 2021-10-18 DIAGNOSIS — E119 Type 2 diabetes mellitus without complications: Secondary | ICD-10-CM | POA: Diagnosis not present

## 2021-10-18 LAB — TROPONIN I (HIGH SENSITIVITY)
Troponin I (High Sensitivity): 6 ng/L (ref <18)
Troponin I (High Sensitivity): 7 ng/L (ref <18)

## 2021-10-18 LAB — CBC
HCT: 44.2 % (ref 36.0–46.0)
Hemoglobin: 15.4 g/dL — ABNORMAL HIGH (ref 12.0–15.0)
MCH: 31.8 pg (ref 26.0–34.0)
MCHC: 34.8 g/dL (ref 30.0–36.0)
MCV: 91.3 fL (ref 80.0–100.0)
Platelets: 365 10*3/uL (ref 150–400)
RBC: 4.84 MIL/uL (ref 3.87–5.11)
RDW: 12.4 % (ref 11.5–15.5)
WBC: 12.2 10*3/uL — ABNORMAL HIGH (ref 4.0–10.5)
nRBC: 0 % (ref 0.0–0.2)

## 2021-10-18 LAB — BASIC METABOLIC PANEL
Anion gap: 11 (ref 5–15)
BUN: 13 mg/dL (ref 6–20)
CO2: 22 mmol/L (ref 22–32)
Calcium: 10.5 mg/dL — ABNORMAL HIGH (ref 8.9–10.3)
Chloride: 104 mmol/L (ref 98–111)
Creatinine, Ser: 0.93 mg/dL (ref 0.44–1.00)
GFR, Estimated: >60 mL/min (ref >60)
Glucose, Bld: 167 mg/dL — ABNORMAL HIGH (ref 70–99)
Potassium: 5 mmol/L (ref 3.5–5.1)
Sodium: 137 mmol/L (ref 135–145)

## 2021-10-18 MED ORDER — ONDANSETRON HCL 4 MG/2ML IJ SOLN
4.0000 mg | Freq: Once | INTRAMUSCULAR | Status: AC
  Administered 2021-10-18: 4 mg via INTRAVENOUS
  Filled 2021-10-18: qty 2

## 2021-10-18 MED ORDER — ONDANSETRON HCL 4 MG PO TABS: 4.0000 mg | ORAL_TABLET | Freq: Four times a day (QID) | ORAL | 0 refills | Status: DC

## 2021-10-18 NOTE — ED Provider Notes (Signed)
[facility] Provider Note   CSN: [id number] Arrival date & time: 10/18/21  1922     History  Chief Complaint  Patient presents with   Chest Pain    Elizabeth Park is a 51 y.o. female.  Pt is a 51 yo female with pmh of HTN, hyperlipidemia, cardiac stents, and DM presenting for left sided chest pain under the breast with radiation to the left back that started Saturday. Pain wias intermittent but then became severe today with pain on the left upper chest and associated nausea. No diaphoresis. No fever, chills, coughing. No sob or leg swelling.    Chest Pain Associated symptoms: no abdominal pain, no back pain, no cough, no fever, no palpitations, no shortness of breath and no vomiting        Home Medications Prior to Admission medications   Medication Sig Start Date End Date Taking? Authorizing Provider  aspirin 81 MG tablet Take 81 mg by mouth daily.  11/02/11   Hillary Bow, MD  carvedilol (COREG) 3.125 MG tablet TAKE 1 TABLET BY MOUTH TWICE A DAY 08/25/21   Burnell Blanks, MD  clopidogrel (PLAVIX) 75 MG tablet TAKE 1 TABLET BY MOUTH EVERY DAY WITH BREAKFAST 09/22/21   Burnell Blanks, MD  Continuous Blood Gluc Sensor (FREESTYLE LIBRE 2 SENSOR) MISC Apply 1 Device topically every 14 (fourteen) days. Wears continuously changes every 14 days    [provider]  ezetimibe (ZETIA) 10 MG tablet Take 1 tablet (10 mg total) by mouth daily. 08/18/21   Burnell Blanks, MD  gabapentin (NEURONTIN) 300 MG capsule Take 300 mg by mouth 3 (three) times daily.  10/25/18   [provider]  insulin aspart (NOVOLOG) 100 UNIT/ML injection Inject 8-12 Units into the skin See admin instructions. Per sliding scale Under 150 no units 150-199 take 8 units 200-249 take 10 units 250-299 take 12 units    [provider]  isosorbide mononitrate (IMDUR) 30 MG 24 hr tablet TAKE 1/2 OF A TABLET (15 MG TOTAL) BY MOUTH DAILY 08/25/21    Burnell Blanks, MD  metFORMIN (GLUCOPHAGE-XR) 500 MG 24 hr tablet Take 500 mg by mouth in the morning and at bedtime.  10/13/18   [provider]  MOUNJARO 5 MG/0.5ML Pen Inject 5 mg into the skin once a week. 07/05/21   [provider]  nitroGLYCERIN (NITROSTAT) 0.4 MG SL tablet PLACE 1 TABLET UNDER THE TONGUE EVERY 5 MINUTES AS NEEDED FOR CHEST PAIN (UP TO 3 DOSES/15MIN) 10/25/20   Burnell Blanks, MD  oxyCODONE-acetaminophen (PERCOCET) 10-325 MG per tablet Take 1 tablet by mouth 5 (five) times daily.     [provider]  pantoprazole (PROTONIX) 40 MG tablet Take 1 tablet (40 mg total) by mouth daily. 05/29/19   Burnell Blanks, MD  rosuvastatin (CRESTOR) 40 MG tablet TAKE 1 TABLET (40 MG TOTAL) EVERY EVENING BY MOUTH. 09/01/20   Burnell Blanks, MD  TRESIBA FLEXTOUCH 100 UNIT/ML SOPN FlexTouch Pen Inject 60 Units as directed at bedtime.  08/13/16   [provider]  zolpidem (AMBIEN) 5 MG tablet TAKE 1 TABLET BY MOUTH EVERY DAY AT BEDTIME AS NEEDED FOR SLEEP 08/15/21   Burnell Blanks, MD      Allergies    Metformin    Review of Systems   Review of Systems  Constitutional:  Negative for chills and fever.  HENT:  Negative for ear pain and sore throat.   Eyes:  Negative for pain and visual disturbance.  Respiratory:  Negative for cough and shortness of breath.   Cardiovascular:  Positive for chest pain. Negative for palpitations.  Gastrointestinal:  Negative for abdominal pain and vomiting.  Genitourinary:  Negative for dysuria and hematuria.  Musculoskeletal:  Negative for arthralgias and back pain.  Skin:  Negative for color change and rash.  Neurological:  Negative for seizures and syncope.  All other systems reviewed and are negative.   Physical Exam Updated Vital Signs BP (!) 144/72   Pulse (!) 59   Temp 98.4 F (36.9 C) (Oral)   Resp 16   Ht '5\' 4"'$  (1.626 m)   Wt 90.7 kg   SpO2 97%   BMI 34.33 kg/m   Physical Exam Vitals and nursing note reviewed.  Constitutional:      General: She is not in acute distress.    Appearance: She is well-developed.  HENT:     Head: Normocephalic and atraumatic.  Eyes:     Conjunctiva/sclera: Conjunctivae normal.  Cardiovascular:     Rate and Rhythm: Normal rate and regular rhythm.     Heart sounds: No murmur heard. Pulmonary:     Effort: Pulmonary effort is normal. No respiratory distress.     Breath sounds: Normal breath sounds.  Abdominal:     Palpations: Abdomen is soft.     Tenderness: There is no abdominal tenderness.  Musculoskeletal:        General: No swelling.     Cervical back: Neck supple.     Right lower leg: No edema.     Left lower leg: No edema.  Skin:    General: Skin is warm and dry.     Capillary Refill: Capillary refill takes less than 2 seconds.  Neurological:     Mental Status: She is alert.  Psychiatric:        Mood and Affect: Mood normal.     ED Results / Procedures / Treatments   Labs (all labs ordered are listed, but only abnormal results are displayed) Labs Reviewed  BASIC METABOLIC PANEL - Abnormal; Notable for the following components:      Result Value   Glucose, Bld 167 (*)    Calcium 10.5 (*)    All other components within normal limits  CBC - Abnormal; Notable for the following components:   WBC 12.2 (*)    Hemoglobin 15.4 (*)    All other components within normal limits  PREGNANCY, URINE  TROPONIN I (HIGH SENSITIVITY)  TROPONIN I (HIGH SENSITIVITY)    EKG EKG Interpretation  Date/Time:  Tuesday October 18 2021 19:36:40 EDT Ventricular Rate:  63 PR Interval:  148 QRS Duration: 78 QT Interval:  422 QTC Calculation: 431 R Axis:   -28 Text Interpretation: Normal sinus rhythm Cannot rule out Anterior infarct , age undetermined Abnormal ECG When compared with ECG of 30-Jan-2020 08:59, PREVIOUS ECG IS PRESENT Confirmed by Campbell Stall ([id number]) on 10/04/5007 8:08:33 PM  Radiology DG Chest 2  View  Result Date: 10/18/2021 CLINICAL DATA:  Chest pain. EXAM: CHEST - 2 VIEW COMPARISON:  Radiograph 02/06/2017 FINDINGS: Mild cardiomegaly. Stable mediastinal contours. Coronary stent is visualized. Prior valvuloplasty. No pulmonary edema, pleural effusion, pneumothorax or confluent airspace disease. No acute osseous findings. IMPRESSION: Mild cardiomegaly. No acute pulmonary process or evidence of congestive failure. Electronically Signed   By: Keith Rake M.D.   On: 10/18/2021 20:17    Procedures Procedures    Medications Ordered in ED Medications  ondansetron (ZOFRAN)  injection 4 mg (4 mg Intravenous Given 10/18/21 2114)    ED Course/ Medical Decision Making/ A&P                           Medical Decision Making Amount and/or Complexity of Data Reviewed Labs: ordered. Radiology: ordered.  Risk Prescription drug management.   .10:26 PM 51 yo female with pmh of HTN, hyperlipidemia, cardiac stents, and DM presenting for left sided chest pain under the breast with radiation to the left back that started Saturday.  The patient's chest pain is not suggestive of pulmonary embolus, cardiac ischemia, aortic dissection, pericarditis, myocarditis, pulmonary embolism, pneumothorax, pneumonia, Zoster, or esophageal perforation, or other serious etiology.  Historically not abrupt in onset, tearing or ripping, pulses symmetric. EKG nonspecific for ischemia/infarction. No dysrhythmias, brugada, WPW, prolonged QT noted. [CXR reviewed and WNL] [Troponin negative x2. CXR reviewed. Labs without demonstration of acute pathology unless otherwise noted above.    Low HEART Score: 5. Pt is chest pain free at this time. Recommended for prompt follow up with cardiologist for stress test. Ambulatory referral placed.  Patient in no distress and overall condition improved here in the ED. Detailed discussions were had with the patient regarding current findings, and need for close f/u with PCP or on call  doctor. The patient has been instructed to return immediately if the symptoms worsen in any way for re-evaluation. Patient verbalized understanding and is in agreement with current care plan. All questions answered prior to discharge.         Final Clinical Impression(s) / ED Diagnoses Final diagnoses:  Chest pain, unspecified type  Hypertensive urgency    Rx / DC Orders ED Discharge Orders          Ordered    Ambulatory referral to Cardiology       Comments: Unstable angina, heart score 5, chest pain resolved in ED, stable ECG and trops   10/18/21 2226              Lianne Cure, DO 61/95/09 2226

## 2021-10-18 NOTE — ED Notes (Signed)
Patient transported to x-ray. ?

## 2021-10-18 NOTE — ED Triage Notes (Signed)
C/O chest pain that started Friday while she was at the grocery store; and been feeling tired, headache and nausea since then; reports hx of heart stent x7;

## 2021-10-27 ENCOUNTER — Other Ambulatory Visit: Payer: Self-pay | Admitting: Cardiovascular Disease

## 2021-11-02 NOTE — Progress Notes (Addendum)
Cardiology Office Note    Date:  11/04/2021   ID:  Elizabeth Park, DOB [birth year], MRN [id number]  PCP:  Secundino Ginger, PA-C  Cardiologist:  Lauree Chandler, MD  Electrophysiologist:  None   Chief Complaint: f/u ER visit for chest pain  History of Present Illness:   Elizabeth Park is a 51 y.o. female with history of CAD s/p 4V CABG 2011 and mitral valve repair for severe mitral regurgitation, DESx3 in 09/2016, DES to ramus intermediate in 02/2017, DESx2 to intermediate branch and distal RCA through the SVG-RCA in 01/2020, DM, HTN, HLD, former tobacco abuse, chronic diastolic CHF who is seen for post-ER follow-up for chest pain.  To recap, cardiac cath in 03/2009 by Dr. [name] showed MVCAD promting 4V CABG  (LIMA to LAD, SVG to subbranch of ramus intermediate, SVG to lateral subbranch of ramus intermediate, SVG to distal RCA) and mitral valve repair January 2011. She had chest pain in June 2018 that felt unlike prior angina but her nuclear stress test suggested ischemia. She underwent cardiac cath on 09/22/16 which showed progression of her CAD with resultant drug eluting stent to the vein graft to the PDA, a drug eluting stent in the native ramus intermediate branch and a drug eluting stent in the protected left main. In 02/2017 she developed recurrent chest pain with mixed features. Initially elective heart cath was arranged but outpatient troponin was positive so she was asked to present to the ED that night. Cardiac cath 02/07/17 showed severe 3 vessel disease, patent LIMA-LAD, occluded SVG-ramus x 2, patent SVG-dRCA, normal LVEDP, s/p DES to ramus intermediate. Last cath was in 01/2020 as outlined below with DESx2 to intermediate branch and distal RCA through the SVG-RCA in 01/2020. Dr. Angelena Form has previously recommended lifelong DAPT given her multiple stents. Amlodipine was previously stopped due to soft BP. Coreg was later further adjusted due to fatigue as well. She had normal ABI for leg  pain in 02/2017 (ordered by podiatrist).   She was recently seen in the ED 10/18/21 for chest pain and had negative troponins and nonacute CXR. She was able to be discharged home with OP follow-up. She reports that the preceding Friday she was at the grocery store and developed a focal pain under her left breast that wrapped around to her back. It persisted on and off as "blips" or "taps" for several hours but the discomfort itself was only a second or two. She had done two physically exerting activities she thinks may have provoked the muscle - she had bent over to put air in a tire, and while at the grocery store, she turned around very quickly to grab a cart. Saturday/Sunday she felt OK. On Monday she felt nauseated on and off. On Tuesday she had some vomiting as well as feeling lightheaded, anxious, accompanied by return of some of the back aching. She went to the ER where workup was felt reassuring. She reports she's been under a lot of family stress planning a party in [place] and potentially taking on the responsibility of helping [place] family members needing to be placed in another's care. She thinks this played a role in symptoms. Since ER visit she has felt completely fine with no recurrent symptoms whatsoever, including no cardiac symptoms with exertion. She vehemently emphasizes that this was nothing like her prior angina. She has mammoplasty slated for October. She had hand surgery earlier this year and did well with this.  Labwork independently reviewed: 10/2021 troponins neg x2,  Hgb 15.4, plt 365, K 5.0, Cr 0.93, calcium 10.5 08/2021 LFTs ok, LDL 79 2020 TSH wnl   Cardiology Studies:   Studies reviewed are outlined and summarized above. Reports included below if pertinent.   Cath 01/2020 Previously placed LM drug eluting stent is widely patent. Balloon angioplasty was performed. Previously placed Ost Ramus to Ramus drug eluting stent is widely patent. Balloon angioplasty was performed. Lat  Ramus lesion is 100% stenosed. Prox RCA lesion is 100% stenosed. Dist RCA lesion is 100% stenosed. SVG and is normal in caliber. Previously placed Origin to Prox Graft drug eluting stent is widely patent. SVG. Mid Graft lesion is 100% stenosed. SVG. Origin lesion is 100% stenosed. LIMA and is normal in caliber. RPAV lesion is 99% stenosed. A drug-eluting stent was successfully placed using a STENT RESOLUTE ONYX 2.5X12. Post intervention, there is a 0% residual stenosis. 1st Diag lesion is 99% stenosed. Previously placed Dist LM to Prox LAD stent (unknown type) is widely patent. SVG graft was not injected. Origin to Prox Graft lesion is 100% stenosed. A drug-eluting stent was successfully placed using a SYNERGY XD 2.50X12. Post intervention, there is a 0% residual stenosis.   1. Severe triple vessel CAD s/p 4/4 CABG with 2/4 patent grafts 2. 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 3. The RCA is a large dominant vessel with chronic proximal total occlusion. The vein graft to the distal RCA is patent with a patent stent in the proximal body of the graft. Severe stenosis native RCA in the posterolateral branch filling via the vein graft.  4. Successful PTCA/DES x 1 posterolateral artery through the vein graft.  5. The intermediate is a large branch with a long proximal stented segment. The distal end of the stented segment has severe restenosis.  6. Successful PTCA/DES x 1 mid intermediate branch.  7.  The origin of the left vessel is essentially a long segment that gives rise to the LAD proper and intermediate branch. The proximal segment of this vessel has a patent stented segment. The LAD is protected by the LIMA graft.    Recommendations: Continue current therapy. Continue DAPT   11/2018 2D echo   1. The left ventricle has normal systolic function with an ejection  fraction of 60-65%. The cavity size was normal. Left ventricular  diastolic  Doppler parameters are indeterminate. There is abnormal septal motion  consistent with post-operative status.   2. The right ventricle has normal systolic function. The cavity was  normal. There is no increase in right ventricular wall thickness. Right  ventricular systolic pressure TR inadequate for RVSP assessment.   3. A Annuloplasty ring present valve is present in the mitral position.  Procedure Date: 2011.   4. Mild mitral valve stenosis.   5. S/p mitral valve annuloplasty. Mean gradient across valve ~4.3 mmHG @  HR 65 bpm. Gradients similar to prior study.   6. The tricuspid valve is grossly normal.   7. The aortic valve is tricuspid. No stenosis of the aortic valve.   8. The aorta is normal unless otherwise noted.   9. The aortic root and ascending aorta are normal in size and structure.  10. When compared to the prior study: No significant change from prior  study (12/04/2014).   ABI 02/2017  Final Interpretation:  Right: Resting right ankle-brachial index is within normal range. No  evidence of significant right lower extremity arterial disease. The right  toe-brachial index is normal. PPG  tracings appear dampened.  Left: Resting left ankle-brachial index is within normal range. No  evidence of significant left lower extremity arterial disease. The left  toe-brachial index is normal. PPG tracings appear dampened.     *See table(s) above for measurements and observations.      Electronically signed by Quay Burow on 02/21/2017 at 2:20:57 PM.         Past Medical History:  Diagnosis Date   Chronic diastolic CHF (congestive heart failure) (HCC)    Coronary artery disease    a. 4v CABG  (LIMA to LAD, SVG to subbranch of ramus intermediate, SVG to lateral subbranch of ramus intermediate, SVG to distal RCA) and mitral valve repair January 2011. b. Abnormal nuc 09/2016 s/p  drug eluting stent to the vein graft to the PDA, a drug eluting stent in the native Ramus  intermediate branch and a drug eluting stent in the protected left main. c. DES to ramus intermediate 02/2017.    Diabetes mellitus    H/O emotional problems    Headache, migraine    High cholesterol    History of blood transfusion    History of nuclear stress test    Myoview 07/2019: EF 60, normal perfusion, low risk.   Hypertension    MI, old    MRSA (methicillin resistant Staphylococcus aureus)    MVP (mitral valve prolapse)    Neuromuscular disorder (HCC)    Neuropathy    S/P mitral valve repair    Sebaceous cyst     Past Surgical History:  Procedure Laterality Date   ABDOMINAL HYSTERECTOMY     CARDIAC CATHETERIZATION  09/22/2016   CORONARY ARTERY BYPASS GRAFT  with valve repair   CORONARY STENT INTERVENTION N/A 09/22/2016   Procedure: Coronary Stent Intervention;  Surgeon: Burnell Blanks, MD;  Location: [place] [facility];  Service: Cardiovascular;  Laterality: N/A;   CORONARY STENT INTERVENTION N/A 02/07/2017   Procedure: CORONARY STENT INTERVENTION;  Surgeon: Martinique, Peter M, MD;  Location: [place] [facility];  Service: Cardiovascular;  Laterality: N/A;   CORONARY STENT INTERVENTION N/A 01/30/2020   Procedure: CORONARY STENT INTERVENTION;  Surgeon: Burnell Blanks, MD;  Location: [place] [facility];  Service: Cardiovascular;  Laterality: N/A;   CORONARY STENT PLACEMENT  09/22/2016   LEFT HEART CATH AND CORS/GRAFTS ANGIOGRAPHY N/A 09/22/2016   Procedure: Left Heart Cath and Cors/Grafts Angiography;  Surgeon: Burnell Blanks, MD;  Location: [place] [facility];  Service: Cardiovascular;  Laterality: N/A;   LEFT HEART CATH AND CORS/GRAFTS ANGIOGRAPHY N/A 02/07/2017   Procedure: LEFT HEART CATH AND CORS/GRAFTS ANGIOGRAPHY;  Surgeon: Martinique, Peter M, MD;  Location: [place] [facility];  Service: Cardiovascular;  Laterality: N/A;   LEFT HEART CATH AND CORS/GRAFTS ANGIOGRAPHY N/A 01/30/2020   Procedure: LEFT HEART CATH AND CORS/GRAFTS ANGIOGRAPHY;  Surgeon:  Burnell Blanks, MD;  Location: [place] [facility];  Service: Cardiovascular;  Laterality: N/A;   SKIN GRAFT      Current Medications: Current Meds  Medication Sig   aspirin 81 MG tablet Take 81 mg by mouth daily.    carvedilol (COREG) 3.125 MG tablet TAKE 1 TABLET BY MOUTH TWICE A DAY   clopidogrel (PLAVIX) 75 MG tablet TAKE 1 TABLET BY MOUTH EVERY DAY WITH BREAKFAST   Continuous Blood Gluc Sensor (FREESTYLE LIBRE 2 SENSOR) MISC Apply 1 Device topically every 14 (fourteen) days. Wears continuously changes every 14 days   ezetimibe (ZETIA) 10 MG tablet Take 1 tablet (10 mg total)  by mouth daily.   gabapentin (NEURONTIN) 300 MG capsule Take 300 mg by mouth 3 (three) times daily.    insulin aspart (NOVOLOG) 100 UNIT/ML injection Inject 8-12 Units into the skin See admin instructions. Per sliding scale Under 150 no units 150-199 take 8 units 200-249 take 10 units 250-299 take 12 units   isosorbide mononitrate (IMDUR) 30 MG 24 hr tablet TAKE 1/2 OF A TABLET (15 MG TOTAL) BY MOUTH DAILY   metFORMIN (GLUCOPHAGE-XR) 500 MG 24 hr tablet Take 500 mg by mouth in the morning and at bedtime.    MOUNJARO 5 MG/0.5ML Pen Inject 5 mg into the skin once a week.   nitroGLYCERIN (NITROSTAT) 0.4 MG SL tablet PLACE 1 TABLET UNDER THE TONGUE EVERY 5 MINUTES AS NEEDED FOR CHEST PAIN (UP TO 3 DOSES/15MIN)   ondansetron (ZOFRAN) 4 MG tablet Take 1 tablet (4 mg total) by mouth every 6 (six) hours.   oxyCODONE-acetaminophen (PERCOCET) 10-325 MG per tablet Take 1 tablet by mouth 5 (five) times daily.    pantoprazole (PROTONIX) 40 MG tablet Take 1 tablet (40 mg total) by mouth daily.   rosuvastatin (CRESTOR) 40 MG tablet TAKE 1 TABLET (40 MG TOTAL) EVERY EVENING BY MOUTH.   TRESIBA FLEXTOUCH 100 UNIT/ML SOPN FlexTouch Pen Inject 60 Units as directed at bedtime.    zolpidem (AMBIEN) 5 MG tablet TAKE 1 TABLET BY MOUTH EVERY DAY AT BEDTIME AS NEEDED FOR SLEEP      Allergies:   Metformin   Social History    Socioeconomic History   Marital status: Divorced    Spouse name: Not on file   Number of children: 2   Years of education: Not on file   Highest education level: Not on file  Occupational History   Occupation: Disability  Tobacco Use   Smoking status: Former    Packs/day: 1.00    Years: 28.00    Total pack years: 28.00    Types: Cigarettes    Quit date: 03/31/2009    Years since quitting: 12.6   Smokeless tobacco: Never  Vaping Use   Vaping Use: Never used  Substance and Sexual Activity   Alcohol use: No   Drug use: No   Sexual activity: Yes    Birth control/protection: Surgical    Comment: partial hysterectomy  Other Topics Concern   Not on file  Social History Narrative   Not on file   Social Determinants of Health   Financial Resource Strain: Not on file  Food Insecurity: Not on file  Transportation Needs: No Transportation Needs (04/10/2017)   PRAPARE - Hydrologist (Medical): No    Lack of Transportation (Non-Medical): No  Physical Activity: Inactive (04/10/2017)   Exercise Vital Sign    Days of Exercise per Week: 0 days    Minutes of Exercise per Session: 0 min  Stress: No Stress Concern Present (04/10/2017)   [place]    Feeling of Stress : Only a little  Social Connections: Not on file     Family History:  The patient's family history includes Arthritis in her father; Asthma in her father; COPD in her father; Diabetes in her brother, father, and mother; Emphysema in her maternal grandmother; Heart disease in her father and mother; Hypertension in her brother, father, and mother; Migraines in her mother; Stroke in her mother. There is no history of Breast cancer.  ROS:   Please see the history of present  illness. All other systems are reviewed and otherwise negative.    EKG(s)/Additional Labs   EKG:  EKG is not ordered today but reviewed from recent ER visit  with NSR 63bpm nonspecific STTW changes similar to prior  Recent Labs: 08/15/2021: ALT 20 10/18/2021: BUN 13; Creatinine, Ser 0.93; Hemoglobin 15.4; Platelets 365; Potassium 5.0; Sodium 137  Recent Lipid Panel    Component Value Date/Time   CHOL 152 08/15/2021 1055   TRIG 147 08/15/2021 1055   HDL 47 08/15/2021 1055   CHOLHDL 3.2 08/15/2021 1055   CHOLHDL 8.9 04/03/2009 0320   VLDL 54 (H) 04/03/2009 0320   LDLCALC 79 08/15/2021 1055    PHYSICAL EXAM:    VS:  BP 132/62   Pulse 60   Ht '5\' 4"'$  (1.626 m)   Wt 204 lb 6.4 oz (92.7 kg)   SpO2 98%   BMI 35.09 kg/m   BMI: Body mass index is 35.09 kg/m.  GEN: Well nourished, well developed female in no acute distress HEENT: normocephalic, atraumatic Neck: no JVD, carotid bruits, or masses Cardiac: RRR; no murmurs, rubs, or gallops, no edema  Respiratory:  clear to auscultation bilaterally, normal work of breathing GI: soft, nontender, nondistended, + BS MS: no deformity or atrophy Skin: warm and dry, no rash Neuro:  Alert and Oriented x 3, Strength and sensation are intact, follows commands Psych: euthymic mood, full affect  Wt Readings from Last 3 Encounters:  11/04/21 204 lb 6.4 oz (92.7 kg)  10/18/21 200 lb (90.7 kg)  08/15/21 199 lb 6.4 oz (90.4 kg)     ASSESSMENT & PLAN:   1. Chest pain, known CAD, HLD goal LDL <70 - symptoms atypical. She is adamant that this is unlike prior angina and felt like it was provoked by combination of stress, potentially coming down with something, +/- MSK pain from twisting/bending. She has felt fine ever since the ER visit.  Troponins remained negative despite spectrum of symptoms across several days. Ms. Blanchette has been very reliable in the past at being able to discern when her chest pain is anginal in nature. Therefore will hold off ischemic eval but plan to pursue updated echocardiogram given the episode of nausea, lightheadedness, and constellation of symptoms to ensure LVEF and valve looks OK  - especially since due to have mammoplasty surgery in October. I will plan to see her back after echo and if she remains asymptomatic and echo looks OK, will plan to clear her for this. Will plan to reference prior anticoag rec as well. Reviewed plan with Dr. Angelena Form who agrees. She has not been fasting today. When she comes in for her echo, will repeat CMET/lipid profile. Recommend attention to calcium level as well given mild elevation in the past.  2. H/o MV repair - mild MS by echo 2020. Follow up by echo as above. SBE ppx reviewed. Patient gets rx from dentist.  3. Essential HTN - BP top normal today, will follow for now and re-evaluate on follow-up. Reports increased stress lately. Continue carvedilol, Imdur.  4. Chronic diastolic CHF - appears euvolemic on exam. Has not required standing diuretic.   5. Leukocytosis - may have been due to n/v. Will recheck when she gets her labs above.    Disposition: F/u with myself after echo.   Medication Adjustments/Labs and Tests Ordered: Current medicines are reviewed at length with the patient today.  Concerns regarding medicines are outlined above. Medication changes, Labs and Tests ordered today are summarized above and listed in  the Patient Instructions accessible in Encounters.   Signed, Charlie Pitter, PA-C  11/04/2021 3:51 PM    [place] Phone: [phone]; Fax: [phone]

## 2021-11-04 ENCOUNTER — Ambulatory Visit (INDEPENDENT_AMBULATORY_CARE_PROVIDER_SITE_OTHER): Payer: Medicare HMO | Admitting: Physician Assistant

## 2021-11-04 ENCOUNTER — Encounter: Payer: Self-pay | Admitting: Physician Assistant

## 2021-11-04 VITALS — BP 132/62 | HR 60 | Ht 64.0 in | Wt 204.4 lb

## 2021-11-04 DIAGNOSIS — I10 Essential (primary) hypertension: Secondary | ICD-10-CM

## 2021-11-04 DIAGNOSIS — R079 Chest pain, unspecified: Secondary | ICD-10-CM | POA: Diagnosis not present

## 2021-11-04 DIAGNOSIS — Z9889 Other specified postprocedural states: Secondary | ICD-10-CM

## 2021-11-04 DIAGNOSIS — E785 Hyperlipidemia, unspecified: Secondary | ICD-10-CM | POA: Diagnosis not present

## 2021-11-04 DIAGNOSIS — D72829 Elevated white blood cell count, unspecified: Secondary | ICD-10-CM

## 2021-11-04 DIAGNOSIS — I2510 Atherosclerotic heart disease of native coronary artery without angina pectoris: Secondary | ICD-10-CM | POA: Diagnosis not present

## 2021-11-04 DIAGNOSIS — I5032 Chronic diastolic (congestive) heart failure: Secondary | ICD-10-CM

## 2021-11-04 NOTE — Patient Instructions (Addendum)
Medication Instructions:  Your physician recommends that you continue on your current medications as directed. Please refer to the Current Medication list given to you today. *If you need a refill on your cardiac medications before your next appointment, please call your pharmacy*   Lab Work: Complete on same day as ECHO- CMET, LIPIDS, CBC (FASTING LABWORK) If you have labs (blood work) drawn today and your tests are completely normal, you will receive your results only by: [place] (if you have MyChart) OR A paper copy in the mail If you have any lab test that is abnormal or we need to change your treatment, we will call you to review the results.   Testing/Procedures: Your physician has requested that you have an echocardiogram. Echocardiography is a painless test that uses sound waves to create images of your heart. It provides your doctor with information about the size and shape of your heart and how well your heart's chambers and valves are working. This procedure takes approximately one hour. There are no restrictions for this procedure.    Follow-Up: At [facility], you and your health needs are our priority.  As part of our continuing mission to provide you with exceptional heart care, we have created designated Provider Care Teams.  These Care Teams include your primary Cardiologist (physician) and Advanced Practice Providers (APPs -  Physician Assistants and Nurse Practitioners) who all work together to provide you with the care you need, when you need it.  We recommend signing up for the patient portal called "MyChart".  Sign up information is provided on this After Visit Summary.  MyChart is used to connect with patients for Virtual Visits (Telemedicine).  Patients are able to view lab/test results, encounter notes, upcoming appointments, etc.  Non-urgent messages can be sent to your provider as well.   To learn more about what you can do with MyChart, go to  [email or web address].    Your next appointment:   AFTER ECHO  The format for your next appointment:   In Person  Provider:   Melina Copa, PA-C        Other Instructions: Endocarditis Information  You may be at risk for developing endocarditis since you have an artificial heart valve or a repaired heart valve. Endocarditis is an infection of the lining of the heart or heart valves. Certain surgical and dental procedures may put you at risk, such as teeth cleaning or other dental procedures or other medical procedures. Notify our office or your dentist before having any dental work or invasive/surgical procedures. You will need to take antibiotics before certain procedures. To prevent endocarditis, maintain good oral health. Seek prompt medical attention for any mouth/gum, skin or urinary tract infections.  Important Information About Sugar

## 2021-11-15 ENCOUNTER — Telehealth: Payer: Self-pay | Admitting: Cardiovascular Disease

## 2021-11-15 NOTE — Telephone Encounter (Signed)
FollowUp;       Whitney from [facility] called.. She wanted to know if Dr Angelena Form is aware that patient is taking Percocet along with Ambien?

## 2021-11-15 NOTE — Telephone Encounter (Signed)
[facility] is calling to give a verbal ok  to a triage number

## 2021-11-15 NOTE — Telephone Encounter (Signed)
See other phone encounter.  

## 2021-11-15 NOTE — Telephone Encounter (Signed)
Notified the pharmacy that the MD is out of the office. If his nurse can get an update before then she will call back with that.  Whitney noted they were holding the Ambien until the office calls back with an update from MD.

## 2021-11-17 NOTE — Telephone Encounter (Signed)
Caller is following up on the status of the patient's zolpidem (AMBIEN) 5 MG tablet prescription.  Caller noted this their second request.

## 2021-11-17 NOTE — Telephone Encounter (Signed)
Called back and spoke w Mia.  The Ambien prescription is a refill that they have filled before, and they also fill her Percocet.  Neither medication is new for the patient.  This is a new regulatory requirement for CNS depressants that it is noted that prescriber is aware she takes both medications and is okay with it.  I adv that they can fill the medication as prescribed by Dr. Angelena Form and that I would forward him the information.

## 2021-11-18 ENCOUNTER — Other Ambulatory Visit: Payer: Medicare HMO

## 2021-11-18 ENCOUNTER — Ambulatory Visit (HOSPITAL_COMMUNITY): Payer: Medicare HMO | Attending: Cardiology

## 2021-11-18 DIAGNOSIS — Z9889 Other specified postprocedural states: Secondary | ICD-10-CM | POA: Insufficient documentation

## 2021-11-18 DIAGNOSIS — I10 Essential (primary) hypertension: Secondary | ICD-10-CM | POA: Insufficient documentation

## 2021-11-18 DIAGNOSIS — D72829 Elevated white blood cell count, unspecified: Secondary | ICD-10-CM

## 2021-11-18 DIAGNOSIS — R079 Chest pain, unspecified: Secondary | ICD-10-CM | POA: Insufficient documentation

## 2021-11-18 DIAGNOSIS — E785 Hyperlipidemia, unspecified: Secondary | ICD-10-CM | POA: Insufficient documentation

## 2021-11-18 DIAGNOSIS — I2510 Atherosclerotic heart disease of native coronary artery without angina pectoris: Secondary | ICD-10-CM | POA: Diagnosis present

## 2021-11-18 DIAGNOSIS — I342 Nonrheumatic mitral (valve) stenosis: Secondary | ICD-10-CM

## 2021-11-18 DIAGNOSIS — I5032 Chronic diastolic (congestive) heart failure: Secondary | ICD-10-CM | POA: Diagnosis present

## 2021-11-18 LAB — COMPREHENSIVE METABOLIC PANEL
ALT: 14 IU/L (ref 0–32)
AST: 14 IU/L (ref 0–40)
Albumin/Globulin Ratio: 1.7 (ref 1.2–2.2)
Albumin: 4.5 g/dL (ref 3.8–4.9)
Alkaline Phosphatase: 73 IU/L (ref 44–121)
BUN/Creatinine Ratio: 17 (ref 9–23)
BUN: 16 mg/dL (ref 6–24)
Bilirubin Total: 0.4 mg/dL (ref 0.0–1.2)
CO2: 24 mmol/L (ref 20–29)
Calcium: 9.5 mg/dL (ref 8.7–10.2)
Chloride: 100 mmol/L (ref 96–106)
Creatinine, Ser: 0.94 mg/dL (ref 0.57–1.00)
Globulin, Total: 2.7 g/dL (ref 1.5–4.5)
Glucose: 133 mg/dL — ABNORMAL HIGH (ref 70–99)
Potassium: 4.7 mmol/L (ref 3.5–5.2)
Sodium: 138 mmol/L (ref 134–144)
Total Protein: 7.2 g/dL (ref 6.0–8.5)
eGFR: 73 mL/min/{1.73_m2} (ref >59)

## 2021-11-18 LAB — LIPID PANEL
Chol/HDL Ratio: 2.8 ratio (ref 0.0–4.4)
Cholesterol, Total: 130 mg/dL (ref 100–199)
HDL: 46 mg/dL (ref >39)
LDL Chol Calc (NIH): 50 mg/dL (ref 0–99)
Triglycerides: 212 mg/dL — ABNORMAL HIGH (ref 0–149)
VLDL Cholesterol Cal: 34 mg/dL (ref 5–40)

## 2021-11-18 LAB — ECHOCARDIOGRAM COMPLETE
Area-P 1/2: 1.58 cm2
Calc EF: 65.9 %
MV VTI: 1.15 cm2
S' Lateral: 3.1 cm
Single Plane A2C EF: 60.8 %
Single Plane A4C EF: 69.1 %

## 2021-11-18 LAB — CBC
Hematocrit: 41.9 % (ref 34.0–46.6)
Hemoglobin: 14.2 g/dL (ref 11.1–15.9)
MCH: 31.3 pg (ref 26.6–33.0)
MCHC: 33.9 g/dL (ref 31.5–35.7)
MCV: 92 fL (ref 79–97)
Platelets: 294 10*3/uL (ref 150–450)
RBC: 4.54 x10E6/uL (ref 3.77–5.28)
RDW: 12.5 % (ref 11.7–15.4)
WBC: 8.4 10*3/uL (ref 3.4–10.8)

## 2021-11-21 ENCOUNTER — Other Ambulatory Visit: Payer: Self-pay

## 2021-11-21 DIAGNOSIS — E7800 Pure hypercholesterolemia, unspecified: Secondary | ICD-10-CM

## 2021-11-21 DIAGNOSIS — E785 Hyperlipidemia, unspecified: Secondary | ICD-10-CM

## 2021-11-28 NOTE — Progress Notes (Deleted)
Cardiology Office Note    Date:  11/28/2021   ID:  Elizabeth Park, Elizabeth Park [birth year], MRN [id number]  PCP:  Elizabeth Ginger, PA-C  Cardiologist:  Lauree Chandler, MD  Electrophysiologist:  None   Chief Complaint: ***  History of Present Illness:   Elizabeth Park is a 51 y.o. female with history of CAD s/p 4V CABG 2011 and mitral valve repair for severe mitral regurgitation, DESx3 in 09/2016, DES to ramus intermediate in 02/2017, DESx2 to intermediate branch and distal RCA through the SVG-RCA in 01/2020, DM, HTN, HLD, former tobacco abuse, chronic diastolic CHF who is seen for post-ER follow-up for chest pain.   To recap, cardiac cath in 03/2009 by Dr. [name] showed MVCAD promting 4V CABG  (LIMA to LAD, SVG to subbranch of ramus intermediate, SVG to lateral subbranch of ramus intermediate, SVG to distal RCA) and mitral valve repair January 2011. She had chest pain in June 2018 that felt unlike prior angina but her nuclear stress test suggested ischemia. She underwent cardiac cath on 09/22/16 which showed progression of her CAD with resultant drug eluting stent to the vein graft to the PDA, a drug eluting stent in the native ramus intermediate branch and a drug eluting stent in the protected left main. In 02/2017 she developed recurrent chest pain with mixed features. Initially elective heart cath was arranged but outpatient troponin was positive so she was asked to present to the ED that night. Cardiac cath 02/07/17 showed severe 3 vessel disease, patent LIMA-LAD, occluded SVG-ramus x 2, patent SVG-dRCA, normal LVEDP, s/p DES to ramus intermediate. Last cath was in 01/2020 as outlined below with DESx2 to intermediate branch and distal RCA through the SVG-RCA in 01/2020. Dr. Angelena Form has previously recommended lifelong DAPT given her multiple stents. Amlodipine was previously stopped due to soft BP. Coreg was later further adjusted due to fatigue as well. She had normal ABI for leg pain in 02/2017 (ordered  by podiatrist).   She was recently seen for post ER follow-up for episode of chest pain with reassuring ER workup. This had atypical features and was unlike prior angina. See note 11/04/2021 for details. We undertook 2D echocardiogram which showed normal EF, normal RV, mild mitral stenosis, no significant change from prior study. She has mammoplasty slated for October. She had hand surgery earlier this year and did well with this.  See 11/2021 vascepa note below  Chest pain, known CAD, HLD goal LDL <70 H/o MV repair Essential HTN Chronic diastolic CHF Preop evaluation      Labwork independently reviewed: 11/2021 CBC wnl, K 4.7, Cr 0.94, LFTs OK, trig 212, LDL 50 -> vascepa suggested and pt requested dietary changes with f/u labs in 3-4 months 10/2021 troponins neg x2, Hgb 15.4, plt 365, K 5.0, Cr 0.93, calcium 10.5 08/2021 LFTs ok, LDL 79 2020 TSH wnl     Cardiology Studies:   Studies reviewed are outlined and summarized above. Reports included below if pertinent.   Cath 01/2020 Previously placed LM drug eluting stent is widely patent. Balloon angioplasty was performed. Previously placed Ost Ramus to Ramus drug eluting stent is widely patent. Balloon angioplasty was performed. Lat Ramus lesion is 100% stenosed. Prox RCA lesion is 100% stenosed. Dist RCA lesion is 100% stenosed. SVG and is normal in caliber. Previously placed Origin to Prox Graft drug eluting stent is widely patent. SVG. Mid Graft lesion is 100% stenosed. SVG. Origin lesion is 100% stenosed. LIMA and is normal in caliber. RPAV lesion is  99% stenosed. A drug-eluting stent was successfully placed using a STENT RESOLUTE ONYX 2.5X12. Post intervention, there is a 0% residual stenosis. 1st Diag lesion is 99% stenosed. Previously placed Dist LM to Prox LAD stent (unknown type) is widely patent. SVG graft was not injected. Origin to Prox Graft lesion is 100% stenosed. A drug-eluting stent was successfully placed  using a SYNERGY XD 2.50X12. Post intervention, there is a 0% residual stenosis.   1. Severe triple vessel CAD s/p 4/4 CABG with 2/4 patent grafts 2. 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 3. The RCA is a large dominant vessel with chronic proximal total occlusion. The vein graft to the distal RCA is patent with a patent stent in the proximal body of the graft. Severe stenosis native RCA in the posterolateral branch filling via the vein graft.  4. Successful PTCA/DES x 1 posterolateral artery through the vein graft.  5. The intermediate is a large branch with a long proximal stented segment. The distal end of the stented segment has severe restenosis.  6. Successful PTCA/DES x 1 mid intermediate branch.  7.  The origin of the left vessel is essentially a long segment that gives rise to the LAD proper and intermediate branch. The proximal segment of this vessel has a patent stented segment. The LAD is protected by the LIMA graft.    Recommendations: Continue current therapy. Continue DAPT    11/2018 2D echo   1. The left ventricle has normal systolic function with an ejection  fraction of 60-65%. The cavity size was normal. Left ventricular diastolic  Doppler parameters are indeterminate. There is abnormal septal motion  consistent with post-operative status.   2. The right ventricle has normal systolic function. The cavity was  normal. There is no increase in right ventricular wall thickness. Right  ventricular systolic pressure TR inadequate for RVSP assessment.   3. A Annuloplasty ring present valve is present in the mitral position.  Procedure Date: 2011.   4. Mild mitral valve stenosis.   5. S/p mitral valve annuloplasty. Mean gradient across valve ~4.3 mmHG @  HR 65 bpm. Gradients similar to prior study.   6. The tricuspid valve is grossly normal.   7. The aortic valve is tricuspid. No stenosis of the aortic valve.   8. The aorta  is normal unless otherwise noted.   9. The aortic root and ascending aorta are normal in size and structure.  10. When compared to the prior study: No significant change from prior  study (12/04/2014).    ABI 02/2017  Final Interpretation:  Right: Resting right ankle-brachial index is within normal range. No  evidence of significant right lower extremity arterial disease. The right  toe-brachial index is normal. PPG tracings appear dampened.  Left: Resting left ankle-brachial index is within normal range. No  evidence of significant left lower extremity arterial disease. The left  toe-brachial index is normal. PPG tracings appear dampened.     *See table(s) above for measurements and observations.      Electronically signed by Quay Burow on 02/21/2017 at 2:20:57 PM.    Past Medical History:  Diagnosis Date   Chronic diastolic CHF (congestive heart failure) (HCC)    Coronary artery disease    a. 4v CABG  (LIMA to LAD, SVG to subbranch of ramus intermediate, SVG to lateral subbranch of ramus intermediate, SVG to distal RCA) and mitral valve repair January 2011. b. Abnormal nuc 09/2016 s/p  drug eluting  stent to the vein graft to the PDA, a drug eluting stent in the native Ramus intermediate branch and a drug eluting stent in the protected left main. c. DES to ramus intermediate 02/2017.    Diabetes mellitus    H/O emotional problems    Headache, migraine    High cholesterol    History of blood transfusion    History of nuclear stress test    Myoview 07/2019: EF 60, normal perfusion, low risk.   Hypertension    MI, old    MRSA (methicillin resistant Staphylococcus aureus)    MVP (mitral valve prolapse)    Neuromuscular disorder (HCC)    Neuropathy    S/P mitral valve repair    Sebaceous cyst     Past Surgical History:  Procedure Laterality Date   ABDOMINAL HYSTERECTOMY     CARDIAC CATHETERIZATION  09/22/2016   CORONARY ARTERY BYPASS GRAFT  with valve repair   CORONARY  STENT INTERVENTION N/A 09/22/2016   Procedure: Coronary Stent Intervention;  Surgeon: Burnell Blanks, MD;  Location: [facility];  Service: Cardiovascular;  Laterality: N/A;   CORONARY STENT INTERVENTION N/A 02/07/2017   Procedure: CORONARY STENT INTERVENTION;  Surgeon: Martinique, Peter M, MD;  Location: [facility];  Service: Cardiovascular;  Laterality: N/A;   CORONARY STENT INTERVENTION N/A 01/30/2020   Procedure: CORONARY STENT INTERVENTION;  Surgeon: Burnell Blanks, MD;  Location: [facility];  Service: Cardiovascular;  Laterality: N/A;   CORONARY STENT PLACEMENT  09/22/2016   LEFT HEART CATH AND CORS/GRAFTS ANGIOGRAPHY N/A 09/22/2016   Procedure: Left Heart Cath and Cors/Grafts Angiography;  Surgeon: Burnell Blanks, MD;  Location: [facility];  Service: Cardiovascular;  Laterality: N/A;   LEFT HEART CATH AND CORS/GRAFTS ANGIOGRAPHY N/A 02/07/2017   Procedure: LEFT HEART CATH AND CORS/GRAFTS ANGIOGRAPHY;  Surgeon: Martinique, Peter M, MD;  Location: [facility];  Service: Cardiovascular;  Laterality: N/A;   LEFT HEART CATH AND CORS/GRAFTS ANGIOGRAPHY N/A 01/30/2020   Procedure: LEFT HEART CATH AND CORS/GRAFTS ANGIOGRAPHY;  Surgeon: Burnell Blanks, MD;  Location: [facility];  Service: Cardiovascular;  Laterality: N/A;   SKIN GRAFT      Current Medications: No outpatient medications have been marked as taking for the 11/30/21 encounter (Appointment) with Charlie Pitter, PA-C.   ***   Allergies:   Metformin   Social History   Socioeconomic History   Marital status: Divorced    Spouse name: Not on file   Number of children: 2   Years of education: Not on file   Highest education level: Not on file  Occupational History   Occupation: Disability  Tobacco Use   Smoking status: Former    Packs/day: 1.00    Years: 28.00    Total pack years: 28.00    Types: Cigarettes    Quit date: 03/31/2009    Years since quitting:  12.6   Smokeless tobacco: Never  Vaping Use   Vaping Use: Never used  Substance and Sexual Activity   Alcohol use: No   Drug use: No   Sexual activity: Yes    Birth control/protection: Surgical    Comment: partial hysterectomy  Other Topics Concern   Not on file  Social History Narrative   Not on file   Social Determinants of Health   Financial Resource Strain: Not on file  Food Insecurity: Not on file  Transportation Needs: No Transportation Needs (04/10/2017)   [place] - Transportation  Lack of Transportation (Medical): No    Lack of Transportation (Non-Medical): No  Physical Activity: Inactive (04/10/2017)   Exercise Vital Sign    Days of Exercise per Week: 0 days    Minutes of Exercise per Session: 0 min  Stress: No Stress Concern Present (04/10/2017)   [place]    Feeling of Stress : Only a little  Social Connections: Not on file     Family History:  The patient's ***family history includes Arthritis in her father; Asthma in her father; COPD in her father; Diabetes in her brother, father, and mother; Emphysema in her maternal grandmother; Heart disease in her father and mother; Hypertension in her brother, father, and mother; Migraines in her mother; Stroke in her mother. There is no history of Breast cancer.  ROS:   Please see the history of present illness. Otherwise, review of systems is positive for ***.  All other systems are reviewed and otherwise negative.    EKG(s)/Additional Labs   EKG:  EKG is ordered today, personally reviewed, demonstrating ***  Recent Labs: 11/18/2021: ALT 14; BUN 16; Creatinine, Ser 0.94; Hemoglobin 14.2; Platelets 294; Potassium 4.7; Sodium 138  Recent Lipid Panel    Component Value Date/Time   CHOL 130 11/18/2021 0745   TRIG 212 (H) 11/18/2021 0745   HDL 46 11/18/2021 0745   CHOLHDL 2.8 11/18/2021 0745   CHOLHDL 8.9 04/03/2009 0320   VLDL 54 (H) 04/03/2009 0320    LDLCALC 50 11/18/2021 0745    PHYSICAL EXAM:    VS:  There were no vitals taken for this visit.  BMI: There is no height or weight on file to calculate BMI.  GEN: Well nourished, well developed female in no acute distress HEENT: normocephalic, atraumatic Neck: no JVD, carotid bruits, or masses Cardiac: ***RRR; no murmurs, rubs, or gallops, no edema  Respiratory:  clear to auscultation bilaterally, normal work of breathing GI: soft, nontender, nondistended, + BS MS: no deformity or atrophy Skin: warm and dry, no rash Neuro:  Alert and Oriented x 3, Strength and sensation are intact, follows commands Psych: euthymic mood, full affect  Wt Readings from Last 3 Encounters:  11/04/21 204 lb 6.4 oz (92.7 kg)  10/18/21 200 lb (90.7 kg)  08/15/21 199 lb 6.4 oz (90.4 kg)     ASSESSMENT & PLAN:   ***     Disposition: F/u with ***   Medication Adjustments/Labs and Tests Ordered: Current medicines are reviewed at length with the patient today.  Concerns regarding medicines are outlined above. Medication changes, Labs and Tests ordered today are summarized above and listed in the Patient Instructions accessible in Encounters.   Signed, Charlie Pitter, PA-C  11/28/2021 1:13 PM    [place] Phone: [phone]; Fax: [phone]

## 2021-11-29 ENCOUNTER — Encounter: Payer: Self-pay | Admitting: Cardiovascular Disease

## 2021-11-29 NOTE — Telephone Encounter (Signed)
Error

## 2021-11-30 ENCOUNTER — Ambulatory Visit: Payer: Medicare HMO | Admitting: Physician Assistant

## 2021-11-30 DIAGNOSIS — E785 Hyperlipidemia, unspecified: Secondary | ICD-10-CM

## 2021-11-30 DIAGNOSIS — I2510 Atherosclerotic heart disease of native coronary artery without angina pectoris: Secondary | ICD-10-CM

## 2021-11-30 DIAGNOSIS — I10 Essential (primary) hypertension: Secondary | ICD-10-CM

## 2021-11-30 DIAGNOSIS — Z01810 Encounter for preprocedural cardiovascular examination: Secondary | ICD-10-CM

## 2021-11-30 DIAGNOSIS — I5032 Chronic diastolic (congestive) heart failure: Secondary | ICD-10-CM

## 2021-11-30 DIAGNOSIS — Z9889 Other specified postprocedural states: Secondary | ICD-10-CM

## 2021-11-30 DIAGNOSIS — R0789 Other chest pain: Secondary | ICD-10-CM

## 2022-01-03 NOTE — Progress Notes (Unsigned)
Cardiology Office Note    Date:  01/04/2022   ID:  [name], DOB 23-Jul-1970, MRN [id number]  PCP:  Secundino Ginger, PA-C  Cardiologist:  Lauree Chandler, MD  Electrophysiologist:  None   Chief Complaint: chest pain  History of Present Illness:   Elizabeth Park is a 51 y.o. female with history of CAD s/p 4V CABG 2011 with mitral valve repair for severe mitral regurgitation, DESx3 in 09/2016, DES to ramus intermediate in 02/2017, DESx2 to intermediate branch and distal RCA through the SVG-RCA in 01/2020, DM, HTN, HLD, former tobacco abuse, chronic diastolic CHF who is seen for follow-up and recurrent chest pain.     She underwent 4V CABG (LIMA to LAD, SVG to subbranch of ramus intermediate, SVG to lateral subbranch of ramus intermediate, SVG to distal RCA) and mitral valve repair January 2011. She had chest pain in June 2018 and nuclear stress test suggested ischemia. She underwent cardiac cath on 09/22/16 which showed progression of her CAD with resultant DES to VG-PDA, DES->native ramus intermediate branch and DES to protected left main. In 02/2017 she developed recurrent chest pain with mixed features. Initially elective heart cath was arranged but outpatient troponin was positive so she was asked to present to the ED. Repeat cath showed severe 3 vessel disease, patent LIMA-LAD, occluded SVG-ramus x 2, patent SVG-dRCA, normal LVEDP, s/p DES to ramus intermediate. Last cath in 01/2020 for recurrent chest pain resulted in DESx2 to intermediate branch and distal RCA through the SVG-RCA in 01/2020. Dr. Angelena Form has previously recommended lifelong DAPT given her multiple stents. Amlodipine was previously stopped due to soft BP. Coreg was previously adjusted due to fatigue. She had normal ABI for leg pain in 02/2017. She had hand surgery earlier this year and did well with this; Dr. Angelena Form had granted permission to hold antiplatelets.  She was recently seen 11/2021 for post ER follow-up for  episode of chest pain with reassuring ER workup. She reported atypical features and was unlike prior angina. (Historically the patient has been able to distinguish her angina versus non-anginal symptoms.) See note 11/04/2021 for details. 2D echocardiogram which showed normal EF, normal RV, mild mitral stenosis, no significant change from prior study. She originally had mammoplasty planned for this upcoming week and had held her ASA and Plavix starting 9/27 though the surgery was cancelled due to elevated A1C. She reports that on 9/29 she developed recurrent chest pain associated with SOB that *did* feel like prior angina, associated with some heart racing and SOB. She was at the [employer] at the time. She made it back to her car and calmed down and symptoms resolved. Total duration of episode 6-7 minutes. She did not have to take SL NTG. She restarted her ASA/Plavix that day. Since that time she has had brief episodes of recurrences including episode waking her from sleep, resolved with taking additional ASA. She can feel some left upper shoulder cramping this morning (different than her usual angina) but no residual chest pain and otherwise feels fine. She is not tachycardic, tachypenic or hypoxic.   Labwork independently reviewed: 11/2021 CBC wnl, K 4.7, Cr 0.94, LFTs OK, trig 212, LDL 50 -> vascepa suggested and pt requested dietary changes with f/u labs in 3-4 months  10/2021 troponins neg x2, Hgb 15.4, plt 365, K 5.0, Cr 0.93, calcium 10.5  08/2021 LFTs ok, LDL 79  2020 TSH wnl    Cardiology Studies:   Studies reviewed are outlined and summarized above. Reports  included below if pertinent.   2d echo 11/2021    1. Left ventricular ejection fraction, by estimation, is 60 to 65%. The  left ventricle has normal function. The left ventricle has no regional  wall motion abnormalities. Left ventricular diastolic function could not  be evaluated.   2. Right ventricular systolic function is normal. The  right ventricular  size is normal.   3. The mitral valve has been repaired/replaced. No evidence of mitral  valve regurgitation. Mild mitral stenosis. The mean mitral valve gradient  is 5.0 mmHg. There is a prosthetic annuloplasty ring present in the mitral  position. Procedure Date: 2011.   4. The aortic valve is grossly normal. Aortic valve regurgitation is not  visualized. No aortic stenosis is present.   5. The inferior vena cava is normal in size with greater than 50%  respiratory variability, suggesting right atrial pressure of 3 mmHg.   Comparison(s): No significant change from prior study.   [place] 01/2020 Previously placed LM drug eluting stent is widely patent. Balloon angioplasty was performed. Previously placed Ost Ramus to Ramus drug eluting stent is widely patent. Balloon angioplasty was performed. Lat Ramus lesion is 100% stenosed. Prox RCA lesion is 100% stenosed. Dist RCA lesion is 100% stenosed. SVG and is normal in caliber. Previously placed Origin to Prox Graft drug eluting stent is widely patent. SVG. Mid Graft lesion is 100% stenosed. SVG. Origin lesion is 100% stenosed. LIMA and is normal in caliber. RPAV lesion is 99% stenosed. A drug-eluting stent was successfully placed using a STENT RESOLUTE ONYX 2.5X12. Post intervention, there is a 0% residual stenosis. 1st Diag lesion is 99% stenosed. Previously placed Dist LM to Prox LAD stent (unknown type) is widely patent. SVG graft was not injected. Origin to Prox Graft lesion is 100% stenosed. A drug-eluting stent was successfully placed using a SYNERGY XD 2.50X12. Post intervention, there is a 0% residual stenosis.   1. Severe triple vessel CAD s/p 4/4 CABG with 2/4 patent grafts 2. 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 3. The RCA is a large dominant vessel with chronic proximal total occlusion. The vein graft to the distal RCA is patent with a patent  stent in the proximal body of the graft. Severe stenosis native RCA in the posterolateral branch filling via the vein graft.  4. Successful PTCA/DES x 1 posterolateral artery through the vein graft.  5. The intermediate is a large branch with a long proximal stented segment. The distal end of the stented segment has severe restenosis.  6. Successful PTCA/DES x 1 mid intermediate branch.  7.  The origin of the left vessel is essentially a long segment that gives rise to the LAD proper and intermediate branch. The proximal segment of this vessel has a patent stented segment. The LAD is protected by the LIMA graft.    Recommendations: Continue current therapy. Continue DAPT with ASA and Plavix for lifetime. Same day post PCI discharge today.       Past Medical History:  Diagnosis Date   Chronic diastolic CHF (congestive heart failure) (HCC)    Coronary artery disease    a. 4v CABG  (LIMA to LAD, SVG to subbranch of ramus intermediate, SVG to lateral subbranch of ramus intermediate, SVG to distal RCA) and mitral valve repair January 2011. b. Abnormal nuc 09/2016 s/p  drug eluting stent to the vein graft to the PDA, a drug eluting stent in the native Ramus intermediate branch and  a drug eluting stent in the protected left main. c. DES to ramus intermediate 02/2017.    Diabetes mellitus    H/O emotional problems    Headache, migraine    High cholesterol    History of blood transfusion    History of nuclear stress test    Myoview 07/2019: EF 60, normal perfusion, low risk.   Hypertension    MI, old    MRSA (methicillin resistant Staphylococcus aureus)    MVP (mitral valve prolapse)    Neuromuscular disorder (HCC)    Neuropathy    S/P mitral valve repair    Sebaceous cyst     Past Surgical History:  Procedure Laterality Date   ABDOMINAL HYSTERECTOMY     CARDIAC CATHETERIZATION  09/22/2016   CORONARY ARTERY BYPASS GRAFT  with valve repair   CORONARY STENT INTERVENTION N/A 09/22/2016    Procedure: Coronary Stent Intervention;  Surgeon: Burnell Blanks, MD;  Location: [facility];  Service: Cardiovascular;  Laterality: N/A;   CORONARY STENT INTERVENTION N/A 02/07/2017   Procedure: CORONARY STENT INTERVENTION;  Surgeon: Martinique, Peter M, MD;  Location: [facility];  Service: Cardiovascular;  Laterality: N/A;   CORONARY STENT INTERVENTION N/A 01/30/2020   Procedure: CORONARY STENT INTERVENTION;  Surgeon: Burnell Blanks, MD;  Location: [facility];  Service: Cardiovascular;  Laterality: N/A;   CORONARY STENT PLACEMENT  09/22/2016   LEFT HEART CATH AND CORS/GRAFTS ANGIOGRAPHY N/A 09/22/2016   Procedure: Left Heart Cath and Cors/Grafts Angiography;  Surgeon: Burnell Blanks, MD;  Location: [facility];  Service: Cardiovascular;  Laterality: N/A;   LEFT HEART CATH AND CORS/GRAFTS ANGIOGRAPHY N/A 02/07/2017   Procedure: LEFT HEART CATH AND CORS/GRAFTS ANGIOGRAPHY;  Surgeon: Martinique, Peter M, MD;  Location: [facility];  Service: Cardiovascular;  Laterality: N/A;   LEFT HEART CATH AND CORS/GRAFTS ANGIOGRAPHY N/A 01/30/2020   Procedure: LEFT HEART CATH AND CORS/GRAFTS ANGIOGRAPHY;  Surgeon: Burnell Blanks, MD;  Location: [facility];  Service: Cardiovascular;  Laterality: N/A;   SKIN GRAFT      Current Medications: Current Meds  Medication Sig   aspirin 81 MG tablet Take 81 mg by mouth daily.    carvedilol (COREG) 3.125 MG tablet TAKE 1 TABLET BY MOUTH TWICE A DAY   clopidogrel (PLAVIX) 75 MG tablet TAKE 1 TABLET BY MOUTH EVERY DAY WITH BREAKFAST   Continuous Blood Gluc Sensor (FREESTYLE LIBRE 2 SENSOR) MISC Apply 1 Device topically every 14 (fourteen) days. Wears continuously changes every 14 days   ezetimibe (ZETIA) 10 MG tablet Take 1 tablet (10 mg total) by mouth daily.   gabapentin (NEURONTIN) 300 MG capsule Take 300 mg by mouth 3 (three) times daily.    insulin aspart (NOVOLOG) 100 UNIT/ML injection Inject  8-12 Units into the skin See admin instructions. Per sliding scale Under 150 no units 150-199 take 8 units 200-249 take 10 units 250-299 take 12 units   isosorbide mononitrate (IMDUR) 30 MG 24 hr tablet TAKE 1/2 OF A TABLET (15 MG TOTAL) BY MOUTH DAILY   metFORMIN (GLUCOPHAGE-XR) 500 MG 24 hr tablet Take 500 mg by mouth in the morning and at bedtime.    MOUNJARO 5 MG/0.5ML Pen Inject 5 mg into the skin once a week.   nitroGLYCERIN (NITROSTAT) 0.4 MG SL tablet PLACE 1 TABLET UNDER THE TONGUE EVERY 5 MINUTES AS NEEDED FOR CHEST PAIN (UP TO 3 DOSES/15MIN)   ondansetron (ZOFRAN) 4 MG tablet Take 1 tablet (  4 mg total) by mouth every 6 (six) hours.   oxyCODONE-acetaminophen (PERCOCET) 10-325 MG per tablet Take 1 tablet by mouth 5 (five) times daily.    pantoprazole (PROTONIX) 40 MG tablet Take 1 tablet (40 mg total) by mouth daily.   rosuvastatin (CRESTOR) 40 MG tablet TAKE 1 TABLET (40 MG TOTAL) EVERY EVENING BY MOUTH.   TRESIBA FLEXTOUCH 100 UNIT/ML SOPN FlexTouch Pen Inject 60 Units as directed at bedtime.    zolpidem (AMBIEN) 5 MG tablet TAKE 1 TABLET BY MOUTH EVERY DAY AT BEDTIME AS NEEDED FOR SLEEP      Allergies:   Metformin   Social History   Socioeconomic History   Marital status: Divorced    Spouse name: Not on file   Number of children: 2   Years of education: Not on file   Highest education level: Not on file  Occupational History   Occupation: Disability  Tobacco Use   Smoking status: Not on file   Smokeless tobacco: Never  Vaping Use   Vaping Use: Never used  Substance and Sexual Activity   Alcohol use: No   Drug use: No   Sexual activity: Yes    Birth control/protection: Surgical    Comment: partial hysterectomy  Other Topics Concern   Not on file  Social History Narrative   Not on file   Social Determinants of Health   Financial Resource Strain: Not on file  Food Insecurity: Not on file  Transportation Needs: No Transportation Needs (04/10/2017)   PRAPARE -  Hydrologist (Medical): No    Lack of Transportation (Non-Medical): No  Physical Activity: Inactive (04/10/2017)   Exercise Vital Sign    Days of Exercise per Week: 0 days    Minutes of Exercise per Session: 0 min  Stress: No Stress Concern Present (04/10/2017)   [place]    Feeling of Stress : Only a little  Social Connections: Not on file     Family History:  The patient's family history includes Arthritis in her father; Asthma in her father; COPD in her father; Diabetes in her brother, father, and mother; Emphysema in her maternal grandmother; Heart disease in her father and mother; Hypertension in her brother, father, and mother; Migraines in her mother; Stroke in her mother. There is no history of Breast cancer.  ROS:   Please see the history of present illness.  All other systems are reviewed and otherwise negative.    EKG(s)/Additional Labs   EKG:  EKG is ordered today, personally reviewed, demonstrating SB 59bpm, low voltage QRS, nonspecific STTW changes. Presence of tiny Q waves inferiorly and V3-V6 appears most similar to 08/2021.  Recent Labs: 11/18/2021: ALT 14; BUN 16; Creatinine, Ser 0.94; Hemoglobin 14.2; Platelets 294; Potassium 4.7; Sodium 138  Recent Lipid Panel    Component Value Date/Time   CHOL 130 11/18/2021 0745   TRIG 212 (H) 11/18/2021 0745   HDL 46 11/18/2021 0745   CHOLHDL 2.8 11/18/2021 0745   CHOLHDL 8.9 04/03/2009 0320   VLDL 54 (H) 04/03/2009 0320   LDLCALC 50 11/18/2021 0745    PHYSICAL EXAM:    VS:  BP 130/82   Pulse (!) 59   Ht '5\' 4"'$  (1.626 m)   Wt 201 lb 9.6 oz (91.4 kg)   SpO2 98%   BMI 34.60 kg/m   BMI: Body mass index is 34.6 kg/m.  GEN: Well nourished, well developed female in no acute distress  HEENT: normocephalic, atraumatic Neck: no JVD, carotid bruits, or masses Cardiac: RRR; no murmurs, rubs, or gallops, no edema  Respiratory:   clear to auscultation bilaterally, normal work of breathing GI: soft, nontender, nondistended, + BS MS: no deformity or atrophy Skin: warm and dry, no rash Neuro:  Alert and Oriented x 3, Strength and sensation are intact, follows commands Psych: euthymic mood, full affect  Wt Readings from Last 3 Encounters:  01/04/22 201 lb 9.6 oz (91.4 kg)  11/04/21 204 lb 6.4 oz (92.7 kg)  10/18/21 200 lb (90.7 kg)     ASSESSMENT & PLAN:   1. Chest pain, known CAD - chest pain is concerning for angina pectoris. In the past when Elizabeth Park reports her anginal-like chest pain, she was found to have progressive stenoses. I reviewed her EKG and clinical symptoms with Dr. Angelena Form in clinic. He felt that since chest pain had resolved and EKG was nonacute that we could proceed with next available outpatient cardiac catheterization and increase Imdur to '30mg'$  daily. I did offer her admission to the hospital to expedite workup and cardiac cath and she declined. We will check an outpatient stat troponin and she understands that if this is positive, she'll need to proceed directly to the hospital. She does not want to wait at the office to have this done but knows to answer our call for the results and not to drive herself to the hospital. In the meantime we will continue ASA, Plavix (she knows not to stop), carvedilol, rosuvastatin, Zetia, and increased dose of Imdur. ER precautions reviewed. She knows to promptly seek care for any recurrent symptoms.   Shared Decision Making/Informed Consent The risks [stroke (1 in 1000), death (1 in 1000), kidney failure [usually temporary] (1 in 500), bleeding (1 in 200), allergic reaction [possibly serious] (1 in 200)], benefits (diagnostic support and management of coronary artery disease) and alternatives of a cardiac catheterization were discussed in detail with Elizabeth Park and she is willing to proceed. Will write pre-cath orders once we see her troponin level. Discussed DM med  recommendations with CMA to hold metformin, hold SSI, 1/2 dose long acting insulin night before procedure and hold [place] on Sunday in prep for Monday cath.  2. HLD goal LDL <70 - last lipid profile 11/18/21 with LDL 50 but triglycerides 212. Vascepa recommended at that time. Patient wished to try lifestyle modifications with f/u labs 02/2022. Would revisit this in follow-up if she is found to have progression.  3. H/o MV repair - stable with only mild mitral stenosis by repeat echo 11/18/21, no significant change reported from prior study. Continue clinical surveillance. SBE ppx has been reinforced at each visit.  4. Essential HTN - controlled on present regimen. Follow with increase in Imdur.  5. Chronic diastolic CHF - appears euvolemic on examination.   5. Preop evaluation - deferred for now given concern for progressive cardiac symptoms. Can revisit in follow-up.     Disposition: F/u with me after cath.   Medication Adjustments/Labs and Tests Ordered: Current medicines are reviewed at length with the patient today.  Concerns regarding medicines are outlined above. Medication changes, Labs and Tests ordered today are summarized above and listed in the Patient Instructions accessible in Encounters.   Signed, Charlie Pitter, PA-C  01/04/2022 9:57 AM    [facility] Phone: [phone]; Fax: [phone]

## 2022-01-03 NOTE — H&P (View-Only) (Signed)
Cardiology Office Note    Date:  01/04/2022   ID:  PAMULA LUTHER, DOB Dec 01, 1970, MRN [id number]  PCP:  Secundino Ginger, PA-C  Cardiologist:  Lauree Chandler, MD  Electrophysiologist:  None   Chief Complaint: chest pain  History of Present Illness:   Elizabeth Park is a 51 y.o. female with history of CAD s/p 4V CABG 2011 with mitral valve repair for severe mitral regurgitation, DESx3 in 09/2016, DES to ramus intermediate in 02/2017, DESx2 to intermediate branch and distal RCA through the SVG-RCA in 01/2020, DM, HTN, HLD, former tobacco abuse, chronic diastolic CHF who is seen for follow-up and recurrent chest pain.     She underwent 4V CABG (LIMA to LAD, SVG to subbranch of ramus intermediate, SVG to lateral subbranch of ramus intermediate, SVG to distal RCA) and mitral valve repair January 2011. She had chest pain in June 2018 and nuclear stress test suggested ischemia. She underwent cardiac cath on 09/22/16 which showed progression of her CAD with resultant DES to VG-PDA, DES->native ramus intermediate branch and DES to protected left main. In 02/2017 she developed recurrent chest pain with mixed features. Initially elective heart cath was arranged but outpatient troponin was positive so she was asked to present to the ED. Repeat cath showed severe 3 vessel disease, patent LIMA-LAD, occluded SVG-ramus x 2, patent SVG-dRCA, normal LVEDP, s/p DES to ramus intermediate. Last cath in 01/2020 for recurrent chest pain resulted in DESx2 to intermediate branch and distal RCA through the SVG-RCA in 01/2020. Dr. Angelena Form has previously recommended lifelong DAPT given her multiple stents. Amlodipine was previously stopped due to soft BP. Coreg was previously adjusted due to fatigue. She had normal ABI for leg pain in 02/2017. She had hand surgery earlier this year and did well with this; Dr. Angelena Form had granted permission to hold antiplatelets.  She was recently seen 11/2021 for post ER follow-up for  episode of chest pain with reassuring ER workup. She reported atypical features and was unlike prior angina. (Historically the patient has been able to distinguish her angina versus non-anginal symptoms.) See note 11/04/2021 for details. 2D echocardiogram which showed normal EF, normal RV, mild mitral stenosis, no significant change from prior study. She originally had mammoplasty planned for this upcoming week and had held her ASA and Plavix starting 9/27 though the surgery was cancelled due to elevated A1C. She reports that on 9/29 she developed recurrent chest pain associated with SOB that *did* feel like prior angina, associated with some heart racing and SOB. She was at the [employer] at the time. She made it back to her car and calmed down and symptoms resolved. Total duration of episode 6-7 minutes. She did not have to take SL NTG. She restarted her ASA/Plavix that day. Since that time she has had brief episodes of recurrences including episode waking her from sleep, resolved with taking additional ASA. She can feel some left upper shoulder cramping this morning (different than her usual angina) but no residual chest pain and otherwise feels fine. She is not tachycardic, tachypenic or hypoxic.   Labwork independently reviewed: 11/2021 CBC wnl, K 4.7, Cr 0.94, LFTs OK, trig 212, LDL 50 -> vascepa suggested and pt requested dietary changes with f/u labs in 3-4 months  10/2021 troponins neg x2, Hgb 15.4, plt 365, K 5.0, Cr 0.93, calcium 10.5  08/2021 LFTs ok, LDL 79  2020 TSH wnl    Cardiology Studies:   Studies reviewed are outlined and summarized above. Reports  included below if pertinent.   2d echo 11/2021    1. Left ventricular ejection fraction, by estimation, is 60 to 65%. The  left ventricle has normal function. The left ventricle has no regional  wall motion abnormalities. Left ventricular diastolic function could not  be evaluated.   2. Right ventricular systolic function is normal. The  right ventricular  size is normal.   3. The mitral valve has been repaired/replaced. No evidence of mitral  valve regurgitation. Mild mitral stenosis. The mean mitral valve gradient  is 5.0 mmHg. There is a prosthetic annuloplasty ring present in the mitral  position. Procedure Date: 2011.   4. The aortic valve is grossly normal. Aortic valve regurgitation is not  visualized. No aortic stenosis is present.   5. The inferior vena cava is normal in size with greater than 50%  respiratory variability, suggesting right atrial pressure of 3 mmHg.   Comparison(s): No significant change from prior study.   [place] 01/2020 Previously placed LM drug eluting stent is widely patent. Balloon angioplasty was performed. Previously placed Ost Ramus to Ramus drug eluting stent is widely patent. Balloon angioplasty was performed. Lat Ramus lesion is 100% stenosed. Prox RCA lesion is 100% stenosed. Dist RCA lesion is 100% stenosed. SVG and is normal in caliber. Previously placed Origin to Prox Graft drug eluting stent is widely patent. SVG. Mid Graft lesion is 100% stenosed. SVG. Origin lesion is 100% stenosed. LIMA and is normal in caliber. RPAV lesion is 99% stenosed. A drug-eluting stent was successfully placed using a STENT RESOLUTE ONYX 2.5X12. Post intervention, there is a 0% residual stenosis. 1st Diag lesion is 99% stenosed. Previously placed Dist LM to Prox LAD stent (unknown type) is widely patent. SVG graft was not injected. Origin to Prox Graft lesion is 100% stenosed. A drug-eluting stent was successfully placed using a SYNERGY XD 2.50X12. Post intervention, there is a 0% residual stenosis.   1. Severe triple vessel CAD s/p 4/4 CABG with 2/4 patent grafts 2. 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 3. The RCA is a large dominant vessel with chronic proximal total occlusion. The vein graft to the distal RCA is patent with a patent  stent in the proximal body of the graft. Severe stenosis native RCA in the posterolateral branch filling via the vein graft.  4. Successful PTCA/DES x 1 posterolateral artery through the vein graft.  5. The intermediate is a large branch with a long proximal stented segment. The distal end of the stented segment has severe restenosis.  6. Successful PTCA/DES x 1 mid intermediate branch.  7.  The origin of the left vessel is essentially a long segment that gives rise to the LAD proper and intermediate branch. The proximal segment of this vessel has a patent stented segment. The LAD is protected by the LIMA graft.    Recommendations: Continue current therapy. Continue DAPT with ASA and Plavix for lifetime. Same day post PCI discharge today.       Past Medical History:  Diagnosis Date   Chronic diastolic CHF (congestive heart failure) (HCC)    Coronary artery disease    a. 4v CABG  (LIMA to LAD, SVG to subbranch of ramus intermediate, SVG to lateral subbranch of ramus intermediate, SVG to distal RCA) and mitral valve repair January 2011. b. Abnormal nuc 09/2016 s/p  drug eluting stent to the vein graft to the PDA, a drug eluting stent in the native Ramus intermediate branch and  a drug eluting stent in the protected left main. c. DES to ramus intermediate 02/2017.    Diabetes mellitus    H/O emotional problems    Headache, migraine    High cholesterol    History of blood transfusion    History of nuclear stress test    Myoview 07/2019: EF 60, normal perfusion, low risk.   Hypertension    MI, old    MRSA (methicillin resistant Staphylococcus aureus)    MVP (mitral valve prolapse)    Neuromuscular disorder (HCC)    Neuropathy    S/P mitral valve repair    Sebaceous cyst     Past Surgical History:  Procedure Laterality Date   ABDOMINAL HYSTERECTOMY     CARDIAC CATHETERIZATION  09/22/2016   CORONARY ARTERY BYPASS GRAFT  with valve repair   CORONARY STENT INTERVENTION N/A 09/22/2016    Procedure: Coronary Stent Intervention;  Surgeon: Burnell Blanks, MD;  Location: [facility];  Service: Cardiovascular;  Laterality: N/A;   CORONARY STENT INTERVENTION N/A 02/07/2017   Procedure: CORONARY STENT INTERVENTION;  Surgeon: Martinique, Peter M, MD;  Location: [facility];  Service: Cardiovascular;  Laterality: N/A;   CORONARY STENT INTERVENTION N/A 01/30/2020   Procedure: CORONARY STENT INTERVENTION;  Surgeon: Burnell Blanks, MD;  Location: [facility];  Service: Cardiovascular;  Laterality: N/A;   CORONARY STENT PLACEMENT  09/22/2016   LEFT HEART CATH AND CORS/GRAFTS ANGIOGRAPHY N/A 09/22/2016   Procedure: Left Heart Cath and Cors/Grafts Angiography;  Surgeon: Burnell Blanks, MD;  Location: [facility];  Service: Cardiovascular;  Laterality: N/A;   LEFT HEART CATH AND CORS/GRAFTS ANGIOGRAPHY N/A 02/07/2017   Procedure: LEFT HEART CATH AND CORS/GRAFTS ANGIOGRAPHY;  Surgeon: Martinique, Peter M, MD;  Location: [facility];  Service: Cardiovascular;  Laterality: N/A;   LEFT HEART CATH AND CORS/GRAFTS ANGIOGRAPHY N/A 01/30/2020   Procedure: LEFT HEART CATH AND CORS/GRAFTS ANGIOGRAPHY;  Surgeon: Burnell Blanks, MD;  Location: [facility];  Service: Cardiovascular;  Laterality: N/A;   SKIN GRAFT      Current Medications: Current Meds  Medication Sig   aspirin 81 MG tablet Take 81 mg by mouth daily.    carvedilol (COREG) 3.125 MG tablet TAKE 1 TABLET BY MOUTH TWICE A DAY   clopidogrel (PLAVIX) 75 MG tablet TAKE 1 TABLET BY MOUTH EVERY DAY WITH BREAKFAST   Continuous Blood Gluc Sensor (FREESTYLE LIBRE 2 SENSOR) MISC Apply 1 Device topically every 14 (fourteen) days. Wears continuously changes every 14 days   ezetimibe (ZETIA) 10 MG tablet Take 1 tablet (10 mg total) by mouth daily.   gabapentin (NEURONTIN) 300 MG capsule Take 300 mg by mouth 3 (three) times daily.    insulin aspart (NOVOLOG) 100 UNIT/ML injection Inject  8-12 Units into the skin See admin instructions. Per sliding scale Under 150 no units 150-199 take 8 units 200-249 take 10 units 250-299 take 12 units   isosorbide mononitrate (IMDUR) 30 MG 24 hr tablet TAKE 1/2 OF A TABLET (15 MG TOTAL) BY MOUTH DAILY   metFORMIN (GLUCOPHAGE-XR) 500 MG 24 hr tablet Take 500 mg by mouth in the morning and at bedtime.    MOUNJARO 5 MG/0.5ML Pen Inject 5 mg into the skin once a week.   nitroGLYCERIN (NITROSTAT) 0.4 MG SL tablet PLACE 1 TABLET UNDER THE TONGUE EVERY 5 MINUTES AS NEEDED FOR CHEST PAIN (UP TO 3 DOSES/15MIN)   ondansetron (ZOFRAN) 4 MG tablet Take 1 tablet (  4 mg total) by mouth every 6 (six) hours.   oxyCODONE-acetaminophen (PERCOCET) 10-325 MG per tablet Take 1 tablet by mouth 5 (five) times daily.    pantoprazole (PROTONIX) 40 MG tablet Take 1 tablet (40 mg total) by mouth daily.   rosuvastatin (CRESTOR) 40 MG tablet TAKE 1 TABLET (40 MG TOTAL) EVERY EVENING BY MOUTH.   TRESIBA FLEXTOUCH 100 UNIT/ML SOPN FlexTouch Pen Inject 60 Units as directed at bedtime.    zolpidem (AMBIEN) 5 MG tablet TAKE 1 TABLET BY MOUTH EVERY DAY AT BEDTIME AS NEEDED FOR SLEEP      Allergies:   Metformin   Social History   Socioeconomic History   Marital status: Divorced    Spouse name: Not on file   Number of children: 2   Years of education: Not on file   Highest education level: Not on file  Occupational History   Occupation: Disability  Tobacco Use   Smoking status: Not on file   Smokeless tobacco: Never  Vaping Use   Vaping Use: Never used  Substance and Sexual Activity   Alcohol use: No   Drug use: No   Sexual activity: Yes    Birth control/protection: Surgical    Comment: partial hysterectomy  Other Topics Concern   Not on file  Social History Narrative   Not on file   Social Determinants of Health   Financial Resource Strain: Not on file  Food Insecurity: Not on file  Transportation Needs: No Transportation Needs (04/10/2017)   PRAPARE -  Hydrologist (Medical): No    Lack of Transportation (Non-Medical): No  Physical Activity: Inactive (04/10/2017)   Exercise Vital Sign    Days of Exercise per Week: 0 days    Minutes of Exercise per Session: 0 min  Stress: No Stress Concern Present (04/10/2017)   Cut Off    Feeling of Stress : Only a little  Social Connections: Not on file     Family History:  The patient's family history includes Arthritis in her father; Asthma in her father; COPD in her father; Diabetes in her brother, father, and mother; Emphysema in her maternal grandmother; Heart disease in her father and mother; Hypertension in her brother, father, and mother; Migraines in her mother; Stroke in her mother. There is no history of Breast cancer.  ROS:   Please see the history of present illness.  All other systems are reviewed and otherwise negative.    EKG(s)/Additional Labs   EKG:  EKG is ordered today, personally reviewed, demonstrating SB 59bpm, low voltage QRS, nonspecific STTW changes. Presence of tiny Q waves inferiorly and V3-V6 appears most similar to 08/2021.  Recent Labs: 11/18/2021: ALT 14; BUN 16; Creatinine, Ser 0.94; Hemoglobin 14.2; Platelets 294; Potassium 4.7; Sodium 138  Recent Lipid Panel    Component Value Date/Time   CHOL 130 11/18/2021 0745   TRIG 212 (H) 11/18/2021 0745   HDL 46 11/18/2021 0745   CHOLHDL 2.8 11/18/2021 0745   CHOLHDL 8.9 04/03/2009 0320   VLDL 54 (H) 04/03/2009 0320   LDLCALC 50 11/18/2021 0745    PHYSICAL EXAM:    VS:  BP 130/82   Pulse (!) 59   Ht '5\' 4"'$  (1.626 m)   Wt 201 lb 9.6 oz (91.4 kg)   SpO2 98%   BMI 34.60 kg/m   BMI: Body mass index is 34.6 kg/m.  GEN: Well nourished, well developed female in no acute distress  HEENT: normocephalic, atraumatic Neck: no JVD, carotid bruits, or masses Cardiac: RRR; no murmurs, rubs, or gallops, no edema  Respiratory:   clear to auscultation bilaterally, normal work of breathing GI: soft, nontender, nondistended, + BS MS: no deformity or atrophy Skin: warm and dry, no rash Neuro:  Alert and Oriented x 3, Strength and sensation are intact, follows commands Psych: euthymic mood, full affect  Wt Readings from Last 3 Encounters:  01/04/22 201 lb 9.6 oz (91.4 kg)  11/04/21 204 lb 6.4 oz (92.7 kg)  10/18/21 200 lb (90.7 kg)     ASSESSMENT & PLAN:   1. Chest pain, known CAD - chest pain is concerning for angina pectoris. In the past when Elizabeth Park reports her anginal-like chest pain, she was found to have progressive stenoses. I reviewed her EKG and clinical symptoms with Dr. Angelena Form in clinic. He felt that since chest pain had resolved and EKG was nonacute that we could proceed with next available outpatient cardiac catheterization and increase Imdur to '30mg'$  daily. I did offer her admission to the hospital to expedite workup and cardiac cath and she declined. We will check an outpatient stat troponin and she understands that if this is positive, she'll need to proceed directly to the hospital. She does not want to wait at the office to have this done but knows to answer our call for the results and not to drive herself to the hospital. In the meantime we will continue ASA, Plavix (she knows not to stop), carvedilol, rosuvastatin, Zetia, and increased dose of Imdur. ER precautions reviewed. She knows to promptly seek care for any recurrent symptoms.   Shared Decision Making/Informed Consent The risks [stroke (1 in 1000), death (1 in 1000), kidney failure [usually temporary] (1 in 500), bleeding (1 in 200), allergic reaction [possibly serious] (1 in 200)], benefits (diagnostic support and management of coronary artery disease) and alternatives of a cardiac catheterization were discussed in detail with Elizabeth Park and she is willing to proceed. Will write pre-cath orders once we see her troponin level. Discussed DM med  recommendations with CMA to hold metformin, hold SSI, 1/2 dose long acting insulin night before procedure and hold [place] on Sunday in prep for Monday cath.  2. HLD goal LDL <70 - last lipid profile 11/18/21 with LDL 50 but triglycerides 212. Vascepa recommended at that time. Patient wished to try lifestyle modifications with f/u labs 02/2022. Would revisit this in follow-up if she is found to have progression.  3. H/o MV repair - stable with only mild mitral stenosis by repeat echo 11/18/21, no significant change reported from prior study. Continue clinical surveillance. SBE ppx has been reinforced at each visit.  4. Essential HTN - controlled on present regimen. Follow with increase in Imdur.  5. Chronic diastolic CHF - appears euvolemic on examination.   5. Preop evaluation - deferred for now given concern for progressive cardiac symptoms. Can revisit in follow-up.     Disposition: F/u with me after cath.   Medication Adjustments/Labs and Tests Ordered: Current medicines are reviewed at length with the patient today.  Concerns regarding medicines are outlined above. Medication changes, Labs and Tests ordered today are summarized above and listed in the Patient Instructions accessible in Encounters.   Signed, Charlie Pitter, PA-C  01/04/2022 9:57 AM    [place] Phone: [phone]; Fax: [phone]

## 2022-01-04 ENCOUNTER — Ambulatory Visit: Payer: Medicare HMO | Attending: Physician Assistant | Admitting: Physician Assistant

## 2022-01-04 ENCOUNTER — Other Ambulatory Visit: Payer: Self-pay | Admitting: Physician Assistant

## 2022-01-04 ENCOUNTER — Encounter: Payer: Self-pay | Admitting: Physician Assistant

## 2022-01-04 VITALS — BP 130/82 | HR 59 | Ht 64.0 in | Wt 201.6 lb

## 2022-01-04 DIAGNOSIS — I10 Essential (primary) hypertension: Secondary | ICD-10-CM

## 2022-01-04 DIAGNOSIS — I5032 Chronic diastolic (congestive) heart failure: Secondary | ICD-10-CM

## 2022-01-04 DIAGNOSIS — E785 Hyperlipidemia, unspecified: Secondary | ICD-10-CM

## 2022-01-04 DIAGNOSIS — Z01818 Encounter for other preprocedural examination: Secondary | ICD-10-CM

## 2022-01-04 DIAGNOSIS — I209 Angina pectoris, unspecified: Secondary | ICD-10-CM

## 2022-01-04 DIAGNOSIS — Z9889 Other specified postprocedural states: Secondary | ICD-10-CM | POA: Diagnosis not present

## 2022-01-04 DIAGNOSIS — I2510 Atherosclerotic heart disease of native coronary artery without angina pectoris: Secondary | ICD-10-CM | POA: Diagnosis not present

## 2022-01-04 LAB — BASIC METABOLIC PANEL
BUN/Creatinine Ratio: 17 (ref 9–23)
BUN: 16 mg/dL (ref 6–24)
CO2: 28 mmol/L (ref 20–29)
Calcium: 9.8 mg/dL (ref 8.7–10.2)
Chloride: 103 mmol/L (ref 96–106)
Creatinine, Ser: 0.95 mg/dL (ref 0.57–1.00)
Glucose: 131 mg/dL — ABNORMAL HIGH (ref 70–99)
Potassium: 4.6 mmol/L (ref 3.5–5.2)
Sodium: 138 mmol/L (ref 134–144)
eGFR: 72 mL/min/{1.73_m2} (ref >59)

## 2022-01-04 LAB — CBC
Hematocrit: 40.9 % (ref 34.0–46.6)
Hemoglobin: 13.8 g/dL (ref 11.1–15.9)
MCH: 31 pg (ref 26.6–33.0)
MCHC: 33.7 g/dL (ref 31.5–35.7)
MCV: 92 fL (ref 79–97)
Platelets: 290 10*3/uL (ref 150–450)
RBC: 4.45 x10E6/uL (ref 3.77–5.28)
RDW: 13.2 % (ref 11.7–15.4)
WBC: 7.5 10*3/uL (ref 3.4–10.8)

## 2022-01-04 LAB — TROPONIN T: Troponin T (Highly Sensitive): 9 ng/L (ref 0–14)

## 2022-01-04 MED ORDER — SODIUM CHLORIDE 0.9% FLUSH: 3.0000 mL | Freq: Two times a day (BID) | INTRAVENOUS | Status: DC

## 2022-01-04 MED ORDER — ISOSORBIDE MONONITRATE ER 30 MG PO TB24: 30.0000 mg | ORAL_TABLET | Freq: Every day | ORAL | 3 refills | Status: DC

## 2022-01-04 NOTE — Patient Instructions (Addendum)
Medication Instructions:  INCREASE Imdur to '30mg'$  Take 1 tablet daily  *If you need a refill on your cardiac medications before your next appointment, please call your pharmacy*   Lab Work: TODAY-STAT-TROPONIN, BMET, CBC If you have labs (blood work) drawn today and your tests are completely normal, you will receive your results only by: [place] (if you have MyChart) OR A paper copy in the mail If you have any lab test that is abnormal or we need to change your treatment, we will call you to review the results.   Testing/Procedures: Your physician has requested that you have a cardiac catheterization. Cardiac catheterization is used to diagnose and/or treat various heart conditions. Doctors may recommend this procedure for a number of different reasons. The most common reason is to evaluate chest pain. Chest pain can be a symptom of coronary artery disease (CAD), and cardiac catheterization can show whether plaque is narrowing or blocking your heart's arteries. This procedure is also used to evaluate the valves, as well as measure the blood flow and oxygen levels in different parts of your heart. For further information please visit [email or web address]. Please follow instruction sheet, as given. SEE INSTRUCTIONS BELOW   Follow-Up: At [facility], you and your health needs are our priority.  As part of our continuing mission to provide you with exceptional heart care, we have created designated Provider Care Teams.  These Care Teams include your primary Cardiologist (physician) and Advanced Practice Providers (APPs -  Physician Assistants and Nurse Practitioners) who all work together to provide you with the care you need, when you need it.  We recommend signing up for the patient portal called "MyChart".  Sign up information is provided on this After Visit Summary.  MyChart is used to connect with patients for Virtual Visits (Telemedicine).  Patients are able to view lab/test  results, encounter notes, upcoming appointments, etc.  Non-urgent messages can be sent to your provider as well.   To learn more about what you can do with MyChart, go to [email or web address].    Your next appointment:   2-3 week(s)  The format for your next appointment:   In Person  Provider:   Melina Copa, PA-C        Other Instructions         Cardiac Catheterization   You are scheduled for a Cardiac Catheterization on Monday, October 8 with Dr. Harrell Gave End.  1. Please arrive at the Main Entrance A at [facility]: [place], [place] [address] on October 9 at 10:00 am (This time is two hours before your procedure to ensure your preparation). Free valet parking service is available. You will check in at ADMITTING. The support person will be asked to wait in the waiting room.  It is OK to have someone drop you off and come back when you are ready to be discharged.        Special note: Every effort is made to have your procedure done on time. Please understand that emergencies sometimes delay scheduled procedures.   . 2. Diet: Do not eat solid foods after midnight.  You may have clear liquids until 5 AM the day of the procedure.  3. Labs: You will need to have blood drawn on Wednesday, October 4 at [facility] at [facility]. [address]  Open: 7:30am - 5pm    Phone: [phone]. You do not need to be fasting.  4. Medication instructions in  preparation for your procedure:   Contrast Allergy: No  HOLD SLIDING SCALE INSULIN THE MORNING OF PROCEDURE  HOLD YOU SUNDAY DOSE OF MOUNJARO AND TAKE AFTER CATH  Take only 30 units of insulin the night before your procedure. Do not take any insulin on the day of the procedure.  Do not take Diabetes Med Glucophage (Metformin) on the day of the procedure and HOLD 48 HOURS AFTER THE PROCEDURE.  On the morning of your procedure, take Aspirin 81 mg and any morning medicines  NOT listed above.  You may use sips of water.  5. Plan to go home the same day, you will only stay overnight if medically necessary. 6. You MUST have a responsible adult to drive you home. 7. An adult MUST be with you the first 24 hours after you arrive home. 8. Bring a current list of your medications, and the last time and date medication taken. 9. Bring ID and current insurance cards. 10.Please wear clothes that are easy to get on and off and wear slip-on shoes.  Thank you for allowing [place] to care for you!   -- [facility]   Important Information About [place]

## 2022-01-05 ENCOUNTER — Telehealth: Payer: Self-pay | Admitting: *Deleted

## 2022-01-05 NOTE — Telephone Encounter (Signed)
Cardiac Catheterization scheduled at [facility] for: Monday January 09, 2022 10 AM Arrival time and place: [facility] Main Entrance A at: 8 AM  Nothing to eat after midnight prior to procedure, clear liquids until 5 AM day of procedure.  Medication instructions: -Hold:  Insulin-AM of procedure  Trulicity-1/2 usual dose HS prior to procedure  Metformin-day of procedure and 48 hours post procedure  Mounjaro-weekly on Sundays-will hold until post procedure -Except hold medications usual morning medications can be taken with sips of water including aspirin 81 mg and Plavix 75 mg.  Confirmed patient has responsible adult to drive home post procedure and be with patient first 24 hours after arriving home.  Patient reports no new symptoms concerning for COVID-19 in the past 10 days.  Reviewed procedure instructions with patient.

## 2022-01-09 ENCOUNTER — Other Ambulatory Visit: Payer: Self-pay

## 2022-01-09 ENCOUNTER — Encounter (HOSPITAL_COMMUNITY)
Admission: RE | Disposition: A | Discharge status: Home / Self Care | Payer: Self-pay | Source: Home / Self Care | Attending: Internal Medicine

## 2022-01-09 ENCOUNTER — Observation Stay (HOSPITAL_COMMUNITY)
Admission: RE | Admit: 2022-01-09 | Discharge: 2022-01-10 | Disposition: A | Discharge status: Home / Self Care | Payer: Medicare HMO | Attending: Cardiology | Admitting: Cardiology

## 2022-01-09 DIAGNOSIS — L7632 Postprocedural hematoma of skin and subcutaneous tissue following other procedure: Secondary | ICD-10-CM | POA: Insufficient documentation

## 2022-01-09 DIAGNOSIS — E119 Type 2 diabetes mellitus without complications: Secondary | ICD-10-CM | POA: Insufficient documentation

## 2022-01-09 DIAGNOSIS — I97410 Intraoperative hemorrhage and hematoma of a circulatory system organ or structure complicating a cardiac catheterization: Secondary | ICD-10-CM

## 2022-01-09 DIAGNOSIS — I25700 Atherosclerosis of coronary artery bypass graft(s), unspecified, with unstable angina pectoris: Secondary | ICD-10-CM | POA: Diagnosis not present

## 2022-01-09 DIAGNOSIS — I110 Hypertensive heart disease with heart failure: Secondary | ICD-10-CM | POA: Insufficient documentation

## 2022-01-09 DIAGNOSIS — I200 Unstable angina: Secondary | ICD-10-CM | POA: Diagnosis present

## 2022-01-09 DIAGNOSIS — Z9889 Other specified postprocedural states: Secondary | ICD-10-CM

## 2022-01-09 DIAGNOSIS — I25710 Atherosclerosis of autologous vein coronary artery bypass graft(s) with unstable angina pectoris: Secondary | ICD-10-CM

## 2022-01-09 DIAGNOSIS — E118 Type 2 diabetes mellitus with unspecified complications: Secondary | ICD-10-CM | POA: Diagnosis present

## 2022-01-09 DIAGNOSIS — I340 Nonrheumatic mitral (valve) insufficiency: Secondary | ICD-10-CM | POA: Insufficient documentation

## 2022-01-09 DIAGNOSIS — Z955 Presence of coronary angioplasty implant and graft: Secondary | ICD-10-CM | POA: Diagnosis not present

## 2022-01-09 DIAGNOSIS — D62 Acute posthemorrhagic anemia: Secondary | ICD-10-CM

## 2022-01-09 DIAGNOSIS — I5032 Chronic diastolic (congestive) heart failure: Secondary | ICD-10-CM | POA: Diagnosis not present

## 2022-01-09 DIAGNOSIS — E785 Hyperlipidemia, unspecified: Secondary | ICD-10-CM | POA: Diagnosis present

## 2022-01-09 DIAGNOSIS — Y838 Other surgical procedures as the cause of abnormal reaction of the patient, or of later complication, without mention of misadventure at the time of the procedure: Secondary | ICD-10-CM | POA: Insufficient documentation

## 2022-01-09 DIAGNOSIS — I10 Essential (primary) hypertension: Secondary | ICD-10-CM | POA: Diagnosis present

## 2022-01-09 DIAGNOSIS — I25810 Atherosclerosis of coronary artery bypass graft(s) without angina pectoris: Secondary | ICD-10-CM | POA: Diagnosis present

## 2022-01-09 DIAGNOSIS — I2582 Chronic total occlusion of coronary artery: Secondary | ICD-10-CM | POA: Insufficient documentation

## 2022-01-09 HISTORY — PX: LEFT HEART CATH AND CORS/GRAFTS ANGIOGRAPHY: CATH118250

## 2022-01-09 HISTORY — PX: CORONARY STENT INTERVENTION: CATH118234

## 2022-01-09 LAB — POCT ACTIVATED CLOTTING TIME
Activated Clotting Time: 263 seconds
Activated Clotting Time: 275 seconds
Activated Clotting Time: 269 seconds
Activated Clotting Time: 305 seconds

## 2022-01-09 LAB — CBC
HCT: 32.4 % — ABNORMAL LOW (ref 36.0–46.0)
HCT: 31.7 % — ABNORMAL LOW (ref 36.0–46.0)
Hemoglobin: 11.3 g/dL — ABNORMAL LOW (ref 12.0–15.0)
Hemoglobin: 11.2 g/dL — ABNORMAL LOW (ref 12.0–15.0)
MCH: 31.6 pg (ref 26.0–34.0)
MCH: 32 pg (ref 26.0–34.0)
MCHC: 34.9 g/dL (ref 30.0–36.0)
MCHC: 35.3 g/dL (ref 30.0–36.0)
MCV: 90.5 fL (ref 80.0–100.0)
MCV: 90.6 fL (ref 80.0–100.0)
Platelets: 248 10*3/uL (ref 150–400)
Platelets: 250 10*3/uL (ref 150–400)
RBC: 3.58 MIL/uL — ABNORMAL LOW (ref 3.87–5.11)
RBC: 3.5 MIL/uL — ABNORMAL LOW (ref 3.87–5.11)
RDW: 12.4 % (ref 11.5–15.5)
RDW: 12.5 % (ref 11.5–15.5)
WBC: 12.3 10*3/uL — ABNORMAL HIGH (ref 4.0–10.5)
WBC: 9.4 10*3/uL (ref 4.0–10.5)
nRBC: 0 % (ref 0.0–0.2)
nRBC: 0 % (ref 0.0–0.2)

## 2022-01-09 LAB — GLUCOSE, CAPILLARY
Glucose-Capillary: 231 mg/dL — ABNORMAL HIGH (ref 70–99)
Glucose-Capillary: 163 mg/dL — ABNORMAL HIGH (ref 70–99)
Glucose-Capillary: 161 mg/dL — ABNORMAL HIGH (ref 70–99)

## 2022-01-09 SURGERY — LEFT HEART CATH AND CORS/GRAFTS ANGIOGRAPHY
Anesthesia: LOCAL

## 2022-01-09 MED ORDER — VERAPAMIL HCL 2.5 MG/ML IV SOLN
INTRAVENOUS | Status: DC | PRN
  Administered 2022-01-09 (×2): 500 ug via INTRACORONARY

## 2022-01-09 MED ORDER — ROSUVASTATIN CALCIUM 20 MG PO TABS
40.0000 mg | ORAL_TABLET | Freq: Every day | ORAL | Status: DC
  Administered 2022-01-09: 40 mg via ORAL
  Filled 2022-01-09 (×2): qty 2

## 2022-01-09 MED ORDER — LIDOCAINE HCL (PF) 1 % IJ SOLN
INTRAMUSCULAR | Status: DC | PRN
  Administered 2022-01-09: 15 mL via INTRADERMAL
  Administered 2022-01-09: 2 mL via INTRADERMAL

## 2022-01-09 MED ORDER — FENTANYL CITRATE (PF) 100 MCG/2ML IJ SOLN
INTRAMUSCULAR | Status: DC | PRN
  Administered 2022-01-09: 25 ug via INTRAVENOUS
  Administered 2022-01-09: 25 ug
  Administered 2022-01-09 (×5): 25 ug via INTRAVENOUS

## 2022-01-09 MED ORDER — NITROGLYCERIN 0.4 MG SL SUBL: 0.4000 mg | SUBLINGUAL_TABLET | SUBLINGUAL | Status: DC | PRN

## 2022-01-09 MED ORDER — ONDANSETRON HCL 4 MG/2ML IJ SOLN
INTRAMUSCULAR | Status: AC
  Filled 2022-01-09: qty 2

## 2022-01-09 MED ORDER — HEPARIN (PORCINE) IN NACL 1000-0.9 UT/500ML-% IV SOLN
INTRAVENOUS | Status: AC
  Filled 2022-01-09: qty 500

## 2022-01-09 MED ORDER — VERAPAMIL HCL 2.5 MG/ML IV SOLN
INTRAVENOUS | Status: DC | PRN
  Administered 2022-01-09 (×2): 10 mL via INTRA_ARTERIAL

## 2022-01-09 MED ORDER — ACETAMINOPHEN 325 MG PO TABS: 650.0000 mg | ORAL_TABLET | ORAL | Status: DC | PRN

## 2022-01-09 MED ORDER — INSULIN DEGLUDEC 100 UNIT/ML ~~LOC~~ SOPN: 60.0000 [IU] | PEN_INJECTOR | Freq: Every day | SUBCUTANEOUS | Status: DC

## 2022-01-09 MED ORDER — HEPARIN SODIUM (PORCINE) 1000 UNIT/ML IJ SOLN
INTRAMUSCULAR | Status: DC | PRN
  Administered 2022-01-09: 4500 [IU] via INTRAVENOUS
  Administered 2022-01-09: 2000 [IU] via INTRAVENOUS
  Administered 2022-01-09: 4500 [IU] via INTRAVENOUS
  Administered 2022-01-09: 2000 [IU] via INTRAVENOUS

## 2022-01-09 MED ORDER — FENTANYL CITRATE (PF) 100 MCG/2ML IJ SOLN
INTRAMUSCULAR | Status: AC
  Filled 2022-01-09: qty 2

## 2022-01-09 MED ORDER — SODIUM CHLORIDE 0.9% FLUSH
3.0000 mL | Freq: Two times a day (BID) | INTRAVENOUS | Status: DC
  Administered 2022-01-09 – 2022-01-10 (×2): 3 mL via INTRAVENOUS

## 2022-01-09 MED ORDER — SODIUM CHLORIDE 0.9 % IV SOLN: 250.0000 mL | INTRAVENOUS | Status: DC | PRN

## 2022-01-09 MED ORDER — SERTRALINE HCL 50 MG PO TABS
25.0000 mg | ORAL_TABLET | Freq: Every day | ORAL | Status: DC
  Administered 2022-01-09 – 2022-01-10 (×2): 25 mg via ORAL
  Filled 2022-01-09 (×2): qty 1

## 2022-01-09 MED ORDER — SODIUM CHLORIDE 0.9 % WEIGHT BASED INFUSION: 1.0000 mL/kg/h | INTRAVENOUS | Status: DC

## 2022-01-09 MED ORDER — MIDAZOLAM HCL 2 MG/2ML IJ SOLN
INTRAMUSCULAR | Status: AC
  Filled 2022-01-09: qty 2

## 2022-01-09 MED ORDER — CLOPIDOGREL BISULFATE 300 MG PO TABS
ORAL_TABLET | ORAL | Status: AC
  Filled 2022-01-09: qty 1

## 2022-01-09 MED ORDER — MIDAZOLAM HCL 2 MG/2ML IJ SOLN
INTRAMUSCULAR | Status: DC | PRN
  Administered 2022-01-09 (×4): 1 mg via INTRAVENOUS

## 2022-01-09 MED ORDER — LIDOCAINE HCL (PF) 1 % IJ SOLN
INTRAMUSCULAR | Status: AC
  Filled 2022-01-09: qty 30

## 2022-01-09 MED ORDER — CLOPIDOGREL BISULFATE 300 MG PO TABS
ORAL_TABLET | ORAL | Status: DC | PRN
  Administered 2022-01-09: 300 mg via ORAL

## 2022-01-09 MED ORDER — LABETALOL HCL 5 MG/ML IV SOLN: 10.0000 mg | INTRAVENOUS | Status: AC | PRN

## 2022-01-09 MED ORDER — OXYCODONE-ACETAMINOPHEN 5-325 MG PO TABS
ORAL_TABLET | ORAL | Status: AC
  Filled 2022-01-09: qty 1

## 2022-01-09 MED ORDER — INSULIN GLARGINE-YFGN 100 UNIT/ML ~~LOC~~ SOLN
60.0000 [IU] | Freq: Every day | SUBCUTANEOUS | Status: DC
  Administered 2022-01-09: 60 [IU] via SUBCUTANEOUS
  Filled 2022-01-09 (×2): qty 0.6

## 2022-01-09 MED ORDER — GABAPENTIN 300 MG PO CAPS
300.0000 mg | ORAL_CAPSULE | Freq: Three times a day (TID) | ORAL | Status: DC
  Administered 2022-01-09 – 2022-01-10 (×2): 300 mg via ORAL
  Filled 2022-01-09 (×2): qty 1

## 2022-01-09 MED ORDER — ASPIRIN 81 MG PO CHEW: 81.0000 mg | CHEWABLE_TABLET | ORAL | Status: DC

## 2022-01-09 MED ORDER — CLOPIDOGREL BISULFATE 75 MG PO TABS
75.0000 mg | ORAL_TABLET | Freq: Every day | ORAL | Status: DC
  Administered 2022-01-10: 75 mg via ORAL
  Filled 2022-01-09: qty 1

## 2022-01-09 MED ORDER — HYDRALAZINE HCL 20 MG/ML IJ SOLN: 10.0000 mg | INTRAMUSCULAR | Status: AC | PRN

## 2022-01-09 MED ORDER — VERAPAMIL HCL 2.5 MG/ML IV SOLN
INTRAVENOUS | Status: AC
  Filled 2022-01-09: qty 2

## 2022-01-09 MED ORDER — INSULIN ASPART 100 UNIT/ML IJ SOLN
0.0000 [IU] | Freq: Three times a day (TID) | INTRAMUSCULAR | Status: DC
  Administered 2022-01-10: 2 [IU] via SUBCUTANEOUS

## 2022-01-09 MED ORDER — ISOSORBIDE MONONITRATE ER 30 MG PO TB24
30.0000 mg | ORAL_TABLET | Freq: Every day | ORAL | Status: DC
  Filled 2022-01-09: qty 1

## 2022-01-09 MED ORDER — SODIUM CHLORIDE 0.9 % WEIGHT BASED INFUSION
3.0000 mL/kg/h | INTRAVENOUS | Status: DC
  Administered 2022-01-09: 3 mL/kg/h via INTRAVENOUS

## 2022-01-09 MED ORDER — ZOLPIDEM TARTRATE 5 MG PO TABS
5.0000 mg | ORAL_TABLET | Freq: Every day | ORAL | Status: DC
  Administered 2022-01-09: 5 mg via ORAL
  Filled 2022-01-09: qty 1

## 2022-01-09 MED ORDER — NITROGLYCERIN 1 MG/10 ML FOR IR/CATH LAB
INTRA_ARTERIAL | Status: AC
  Filled 2022-01-09: qty 10

## 2022-01-09 MED ORDER — SODIUM CHLORIDE 0.9% FLUSH: 3.0000 mL | INTRAVENOUS | Status: DC | PRN

## 2022-01-09 MED ORDER — OXYCODONE-ACETAMINOPHEN 5-325 MG PO TABS
2.0000 | ORAL_TABLET | ORAL | Status: DC | PRN
  Administered 2022-01-09 – 2022-01-10 (×3): 2 via ORAL
  Filled 2022-01-09 (×2): qty 2

## 2022-01-09 MED ORDER — HEPARIN SODIUM (PORCINE) 1000 UNIT/ML IJ SOLN
INTRAMUSCULAR | Status: AC
  Filled 2022-01-09: qty 10

## 2022-01-09 MED ORDER — EZETIMIBE 10 MG PO TABS
10.0000 mg | ORAL_TABLET | Freq: Every day | ORAL | Status: DC
  Administered 2022-01-10: 10 mg via ORAL
  Filled 2022-01-09: qty 1

## 2022-01-09 MED ORDER — PANTOPRAZOLE SODIUM 40 MG PO TBEC
40.0000 mg | DELAYED_RELEASE_TABLET | Freq: Every day | ORAL | Status: DC
  Administered 2022-01-10: 40 mg via ORAL
  Filled 2022-01-09: qty 1

## 2022-01-09 MED ORDER — ONDANSETRON HCL 4 MG/2ML IJ SOLN
4.0000 mg | Freq: Four times a day (QID) | INTRAMUSCULAR | Status: DC | PRN
  Administered 2022-01-09: 4 mg via INTRAVENOUS

## 2022-01-09 MED ORDER — HEPARIN (PORCINE) IN NACL 1000-0.9 UT/500ML-% IV SOLN
INTRAVENOUS | Status: DC | PRN
  Administered 2022-01-09 (×3): 500 mL

## 2022-01-09 MED ORDER — CARVEDILOL 3.125 MG PO TABS
3.1250 mg | ORAL_TABLET | Freq: Two times a day (BID) | ORAL | Status: DC
  Filled 2022-01-09: qty 1

## 2022-01-09 MED ORDER — IOHEXOL 350 MG/ML SOLN
INTRAVENOUS | Status: DC | PRN
  Administered 2022-01-09: 110 mL

## 2022-01-09 MED ORDER — SODIUM CHLORIDE 0.9 % IV SOLN
INTRAVENOUS | Status: AC
  Administered 2022-01-09: 75 mL/h via INTRAVENOUS

## 2022-01-09 MED ORDER — ASPIRIN 81 MG PO TBEC
81.0000 mg | DELAYED_RELEASE_TABLET | Freq: Every day | ORAL | Status: DC
  Administered 2022-01-10: 81 mg via ORAL
  Filled 2022-01-09: qty 1

## 2022-01-09 SURGICAL SUPPLY — 33 items
BALL SAPPHIRE NC24 3.25X15 (BALLOONS) ×1
BALLN EMERGE MR 2.25X12 (BALLOONS) ×1
BALLOON EMERGE MR 2.25X12 (BALLOONS) IMPLANT
BALLOON SAPPHIRE NC24 3.25X15 (BALLOONS) IMPLANT
CATH INFINITI 5 FR IM (CATHETERS) IMPLANT
CATH INFINITI 5 FR MPA2 (CATHETERS) IMPLANT
CATH INFINITI 5FR JL4 (CATHETERS) IMPLANT
CATH INFINITI 6F MPA2 100CM (CATHETERS) IMPLANT
CATHETER LAUNCHER 6FR MP1 (CATHETERS) IMPLANT
DEVICE CLOSURE MYNXGRIP 6/7F (Vascular Products) IMPLANT
DEVICE RAD COMP TR BAND LRG (VASCULAR PRODUCTS) IMPLANT
DEVICE SPIDERFX EMB PROT 4MM (WIRE) IMPLANT
ELECT DEFIB PAD ADLT CADENCE (PAD) IMPLANT
GLIDESHEATH SLEND SS 6F .021 (SHEATH) IMPLANT
GUIDEWIRE INQWIRE 1.5J.035X260 (WIRE) IMPLANT
INQWIRE 1.5J .035X260CM (WIRE) ×1
KIT ENCORE 26 ADVANTAGE (KITS) IMPLANT
KIT HEART LEFT (KITS) ×1 IMPLANT
PACK CARDIAC CATHETERIZATION (CUSTOM PROCEDURE TRAY) ×1 IMPLANT
SHEATH GLIDE SLENDER 4/5FR (SHEATH) IMPLANT
SHEATH PINNACLE 6F 10CM (SHEATH) IMPLANT
SHIELD RADPAD SCOOP 12X17 (MISCELLANEOUS) IMPLANT
STENT SYNERGY XD 3.0X24 (Permanent Stent) IMPLANT
SYNERGY XD 3.0X24 (Permanent Stent) ×1 IMPLANT
SYR MEDRAD MARK 7 150ML (SYRINGE) ×1 IMPLANT
TRANSDUCER W/STOPCOCK (MISCELLANEOUS) ×1 IMPLANT
TUBING CIL FLEX 10 FLL-RA (TUBING) ×1 IMPLANT
WIRE COUGAR XT STRL 190CM (WIRE) IMPLANT
WIRE EMERALD 3MM-J .035X150CM (WIRE) IMPLANT
WIRE HI TORQ VERSACORE-J 145CM (WIRE) IMPLANT
WIRE MICRO SET 5FR 12 (WIRE) IMPLANT
WIRE MICRO SET SILHO 5FR 7 (SHEATH) IMPLANT
WIRE RUNTHROUGH .014X180CM (WIRE) IMPLANT

## 2022-01-09 NOTE — Progress Notes (Signed)
Hgb 11.2. Informed Wilmon Pali, MD. No order made. Plan of care ongoing.

## 2022-01-09 NOTE — Brief Op Note (Signed)
BRIEF CARDIAC CATHETERIZATION NOTE  DATE: 01/09/2022  TIME: 1:23 PM  PATIENT:  Elizabeth Park  51 y.o. female  PRE-OPERATIVE DIAGNOSIS:  Unstable angina pectoris  POST-OPERATIVE DIAGNOSIS:  Same  PROCEDURE:  Procedure(s): LEFT HEART CATH AND CORS/GRAFTS ANGIOGRAPHY (N/A) CORONARY STENT INTERVENTION (N/A)  SURGEON:  Surgeon(s) and Role:    * Lateefah Mallery, MD - Primary  FINDINGS: Severe native CAD including moderate-severe, diffuse proximal/mid LAD disease, 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 Widely patent LIMA-LAD. Chronic total occlusions of SVG-medial ramus branch and SVG-lateral ramus branch. 95% proximal graft stenosis involving SVG-PDA. Patent stented segment of LMCA->ramus with 20-30% in-stent restenosis. Patent stent in ostial/proximal SVG-PDA with 20% ISR. Patent stent in PLAV Normal LVEF and LVEDP. Small left radial artery necessitating use of 45F slender [place] and not suitable for PCI. Successful PCI to proximal/mid SVG-PDA using Synergy 3.0 x 24 mm drug-eluting stent that overlaps ostial graft stent with 0% residual stenosis and TIMI-3 flow. Right groin hematoma following MynxGrip deployment, controlled with manual compression.  RECOMMENDATIONS: Overnight observation. Continue indefinite DAPT with aspirin and clopidogrel as well as aggressive secondary prevention.  [name], MD [facility]

## 2022-01-09 NOTE — Progress Notes (Signed)
   Pt's hgb down from 01/04/22 so will repeat at 2300.  RN will call MD on call.  Site without change.

## 2022-01-09 NOTE — Progress Notes (Signed)
   Patient developed recurrent right femoral hematoma with manual compression applied by Cath Lab staff.  Pressure dressing was placed but there appears to be expansion of the hematoma.  Large hematoma was noted distal to the skin entry site.  I applied manual compression to the arteriotomy for 30 minutes and also applied pressure with the assistance of the Cath Lab staff to the hematoma.  At the Cindie Rajagopalan of compression, hematoma was softer without evidence of expansion.  Patient had significant tenderness with manual compression and received fentanyl as well as oxycodone/acetaminophen.  She is now asymptomatic.  She received a normal saline bolus as well due to transient hypotensionthat quickly resolved.  Exam notable for marked hematoma in the right groin below skin entry site for arteriotomy.  Right femoral artery pulse is 1+.  After 15 minutes of monitoring, hematoma does not appear to be enlarging post compression.  Patient will be admitted for overnight observation following PCI to SVG-PDA complicated by right groin hematoma.  I will check a CBC this evening as well as in the morning.  Lower extremity arterial Doppler also to be done tomorrow morning to ensure that there is not a pseudoaneurysm.  Continue aspirin and clopidogrel.  Patient and her fianc have been updated.  [name], MD [facility] 01/09/22 5:44 PM

## 2022-01-09 NOTE — Progress Notes (Addendum)
TR BAND REMOVAL LOCATION:    Left radial  DEFLATED PER PROTOCOL:    Yes.    TIME BAND OFF / DRESSING APPLIED:    1815pm A clean dry dressing applied  with gauze and tegaderm  SITE UPON ARRIVAL:    Level 0  SITE AFTER BAND REMOVAL:    Level 0  CIRCULATION SENSATION AND MOVEMENT:    Within Normal Limits   Yes.    COMMENTS:   Care instruction given to patient

## 2022-01-09 NOTE — Interval H&P Note (Signed)
History and Physical Interval Note:  01/09/2022 10:00 AM  Elizabeth Park  has presented today for surgery, with the diagnosis of unstable angina pectoris.  The various methods of treatment have been discussed with the patient and family. After consideration of risks, benefits and other options for treatment, the patient has consented to  Procedure(s): LEFT HEART CATH AND CORS/GRAFTS ANGIOGRAPHY (N/A) as a surgical intervention.  The patient's history has been reviewed, patient examined, no change in status, stable for surgery.  I have reviewed the patient's chart and labs.  Questions were answered to the patient's satisfaction.    Cath Lab Visit (complete for each Cath Lab visit)  Clinical Evaluation Leading to the Procedure:   ACS: No.  Non-ACS:    Anginal Classification: CCS IV  Anti-ischemic medical therapy: Maximal Therapy (2 or more classes of medications)  Non-Invasive Test Results: No non-invasive testing performed  Prior CABG: Previous CABG  Amen Dargis

## 2022-01-10 ENCOUNTER — Encounter (HOSPITAL_COMMUNITY): Payer: Self-pay | Admitting: Internal Medicine

## 2022-01-10 ENCOUNTER — Observation Stay (HOSPITAL_COMMUNITY): Payer: Medicare HMO

## 2022-01-10 DIAGNOSIS — I200 Unstable angina: Secondary | ICD-10-CM

## 2022-01-10 DIAGNOSIS — D62 Acute posthemorrhagic anemia: Secondary | ICD-10-CM

## 2022-01-10 DIAGNOSIS — I25700 Atherosclerosis of coronary artery bypass graft(s), unspecified, with unstable angina pectoris: Secondary | ICD-10-CM | POA: Diagnosis not present

## 2022-01-10 DIAGNOSIS — I110 Hypertensive heart disease with heart failure: Secondary | ICD-10-CM | POA: Diagnosis not present

## 2022-01-10 DIAGNOSIS — I97410 Intraoperative hemorrhage and hematoma of a circulatory system organ or structure complicating a cardiac catheterization: Secondary | ICD-10-CM

## 2022-01-10 DIAGNOSIS — Z955 Presence of coronary angioplasty implant and graft: Secondary | ICD-10-CM | POA: Diagnosis not present

## 2022-01-10 DIAGNOSIS — I5032 Chronic diastolic (congestive) heart failure: Secondary | ICD-10-CM | POA: Diagnosis not present

## 2022-01-10 LAB — CBC
HCT: 34.1 % — ABNORMAL LOW (ref 36.0–46.0)
Hemoglobin: 11.3 g/dL — ABNORMAL LOW (ref 12.0–15.0)
MCH: 31 pg (ref 26.0–34.0)
MCHC: 33.1 g/dL (ref 30.0–36.0)
MCV: 93.4 fL (ref 80.0–100.0)
Platelets: 248 K/uL (ref 150–400)
RBC: 3.65 MIL/uL — ABNORMAL LOW (ref 3.87–5.11)
RDW: 12.5 % (ref 11.5–15.5)
WBC: 8.2 K/uL (ref 4.0–10.5)
nRBC: 0 % (ref 0.0–0.2)

## 2022-01-10 LAB — BASIC METABOLIC PANEL WITH GFR
Anion gap: 7 (ref 5–15)
BUN: 16 mg/dL (ref 6–20)
CO2: 23 mmol/L (ref 22–32)
Calcium: 8.6 mg/dL — ABNORMAL LOW (ref 8.9–10.3)
Chloride: 105 mmol/L (ref 98–111)
Creatinine, Ser: 0.99 mg/dL (ref 0.44–1.00)
GFR, Estimated: >60 mL/min
Glucose, Bld: 186 mg/dL — ABNORMAL HIGH (ref 70–99)
Potassium: 3.9 mmol/L (ref 3.5–5.1)
Sodium: 135 mmol/L (ref 135–145)

## 2022-01-10 LAB — GLUCOSE, CAPILLARY
Glucose-Capillary: 116 mg/dL — ABNORMAL HIGH (ref 70–99)
Glucose-Capillary: 146 mg/dL — ABNORMAL HIGH (ref 70–99)
Glucose-Capillary: 147 mg/dL — ABNORMAL HIGH (ref 70–99)

## 2022-01-10 NOTE — Discharge Summary (Signed)
Discharge Summary    Patient ID: Elizabeth Park MRN: [id number]; DOB: 1970-12-20  Admit date: 01/09/2022 Discharge date: 01/10/2022  PCP:  Secundino Ginger, [place] [facility] Cardiologist:  Lauree Chandler, MD        Discharge Diagnoses    Principal Problem:   Unstable angina [facility]) Active Problems:   Type 2 diabetes mellitus with complication ([place])   Hyperlipidemia   Essential hypertension   CAD, ARTERY BYPASS GRAFT   S/P mitral valve repair   Femoral artery hematoma complicating cardiac catheterization   Acute blood loss anemia (ABLA)    Diagnostic Studies/Procedures    Cath 01/09/22 Severe native coronary artery disease including moderate-severe, diffuse mid LAD disease, 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 Widely patent LIMA-LAD. Chronic total occlusions of SVG-medial ramus branch and SVG-lateral ramus branch. 95% proximal graft stenosis involving SVG-PDA. Patent stented segment of LMCA->ramus with 20-30% in-stent restenosis. Patent stent in ostial/proximal SVG-PDA with 20% ISR. Patent stent in PLAV Normal LVEF (50-55%) and LVEDP (11 mmHg). Small left radial artery necessitating use of 61F slender Glidesheath and not suitable for PCI. Successful PCI to proximal/mid SVG-PDA using Synergy 3.0 x 24 mm drug-eluting stent that overlaps ostial graft stent with 0% residual stenosis and TIMI-3 flow. Right groin hematoma following MynxGrip deployment, controlled with manual compression.   Recommendations: Overnight observation. Continue indefinite DAPT with aspirin and clopidogrel as well as aggressive secondary prevention.   [name], MD [facility]   _____________   History of Present Illness     Elizabeth Park is a 51 y.o. female with CAD s/p 4V CABG 2011 with mitral valve repair for severe mitral regurgitation, DESx3 in 09/2016, DES to ramus intermediate  in 02/2017, DESx2 to intermediate branch and distal RCA through the SVG-RCA in 01/2020, DM, HTN, HLD, former tobacco abuse, chronic diastolic CHF who presented to the hospital for outpatient catheterization due to recent waxing/waning chest pain concerning for recurrent angina. See office note 01/04/22 for more detailed history.  Hospital Course     1. CAD/angina pectoris s/p PCI this admission complicated by right femoral hematoma and mild ABL anemia - s/p successful PCI to proximal/mid SVG-PDA using Synergy 3.0 x 24 mm drug-eluting stent that overlaps ostial graft stent, otherwise cath as outlined above, EF 50-55% with LVEDP 61mHg - procedure complicated by right groin hematoma requiring manual compression - pre-cath Hgb 13.8, maintaining in the mid 11's and remaining stable overnight - groin duplex this AM compatible with hematoma but no pseudoaneurysm or AV fistula - per MD review, OK to dc with prelim report - patient feels well and is eager for discharge - recommended to continue indefinite DAPT with aspirin and clopidogrel (on prior to admission) as well as aggressive secondary prevention - she was advised to hold carvedilol and isosorbide today and recheck BP at home - when improved to 110 or greater, can restart these medicines - she was advised to otherwise give [place] update later this week how her blood pressure is running   2. Essential HTN - BP softer than prior in setting of post-cath anemia, med plan outlined above   3. Chronic diastolic CHF - euvolemic, LVEDP normal on cath   4. DM - advised to hold metformin x 48 hours post cath, to resume 10/12 - otherwise maintained on Tresiba long acting insulin, SSI, Monjouro - consider SGLT2i in follow-up if BP improved  5.  Pre-op evaluation - was recently pending breast reduction surgery as OP that she reported got cancelled due to A1C - now that she is fresh off PCI, this will need to be deferred until cleared to hold antiplatelets  by primary cardiologist - she is otherwise intended for indefinite DAPT with aspirin and clopidogrel   6. HLD goal LDL <70 - last lipid profile 11/18/21 with LDL 50 but triglycerides 212. Vascepa recommended at that time. Patient wished to try lifestyle modifications with f/u labs 02/2022 -> would revisit if triglycerides not at goal on recheck - continue Zetia, rosuvastatin at PTA doses   7. H/o MV repair - stable with only mild mitral stenosis by repeat echo 11/18/21, no significant change reported from prior study - SBE ppx has been reviewed at each visit in OP setting    Dr. Harrell Gave has seen and examined the patient today and feels she is stable for discharge.      Did the patient have an acute coronary syndrome (MI, NSTEMI, STEMI, etc) this admission?:  No                               Did the patient have a percutaneous coronary intervention (stent / angioplasty)?:  Yes.     Cath/PCI Registry Performance & Quality Measures: Aspirin prescribed? - Yes ADP Receptor Inhibitor (Plavix/Clopidogrel, Brilinta/Ticagrelor or Effient/Prasugrel) prescribed (includes medically managed patients)? - Yes High Intensity Statin (Lipitor 40-27m or Crestor 20-454m prescribed? - Yes For EF <40%, was ACEI/ARB prescribed? - Not Applicable (EF >/= 4002%For EF <40%, Aldosterone Antagonist (Spironolactone or Eplerenone) prescribed? - Not Applicable (EF >/= 4063%Cardiac Rehab Phase II ordered? - Yes         _____________  Discharge Vitals Blood pressure 93/63, pulse (!) 59, temperature 98.7 F (37.1 C), temperature source Oral, resp. rate 14, height _0  (1.626 m), weight 91.2 kg, SpO2 95 %.  Filed Weights   01/09/22 0841  Weight: 91.2 kg    Labs & Radiologic Studies    CBC Recent Labs    01/09/22 2249 01/10/22 0044  WBC 9.4 8.2  HGB 11.2* 11.3*  HCT 31.7* 34.1*  MCV 90.6 93.4  PLT 250 24785 Basic Metabolic Panel Recent Labs    01/10/22 0044  NA 135  K 3.9  CL 105  CO2 23   GLUCOSE 186*  BUN 16  CREATININE 0.99  CALCIUM 8.6*    VAS USKoreaROIN PSEUDOANEURYSM  Result Date: 01/10/2022  ARTERIAL PSEUDOANEURYSM  Patient Name:  Elizabeth Park  Date of Exam:   01/10/2022 Medical Rec #: [id number]  Accession #:    [id number] of Birth: 9/November 09, 1970  Patient Gender: F Patient Age:   [age]ears [facility]      VAS US[place]RGloriajean Delleferring Phys: CHHarrell GaveND --------------------------------------------------------------------------------  Exam: Right groin Indications: Patient complains of groin pain and bruising. History: S/p catheterization. Comparison Study: No prior study Performing Technologist: MiMaudry MayhewHA, RDMS, RVT, RDCS  Examination Guidelines: A complete evaluation includes B-mode imaging, spectral Doppler, color Doppler, and power Doppler as needed of all accessible portions of each vessel. Bilateral testing is considered an integral part of a complete examination. Limited examinations for reoccurring indications may be performed as noted. +------------+----------+----------+------+----------+ Right DuplexPSV (cm/s) Waveform PlaqueComment(s) +------------+----------+----------+------+----------+ CFA            123  biphasic                  +------------+----------+----------+------+----------+ PFA             54    monophasic                 +------------+----------+----------+------+----------+ Prox SFA       115    monophasic                 +------------+----------+----------+------+----------+ Right Vein comments:patent right CFV  Findings: Multiple mixed echogenic structures are visualized at the right groin with ultrasound characteristics of a hematoma, with the largest measuring approximately 1.8 x 0.6cm. No evidence of active pseudoaneurysm or AVF.    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 Widely patent LIMA-LAD. Chronic total occlusions of SVG-medial ramus branch and SVG-lateral ramus branch. 95% proximal graft stenosis involving SVG-PDA. Patent stented segment of LMCA->ramus with 20-30% in-stent restenosis. Patent stent in ostial/proximal SVG-PDA with 20% ISR. Patent stent in PLAV Normal LVEF (50-55%) and LVEDP (11 mmHg). Small left radial artery necessitating use of 84F slender Glidesheath and not suitable for PCI. Successful PCI to proximal/mid SVG-PDA using Synergy 3.0 x 24 mm drug-eluting stent that overlaps ostial graft stent with 0% residual stenosis and TIMI-3 flow. Right groin hematoma following MynxGrip deployment, controlled with manual compression.  Recommendations: Overnight observation. Continue indefinite DAPT with aspirin and clopidogrel as well as aggressive secondary prevention. [name], MD [facility]  Disposition   Pt is being discharged home today in good condition.  Follow-up Plans & Appointments     Follow-up Information     Charlie Pitter, PA-C Follow up.   Specialties: Cardiology, Radiology Why: Keep follow-up as scheduled on Wednesday Feb 01, 2022 9:15 AM (Arrive by 9:00 AM). Contact information: [address] [place] [address] [place] [address] [phone]                Discharge Instructions     Amb Referral to Cardiac Rehabilitation   Complete by: As directed    Diagnosis: Coronary Stents   After initial evaluation and assessments completed: Virtual Based Care may be provided alone or in conjunction with Phase 2 Cardiac Rehab based on patient barriers.: Yes   Intensive Cardiac Rehabilitation (ICR) [place] location only OR Traditional Cardiac Rehabilitation (TCR) *If criteria for ICR are not met will  enroll in TCR [facility] only): Yes   Diet - low sodium heart healthy   Complete by: As directed    Discharge instructions   Complete by: As directed    Please do not take any carvedilol or isosorbide today. You should recheck your blood pressure tomorrow morning. If it has improved to greater than 110 on the top number, you can restart these medicines. If your blood pressure remains less than 110, you should stay off these medicines until it recovers to above 110. Please give [place] an update later this week how your blood pressure is running.  IMPORTANT: After a heart catheterization, you will need to avoid taking metformin for at least 48 hours. We would recommend you restart this on 01/12/22.  Ondansetron (Zofran) was removed from your medicine list since you indicated you're no longer taking this.  Regarding your breast surgery, please let your surgeon know  you recently had to undergo stenting. We cannot typically stop your blood thinners for 6-12 months following this procedure. You will need to be seen in follow-up to determine when it is safe to do so. Do not stop your blood thinners without talking to our office first.   Increase activity slowly   Complete by: As directed    No driving for 1 week. No lifting over 10 lbs for 2 weeks. No sexual activity for 2 weeks. You may return to work in 1 week if applicable. Keep procedure site clean & dry. If you notice increased pain, swelling, bleeding or pus, call/return!  You may shower, but no soaking baths/hot tubs/pools for 1 week.        Discharge Medications   Allergies as of 01/10/2022       Reactions   Metformin Diarrhea, Nausea And Vomiting   Immediate release tablets (not metformin ER, pt currently taking)   Wound Dressing Adhesive Other (See Comments)   Burns skin/ bandages        Medication List     STOP taking these medications    ondansetron 4 MG tablet Commonly known as: ZOFRAN       TAKE these medications     aspirin 81 MG tablet Take 81 mg by mouth daily.   carvedilol 3.125 MG tablet Commonly known as: COREG TAKE 1 TABLET BY MOUTH TWICE A DAY Notes to patient: Please do not take any carvedilol or isosorbide today. You should recheck your blood pressure tomorrow morning. If it has improved to greater than 110 on the top number, you can restart these medicines. If your blood pressure remains less than 110, you should stay off these medicines until it recovers to above 110. Please give [place] an update later this week how your blood pressure is running.   clopidogrel 75 MG tablet Commonly known as: PLAVIX TAKE 1 TABLET BY MOUTH EVERY DAY WITH BREAKFAST   ezetimibe 10 MG tablet Commonly known as: ZETIA Take 1 tablet (10 mg total) by mouth daily.   FreeStyle Libre 2 Sensor Misc Apply 1 Device topically every 14 (fourteen) days. Wears continuously changes every 14 days   gabapentin 300 MG capsule Commonly known as: NEURONTIN Take 300 mg by mouth 3 (three) times daily.   insulin aspart 100 UNIT/ML injection Commonly known as: novoLOG Inject 4-10 Units into the skin See admin instructions. Per sliding scale Under 150 no units 150-199 take 8 units 200-249 take 10 units 250-299 take 12 units   isosorbide mononitrate 30 MG 24 hr tablet Commonly known as: IMDUR Take 1 tablet (30 mg total) by mouth daily. Notes to patient: Please do not take any carvedilol or isosorbide today. You should recheck your blood pressure tomorrow morning. If it has improved to greater than 110 on the top number, you can restart these medicines. If your blood pressure remains less than 110, you should stay off these medicines until it recovers to above 110. Please give [place] an update later this week how your blood pressure is running.   metFORMIN 500 MG 24 hr tablet Commonly known as: GLUCOPHAGE-XR Take 500 mg by mouth in the morning and at bedtime. Notes to patient: IMPORTANT!!!! After a heart catheterization, you will  need to avoid taking metformin for at least 48 hours. We would recommend you restart this on 01/12/22.   Mounjaro 5 MG/0.5ML Pen Generic drug: tirzepatide Inject 5 mg into the skin once a week.   nitroGLYCERIN 0.4 MG SL tablet Commonly  known as: NITROSTAT PLACE 1 TABLET UNDER THE TONGUE EVERY 5 MINUTES AS NEEDED FOR CHEST PAIN (UP TO 3 DOSES/15MIN)   oxyCODONE-acetaminophen 10-325 MG tablet Commonly known as: PERCOCET Take 1 tablet by mouth 5 (five) times daily.   pantoprazole 40 MG tablet Commonly known as: PROTONIX Take 1 tablet (40 mg total) by mouth daily.   rosuvastatin 40 MG tablet Commonly known as: CRESTOR TAKE 1 TABLET (40 MG TOTAL) EVERY EVENING BY MOUTH.   sertraline 25 MG tablet Commonly known as: ZOLOFT Take 25 mg by mouth daily.   Tyler Aas FlexTouch 100 UNIT/ML FlexTouch Pen Generic drug: insulin degludec Inject 60 Units as directed at bedtime.   zolpidem 5 MG tablet Commonly known as: AMBIEN TAKE 1 TABLET BY MOUTH EVERY DAY AT BEDTIME AS NEEDED FOR SLEEP What changed:  how much to take how to take this when to take this           Outstanding Labs/Studies   Consider CBC in follow-up F/u lipids scheduled for 02/2022  Duration of Discharge Encounter   Greater than 30 minutes including physician time.  Signed, Charlie Pitter, PA-C 01/10/2022, 1:47 PM

## 2022-01-10 NOTE — Progress Notes (Signed)
CARDIAC REHAB PHASE I   Pt resting in bed feeling well this morning. Awaiting groin ultrasound to assess cath site before ambulation. Post stent teaching including site care, risk factors, exercise guidelines, hearth healthy diabetic diet, restrictions, antiplatelet therapy, and CRP2 reviewed. All questions and concerns addressed. Will refer to [facility] for CRP2. Pt is hopeful for discharge later today.   [phone]  Vanessa Barbara, RN BSN 01/10/2022 8:45 AM

## 2022-01-10 NOTE — Plan of Care (Signed)
  Problem: Cardiovascular: Goal: Ability to achieve and maintain adequate cardiovascular perfusion will improve Outcome: Progressing Goal: Vascular access site(s) Level 0-1 will be maintained Outcome: Progressing   Problem: Cardiac: Goal: Ability to achieve and maintain adequate cardiopulmonary perfusion will improve Outcome: Progressing   Problem: Health Behavior/Discharge Planning: Goal: Ability to identify and utilize available resources and services will improve Outcome: Progressing Goal: Ability to manage health-related needs will improve Outcome: Progressing   Problem: Pain Managment: Goal: General experience of comfort will improve Outcome: Progressing   Problem: Safety: Goal: Ability to remain free from injury will improve Outcome: Progressing   Problem: Skin Integrity: Goal: Risk for impaired skin integrity will decrease Outcome: Progressing

## 2022-01-10 NOTE — Progress Notes (Signed)
Progress Note  Patient Name: Elizabeth Park Date of Encounter: 01/10/2022  Primary Cardiologist: Lauree Chandler, MD  Subjective   Feeling well. Groin feels better. No CP or SOB. Hopeful for DC.  Inpatient Medications    Scheduled Meds:  aspirin EC  81 mg Oral Daily   carvedilol  3.125 mg Oral BID WC   clopidogrel  75 mg Oral Daily   ezetimibe  10 mg Oral Daily   gabapentin  300 mg Oral TID   insulin aspart  0-15 Units Subcutaneous TID WC   insulin glargine-yfgn  60 Units Subcutaneous QHS   isosorbide mononitrate  30 mg Oral Daily   pantoprazole  40 mg Oral Daily   rosuvastatin  40 mg Oral QHS   sertraline  25 mg Oral Daily   sodium chloride flush  3 mL Intravenous Q12H   zolpidem  5 mg Oral QHS   Continuous Infusions:  sodium chloride     PRN Meds: sodium chloride, acetaminophen, nitroGLYCERIN, ondansetron (ZOFRAN) IV, oxyCODONE-acetaminophen, sodium chloride flush   Vital Signs    Vitals:   01/09/22 2200 01/10/22 0035 01/10/22 0424 01/10/22 0738  BP: '90/61 99/65 94/61 '$ (!) 108/58  Pulse: 63 (!) 52 (!) 51 (!) 59  Resp: '18 17 17 14  '$ Temp:  97.8 F (36.6 C) 98.7 F (37.1 C) 98.7 F (37.1 C)  TempSrc:  Oral Oral Oral  SpO2:  98% 97% 95%  Weight:      Height:        Intake/Output Summary (Last 24 hours) at 01/10/2022 0831 Last data filed at 01/09/2022 2200 Gross per 24 hour  Intake 240 ml  Output 1000 ml  Net -760 ml      01/09/2022    8:41 AM 01/04/2022    9:45 AM 11/04/2021    3:32 PM  Last 3 Weights  Weight (lbs) 201 lb 201 lb 9.6 oz 204 lb 6.4 oz  Weight (kg) 91.173 kg 91.445 kg 92.715 kg     Telemetry    NSR - Personally Reviewed  ECG    SB 51bpm, nonspecific TW changes - Personally Reviewed  Physical Exam   GEN: No acute distress.  HEENT: Normocephalic, atraumatic, sclera non-icteric. Neck: No JVD or bruits. Cardiac: RRR no murmurs, rubs, or gallops.  Respiratory: Clear to auscultation bilaterally. Breathing is unlabored. GI: Soft,  nontender, non-distended, BS +x 4. MS: no deformity. Extremities: No clubbing or cyanosis. No edema. Distal pedal pulses are 2+ and equal bilaterally. Right groin cath site with moderate ecchymosis in the area of the right groin/hip, soft, no oozing, enlargement or bruit Neuro:  AAOx3. Follows commands. Psych:  Responds to questions appropriately with a normal affect.  Labs    High Sensitivity Troponin:  No results for input(s): "TROPONINIHS" in the last 720 hours.    Cardiac EnzymesNo results for input(s): "TROPONINI" in the last 168 hours. No results for input(s): "TROPIPOC" in the last 168 hours.   Chemistry Recent Labs  Lab 01/04/22 1047 01/10/22 0044  NA 138 135  K 4.6 3.9  CL 103 105  CO2 28 23  GLUCOSE 131* 186*  BUN 16 16  CREATININE 0.95 0.99  CALCIUM 9.8 8.6*  GFRNONAA  --  >60  ANIONGAP  --  7     Hematology Recent Labs  Lab 01/09/22 1904 01/09/22 2249 01/10/22 0044  WBC 12.3* 9.4 8.2  RBC 3.58* 3.50* 3.65*  HGB 11.3* 11.2* 11.3*  HCT 32.4* 31.7* 34.1*  MCV 90.5 90.6 93.4  MCH  31.6 32.0 31.0  MCHC 34.9 35.3 33.1  RDW 12.4 12.5 12.5  PLT 248 250 248    BNPNo results for input(s): "BNP", "PROBNP" in the last 168 hours.   DDimer No results for input(s): "DDIMER" in the last 168 hours.   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 Widely patent LIMA-LAD. Chronic total occlusions of SVG-medial ramus branch and SVG-lateral ramus branch. 95% proximal graft stenosis involving SVG-PDA. Patent stented segment of LMCA->ramus with 20-30% in-stent restenosis. Patent stent in ostial/proximal SVG-PDA with 20% ISR. Patent stent in PLAV Normal LVEF (50-55%) and LVEDP (11 mmHg). Small left radial artery necessitating use of 11F slender  Glidesheath and not suitable for PCI. Successful PCI to proximal/mid SVG-PDA using Synergy 3.0 x 24 mm drug-eluting stent that overlaps ostial graft stent with 0% residual stenosis and TIMI-3 flow. Right groin hematoma following MynxGrip deployment, controlled with manual compression.  Recommendations: Overnight observation. Continue indefinite DAPT with aspirin and clopidogrel as well as aggressive secondary prevention. 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 Widely patent LIMA-LAD. Chronic total occlusions of SVG-medial ramus branch and SVG-lateral ramus branch. 95% proximal graft stenosis involving SVG-PDA. Patent stented segment of LMCA->ramus with 20-30% in-stent restenosis. Patent stent in ostial/proximal SVG-PDA with 20% ISR. Patent stent in PLAV Normal LVEF (50-55%) and LVEDP (11 mmHg). Small left radial artery necessitating use of 11F slender Glidesheath and not suitable for PCI. Successful PCI to proximal/mid SVG-PDA using Synergy 3.0 x 24 mm drug-eluting stent that overlaps ostial graft stent with 0% residual stenosis and TIMI-3 flow. Right groin hematoma following MynxGrip deployment, controlled with manual compression.   Recommendations: Overnight observation. Continue indefinite DAPT with aspirin and clopidogrel as well as aggressive secondary prevention.   Elizabeth Bush, MD [facility]  Patient Profile     51 y.o. female with CAD s/p 4V CABG 2011 with mitral valve repair for severe mitral regurgitation, DESx3 in 09/2016, DES to ramus intermediate in 02/2017, DESx2 to intermediate branch and distal RCA through the SVG-RCA in 01/2020, DM, HTN, HLD, former tobacco abuse, chronic diastolic CHF who presented to the hospital for outpatient  catheterization due to recent chest pain concerning for angina.  Assessment & Plan    1. CAD/angina pectoris s/p PCI this admission complicated by right femoral hematoma and mild ABL anemia - s/p Successful PCI to proximal/mid SVG-PDA using Synergy 3.0 x 24 mm drug-eluting stent that overlaps ostial graft stent, otherwise cath as outlined above, EF 50-55% with LVEDP 24mHg - procedure complicated by right groin hematoma requiring manual compression - pre-cath Hgb 13.8, maintaining in the mid 11's - await pseudoaneurysm duplex this AM - hopeful for discharge if this looks OK  2. HLD goal LDL <70 - last lipid profile 11/18/21 with LDL 50 but triglycerides 212. Vascepa recommended at that time. Patient wished to try lifestyle modifications with f/u labs 02/2022. Would likely recommend at discharge given progression of disease, will review with MD. Otherwise continue Zetia, rosuvastatin at PTA doses  3. H/o MV repair - stable with only mild mitral stenosis by repeat echo 11/18/21, no significant change reported from prior study  4. Essential HTN - BP soft at times, will hold carvedilol + Imdur and review dc regimen with MD  5. Chronic diastolic CHF - euvolemic  6. DM - metformin held due to cath, otherwise maintained on Tresiba long acting insulin, SSI, Monjouro - consider SGLT2i in follow-up if BP ok  Has f/u with me 02/01/22.  For questions or updates, please contact [place] Please consult [email or web address] for contact info under Cardiology/STEMI.  Signed, Charlie Pitter, PA-C 01/10/2022, 8:31 AM

## 2022-01-11 LAB — LIPOPROTEIN A (LPA): Lipoprotein (a): 465 nmol/L — ABNORMAL HIGH (ref <75.0)

## 2022-01-30 NOTE — Progress Notes (Signed)
Cardiology Office Note    Date:  02/01/2022   ID:  Elizabeth Park, DOB [birth year], MRN [id number]  PCP:  Coralee Rud, PA-C  Cardiologist:  Verne Carrow, MD  Electrophysiologist:  None   Chief Complaint: f/u cath  History of Present Illness:   Elizabeth Park is a 51 y.o. female with history of CAD s/p 4V CABG 2011 with mitral valve repair for severe mitral regurgitation, DESx3 in 09/2016, DES to ramus intermediate in 02/2017, DESx2 to intermediate branch and distal RCA through the SVG-RCA in 01/2020, DES to SVG-PDA 01/2022, DM, HTN, HLD, former tobacco abuse, chronic diastolic CHF who is seen for post-cath follow-up. She was recently referred for outpatient catheterization due to recent waxing/waning chest pain concerning for recurrent angina. Cardiac cath resulted in subsequent successful PCI to proximal/mid SVG-PDA using Synergy 3.0 x 24 mm drug-eluting stent that overlaps ostial graft stent, otherwise cath as outlined below, EF 50-55% with LVEDP . Post-cath course complicated by right femoral hematoma and mild ABL anemia without evidence for pseudoaneurysm/AVF by duplex. Of note, prior to this intervention, she was seeking elective breast reduction surgery but this is now cancelled. She is not on SGLT2i due to this previously causing recurrent yeast infections.  She is seen for follow-up today. She reports 1 episode of interim chest discomfort that happened in the middle of the night on Saturday into Sunday, lasting a few minutes, heartburn like. She took 1 ASA and symptoms gradually subsided. No associated symptoms. She has not had any exertional angina, dyspnea, diaphoresis, palpitations, or dizziness. The research team called her to set up a visit later this month to discuss research study for her elevated Lp(a). She is concerned about a knot in her right groin as well as some continued bruising along her right thigh with associated tenderness. There is continued ecchymosis but no  evidence of skin breakdown, erythema or infection.   Labwork independently reviewed: 01/2022 Lpa 465, Hgb 11.3, plt 248, K 3.9, Cr 0.99 12/2021 A1c 7.5 11/2021 Trig 212, LDL 50  Cardiology Studies:   Studies reviewed are outlined and summarized above. Reports included below if pertinent.   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 Widely patent LIMA-LAD. Chronic total occlusions of SVG-medial ramus branch and SVG-lateral ramus branch. 95% proximal graft stenosis involving SVG-PDA. Patent stented segment of LMCA->ramus with 20-30% in-stent restenosis. Patent stent in ostial/proximal SVG-PDA with 20% ISR. Patent stent in PLAV Normal LVEF (50-55%) and LVEDP (11 mmHg). Small left radial artery necessitating use of 53F slender Glidesheath and not suitable for PCI. Successful PCI to proximal/mid SVG-PDA using Synergy 3.0 x 24 mm drug-eluting stent that overlaps ostial graft stent with 0% residual stenosis and TIMI-3 flow. Right groin hematoma following MynxGrip deployment, controlled with manual compression.   Recommendations: Overnight observation. Continue indefinite DAPT with aspirin and clopidogrel as well as aggressive secondary prevention.   Yvonne Kendall, MD [facility]   Echo 11/18/21    1. Left ventricular ejection fraction, by estimation, is 60 to 65%. The  left ventricle has normal function. The left ventricle has no regional  wall motion abnormalities. Left ventricular diastolic function could not  be evaluated.   2. Right ventricular systolic function is normal. The right ventricular  size is normal.   3. The mitral valve has been repaired/replaced. No evidence of mitral  valve regurgitation.  Mild mitral stenosis. The mean mitral valve gradient  is 5.0 mmHg. There is a prosthetic  annuloplasty ring present in the mitral  position. Procedure Date: 2011.   4. The aortic valve is grossly normal. Aortic valve regurgitation is not  visualized. No aortic stenosis is present.   5. The inferior vena cava is normal in size with greater than 50%  respiratory variability, suggesting right atrial pressure of 3 mmHg.   Comparison(s): No significant change from prior study.     Past Medical History:  Diagnosis Date   Chronic diastolic CHF (congestive heart failure) (HCC)    Coronary artery disease    a. 4v CABG  (LIMA to LAD, SVG to subbranch of ramus intermediate, SVG to lateral subbranch of ramus intermediate, SVG to distal RCA) and mitral valve repair January 2011. b. Abnormal nuc 09/2016 s/p  drug eluting stent to the vein graft to the PDA, a drug eluting stent in the native Ramus intermediate branch and a drug eluting stent in the protected left main. c. DES to ramus intermediate 02/2017.    Diabetes mellitus    H/O emotional problems    Headache, migraine    High cholesterol    History of blood transfusion    History of nuclear stress test    Myoview 07/2019: EF 60, normal perfusion, low risk.   Hypertension    MI, old    MRSA (methicillin resistant Staphylococcus aureus)    MVP (mitral valve prolapse)    Neuromuscular disorder (HCC)    Neuropathy    S/P mitral valve repair    Sebaceous cyst     Past Surgical History:  Procedure Laterality Date   ABDOMINAL HYSTERECTOMY     CARDIAC CATHETERIZATION  09/22/2016   CORONARY ARTERY BYPASS GRAFT  with valve repair   CORONARY STENT INTERVENTION N/A 09/22/2016   Procedure: Coronary Stent Intervention;  Surgeon: Kathleene Hazel, MD;  Location: [facility] INVASIVE CV LAB;  Service: Cardiovascular;  Laterality: N/A;   CORONARY STENT INTERVENTION N/A 02/07/2017   Procedure: CORONARY STENT INTERVENTION;  Surgeon: [place], Peter M, MD;  Location: [facility] INVASIVE CV LAB;  Service: Cardiovascular;  Laterality: N/A;   CORONARY STENT  INTERVENTION N/A 01/30/2020   Procedure: CORONARY STENT INTERVENTION;  Surgeon: Kathleene Hazel, MD;  Location: [facility] INVASIVE CV LAB;  Service: Cardiovascular;  Laterality: N/A;   CORONARY STENT INTERVENTION N/A 01/09/2022   Procedure: CORONARY STENT INTERVENTION;  Surgeon: Yvonne Kendall, MD;  Location: [facility] INVASIVE CV LAB;  Service: Cardiovascular;  Laterality: N/A;   CORONARY STENT PLACEMENT  09/22/2016   LEFT HEART CATH AND CORS/GRAFTS ANGIOGRAPHY N/A 09/22/2016   Procedure: Left Heart Cath and Cors/Grafts Angiography;  Surgeon: Kathleene Hazel, MD;  Location: [facility] INVASIVE CV LAB;  Service: Cardiovascular;  Laterality: N/A;   LEFT HEART CATH AND CORS/GRAFTS ANGIOGRAPHY N/A 02/07/2017   Procedure: LEFT HEART CATH AND CORS/GRAFTS ANGIOGRAPHY;  Surgeon: [place], Peter M, MD;  Location: [facility] INVASIVE CV LAB;  Service: Cardiovascular;  Laterality: N/A;   LEFT HEART CATH AND CORS/GRAFTS ANGIOGRAPHY N/A 01/30/2020   Procedure: LEFT HEART CATH AND CORS/GRAFTS ANGIOGRAPHY;  Surgeon: Kathleene Hazel, MD;  Location: [facility] INVASIVE CV LAB;  Service: Cardiovascular;  Laterality: N/A;   LEFT HEART CATH AND CORS/GRAFTS ANGIOGRAPHY N/A 01/09/2022   Procedure: LEFT HEART CATH AND CORS/GRAFTS ANGIOGRAPHY;  Surgeon: Yvonne Kendall, MD;  Location: [facility] INVASIVE CV LAB;  Service: Cardiovascular;  Laterality: N/A;   SKIN GRAFT      Current Medications: Current Meds  Medication Sig   aspirin 81 MG tablet Take 81 mg by mouth daily.    carvedilol (COREG) 3.125 MG tablet TAKE 1 TABLET BY MOUTH TWICE A DAY   clopidogrel (PLAVIX) 75 MG tablet TAKE 1 TABLET BY MOUTH EVERY DAY WITH BREAKFAST   Continuous Blood Gluc Sensor (FREESTYLE LIBRE 2 SENSOR) MISC Apply 1 Device topically every 14 (fourteen) days. Wears continuously changes every 14 days   ezetimibe (ZETIA) 10 MG tablet Take 1 tablet (10 mg total) by mouth daily.   gabapentin (NEURONTIN) 300 MG capsule Take 300 mg by mouth 3 (three) times daily.     insulin aspart (NOVOLOG) 100 UNIT/ML injection Inject 4-10 Units into the skin See admin instructions. Per sliding scale Under 150 no units 150-199 take 8 units 200-249 take 10 units 250-299 take 12 units   isosorbide mononitrate (IMDUR) 30 MG 24 hr tablet Take 1 tablet (30 mg total) by mouth daily.   metFORMIN (GLUCOPHAGE-XR) 500 MG 24 hr tablet Take 500 mg by mouth in the morning and at bedtime.    MOUNJARO 5 MG/0.5ML Pen Inject 5 mg into the skin once a week.   nitroGLYCERIN (NITROSTAT) 0.4 MG SL tablet PLACE 1 TABLET UNDER THE TONGUE EVERY 5 MINUTES AS NEEDED FOR CHEST PAIN (UP TO 3 DOSES/15MIN)   oxyCODONE-acetaminophen (PERCOCET) 10-325 MG per tablet Take 1 tablet by mouth 5 (five) times daily.    pantoprazole (PROTONIX) 40 MG tablet Take 1 tablet (40 mg total) by mouth daily.   rosuvastatin (CRESTOR) 40 MG tablet TAKE 1 TABLET (40 MG TOTAL) EVERY EVENING BY MOUTH.   sertraline (ZOLOFT) 25 MG tablet Take 25 mg by mouth daily.   TRESIBA FLEXTOUCH 100 UNIT/ML SOPN FlexTouch Pen Inject 60 Units as directed at bedtime.    [DISCONTINUED] zolpidem (AMBIEN) 5 MG tablet TAKE 1 TABLET BY MOUTH EVERY DAY AT BEDTIME AS NEEDED FOR SLEEP (Patient taking differently: Take 5 mg by mouth at bedtime. TAKE 1 TABLET BY MOUTH EVERY DAY AT BEDTIME AS NEEDED FOR SLEEP)      Allergies:   Metformin, Jardiance [empagliflozin], and Wound dressing adhesive   Social History   Socioeconomic History   Marital status: Divorced    Spouse name: Not on file   Number of children: 2   Years of education: Not on file   Highest education level: Not on file  Occupational History   Occupation: Disability  Tobacco Use   Smoking status: Not on file   Smokeless tobacco: Never  Vaping Use   Vaping Use: Never used  Substance and Sexual Activity   Alcohol use: No   Drug use: No   Sexual activity: Yes    Birth control/protection: Surgical    Comment: partial hysterectomy  Other Topics Concern   Not on file  Social  History Narrative   Not on file   Social Determinants of Health   Financial Resource Strain: Not on file  Food Insecurity: Not on file  Transportation Needs: No Transportation Needs (04/10/2017)   PRAPARE - Administrator, Civil Service (Medical): No    Lack of Transportation (Non-Medical): No  Physical Activity: Inactive (04/10/2017)   Exercise Vital Sign    Days of Exercise per Week: 0 days    Minutes of Exercise per Session: 0 min  Stress: No Stress Concern Present (04/10/2017)   [employer] of Occupational Health - Occupational Stress Questionnaire    Feeling of Stress : Only a little  Social Connections: Not on file  Family History:  The patient's family history includes Arthritis in her father; Asthma in her father; COPD in her father; Diabetes in her brother, father, and mother; Emphysema in her maternal grandmother; Heart disease in her father and mother; Hypertension in her brother, father, and mother; Migraines in her mother; Stroke in her mother. There is no history of Breast cancer.  ROS:   Please see the history of present illness.  All other systems are reviewed and otherwise negative.    EKG(s)/Additional Labs   EKG:  EKG is ordered today, personally reviewed, demonstrating sinus bradycardia 53bpm, nonspecific STT changes similar to prior.  Recent Labs: 11/18/2021: ALT 14 01/10/2022: BUN 16; Creatinine, Ser 0.99; Hemoglobin 11.3; Platelets 248; Potassium 3.9; Sodium 135  Recent Lipid Panel    Component Value Date/Time   CHOL 130 11/18/2021 0745   TRIG 212 (H) 11/18/2021 0745   HDL 46 11/18/2021 0745   CHOLHDL 2.8 11/18/2021 0745   CHOLHDL 8.9 04/03/2009 0320   VLDL 54 (H) 04/03/2009 0320   LDLCALC 50 11/18/2021 0745    PHYSICAL EXAM:    VS:  BP 132/80   Pulse (!) 53   Ht 5\' 4"  (1.626 m)   Wt 206 lb 12.8 oz (93.8 kg)   SpO2 96%   BMI 35.50 kg/m   BMI: Body mass index is 35.5 kg/m.  GEN: Well nourished, well developed female in no  acute distress HEENT: normocephalic, atraumatic Neck: no JVD, carotid bruits, or masses Cardiac: RRR; no murmurs, rubs, or gallops, no edema  Respiratory:  clear to auscultation bilaterally, normal work of breathing GI: soft, nontender, nondistended, + BS MS: no deformity or atrophy Skin: warm and dry, no skin breakdown. + mild focal ecchymosis along lateral right upper thigh as well as posterior right hip, marble sized nonpulsatile nodule in R groin without associated groin ecchymosis or bruit Neuro:  Alert and Oriented x 3, Strength and sensation are intact, follows commands Psych: euthymic mood, full affect  Wt Readings from Last 3 Encounters:  02/01/22 206 lb 12.8 oz (93.8 kg)  01/09/22 201 lb (91.2 kg)  01/04/22 201 lb 9.6 oz (91.4 kg)     ASSESSMENT & PLAN:   1. CAD s/p prior CABG, multiple PCIs, last in 01/2022, HLD - recent PCI as above. Had episode of brief CP overnight over the weekend but no recurrence and no progressive anginal symptoms. EKG stable. D/w Dr. Clifton James. We will trial increase in Imdur to 60mg  daily. Otherwise continue ASA, carvedilol, Plavix, Zetia, Imdur, rosuvastatin, and Protonix. She requests refill on her Ambien as our office had been previously sending in refills. PDMP reviewed as required by law, last filled 01/23/22 with regular, non-concerning intervals. As our office does not typically manage controlled substances, we will refill for 1 month and recommend discussing further with PCP. Initially rx was printed by CMA but we shredded this and used the [employer] system to electronically prescribe. She also inquired about potentially having a procedure done on her elbow for a tendon repair sometime in January. Nothing has been set in stone. It's been going on for a year now. Dr. Clifton James has recommended to defer any non-emergent surgical procedures for at least 6 months and he will review pre-op evaluation at their next OV in May. Agree with follow-up with research  team to discuss options for therapy for Lp(a). If she does not feel all her questions are answered in that visit, she will let us know if she wants a formal referral to Dr.  Hilty with lipidology.  2. Groin swelling - reviewed with Dr. Clifton James who also examined patient, felt to be sequelae of recent hematoma, conservative management recommended. No AVF/pseudoaneurysm by duplex. Will f/u CBC to ensure stable.  3. Essential HTN with recent soft BP - BP improved, back on carvedilol and Imdur at doses listed. Will continue to manage in context above.  4. Chronic diastolic CHF - appears euvolemic. Recent LVEDP was normal.  5. H/o MV repair - recent echo was stable. SBE ppx has been reinforced at each visit.    Cardiac Rehabilitation Eligibility Assessment  Would recommend giving her an additional 2 weeks for groin hematoma then she will be ready to start cardiac rehabilitation from a cardiac standpoint.    Disposition: F/u with Dr. Clifton James in 6 months, sooner for any recurrent/progressive symptoms.   Medication Adjustments/Labs and Tests Ordered: Current medicines are reviewed at length with the patient today.  Concerns regarding medicines are outlined above. Medication changes, Labs and Tests ordered today are summarized above and listed in the Patient Instructions accessible in Encounters.   Signed, Laurann Montana, PA-C  02/01/2022 10:33 AM    [facility] Phone: [phone]; Fax: [phone]

## 2022-02-01 ENCOUNTER — Encounter: Payer: Self-pay | Admitting: Physician Assistant

## 2022-02-01 ENCOUNTER — Ambulatory Visit: Payer: Medicare HMO | Attending: Physician Assistant | Admitting: Physician Assistant

## 2022-02-01 VITALS — BP 132/80 | HR 53 | Ht 64.0 in | Wt 206.8 lb

## 2022-02-01 DIAGNOSIS — R1909 Other intra-abdominal and pelvic swelling, mass and lump: Secondary | ICD-10-CM

## 2022-02-01 DIAGNOSIS — E785 Hyperlipidemia, unspecified: Secondary | ICD-10-CM

## 2022-02-01 DIAGNOSIS — I5032 Chronic diastolic (congestive) heart failure: Secondary | ICD-10-CM

## 2022-02-01 DIAGNOSIS — Z9889 Other specified postprocedural states: Secondary | ICD-10-CM

## 2022-02-01 DIAGNOSIS — I10 Essential (primary) hypertension: Secondary | ICD-10-CM | POA: Diagnosis not present

## 2022-02-01 DIAGNOSIS — I2510 Atherosclerotic heart disease of native coronary artery without angina pectoris: Secondary | ICD-10-CM | POA: Diagnosis not present

## 2022-02-01 LAB — CBC
Hematocrit: 38.9 % (ref 34.0–46.6)
Hemoglobin: 13 g/dL (ref 11.1–15.9)
MCH: 31 pg (ref 26.6–33.0)
MCHC: 33.4 g/dL (ref 31.5–35.7)
MCV: 93 fL (ref 79–97)
Platelets: 359 10*3/uL (ref 150–450)
RBC: 4.19 x10E6/uL (ref 3.77–5.28)
RDW: 14.6 % (ref 11.7–15.4)
WBC: 9.4 10*3/uL (ref 3.4–10.8)

## 2022-02-01 MED ORDER — ISOSORBIDE MONONITRATE ER 60 MG PO TB24: 60.0000 mg | ORAL_TABLET | Freq: Every day | ORAL | 1 refills | Status: DC

## 2022-02-01 MED ORDER — ZOLPIDEM TARTRATE 5 MG PO TABS: ORAL_TABLET | ORAL | 0 refills | Status: DC

## 2022-02-01 NOTE — Addendum Note (Signed)
Addended by: Ulice Brilliant T on: 02/01/2022 10:47 AM   Modules accepted: Orders

## 2022-02-01 NOTE — Patient Instructions (Addendum)
Medication Instructions:  START Ambien '5mg'$  Take 1 tablet daily at bed time as needed INCREASE Imdur to '60mg'$  Take 1 tablet daily *If you need a refill on your cardiac medications before your next appointment, please call your pharmacy*   Lab Work: [facility] If you have labs (blood work) drawn today and your tests are completely normal, you will receive your results only by: [place] (if you have MyChart) OR A paper copy in the mail If you have any lab test that is abnormal or we need to change your treatment, we will call you to review the results.   Testing/Procedures: NONE ORDERED   Follow-Up: At [facility], you and your health needs are our priority.  As part of our continuing mission to provide you with exceptional heart care, we have created designated Provider Care Teams.  These Care Teams include your primary Cardiologist (physician) and Advanced Practice Providers (APPs -  Physician Assistants and Nurse Practitioners) who all work together to provide you with the care you need, when you need it.  We recommend signing up for the patient portal called "MyChart".  Sign up information is provided on this After Visit Summary.  MyChart is used to connect with patients for Virtual Visits (Telemedicine).  Patients are able to view lab/test results, encounter notes, upcoming appointments, etc.  Non-urgent messages can be sent to your provider as well.   To learn more about what you can do with MyChart, go to [email or web address].    Your next appointment:   6 month(s)  The format for your next appointment:   In Person  Provider:   Lauree Chandler, MD     Other Instructions Endocarditis Information  You may be at risk for developing endocarditis since you have an artificial heart valve or a repaired heart valve. Endocarditis is an infection of the lining of the heart or heart valves. Certain surgical and dental procedures may put you at risk, such as teeth  cleaning or other dental procedures or other medical procedures. Notify our office or your dentist before having any dental work or invasive/surgical procedures. You will need to take antibiotics before certain procedures. To prevent endocarditis, maintain good oral health. Seek prompt medical attention for any mouth/gum, skin or urinary tract infections.  Important Information About Sugar

## 2022-02-08 ENCOUNTER — Encounter: Payer: Medicare HMO | Admitting: *Deleted

## 2022-02-08 VITALS — BP 146/82 | HR 54 | Ht 64.0 in | Wt 205.4 lb

## 2022-02-08 DIAGNOSIS — Z006 Encounter for examination for normal comparison and control in clinical research program: Secondary | ICD-10-CM

## 2022-02-08 NOTE — Research (Cosign Needed Addendum)
Elizabeth Park Informed Consent   Subject Name: Elizabeth Park  Subject met inclusion and exclusion criteria.  The informed consent form, study requirements and expectations were reviewed with the subject and questions and concerns were addressed prior to the signing of the consent form.  The subject verbalized understanding of the trial requirements.  The subject agreed to participate in the [facility] trial and signed the informed consent at 09:22am on 02/08/2022.  The informed consent was obtained prior to performance of any protocol-specific procedures for the subject.  A copy of the signed informed consent was given to the subject and a copy was placed in the subject's medical record.   Elizabeth Park  [employer] Consent Version 5 Protocol Version 1 amendment 3  [place] [employer] [phone]      SITE [phone] SUBJECT LD:[id number]      DATE: 02/08/2022          _0  FEMALE                            _1  FEMALE AGE: ETHINICITY:   _2  HISPANIC/LATINO      _3  NON- HISPANIC/LATINO RACE:           _4   WHITE             _5  BLACK/AFRICAN AMERICAN                         _6   ASIAN              _7  AMERICAN INDIAN/[place] NATIVE                         _8   NATIVE [place]/OTHER PACIFIC ISLANDER                         _9   OTHER  FUTURE RESEARCH _10  USE OF SAMPLES FOR FUTURE RESEARCH _11  PHARMACOGENTIC (GENETIC) [place] Screening Visit [phone] SUBJECT ID: [id number]      DATE:   02/08/2022                                The following was completed during visit: _12  CONSENT SIGNED _13  INCLUSION/EXCLUSION MET _14  MEASUREMENTS TAKEN HEIGHT:  5'4"                 WEIGHT:205.4lb WAIST CIRMCUMFERENCE:   46in            HIP CIRCUMFERENCE:48in B/P:  146/82                          HR:54 _15  SERUM PREGNANCY TEST/FSH-NA _16  FASTING GLUCOSE/HbA1c _17  FASTING LIPID PANELY _18  LPa _19  CHEMISTRY/HEMATOLOGY _20  MEDICATIONS REVIEWED _21  DEMOGRAPHICS  Patient was seen today in  the research clinic for the Screening Visit for [name]. Consent reviewed with patient, all questions answered. Patient signed consent and is in agreement to proceed with the screening visit for the trial. All concomitant medications reviewed and no changes noted at this time. Denies any recent adverse events or hospitalizations. All procedures completed per protocol and patient tolerated well without any complaints. Explained to patient that I will call once all lab work has been completed to see if they still qualify before making an appointment for randomization. Patient verbalized understanding.   Current Outpatient Medications:  aspirin 81 MG tablet, Take 81 mg by mouth daily. , Disp: , Rfl:    carvedilol (COREG) 3.125 MG tablet, TAKE 1 TABLET BY MOUTH TWICE A DAY, Disp: 180 tablet, Rfl: 3   clopidogrel (PLAVIX) 75 MG tablet, TAKE 1 TABLET BY MOUTH EVERY DAY WITH BREAKFAST, Disp: 90 tablet, Rfl: 3   ezetimibe (ZETIA) 10 MG tablet, Take 1 tablet (10 mg total) by mouth daily., Disp: 90 tablet, Rfl: 3   gabapentin (NEURONTIN) 300 MG capsule, Take 300 mg by mouth 3 (three) times daily. , Disp: , Rfl:    insulin aspart (NOVOLOG) 100 UNIT/ML injection, Inject 4-10 Units into the skin See admin instructions. Per sliding scale Under 150 no units 150-199 take 8 units 200-249 take 10 units 250-299 take 12 units, Disp: , Rfl:    isosorbide mononitrate (IMDUR) 60 MG 24 hr tablet, Take 1 tablet (60 mg total) by mouth daily., Disp: 90 tablet, Rfl: 1   metFORMIN (GLUCOPHAGE-XR) 500 MG 24 hr tablet, Take 500 mg by mouth in the morning and at bedtime. , Disp: , Rfl:    MOUNJARO 5 MG/0.5ML Pen, Inject 5 mg into the skin once a week., Disp: , Rfl:    nitroGLYCERIN (NITROSTAT) 0.4 MG SL tablet, PLACE 1 TABLET UNDER THE TONGUE EVERY 5 MINUTES AS NEEDED FOR CHEST PAIN (UP TO 3 DOSES/15MIN), Disp: 75 tablet, Rfl: 2   oxyCODONE-acetaminophen (PERCOCET) 10-325 MG per tablet, Take 1 tablet by mouth 5 (five) times daily.  , Disp: , Rfl:    pantoprazole (PROTONIX) 40 MG tablet, Take 1 tablet (40 mg total) by mouth daily., Disp: 30 tablet, Rfl: 11   rosuvastatin (CRESTOR) 40 MG tablet, TAKE 1 TABLET (40 MG TOTAL) EVERY EVENING BY MOUTH., Disp: 90 tablet, Rfl: 3   sertraline (ZOLOFT) 25 MG tablet, Take 25 mg by mouth daily., Disp: , Rfl:    TRESIBA FLEXTOUCH 100 UNIT/ML SOPN FlexTouch Pen, Inject 60 Units as directed at bedtime. , Disp: , Rfl: 4   zolpidem (AMBIEN) 5 MG tablet, TAKE 1 TABLET BY MOUTH EVERY DAY AT BEDTIME AS NEEDED FOR SLEEP, Disp: 30 tablet, Rfl: 0   Continuous Blood Gluc Sensor (FREESTYLE LIBRE 2 SENSOR) MISC, Apply 1 Device topically every 14 (fourteen) days. Wears continuously changes every 14 days, Disp: , Rfl:

## 2022-02-13 NOTE — Research (Signed)
[name] screening labs:              Are there any labs that are clinically significant?  Yes '[]'$  OR No'[]'$ 

## 2022-02-21 ENCOUNTER — Ambulatory Visit: Payer: Medicare HMO | Attending: Physician Assistant

## 2022-02-21 DIAGNOSIS — I25810 Atherosclerosis of coronary artery bypass graft(s) without angina pectoris: Secondary | ICD-10-CM

## 2022-02-21 DIAGNOSIS — E7800 Pure hypercholesterolemia, unspecified: Secondary | ICD-10-CM

## 2022-02-21 LAB — LIPID PANEL
Chol/HDL Ratio: 2.9 ratio (ref 0.0–4.4)
Cholesterol, Total: 134 mg/dL (ref 100–199)
HDL: 46 mg/dL (ref >39)
LDL Chol Calc (NIH): 66 mg/dL (ref 0–99)
Triglycerides: 120 mg/dL (ref 0–149)
VLDL Cholesterol Cal: 22 mg/dL (ref 5–40)

## 2022-02-22 NOTE — Research (Addendum)
Screening [name] Lp(a) results:

## 2022-03-01 ENCOUNTER — Telehealth: Payer: Self-pay

## 2022-03-01 NOTE — Telephone Encounter (Signed)
Called pt- advised her that she qualified for the [name] study with a Lp(a) of 609. Explained next steps and randomization visit. Pt is agreeable to scheduling. I have scheduled her for 03/09/2022 at 9:30am and she is aware to be fasting and of the parking code.

## 2022-03-09 ENCOUNTER — Encounter: Payer: Medicare HMO | Admitting: *Deleted

## 2022-03-09 VITALS — BP 148/60 | HR 54 | Temp 98.0°F | Ht 64.0 in | Wt 206.2 lb

## 2022-03-09 DIAGNOSIS — Z006 Encounter for examination for normal comparison and control in clinical research program: Secondary | ICD-10-CM

## 2022-03-09 MED ORDER — STUDY - OCEAN(A) - OLPASIRAN (AMG 890) 142 MG/ML OR PLACEBO SQ INJECTION (PI-HILTY)
142.0000 mg | PREFILLED_SYRINGE | Freq: Once | SUBCUTANEOUS | Status: AC
  Administered 2022-03-09: 142 mg via SUBCUTANEOUS
  Filled 2022-03-09: qty 1

## 2022-03-09 NOTE — Progress Notes (Signed)
50 year female well known to me.  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  She has had subsequent cardiac events and underwent stenting one month ago.   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 Widely patent LIMA-LAD. Chronic total occlusions of SVG-medial ramus branch and SVG-lateral ramus branch. 95% proximal graft stenosis involving SVG-PDA. Patent stented segment of LMCA->ramus with 20-30% in-stent restenosis. Patent stent in ostial/proximal SVG-PDA with 20% ISR. Patent stent in PLAV Normal LVEF (50-55%) and LVEDP (11 mmHg). Small left radial artery necessitating use of 22F slender [place]ath and not suitable for PCI. Successful PCI to proximal/mid SVG-PDA using Synergy 3.0 x 24 mm drug-eluting stent that overlaps ostial graft stent with 0% residual stenosis and TIMI-3 flow. Right groin hematoma following MynxGrip deployment, controlled with manual compression.    She is doing ok.  Screening Lpa was >400 and repeat 609.  She qualifies for [place] a.  I know her well from my previous care, and reviewed the clinical significance with her in detail.  I explained the trial, the IP, risks, alternatives, and suggested her sons be screened for future potential evaluation.  All questions answered.  She would like to proceed with the trial.   Inclusions and Exclusion reviewed, and she is eligible for participation.   Exam  Delightful female in NAD Bp                  P          R         T Lungs clear Sternal wound healed.  Cor reg without gallops or murmurs. Abd - No obvious masses.   Ext   no edema.  One plus distal pulses.  Neuro non focal.   ECG SB.  Low voltage QRS.  Possible old inferior MI.  Cannot eclude old anterior MI vs. Delayed R wave progression associated with  lead placement and habitus.    Impression  Severe CAD Elevated LPa.  Patient consents to enrollment in the [name] trial  Elizabeth Park. [name], MD, [place] Director, [facility]

## 2022-03-09 NOTE — Research (Addendum)
AMGEN [id number] SITE #: [id number] SUBJECT ID#:                     ELIGIBLITY CRITERIA WORKSHEET                             INCLUSION CRITERIA PROVIDED INFORMED CONSENT _0   AGE 51 TO ? 45 _1   Lp(a) ? 200 nmol/L during screening by central lab _2   MI (presumed type 1 event due to plaque rupture       AND/OR _3   PCI stenting AND one of the following: _4   AGE ? 22 _5   Residual coronary artery stenosis > 50% in any major vessel _6   Multivessel PCI _7   Symptomatic peripheral arterial disease with either (a) intermittent claudication with ankle-brachial index (ABI) < 0.85, or (b) peripheral arterial revascularization or amputation due to atherosclerotic disease _8   Ischemic stroke (presumed atherosclerotic in origin) _9   DM    HbA1c:  ____7.5_____________ _10                        EXCLUSION CRITERIA           N/A                          _11  Severe renal function eGFR < 46m/min/1.73 m _12   Active liver disease, known hepatitis, or any hepatic dysfunction, defined as AST or ALT > 3 x upper limit of normal, or total bilirubin > 2 x ULN during screening _13   History of hemorrhagic stroke _14   History of major bleeding disorder _15   Major cardiovascular event within 4 weeks prior to Lp(a) screening or during screening _16   Planned cardiac or arterial revascularization _17   Malignancy within the last 5 years prior to Study day 1 (except non-melanoma skin cancers, cervical and breast ductal carcinoma in situ, or stage 1 prostate) _18   Severe Heart Failure Class IV, and/or left EF < 30% _19   Uncontrolled or recurrent Ventricular tachycardia in the last 3 months _20   A fib/flutter not on therapeutic anticoagulation or having placement of left atrial appendage closure device _21   Uncontrolled HTN at screening, systolic > 1347QQVZor diastolic > 1563OVFI<<EPPIRJJOACZYSAYT>_0<\/ZSWFUXNATFTDDUKG>_25  Fasting triglycerides > 4033mdL during screening _23   HbA1c > 10% _24   Know major active infection _25   Current or planned lipoprotein apheresis or < 3 months  since last apheresis _26   Previously received RNA therapy specifically targeting Lp(a) _27   OC[place]oper Park    SITE [id number] ID:   [id number]       DATE:   03/09/2022       _28  FEMALE                            _29  FEMALE AGE:27 ETHINICITY:   _30  HISPANIC/LATINO      _31  NON- HISPANIC/LATINO RACE:           _32   WHITE             _33  BLACK/AFRICAN AMERICAN                         _34   ASIAN              _35  AMERICAN INDIAN/[place] NATIVE                         _36   NATIVE [place]/OTHER PACIFIC ISLANDER                         []   OTHER  FUTURE RESEARCH [x]  USE OF SAMPLES FOR FUTURE RESEARCH [x]  PHARMACOGENTIC (GENETIC) RESEARCH  Elizabeth Park Subject [phone] IP Box #:[id number] IP lot#: [id number] Time Given:10:01 Park site: Right arm   Patient seen in clinic today for week 1 visit of [name] trial. Patient denies any adverse events since last visit. Reviewed medications with patient and no changes noted at this visit. All procedures and  Lab work completed per protocol and patient tolerated well without complaints. IP injection was given at 10:01 in the Right arm without any complications noted.  Next visit Week 4 scheduled for 04/13/2022. Patient was monitored for 30 minutes after IP injection.    Current Outpatient Medications:    aspirin 81 MG tablet, Take 81 mg by mouth daily. , Disp: , Rfl:    carvedilol (COREG) 3.125 MG tablet, TAKE 1 TABLET BY MOUTH TWICE A DAY, Disp: 180 tablet, Rfl: 3   clopidogrel (PLAVIX) 75 MG tablet, TAKE 1 TABLET BY MOUTH EVERY DAY WITH BREAKFAST, Disp: 90 tablet, Rfl: 3   ezetimibe (ZETIA) 10 MG tablet, Take 1 tablet (10 mg total) by mouth daily., Disp: 90 tablet, Rfl: 3   gabapentin (NEURONTIN) 300 MG capsule, Take 300 mg by mouth 3 (three) times daily. , Disp: , Rfl:    insulin aspart (NOVOLOG) 100 UNIT/ML injection, Inject 4-10 Units into the skin See admin instructions. Per sliding scale Under 150  no units 150-199 take 8 units 200-249 take 10 units 250-299 take 12 units, Disp: , Rfl:    isosorbide mononitrate (IMDUR) 60 MG 24 hr tablet, Take 1 tablet (60 mg total) by mouth daily., Disp: 90 tablet, Rfl: 1   metFORMIN (GLUCOPHAGE-XR) 500 MG 24 hr tablet, Take 500 mg by mouth in the morning and at bedtime. , Disp: , Rfl:    MOUNJARO 5 MG/0.5ML Pen, Inject 5 mg into the skin once a week., Disp: , Rfl:    nitroGLYCERIN (NITROSTAT) 0.4 MG SL tablet, PLACE 1 TABLET UNDER THE TONGUE EVERY 5 MINUTES AS NEEDED FOR CHEST PAIN (UP TO 3 DOSES/15MIN), Disp: 75 tablet, Rfl: 2   oxyCODONE-acetaminophen (PERCOCET) 10-325 MG per tablet, Take 1 tablet by mouth 5 (five) times daily. , Disp: , Rfl:    pantoprazole (PROTONIX) 40 MG tablet, Take 1 tablet (40 mg total) by mouth daily., Disp: 30 tablet, Rfl: 11   rosuvastatin (CRESTOR) 40 MG tablet, TAKE 1 TABLET (40 MG TOTAL) EVERY EVENING BY MOUTH., Disp: 90 tablet, Rfl: 3   sertraline (ZOLOFT) 25 MG tablet, Take 25 mg by mouth daily., Disp: , Rfl:    TRESIBA FLEXTOUCH 100 UNIT/ML SOPN FlexTouch Pen, Inject 60 Units as directed at bedtime. , Disp: , Rfl: 4   zolpidem (AMBIEN) 5 MG tablet, TAKE 1 TABLET BY MOUTH EVERY DAY AT BEDTIME AS NEEDED FOR SLEEP, Disp: 30 tablet, Rfl: 0   Continuous Blood Gluc Sensor (FREESTYLE LIBRE 2 SENSOR) MISC, Apply 1 Device topically every 14 (fourteen) days. Wears continuously changes every 14 days, Disp: , Rfl:  .

## 2022-03-15 NOTE — Research (Cosign Needed)
[name] randomization day 1 lab results:             Are there any labs that are clinically significant?  Yes '[x]'$  OR No'[]'$   CK is elevated - does the patient have any muscle pain symptoms? Would monitor for stability on therapy.  Pixie Casino, MD, [facility], [place] Director of the Advanced Lipid Disorders &  Cardiovascular Risk Reduction Clinic Diplomate of the American Board of Clinical Lipidology Attending Cardiologist  Direct Dial: [phone]  Fax: [phone]  Website:  [email or web address]

## 2022-03-20 ENCOUNTER — Telehealth (HOSPITAL_COMMUNITY): Payer: Self-pay

## 2022-03-20 NOTE — Telephone Encounter (Signed)
Pt is not interested in the cardiac rehab program. Closed referral 

## 2022-04-09 ENCOUNTER — Other Ambulatory Visit: Payer: Self-pay | Admitting: Physician Assistant

## 2022-04-13 ENCOUNTER — Encounter: Payer: Medicare HMO | Admitting: *Deleted

## 2022-04-13 DIAGNOSIS — Z006 Encounter for examination for normal comparison and control in clinical research program: Secondary | ICD-10-CM

## 2022-04-13 NOTE — Research (Signed)
Elizabeth Park) Week 4 13-Apr-2022  Ms Woodmansee is here today for Week 4 visit of [name]. She reports no A/E's or SAE's, no visits to the ED or Urgent care since last seen. Blood was drawn at 0929. Tol well. Next appointment scheduled for March 5th at 0930.

## 2022-04-17 NOTE — Research (Addendum)
[name] week 4 lab results:           Are there any labs that are clinically significant?  Yes '[]'$  OR No'[x]'$   Pixie Casino, MD, FACC, [place] Director of the Advanced Lipid Disorders &  Cardiovascular Risk Reduction Clinic Diplomate of the American Board of Clinical Lipidology Attending Cardiologist  Direct Dial: [phone]  Fax: [phone]  Website:  [email or web address]

## 2022-06-06 ENCOUNTER — Encounter: Payer: Medicare HMO | Admitting: *Deleted

## 2022-06-06 VITALS — BP 120/61 | HR 62 | Temp 97.9°F

## 2022-06-06 DIAGNOSIS — Z006 Encounter for examination for normal comparison and control in clinical research program: Secondary | ICD-10-CM

## 2022-06-06 MED ORDER — STUDY - OCEAN(A) - OLPASIRAN (AMG 890) 142 MG/ML OR PLACEBO SQ INJECTION (PI-HILTY)
142.0000 mg | PREFILLED_SYRINGE | Freq: Once | SUBCUTANEOUS | Status: AC
  Administered 2022-06-06: 142 mg via SUBCUTANEOUS
  Filled 2022-06-06: qty 1

## 2022-06-06 NOTE — Research (Addendum)
[name] week 12 visit:   Patient seen in clinic today for week  12 visit of [name] trial. Patient denies any adverse events since last visit. Reviewed medications with patient and no changes noted at this visit. All procedures and  Lab work completed per protocol and patient tolerated well without complaints. IP injection was given at 09:58 in the R arm without any complications noted.  Next visit Week 24 scheduled for 09/06/2022.  Patient was monitored for 30 minutes after IP injection.  PK drawn at 11:45.                    [name] IP ADMINISTRATION Subject FA#[id number] IP Box [id number] IP NGE#:[id number] Time Given:09:58 Administration site:R arm   Current Outpatient Medications:    aspirin 81 MG tablet, Take 81 mg by mouth daily. , Disp: , Rfl:    carvedilol (COREG) 3.125 MG tablet, TAKE 1 TABLET BY MOUTH TWICE A DAY, Disp: 180 tablet, Rfl: 3   clopidogrel (PLAVIX) 75 MG tablet, TAKE 1 TABLET BY MOUTH EVERY DAY WITH BREAKFAST, Disp: 90 tablet, Rfl: 3   ezetimibe (ZETIA) 10 MG tablet, Take 1 tablet (10 mg total) by mouth daily., Disp: 90 tablet, Rfl: 3   gabapentin (NEURONTIN) 300 MG capsule, Take 300 mg by mouth 3 (three) times daily. , Disp: , Rfl:    insulin aspart (NOVOLOG) 100 UNIT/ML injection, Inject 4-10 Units into the skin See admin instructions. Per sliding scale Under 150 no units 150-199 take 8 units 200-249 take 10 units 250-299 take 12 units, Disp: , Rfl:    isosorbide mononitrate (IMDUR) 60 MG 24 hr tablet, Take 1 tablet (60 mg total) by mouth daily., Disp: 90 tablet, Rfl: 1   metFORMIN (GLUCOPHAGE-XR) 500 MG 24 hr tablet, Take 500 mg by mouth in the morning and at bedtime. , Disp: , Rfl:    MOUNJARO 5 MG/0.5ML Pen, Inject 5 mg into the skin once a week., Disp: , Rfl:    nitroGLYCERIN (NITROSTAT) 0.4 MG SL tablet, PLACE 1 TABLET UNDER THE TONGUE EVERY 5 MINUTES AS NEEDED FOR CHEST PAIN (UP TO 3 DOSES/15MIN), Disp: 75 tablet, Rfl: 2   oxyCODONE-acetaminophen (PERCOCET)  10-325 MG per tablet, Take 1 tablet by mouth 4 (four) times daily., Disp: , Rfl:    pantoprazole (PROTONIX) 40 MG tablet, Take 1 tablet (40 mg total) by mouth daily., Disp: 30 tablet, Rfl: 11   rosuvastatin (CRESTOR) 40 MG tablet, TAKE 1 TABLET (40 MG TOTAL) EVERY EVENING BY MOUTH., Disp: 90 tablet, Rfl: 3   sertraline (ZOLOFT) 25 MG tablet, Take 25 mg by mouth daily., Disp: , Rfl:    TRESIBA FLEXTOUCH 100 UNIT/ML SOPN FlexTouch Pen, Inject 60 Units as directed at bedtime. , Disp: , Rfl: 4   zolpidem (AMBIEN) 5 MG tablet, TAKE 1 TABLET BY MOUTH EVERY DAY AT BEDTIME AS NEEDED FOR SLEEP, Disp: 30 tablet, Rfl: 0   Continuous Blood Gluc Sensor (FREESTYLE LIBRE 2 SENSOR) MISC, Apply 1 Device topically every 14 (fourteen) days. Wears continuously changes every 14 days, Disp: , Rfl:

## 2022-06-12 NOTE — Research (Signed)
[name] week 12 lab results:

## 2022-07-29 ENCOUNTER — Other Ambulatory Visit: Payer: Self-pay | Admitting: Physician Assistant

## 2022-08-07 ENCOUNTER — Ambulatory Visit: Payer: Medicare HMO | Attending: Cardiovascular Disease | Admitting: Cardiovascular Disease

## 2022-08-07 ENCOUNTER — Encounter: Payer: Self-pay | Admitting: Cardiovascular Disease

## 2022-08-07 VITALS — BP 102/64 | HR 60 | Resp 16 | Wt 207.0 lb

## 2022-08-07 DIAGNOSIS — Z01810 Encounter for preprocedural cardiovascular examination: Secondary | ICD-10-CM

## 2022-08-07 DIAGNOSIS — I5032 Chronic diastolic (congestive) heart failure: Secondary | ICD-10-CM

## 2022-08-07 DIAGNOSIS — E785 Hyperlipidemia, unspecified: Secondary | ICD-10-CM

## 2022-08-07 DIAGNOSIS — I25118 Atherosclerotic heart disease of native coronary artery with other forms of angina pectoris: Secondary | ICD-10-CM

## 2022-08-07 DIAGNOSIS — Z9889 Other specified postprocedural states: Secondary | ICD-10-CM | POA: Diagnosis not present

## 2022-08-07 DIAGNOSIS — I10 Essential (primary) hypertension: Secondary | ICD-10-CM

## 2022-08-07 NOTE — Progress Notes (Signed)
Chief Complaint  Patient presents with   Follow-up    CAD    History of Present Illness: 52 yo female with history of CAD s/p 4V CABG 2011, mitral regurgitation s/p repair, chronic diastolic CHF, DM, HTN and hyperlipidemia who is here today for cardiac follow up. Cardiac cath  In December 2010 showed severe proximal LAD stenosis, severe mid intermediate disease with occluded mid RCA and anomalous Circumflex with severe disease. She also had severe mitral regurgitation. She underwent a 4V CABG  (LIMA to LAD, SVG to subbranch of ramus intermediate, SVG to lateral subbranch of ramus intermediate, SVG to distal RCA) and mitral valve repair January 2011. Echo September 2016 with normal LV systolic function, stable mitral valve repair. She had chest pain in June 2018 and her nuclear stress test showed ischemia. Cardiac cath on 09/22/16 showed progression of her CAD. LV function was normal. I placed a drug eluting stent in the vein graft to the PDA, a drug eluting stent in the native Ramus intermediate branch and a drug eluting stent in the protected left main. Repeat cardiac cath 02/07/17 and found to have continued occlusion of all three native vessels with patent LIMA to LAD, occluded SVG to ramus x 2, patent SVG to PDA but severe restenosis in the ramus intermediate stent. This was treated with another drug eluting stent. Her BP was soft in the hospital so Norvasc was stopped.  Coreg dose lowered at office visit in February 2019 due to fatigue. Echo August 2020 with LVEF=60-65%, mitral valve repair intact with mild stenosis. She had chest pain in April 2021. Nuclear stress test April 2021  without ischemia. Imdur was added and her chest pain resolved. She had recurrent chest pain in October 2021. Cardiac cath 01/30/20 with 2/4 patent bypass grafts, chronic occlusion small Circumflex artery, chronic occlusion proximal RCA, patent left main stent. Patent ramus intermediate stent with severe distal stent stenosis  which was treated with a drug eluting stent. The LIMA to the LAD was patent. The vein graft to the PDA is patent but severe stenosis posterolateral artery. A drug eluting stent was placed in the native posterolateral artery through the vein graft. Echo August 2023 with normal LV function. Mild mitral stenosis s/p repair. She was seen in our office October 2023 with chest pain. Cardiac cath October 2023 with severe disease in the vein graft to PDA, treated with a drug eluting stent.   She is here today for follow up. The patient denies any chest pain, dyspnea, palpitations, lower extremity edema, orthopnea, PND, dizziness, near syncope or syncope. She is feeling well. Planning for elbow surgery in 2 weeks. She is going to see her new grandson in [place] tomorrow.    Primary Care Physician: Ronnald Collum  Past Medical History:  Diagnosis Date   Chronic diastolic CHF (congestive heart failure) (HCC)    Coronary artery disease    a. 4v CABG  (LIMA to LAD, SVG to subbranch of ramus intermediate, SVG to lateral subbranch of ramus intermediate, SVG to distal RCA) and mitral valve repair January 2011. b. Abnormal nuc 09/2016 s/p  drug eluting stent to the vein graft to the PDA, a drug eluting stent in the native Ramus intermediate branch and a drug eluting stent in the protected left main. c. DES to ramus intermediate 02/2017.    Diabetes mellitus    H/O emotional problems    Headache, migraine    High cholesterol    History of blood transfusion  History of nuclear stress test    Myoview 07/2019: EF 60, normal perfusion, low risk.   Hypertension    MI, old    MRSA (methicillin resistant Staphylococcus aureus)    MVP (mitral valve prolapse)    Neuromuscular disorder (HCC)    Neuropathy    S/P mitral valve repair    Sebaceous cyst     Past Surgical History:  Procedure Laterality Date   ABDOMINAL HYSTERECTOMY     CARDIAC CATHETERIZATION  09/22/2016   CORONARY ARTERY BYPASS GRAFT  with  valve repair   CORONARY STENT INTERVENTION N/A 09/22/2016   Procedure: Coronary Stent Intervention;  Surgeon: Kathleene Hazel, MD;  Location: [facility] INVASIVE CV LAB;  Service: Cardiovascular;  Laterality: N/A;   CORONARY STENT INTERVENTION N/A 02/07/2017   Procedure: CORONARY STENT INTERVENTION;  Surgeon: Swaziland, Peter M, MD;  Location: [facility]VASIVE CV LAB;  Service: Cardiovascular;  Laterality: N/A;   CORONARY STENT INTERVENTION N/A 01/30/2020   Procedure: CORONARY STENT INTERVENTION;  Surgeon: Kathleene Hazel, MD;  Location: [facility] INVASIVE CV LAB;  Service: Cardiovascular;  Laterality: N/A;   CORONARY STENT INTERVENTION N/A 01/09/2022   Procedure: CORONARY STENT INTERVENTION;  Surgeon: Yvonne Kendall, MD;  Location: [facility] INVASIVE CV LAB;  Service: Cardiovascular;  Laterality: N/A;   CORONARY STENT PLACEMENT  09/22/2016   LEFT HEART CATH AND CORS/GRAFTS ANGIOGRAPHY N/A 09/22/2016   Procedure: Left Heart Cath and Cors/Grafts Angiography;  Surgeon: Kathleene Hazel, MD;  Location: [facility] INVASIVE CV LAB;  Service: Cardiovascular;  Laterality: N/A;   LEFT HEART CATH AND CORS/GRAFTS ANGIOGRAPHY N/A 02/07/2017   Procedure: LEFT HEART CATH AND CORS/GRAFTS ANGIOGRAPHY;  Surgeon: Swaziland, Peter M, MD;  Location: [facility] INVASIVE CV LAB;  Service: Cardiovascular;  Laterality: N/A;   LEFT HEART CATH AND CORS/GRAFTS ANGIOGRAPHY N/A 01/30/2020   Procedure: LEFT HEART CATH AND CORS/GRAFTS ANGIOGRAPHY;  Surgeon: Kathleene Hazel, MD;  Location: [facility] INVASIVE CV LAB;  Service: Cardiovascular;  Laterality: N/A;   LEFT HEART CATH AND CORS/GRAFTS ANGIOGRAPHY N/A 01/09/2022   Procedure: LEFT HEART CATH AND CORS/GRAFTS ANGIOGRAPHY;  Surgeon: Yvonne Kendall, MD;  Location: [facility] INVASIVE CV LAB;  Service: Cardiovascular;  Laterality: N/A;   SKIN GRAFT      Current Outpatient Medications  Medication Sig Dispense Refill   aspirin 81 MG tablet Take 81 mg by mouth daily.      carvedilol (COREG) 3.125 MG tablet  TAKE 1 TABLET BY MOUTH TWICE A DAY 180 tablet 3   clopidogrel (PLAVIX) 75 MG tablet TAKE 1 TABLET BY MOUTH EVERY DAY WITH BREAKFAST 90 tablet 3   Continuous Blood Gluc Sensor (FREESTYLE LIBRE 2 SENSOR) MISC Apply 1 Device topically every 14 (fourteen) days. Wears continuously changes every 14 days     ezetimibe (ZETIA) 10 MG tablet Take 1 tablet (10 mg total) by mouth daily. 90 tablet 3   gabapentin (NEURONTIN) 300 MG capsule Take 300 mg by mouth 3 (three) times daily.      insulin aspart (NOVOLOG) 100 UNIT/ML injection Inject 4-10 Units into the skin See admin instructions. Per sliding scale Under 150 no units 150-199 take 8 units 200-249 take 10 units 250-299 take 12 units     isosorbide mononitrate (IMDUR) 60 MG 24 hr tablet TAKE 1 TABLET BY MOUTH EVERY DAY 90 tablet 1   metFORMIN (GLUCOPHAGE-XR) 500 MG 24 hr tablet Take 500 mg by mouth in the morning and at bedtime.      MOUNJARO 5 MG/0.5ML Pen Inject 5 mg into the skin  once a week.     nitroGLYCERIN (NITROSTAT) 0.4 MG SL tablet PLACE 1 TABLET UNDER THE TONGUE EVERY 5 MINUTES AS NEEDED FOR CHEST PAIN (UP TO 3 DOSES/15MIN) 75 tablet 2   oxyCODONE-acetaminophen (PERCOCET) 10-325 MG per tablet Take 1 tablet by mouth 4 (four) times daily.     pantoprazole (PROTONIX) 40 MG tablet Take 1 tablet (40 mg total) by mouth daily. 30 tablet 11   rosuvastatin (CRESTOR) 40 MG tablet TAKE 1 TABLET (40 MG TOTAL) EVERY EVENING BY MOUTH. 90 tablet 3   sertraline (ZOLOFT) 25 MG tablet Take 25 mg by mouth daily.     TRESIBA FLEXTOUCH 100 UNIT/ML SOPN FlexTouch Pen Inject 60 Units as directed at bedtime.   4   zolpidem (AMBIEN) 5 MG tablet TAKE 1 TABLET BY MOUTH EVERY DAY AT BEDTIME AS NEEDED FOR SLEEP 30 tablet 0   No current facility-administered medications for this visit.    Allergies  Allergen Reactions   Metformin Diarrhea and Nausea And Vomiting    Immediate release tablets (not metformin ER, pt currently taking)   Jardiance [Empagliflozin]      Yeast infections   Wound Dressing Adhesive Other (See Comments)    Burns skin/ bandages    Social History   Socioeconomic History   Marital status: Divorced    Spouse name: Not on file   Number of children: 2   Years of education: Not on file   Highest education level: Not on file  Occupational History   Occupation: Disability  Tobacco Use   Smoking status: Former    Packs/day: 1.00    Years: 28.00    Additional pack years: 0.00    Total pack years: 28.00    Types: Cigarettes    Quit date: 03/31/2009    Years since quitting: 13.3   Smokeless tobacco: Never  Vaping Use   Vaping Use: Never used  Substance and Sexual Activity   Alcohol use: No   Drug use: No   Sexual activity: Yes    Birth control/protection: Surgical    Comment: partial hysterectomy  Other Topics Concern   Not on file  Social History Narrative   Not on file   Social Determinants of Health   Financial Resource Strain: Not on file  Food Insecurity: Not on file  Transportation Needs: No Transportation Needs (04/10/2017)   PRAPARE - Administrator, Civil Service (Medical): No    Lack of Transportation (Non-Medical): No  Physical Activity: Inactive (04/10/2017)   Exercise Vital Sign    Days of Exercise per Week: 0 days    Minutes of Exercise per Session: 0 min  Stress: No Stress Concern Present (04/10/2017)   [employer] of Occupational Health - Occupational Stress Questionnaire    Feeling of Stress : Only a little  Social Connections: Not on file  Intimate Partner Violence: Not on file    Family History  Problem Relation Age of Onset   Heart disease Father    Asthma Father    Hypertension Father    Diabetes Father    Arthritis Father    COPD Father    Heart disease Mother    Hypertension Mother    Diabetes Mother    Migraines Mother    Stroke Mother    Emphysema Maternal Grandmother    Hypertension Brother    Diabetes Brother    Breast cancer Neg Hx     Review of  Systems:  As stated in the HPI and otherwise  negative.   BP 102/64 (BP Location: Right Arm, Patient Position: Sitting, Cuff Size: Normal)   Pulse 60   Resp 16   Wt 93.9 kg   BMI 35.53 kg/m   Physical Examination: General: Well developed, well nourished, NAD  HEENT: OP clear, mucus membranes moist  SKIN: warm, dry. No rashes. Neuro: No focal deficits  Musculoskeletal: Muscle strength 5/5 all ext  Psychiatric: Mood and affect normal  Neck: No JVD, no carotid bruits, no thyromegaly, no lymphadenopathy.  Lungs:Clear bilaterally, no wheezes, rhonci, crackles Cardiovascular: Regular rate and rhythm. No murmurs, gallops or rubs. Abdomen:Soft. Bowel sounds present. Non-tender.  Extremities: No lower extremity edema. Pulses are 2 + in the bilateral DP/PT.  Echo August 2023: 1. Left ventricular ejection fraction, by estimation, is 60 to 65%. The  left ventricle has normal function. The left ventricle has no regional  wall motion abnormalities. Left ventricular diastolic function could not  be evaluated.   2. Right ventricular systolic function is normal. The right ventricular  size is normal.   3. The mitral valve has been repaired/replaced. No evidence of mitral  valve regurgitation. Mild mitral stenosis. The mean mitral valve gradient  is 5.0 mmHg. There is a prosthetic annuloplasty ring present in the mitral  position. Procedure Date: 2011.   4. The aortic valve is grossly normal. Aortic valve regurgitation is not  visualized. No aortic stenosis is present.   5. The inferior vena cava is normal in size with greater than 50%  respiratory variability, suggesting right atrial pressure of 3 mmHg.    EKG:  EKG is not ordered today. The ekg ordered today demonstrates   Recent Labs: 11/18/2021: ALT 14 01/10/2022: BUN 16; Creatinine, Ser 0.99; Potassium 3.9; Sodium 135 02/01/2022: Hemoglobin 13.0; Platelets 359   Lipid Panel  Lipid Panel     Component Value Date/Time   CHOL 134  02/21/2022 0936   TRIG 120 02/21/2022 0936   HDL 46 02/21/2022 0936   CHOLHDL 2.9 02/21/2022 0936   CHOLHDL 8.9 04/03/2009 0320   VLDL 54 (H) 04/03/2009 0320   LDLCALC 66 02/21/2022 0936    Wt Readings from Last 3 Encounters:  08/07/22 93.9 kg  03/09/22 93.5 kg  02/08/22 93.2 kg    Assessment and Plan:   1. CAD with stable angina: No chest pain suggestive of angina. Continue ASA, Plavix. Statin, Imdur and beta blocker.    2. HYPERLIPIDEMIA: LDL at goal in November 2023. Continue statin.    3. HYPERTENSION: BP is well controlled. No changes today  4. TOBACCO ABUSE, in remission: She stopped smoking in 2010.   5. Mitral regurgitation: Echo in 2023 showed that the mitral valve repair was stable with mild stenosis.    6. Chronic diastolic CHF: No volume overload on exam. Weight is stable. No changes today  7. Leg pain: ABI normal November 2018  8. Insomnia: Continue Ambien  9. Pre-operative cardiovascular examination: No concerning symptoms to suggest angina, CHF, arrhythmias. She can proceed with her planned surgery. She can hold ASA and Plavix 5 days before her surgery. (Surgery with Dr. Melvyn Novas)  Labs/ tests ordered today include:  No orders of the defined types were placed in this encounter.  Disposition:   F/U with me one year  Signed, Verne Carrow, MD 08/07/2022 12:17 PM    [facility] [address] [place], [place], [place]  [address] Phone: [phone]; Fax: [phone]

## 2022-08-07 NOTE — Patient Instructions (Signed)
Medication Instructions:  No changes *If you need a refill on your cardiac medications before your next appointment, please call your pharmacy*   Lab Work: none   Testing/Procedures: none   Follow-Up: At [facility], you and your health needs are our priority.  As part of our continuing mission to provide you with exceptional heart care, we have created designated Provider Care Teams.  These Care Teams include your primary Cardiologist (physician) and Advanced Practice Providers (APPs -  Physician Assistants and Nurse Practitioners) who all work together to provide you with the care you need, when you need it.   Your next appointment:   12 month(s)  Provider:   Christopher McAlhany, MD   

## 2022-08-10 ENCOUNTER — Other Ambulatory Visit: Payer: Self-pay | Admitting: Cardiovascular Disease

## 2022-08-16 ENCOUNTER — Other Ambulatory Visit: Payer: Self-pay | Admitting: Cardiovascular Disease

## 2022-09-06 VITALS — BP 138/66 | HR 64 | Temp 98.4°F | Wt 203.0 lb

## 2022-09-06 DIAGNOSIS — Z006 Encounter for examination for normal comparison and control in clinical research program: Secondary | ICD-10-CM

## 2022-09-06 MED ORDER — STUDY - OCEAN(A) - OLPASIRAN (AMG 890) 142 MG/ML OR PLACEBO SQ INJECTION (PI-HILTY)
142.0000 mg | PREFILLED_SYRINGE | Freq: Once | SUBCUTANEOUS | Status: AC
  Administered 2022-09-06: 142 mg via SUBCUTANEOUS
  Filled 2022-09-06: qty 1

## 2022-09-06 NOTE — Research (Addendum)
[name] week 24  Patient seen in clinic today for week  24 visit of [name] trial. Patient denies any adverse events since last visit. Reviewed medications with patient and no changes noted at this visit. All procedures and  Lab work completed per protocol and patient tolerated well without complaints. IP injection was given at 0955 in the L arm without any complications noted.  Next visit Week 36 scheduled for 11/29/22.  Reviewed lipid blinding with the patient and he/she understands.  Pt had surgery on her Right arm due to an accident 2 years ago. Pt stated she has been getting cortisone shots in her arm for the injured area but they are not working so pt had surgery to fix it last week. Pt also stated they changed her Greggory Keen was changed back to Ozempic, she said neither of them work very well for her and she is thinking about stopping it.  Patient was monitored for 30 minutes after IP injection.  PK drawn at 1128.     [name] IP ADMINISTRATION Subject ID# [id number] IP Box #:[id number] IP lot#:[id number] Time [id number] Administration site: L arm   Current Outpatient Medications:    aspirin 81 MG tablet, Take 81 mg by mouth daily. , Disp: , Rfl:    carvedilol (COREG) 3.125 MG tablet, TAKE 1 TABLET BY MOUTH TWICE A DAY, Disp: 180 tablet, Rfl: 3   clopidogrel (PLAVIX) 75 MG tablet, TAKE 1 TABLET BY MOUTH EVERY DAY WITH BREAKFAST, Disp: 90 tablet, Rfl: 3   ezetimibe (ZETIA) 10 MG tablet, TAKE 1 TABLET BY MOUTH EVERY DAY, Disp: 90 tablet, Rfl: 3   gabapentin (NEURONTIN) 300 MG capsule, Take 300 mg by mouth 3 (three) times daily. , Disp: , Rfl:    insulin aspart (NOVOLOG) 100 UNIT/ML injection, Inject 4-10 Units into the skin See admin instructions. Per sliding scale Under 150 no units 150-199 take 8 units 200-249 take 10 units 250-299 take 12 units, Disp: , Rfl:    isosorbide mononitrate (IMDUR) 60 MG 24 hr tablet, TAKE 1 TABLET BY MOUTH EVERY DAY, Disp: 90 tablet, Rfl: 1   metFORMIN  (GLUCOPHAGE-XR) 500 MG 24 hr tablet, Take 500 mg by mouth in the morning and at bedtime. , Disp: , Rfl:    nitroGLYCERIN (NITROSTAT) 0.4 MG SL tablet, PLACE 1 TABLET UNDER THE TONGUE EVERY 5 MINUTES AS NEEDED FOR CHEST PAIN (UP TO 3 DOSES/15MIN), Disp: 75 tablet, Rfl: 2   oxyCODONE-acetaminophen (PERCOCET) 10-325 MG per tablet, Take 1 tablet by mouth 4 (four) times daily., Disp: , Rfl:    OZEMPIC, 2 MG/DOSE, 8 MG/3ML SOPN, once a week., Disp: , Rfl:    pantoprazole (PROTONIX) 40 MG tablet, Take 1 tablet (40 mg total) by mouth daily., Disp: 30 tablet, Rfl: 11   rosuvastatin (CRESTOR) 40 MG tablet, TAKE 1 TABLET (40 MG TOTAL) EVERY EVENING BY MOUTH., Disp: 90 tablet, Rfl: 3   sertraline (ZOLOFT) 25 MG tablet, Take 25 mg by mouth daily., Disp: , Rfl:    TRESIBA FLEXTOUCH 100 UNIT/ML SOPN FlexTouch Pen, Inject 60 Units as directed at bedtime. , Disp: , Rfl: 4   zolpidem (AMBIEN) 5 MG tablet, TAKE 1 TABLET BY MOUTH EVERY DAY AT BEDTIME AS NEEDED FOR SLEEP, Disp: 30 tablet, Rfl: 0   Continuous Blood Gluc Sensor (FREESTYLE LIBRE 2 SENSOR) MISC, Apply 1 Device topically every 14 (fourteen) days. Wears continuously changes every 14 days, Disp: , Rfl:    MOUNJARO 5 MG/0.5ML Pen, Inject 5 mg into the skin once a  week. (Patient not taking: Reported on 09/06/2022), Disp: , Rfl:   Current Facility-Administered Medications:    Study - [name] - olpasiran (AMG 890) 142 mg/mL or placebo SQ injection (PI-Hilty), 142 mg, Subcutaneous, Once, Hilty, Lisette Abu, MD

## 2022-09-07 NOTE — Progress Notes (Signed)
52 year old in week 73 Ocean.  Scheduled to see Earney Hamburg later today.  She underwent 4 vessel CABG and mitral valve repair in 2016.  She ultimately had multiple stents placed for failed grafts, including native vessel PCI as outlined in prior notes.  Currently doing well.  She has DM and elevated Lpa, really quite high.  She is on [place] a and doing well.    BP 138/66 (BP Location: Left Arm, Patient Position: Sitting, Cuff Size: Large)   Pulse 64   Temp 98.4 F (36.9 C) (Temporal)   Wt 203 lb (92.1 kg)   SpO2 98%   BMI 34.84 kg/m   BSA 2.04 m   Alert oriented in NAD Lungs clear to auscultation and percussion Cor reg Prior Sternotomy.   Abd  no masses Ext  no edema  ECG -  NSR.  Cannot exclude anterior MI.  No change from 02/01/2022  Week 24 sp [name] trial.    Continue IP and follow up in the trial.  Arturo Morton. Riley Kill, MD, [facility] Medical Director, [facility]

## 2022-09-11 NOTE — Research (Addendum)
[name] week 24 lab results:            Are there any labs that are clinically significant?  Yes []  OR No[x]   Dr.Hilty, pt stated since the last time we saw her, her DM doctor changed her from Ozempic to [facility] and they just recently changed her back to Ozempic. She said neither one of the medications have been working well. Last Hgb A1C was 7.5 and glucose was 175 in January 2024. She recently had surgery on her Right arm/elbow from an accident she had 2 years ago, she said she has been getting cortisone shots for 2 years and the shots were not working anymore.    Thanks. . That helps to explain the A1c.  Chrystie Nose, MD, [facility]  [place]  [facility]  Medical Director of the Advanced Lipid Disorders &  Cardiovascular Risk Reduction Clinic Diplomate of the American Board of Clinical Lipidology Attending Cardiologist  Direct Dial: [phone]  Fax: [phone]  Website:  [email or web address]

## 2022-09-24 ENCOUNTER — Other Ambulatory Visit: Payer: Self-pay | Admitting: Cardiovascular Disease

## 2022-10-01 ENCOUNTER — Other Ambulatory Visit: Payer: Self-pay | Admitting: Cardiovascular Disease

## 2022-10-26 ENCOUNTER — Other Ambulatory Visit: Payer: Self-pay | Admitting: Cardiovascular Disease

## 2022-11-29 ENCOUNTER — Encounter: Payer: Medicare HMO | Admitting: *Deleted

## 2022-11-29 DIAGNOSIS — Z006 Encounter for examination for normal comparison and control in clinical research program: Secondary | ICD-10-CM

## 2022-11-29 MED ORDER — STUDY - OCEAN(A) - OLPASIRAN (AMG 890) 142 MG/ML OR PLACEBO SQ INJECTION (PI-HILTY)
142.0000 mg | PREFILLED_SYRINGE | Freq: Once | SUBCUTANEOUS | Status: AC
  Administered 2022-11-29: 142 mg via SUBCUTANEOUS
  Filled 2022-11-29: qty 1

## 2022-11-29 NOTE — Research (Signed)
[name]  Week 36  Vitals: [x]  Experience any AE/SAE/Hospitalizations []  Yes [x]  No  If yes please explain:  Labs collected:  0908 Discussed with patient about the importance of not letting any one draw cholesterol or lipids. As to we are blinded to results. Reassured patient if we needed to be contacted the study team would reach out to our unblinded person.    Non-Fatal Potential Endpoint Assessment Yes  No   Has the subject experienced/undergone any of the following since the last visit/contact?   []   [x]    Any Coronary Artery Revascularization/Cerebrovascular Revascularization/ Peripheral Artery Revascularization/Amputation Procedure   []   [x]    Myocardial Infarction []   [x]    Stroke   []   [x]    Provide the date for the non-fatal Potential Endpoints status:   []   [x]     IP admin please see MAR (please add Box # to comment section on MAR) [x]    Current Outpatient Medications:    aspirin 81 MG tablet, Take 81 mg by mouth daily. , Disp: , Rfl:    carvedilol (COREG) 3.125 MG tablet, TAKE 1 TABLET BY MOUTH TWICE A DAY, Disp: 180 tablet, Rfl: 3   clopidogrel (PLAVIX) 75 MG tablet, TAKE 1 TABLET BY MOUTH EVERY DAY WITH BREAKFAST, Disp: 90 tablet, Rfl: 3   clotrimazole (LOTRIMIN) 1 % cream, Apply 1 Application topically 2 (two) times daily., Disp: , Rfl:    Continuous Blood Gluc Sensor (FREESTYLE LIBRE 2 SENSOR) MISC, Apply 1 Device topically every 14 (fourteen) days. Wears continuously changes every 14 days, Disp: , Rfl:    ezetimibe (ZETIA) 10 MG tablet, TAKE 1 TABLET BY MOUTH EVERY DAY, Disp: 90 tablet, Rfl: 3   gabapentin (NEURONTIN) 300 MG capsule, Take 300 mg by mouth 3 (three) times daily. , Disp: , Rfl:    insulin aspart (NOVOLOG) 100 UNIT/ML injection, Inject 4-10 Units into the skin See admin instructions. Per sliding scale Under 150 no units 150-199 take 8 units 200-249 take 10 units 250-299 take 12 units, Disp: , Rfl:    isosorbide mononitrate (IMDUR) 60 MG 24 hr tablet, TAKE  1 TABLET BY MOUTH EVERY DAY, Disp: 90 tablet, Rfl: 1   metFORMIN (GLUCOPHAGE-XR) 500 MG 24 hr tablet, Take 500 mg by mouth in the morning and at bedtime. , Disp: , Rfl:    nitroGLYCERIN (NITROSTAT) 0.4 MG SL tablet, PLACE 1 TABLET UNDER THE TONGUE EVERY 5 MINUTES AS NEEDED FOR CHEST PAIN (UP TO 3 DOSES/15MIN), Disp: 75 tablet, Rfl: 2   oxyCODONE-acetaminophen (PERCOCET) 10-325 MG per tablet, Take 1 tablet by mouth 4 (four) times daily., Disp: , Rfl:    OZEMPIC, 2 MG/DOSE, 8 MG/3ML SOPN, once a week., Disp: , Rfl:    pantoprazole (PROTONIX) 40 MG tablet, Take 1 tablet (40 mg total) by mouth daily., Disp: 30 tablet, Rfl: 11   rosuvastatin (CRESTOR) 40 MG tablet, TAKE 1 TABLET (40 MG TOTAL) BY MOUTH IN THE EVENING, Disp: 90 tablet, Rfl: 3   sertraline (ZOLOFT) 25 MG tablet, Take 25 mg by mouth daily., Disp: , Rfl:    TRESIBA FLEXTOUCH 100 UNIT/ML SOPN FlexTouch Pen, Inject 60 Units as directed at bedtime. , Disp: , Rfl: 4   zolpidem (AMBIEN) 5 MG tablet, TAKE 1 TABLET BY MOUTH EVERY DAY AT BEDTIME AS NEEDED FOR SLEEP, Disp: 30 tablet, Rfl: 0  Current Facility-Administered Medications:    Study - [name] - olpasiran (AMG 890) 142 mg/mL or placebo SQ injection (PI-Hilty), 142 mg, Subcutaneous, Once,

## 2022-12-01 NOTE — Research (Addendum)
Are there any labs that are clinically significant?  Yes []  OR No[x]   Please FORWARD back to me with any changes or follow up!    ACCESSION NO. [id number]                                             Page 1 of 1                                                        INVESTIGATOR: ([id number])                          PROTOCOL   [id number]                     Zoila Shutter M.D.                             INVESTIGATOR NO.: [id number]                     c/o Claretta Fraise                              SUBJECT ID: [id number]                     [facility]                 SUBJECT INITIALS NOT COLLECTED:                     [address] [place] [address]                 VISIT: [place], [place] [address]                   SPONSOR REPORT TO:                 COLLECTION TIME:09:08 DATE:29-Nov-2022                     Mertha Baars                      DATE RECEIVED IN LABORATORY: 30-Nov-2022                     c/o Sponsor(or Addtl) [facility].Study DATE REPORTED BY LABORATORY: 30-Nov-2022                     [facility]                          SEX: F  BIRTHDATE:  01-Oct-1970    AGE: 11K                     [address]  RANDOMIZATION NO.: [id number]                   [place], [place] [address]                                                                                        Ref. Ranges               Clinical    Comments                                                                          Significance                                                                            Yes*  No                    SM [id number] ANTIBODY COLL D/T                      CDate PreD     29-Nov-2022                                               CTime PreD     09:08                                                   SM [id number] LPA COLLECTION D/T                      Coll Date      29-Nov-2022                                               Coll Time       09:08                   CHEMISTRY PANEL                      Total Bili     0.4          0.2-1.2 mg/dL  Dir Bili       0.1          0.0-0.4 mg/dL                                Ind Bili       0.3          0.0-1.2 mg/dL                                Alk Phos       67           35-104 U/L                                   ALT (SGPT)     39           4-43 U/L                                    AST (SGOT)     39           8-40 U/L                                    Urea Nitr      15           4-24 mg/dL                                   Creatinine     0.89         0.35-1.14 mg/dL                              Calcium        9.4          8.3-10.6 mg/dL                              Total Prot     7.1          6.1-8.4 g/dL                                 Alb BCG        4.4          3.3-4.9 g/dL                                 CK             152          26-192 U/L                                   Sodium         139          132-147 mEq/L  Potassium      4.3          3.5-5.2 mEq/L                                Bicarb         23.2         19.3-29.3 mEq/L                              Chloride       104          94-112 mEq/L                               ADJUSTED CALCIUM                      Adj Calc       9.4          8.3-10.6 mg/d         HEMATOLOGY&DIFFERENTIAL PANEL                      HGB            14.0         11.6-16.4 g/dL                              HCT            40           34-48 %                                      RBC            4.2          4.1-5.6 x106/uL                              MCH            34           26-34 pg                                    MCHC           35           31-38 g/dL                                   RDW            13.3         12.0-15.0 %                                 RBC Morph      No Review Required  MCV            95           79-98 fL                                     WBC            6.49         3.80-10.70 x103/uL                           Neutrophil     3.97         1.96-7.23 x103/uL                           Lymphocyte     1.95         0.91-4.28 x103/uL                           Monocytes      0.36         0.12-0.92 x103/uL                           Eosinophil     0.16         0.00-0.57 x103/uL                           Basophils      0.05         0.00-0.20 x103/uL                           Neutrophil     61.1         40.5-75.0 %                                 Lymphocyte     30.0         15.4-48.5%                                   Monocytes      5.6          2.6-10.1 %                                   Eosinophil     2.5          0.0-6.8 %                                    Basophils      0.8          0.0-2.0 %                                    Platelets      280          140-400 x103/uL  ANC                      ANC            3.97         1.96-7.23 x103/uL         ANION GAP                      Anion Gap      16           7-18 mEq/L           EGFR                      CKDEPI eGF     77           mL/min/1.65m2                                                            No Ref Rng            ALT > 3 X ULN                      ALT>3XULN      To follow                                              ALT > 5 X ULN                      ALT>5XULN      To follow                                              ALT > 8 X ULN                      ALT>8XULN      To follow                                              ALT & TBIL                      ALT & TBIL     To follow                                              AST > 3 X ULN                      AST>3XULN      To follow  AST > 5 X ULN                      AST>5XULN      To follow                                              AST > 8 X ULN                      AST>8XULN      To follow                                               AST & TBIL                      AST & TBIL     To follow                    ALT > 3 X ULN                      ALT>3XULN      Criteria not met                                        ALT > 5 X ULN                      ALT>5XULN      Criteria not met                                        ALT > 8 X ULN                      ALT>8XULN      Criteria not met                                        ALT & TBIL                      ALT & TBIL     Criteria not met                                        AST > 3 X ULN                      AST>3XULN      Criteria not met                                        AST > 5 X ULN  AST>5XULN      Criteria not met                                        AST > 8 X ULN                      AST>8XULN      Criteria not met                                        AST & TBIL                      AST & TBIL     Criteria not met  Chrystie Nose, MD, [facility], FACP    [facility]  Medical Director of the [facility]  Cardiovascular Risk Reduction Clinic Diplomate of the American Board of Clinical Lipidology Attending Cardiologist  Direct Dial: [phone]  Fax: [phone]  Website:  [email or web address]

## 2022-12-28 ENCOUNTER — Other Ambulatory Visit: Payer: Self-pay | Admitting: Cardiovascular Disease

## 2023-01-26 ENCOUNTER — Other Ambulatory Visit: Payer: Self-pay | Admitting: Cardiovascular Disease

## 2023-02-09 ENCOUNTER — Telehealth: Payer: Self-pay | Admitting: *Deleted

## 2023-02-09 ENCOUNTER — Telehealth: Payer: Self-pay

## 2023-02-09 NOTE — Telephone Encounter (Signed)
Pt has been scheduled tele pre op appt 03/19/23. Med rec and consent are done.     Patient Consent for Virtual Visit        Elizabeth Park has provided verbal consent on 02/09/2023 for a virtual visit (video or telephone).   CONSENT FOR VIRTUAL VISIT FOR:  Elizabeth Park  By participating in this virtual visit I agree to the following:  I hereby voluntarily request, consent and authorize [facility] and its employed or contracted physicians, physician assistants, nurse practitioners or other licensed health care professionals (the Practitioner), to provide me with telemedicine health care services (the "Services") as deemed necessary by the treating Practitioner. I acknowledge and consent to receive the Services by the Practitioner via telemedicine. I understand that the telemedicine visit will involve communicating with the Practitioner through live audiovisual communication technology and the disclosure of certain medical information by electronic transmission. I acknowledge that I have been given the opportunity to request an in-person assessment or other available alternative prior to the telemedicine visit and am voluntarily participating in the telemedicine visit.  I understand that I have the right to withhold or withdraw my consent to the use of telemedicine in the course of my care at any time, without affecting my right to future care or treatment, and that the Practitioner or I may terminate the telemedicine visit at any time. I understand that I have the right to inspect all information obtained and/or recorded in the course of the telemedicine visit and may receive copies of available information for a reasonable fee.  I understand that some of the potential risks of receiving the Services via telemedicine include:  Delay or interruption in medical evaluation due to technological equipment failure or disruption; Information transmitted may not be sufficient (e.g. poor resolution of  images) to allow for appropriate medical decision making by the Practitioner; and/or  In rare instances, security protocols could fail, causing a breach of personal health information.  Furthermore, I acknowledge that it is my responsibility to provide information about my medical history, conditions and care that is complete and accurate to the best of my ability. I acknowledge that Practitioner's advice, recommendations, and/or decision may be based on factors not within their control, such as incomplete or inaccurate data provided by me or distortions of diagnostic images or specimens that may result from electronic transmissions. I understand that the practice of medicine is not an exact science and that Practitioner makes no warranties or guarantees regarding treatment outcomes. I acknowledge that a copy of this consent can be made available to me via my patient portal [facility] MyChart), or I can request a printed copy by calling the office of [facility].    I understand that my insurance will be billed for this visit.   I have read or had this consent read to me. I understand the contents of this consent, which adequately explains the benefits and risks of the Services being provided via telemedicine.  I have been provided ample opportunity to ask questions regarding this consent and the Services and have had my questions answered to my satisfaction. I give my informed consent for the services to be provided through the use of telemedicine in my medical care

## 2023-02-09 NOTE — Telephone Encounter (Signed)
Pt has been scheduled tele pre op appt 03/19/23. Med rec and consent are done.

## 2023-02-09 NOTE — Telephone Encounter (Signed)
   Pre-operative Risk Assessment    Patient Name: Elizabeth Park  DOB: Aug 10, 1970 MRN: [id number]   Last ov:08/07/2022 Upcoming visit:Unknown      Request for Surgical Clearance    Procedure:   Left hand volar mass excision, partial palmar fasciectomy  Date of Surgery:  Clearance 04/11/23                                 Surgeon:  Dr. Bradly Bienenstock Surgeon's Group or Practice Name:  [facility]   Phone number:  [phone] Fax number:  [phone]   Type of Clearance Requested:   - Medical  - Pharmacy:  Hold Aspirin Not indicated    Type of Anesthesia:  Not Indicated   Additional requests/questions:    Vance Peper   02/09/2023, 10:56 AM

## 2023-02-09 NOTE — Telephone Encounter (Signed)
   Name: Elizabeth Park  DOB: 16-Jul-1970  MRN: [id number]  Primary Cardiologist: Verne Carrow, MD   Preoperative team, please contact this patient and set up a phone call appointment for further preoperative risk assessment. Please obtain consent and complete medication review. Thank you for your help.  I confirm that guidance regarding antiplatelet and oral anticoagulation therapy has been completed and, if necessary, noted below.  Per protocol patient can hold aspirin and Plavix 5 days prior to her scheduled procedure.  I also confirmed the patient resides in the state of [place]. As per [facility] Medical Board telemedicine laws, the patient must reside in the state in which the provider is licensed.   Napoleon Form, Leodis Rains, NP 02/09/2023, 11:21 AM [facility]

## 2023-02-12 ENCOUNTER — Other Ambulatory Visit: Payer: Self-pay | Admitting: Physician Assistant

## 2023-02-12 DIAGNOSIS — Z1231 Encounter for screening mammogram for malignant neoplasm of breast: Secondary | ICD-10-CM

## 2023-02-14 ENCOUNTER — Encounter: Payer: Medicare HMO | Admitting: *Deleted

## 2023-02-14 DIAGNOSIS — Z006 Encounter for examination for normal comparison and control in clinical research program: Secondary | ICD-10-CM

## 2023-02-14 MED ORDER — STUDY - OCEAN(A) - OLPASIRAN (AMG 890) 142 MG/ML OR PLACEBO SQ INJECTION (PI-HILTY)
142.0000 mg | PREFILLED_SYRINGE | Freq: Once | SUBCUTANEOUS | Status: AC
  Administered 2023-02-14: 142 mg via SUBCUTANEOUS
  Filled 2023-02-14: qty 1

## 2023-02-14 NOTE — Research (Unsigned)
[name]  Week 48  Vitals: [x]  MD: [x]  TS  Experience any AE/SAE/Hospitalizations []  Yes [x]  No  If yes please explain:  Labs collected:  0958 Post PK draw (1 hr to 72 hr post IP) : 1130 Discussed with patient about the importance of not letting any one draw cholesterol or lipids. As to we are blinded to results. Reassured patient if we needed to be contacted the study team would reach out to our unblinded person.   *No labs next visit*  Non-Fatal Potential Endpoint Assessment Yes  No   Has the subject experienced/undergone any of the following since the last visit/contact?   []   [x]    Any Coronary Artery Revascularization/Cerebrovascular Revascularization/ Peripheral Artery Revascularization/Amputation Procedure   []   [x]    Myocardial Infarction []   [x]    Stroke   []   [x]    Provide the date for the non-fatal Potential Endpoints status:   []   [x]     IP admin please see MAR (please add Box # to comment section on MAR) [x]    Current Outpatient Medications:    aspirin 81 MG tablet, Take 81 mg by mouth daily. , Disp: , Rfl:    carvedilol (COREG) 3.125 MG tablet, TAKE 1 TABLET BY MOUTH TWICE A DAY, Disp: 180 tablet, Rfl: 3   clopidogrel (PLAVIX) 75 MG tablet, TAKE 1 TABLET BY MOUTH EVERY DAY WITH BREAKFAST, Disp: 90 tablet, Rfl: 3   clotrimazole (LOTRIMIN) 1 % cream, Apply 1 Application topically 2 (two) times daily., Disp: , Rfl:    Continuous Blood Gluc Sensor (FREESTYLE LIBRE 2 SENSOR) MISC, Apply 1 Device topically every 14 (fourteen) days. Wears continuously changes every 14 days, Disp: , Rfl:    [START ON 02/18/2023] estradiol (VIVELLE-DOT) 0.025 MG/24HR, Place 1 patch onto the skin 2 (two) times a week., Disp: , Rfl:    ezetimibe (ZETIA) 10 MG tablet, TAKE 1 TABLET BY MOUTH EVERY DAY, Disp: 90 tablet, Rfl: 3   gabapentin (NEURONTIN) 300 MG capsule, Take 300 mg by mouth 3 (three) times daily. , Disp: , Rfl:    insulin aspart (NOVOLOG) 100 UNIT/ML injection, Inject 4-10 Units into  the skin See admin instructions. Per sliding scale Under 150 no units 150-199 take 8 units 200-249 take 10 units 250-299 take 12 units, Disp: , Rfl:    isosorbide mononitrate (IMDUR) 60 MG 24 hr tablet, TAKE 1 TABLET BY MOUTH EVERY DAY, Disp: 90 tablet, Rfl: 0   metFORMIN (GLUCOPHAGE-XR) 500 MG 24 hr tablet, Take 500 mg by mouth in the morning and at bedtime. , Disp: , Rfl:    nitroGLYCERIN (NITROSTAT) 0.4 MG SL tablet, PLACE 1 TABLET UNDER THE TONGUE EVERY 5 MINUTES AS NEEDED FOR CHEST PAIN (UP TO 3 DOSES/15MIN), Disp: 75 tablet, Rfl: 2   oxyCODONE-acetaminophen (PERCOCET) 10-325 MG per tablet, Take 1 tablet by mouth 4 (four) times daily., Disp: , Rfl:    OZEMPIC, 2 MG/DOSE, 8 MG/3ML SOPN, once a week., Disp: , Rfl:    pantoprazole (PROTONIX) 40 MG tablet, Take 1 tablet (40 mg total) by mouth daily., Disp: 30 tablet, Rfl: 11   rosuvastatin (CRESTOR) 40 MG tablet, TAKE 1 TABLET (40 MG TOTAL) BY MOUTH IN THE EVENING, Disp: 90 tablet, Rfl: 3   sertraline (ZOLOFT) 25 MG tablet, Take 25 mg by mouth daily., Disp: , Rfl:    TRESIBA FLEXTOUCH 100 UNIT/ML SOPN FlexTouch Pen, Inject 60 Units as directed at bedtime. , Disp: , Rfl: 4   zolpidem (AMBIEN) 5 MG tablet, TAKE  1 TABLET BY MOUTH EVERY DAY AT BEDTIME AS NEEDED FOR SLEEP, Disp: 30 tablet, Rfl: 0

## 2023-02-14 NOTE — Progress Notes (Addendum)
Patient is in for her follow up for [name].  This is week 48.  She is tolerating IP well without complaint.    BP 130/85  Patient Position Sitting  Pulse 61  Resp 16  Temp 97.5 F (36.4 C)  SpO2 98 %  Weight 205 lb (93 kg)  Alert, oriented female in NAD Lungs clear Cor reg Abd soft without masses Multiple tattoos. Ext no edema  ECG  NSR. WNL.  No significant change from 09/06/2022.  Delay in R wave progression remains unchanged from prior tracing.  This likely is due to lead placement/cardiac axis.     Impression   Week 48 [name] Plan  Continue current program.  She is agreeable.   Arturo Morton. Riley Kill, MD, [facility] Medical Director, [facility]

## 2023-02-26 NOTE — Research (Addendum)
Are there any labs that are clinically significant?  Yes []  OR No[x]   Chrystie Nose, MD, Milagros Loll  [place]  [facility]  Medical Director of the [facility]  Card[facility]ovascular Risk Reduction Clinic Diplomate of the American Board of Clinical Lipidology Attending Cardiologist  Direct Dial: [phone]  Fax: [phone]  Website:  [email or web address]   Please FORWARD back to me with any changes or follow up!    ACCESSION NO. [id number]                                             Page 1 of 1                                                        INVESTIGATOR: ([id number])                          PROTOCOL   [id number]                     Zoila Shutter M.D.                             INVESTIGATOR NO.: [id number]                     c/o Claretta Fraise                              SUBJECT ID: [id number]                     [facility]                 SUBJECT INITIALS NOT COLLECTED:                     [address] [place] [address]                 VISIT: Michelene Gardener, [place] [address]                   SPONSOR REPORT TO:                 COLLECTION TIME:09:58 DATE:14-Feb-2023                     Mertha Baars                      DATE RECEIVED IN LABORATORY: 15-Feb-2023                     c/o Sponsor(or Addtl) [employer].Study DATE REPORTED BY LABORATORY: 15-Feb-2023                     [facility]  SEX: F  BIRTHDATE:  01-Oct-1970    AGE: 60Z                     [address]                  Wallis Bamberg NO.: [phone]                   [place], [place] [address]                                                                                        Ref. Ranges               Clinical    Comments                                                                          Significance                                                                            Yes*  No                    FASTING GLUCOSE                       Glucose        150     H    70-100 mg/dL       84/13/2440  =  102                           IS SUBJECT FASTING?                      Fasting?       Yes                              EGFR                      CKDEPI eGF     89           mL/min/1.40m2                                                            No Ref Rng  HS-CRP                      CRPHS          0.17         0.00-0.50 mg/dL                            HBA1C                      Hgb A1c        8.3     H    <6.5%     09/06/2022  =  8.0                     CHEMISTRY PANEL                      Total Bili     0.4          0.2-1.2 mg/dL                                Dir Bili       0.1          0.0-0.4 mg/dL                                Ind Bili       0.3          0.0-1.2 mg/dL                                Alk Phos       80           35-104 U/L                                   ALT (SGPT)     31           4-43 U/L                                    AST (SGOT)     30           8-40 U/L                                    Urea Nitr      19           4-24 mg/dL                                   Creatinine     0.79         0.35-1.14 mg/dL                              Calcium        9.9          8.3-10.6 mg/dL  Total Prot     7.7          6.1-8.4 g/dL                                 Alb BCG        4.8          3.3-4.9 g/dL                                 CK             183          26-192 U/L                                   Sodium         143          132-147 mEq/L                                Potassium      4.1          3.5-5.2 mEq/L                                Bicarb         22.3         19.3-29.3 mEq/L                              Chloride       102          94-112 mEq/L                               ADJUSTED CALCIUM                      Adj Calc       9.9          8.3-10.6 mg/dL   HEMATOLOGY&DIFFERENTIAL PANEL                      HGB             14.6         11.6-16.4 g/dL                              HCT            43           34-48 %                                      RBC            4.7          4.1-5.6 x106/uL                              MCH  31           26-34 pg                                    MCHC           34           31-38 g/dL                                   RDW            12.7         12.0-15.0 %                                 RBC Morph      No Review Required                                       MCV            93           79-98 fL                                    WBC            6.79         3.80-10.70 x103/uL                           Neutrophil     3.82         1.96-7.23 x103/uL                           Lymphocyte     2.23         0.91-4.28 x103/uL                           Monocytes      0.43         0.12-0.92 x103/uL                           Eosinophil     0.18         0.00-0.57 x103/uL                           Basophils      0.13         0.00-0.20 x103/uL                           Neutrophil     56.2         40.5-75.0 %                                 Lymphocyte     32.9         15.4-48.5%  Monocytes      6.3          2.6-10.1 %                                   Eosinophil     2.7          0.0-6.8 %                                    Basophils      1.9          0.0-2.0 %                                    Platelets      332          140-400 x103/uL                            ANC                      ANC            3.82         1.96-7.23 x103/uL   ANION GAP                      Anion Gap      23      H    7-18 mEq/L        11/29/2022  =  16                     COAGULATION GROUP                      APTT           To follow                                                PT             To follow                                                INR            To follow                                              ALT & INR                      ALT & INR       To follow  AST & INR                      AST & INR      To follow       ALT > 3 X ULN                      ALT>3XULN      Criteria not met                                        ALT > 5 X ULN                      ALT>5XULN      Criteria not met                                        ALT > 8 X ULN                      ALT>8XULN      Criteria not met                                        ALT & TBIL                      ALT & TBIL     Criteria not met                                        AST > 3 X ULN                      AST>3XULN      Criteria not met                                        AST > 5 X ULN                      AST>5XULN      Criteria not met                                        AST > 8 X ULN                      AST>8XULN      Criteria not met                                        AST & TBIL                      AST & TBIL     Criteria not met  SM [id number] ANTIBODY COLL D/T                      CDate PreD     14-Feb-2023                                               CTime PreD     09:58                                                   SM [id number] LPA COLLECTION D/T                      Coll Date      14-Feb-2023                                               Coll Time      09:58                                                   SM [id number] PK COLLECTION D/T                      Date PostD     14-Feb-2023                                               PostDoseTM     11:30                                                   SM [id number] BMD SERUM COLL D/T                      Coll Date      14-Feb-2023                                               Coll Time      09:58                                                   SM [id number] BMD PLASMA COLL D/T                      Coll Date      14-Feb-2023  Coll Time      09:58      COAGULATION  GROUP                      APTT         22.3         21.9-29.4 sec                                PT             9.8          9.7-12.3 sec                                 INR            0.9          Patient not taking                                                       oral anticoagulant:                                                      0.8 - 1.2                                                                Patient taking                                                          oral anticoagulant:                                                      2.0 - 3.0                                  ALT & INR                      ALT & INR      To follow                                              AST & INR                      AST &  INR      To follow   ALT & INR                      ALT & INR      Criteria not met                                        AST & INR                      AST & INR      Criteria not met      Dear Investigator, As you are aware and as outlined in the protocol, lipid values are being monitored on an ongoing basis. Please be advised that lipid results for this visit are within expectations. Regards, [facility]

## 2023-03-15 ENCOUNTER — Ambulatory Visit
Admission: RE | Admit: 2023-03-15 | Discharge: 2023-03-15 | Disposition: A | Discharge status: Home / Self Care | Payer: Medicare HMO | Source: Ambulatory Visit | Attending: Physician Assistant | Admitting: Physician Assistant

## 2023-03-15 DIAGNOSIS — Z1231 Encounter for screening mammogram for malignant neoplasm of breast: Secondary | ICD-10-CM

## 2023-03-19 ENCOUNTER — Ambulatory Visit: Payer: Medicare HMO | Attending: Cardiovascular Disease | Admitting: Nurse Practitioner

## 2023-03-19 DIAGNOSIS — Z01810 Encounter for preprocedural cardiovascular examination: Secondary | ICD-10-CM | POA: Diagnosis not present

## 2023-03-19 NOTE — Progress Notes (Signed)
Virtual Visit via Telephone Note   Because of Elizabeth Park's co-morbid illnesses, she is at least at moderate risk for complications without adequate follow up.  This format is felt to be most appropriate for this patient at this time.  The patient did not have access to video technology/had technical difficulties with video requiring transitioning to audio format only (telephone).  All issues noted in this document were discussed and addressed.  No physical exam could be performed with this format.  Please refer to the patient's chart for her consent to telehealth for [facility].  Evaluation Performed:  Preoperative cardiovascular risk assessment _____________   Date:  03/19/2023   Patient ID:  Elizabeth Park, DOB 10-09-1970, MRN [id number] Patient Location:  Home Provider location:   Office  Primary Care Provider:  Maye Hides, PA Primary Cardiologist:  Verne Carrow, MD  Chief Complaint / Patient Profile   52 y.o. y/o female with a h/o CAD s/p CABG x 4 in 2011, chronic diastolic heart failure, mitral valve regurgitation s/p mitral valve repair, hypertension, hyperlipidemia, and type 2 diabetes who is pending Left hand volar mass excision, partial palmar fasciectomy on 04/11/2023 with Dr. Bradly Bienenstock of [facility] and presents today for telephonic preoperative cardiovascular risk assessment.  History of Present Illness    Elizabeth Park is a 52 y.o. female who presents via audio/video conferencing for a telehealth visit today.  Pt was last seen in cardiology clinic on 08/07/2022 by Dr. Clifton James.  At that time Elizabeth Park was doing well.  The patient is now pending procedure as outlined above. Since her last visit, she has done well from a cardiac standpoint.   She denies chest pain, palpitations, dyspnea, pnd, orthopnea, n, v, dizziness, syncope, edema, weight gain, or early satiety. All other systems reviewed and are otherwise negative except as noted above.   Past  Medical History    Past Medical History:  Diagnosis Date   Chronic diastolic CHF (congestive heart failure) (HCC)    Coronary artery disease    a. 4v CABG  (LIMA to LAD, SVG to subbranch of ramus intermediate, SVG to lateral subbranch of ramus intermediate, SVG to distal RCA) and mitral valve repair January 2011. b. Abnormal nuc 09/2016 s/p  drug eluting stent to the vein graft to the PDA, a drug eluting stent in the native Ramus intermediate branch and a drug eluting stent in the protected left main. c. DES to ramus intermediate 02/2017.    Diabetes mellitus    H/O emotional problems    Headache, migraine    High cholesterol    History of blood transfusion    History of nuclear stress test    Myoview 07/2019: EF 60, normal perfusion, low risk.   Hypertension    MI, old    MRSA (methicillin resistant Staphylococcus aureus)    MVP (mitral valve prolapse)    Neuromuscular disorder (HCC)    Neuropathy    S/P mitral valve repair    Sebaceous cyst    Past Surgical History:  Procedure Laterality Date   ABDOMINAL HYSTERECTOMY  08/2004   CARDIAC CATHETERIZATION  09/22/2016   CORONARY ARTERY BYPASS GRAFT  with valve repair   CORONARY STENT INTERVENTION N/A 09/22/2016   Procedure: Coronary Stent Intervention;  Surgeon: Kathleene Hazel, MD;  Location: [facility] INVASIVE CV LAB;  Service: Cardiovascular;  Laterality: N/A;   CORONARY STENT INTERVENTION N/A 02/07/2017   Procedure: CORONARY STENT INTERVENTION;  Surgeon: Swaziland, Peter M,  MD;  Location: [facility] INVASIVE CV LAB;  Service: Cardiovascular;  Laterality: N/A;   CORONARY STENT INTERVENTION N/A 01/30/2020   Procedure: CORONARY STENT INTERVENTION;  Surgeon: Kathleene Hazel, MD;  Location: [facility] INVASIVE CV LAB;  Service: Cardiovascular;  Laterality: N/A;   CORONARY STENT INTERVENTION N/A 01/09/2022   Procedure: CORONARY STENT INTERVENTION;  Surgeon: Yvonne Kendall, MD;  Location: [facility]VAS[facility] CV [facility];  Service: Cardiovascular;  Laterality:  N/A;   CORONARY STENT PLACEMENT  09/22/2016   LEFT HEART CATH AND CORS/GRAFTS ANGIOGRAPHY N/A 09/22/2016   Procedure: Left Heart Cath and Cors/Grafts Angiography;  Surgeon: Kathleene Hazel, MD;  Location[facility] INVASIVE CV LAB;  Service: Cardiovascular;  Laterality: N/A;   LEFT HEART CATH AND CORS/GRAFTS ANGIOGRAPHY N/A 02/07/2017   Procedure: LEFT HEART CATH AND CORS/GRAFTS ANGIOGRAPHY;  Surgeon: Swaziland, Peter M, MD;  Location: [facility] INVASIVE CV LAB;  Service: Cardiovascular;  Laterality: N/A;   LEFT HEART CATH AND CORS/GRAFTS ANGIOGRAPHY N/A 01/30/2020   Procedure: LEFT HEART CATH AND CORS/GRAFTS ANGIOGRAPHY;  Surgeon: Kathleene Hazel, MD;  Location: [facility];  Service: Cardiovascular;  Laterality: N/A;   LEFT HEART CATH AND CORS/GRAFTS ANGIOGRAPHY N/A 01/09/2022   Procedure: LEFT HEART CATH AND CORS/GRAFTS ANGIOGRAPHY;  Surgeon: Yvonne Kendall, MD;  Location: [facility] INVASIVE CV [facility];  Service: Cardiovascular;  Laterality: N/A;   SKIN GRAFT      Allergies  Allergies  Allergen Reactions   Metformin Diarrhea and Nausea And Vomiting    Immediate release tablets (not metformin ER, pt currently taking)   Jardiance [Empagliflozin]     Yeast infections   Wound Dressing Adhesive Other (See Comments)    Burns skin/ bandages    Home Medications    Prior to Admission medications   Medication Sig Start Date End Date Taking? Authorizing Provider  aspirin 81 MG tablet Take 81 mg by mouth daily.  11/02/11   Herby Abraham, MD  carvedilol (COREG) 3.125 MG tablet TAKE 1 TABLET BY MOUTH TWICE A DAY 08/16/22   Kathleene Hazel, MD  clopidogrel (PLAVIX) 75 MG tablet TAKE 1 TABLET BY MOUTH EVERY DAY WITH BREAKFAST 09/25/22   Kathleene Hazel, MD  clotrimazole (LOTRIMIN) 1 % cream Apply 1 Application topically 2 (two) times daily.    [provider]  Continuous Blood Gluc Sensor (FREESTYLE LIBRE 2 SENSOR) MISC Apply 1 Device topically every 14 (fourteen) days.  Wears continuously changes every 14 days    [provider]  estradiol (VIVELLE-DOT) 0.025 MG/24HR Place 1 patch onto the skin 2 (two) times a week. 02/18/23 02/12/24  [provider]  ezetimibe (ZETIA) 10 MG tablet TAKE 1 TABLET BY MOUTH EVERY DAY 08/10/22   Kathleene Hazel, MD  gabapentin (NEURONTIN) 300 MG capsule Take 300 mg by mouth 3 (three) times daily.  10/25/18   [provider]  insulin aspart (NOVOLOG) 100 UNIT/ML injection Inject 4-10 Units into the skin See admin instructions. Per sliding scale Under 150 no units 150-199 take 8 units 200-249 take 10 units 250-299 take 12 units    [provider]  isosorbide mononitrate (IMDUR) 60 MG 24 hr tablet TAKE 1 TABLET BY MOUTH EVERY DAY 01/26/23   Kathleene Hazel, MD  metFORMIN (GLUCOPHAGE-XR) 500 MG 24 hr tablet Take 500 mg by mouth in the morning and at bedtime.  10/13/18   [provider]  nitroGLYCERIN (NITROSTAT) 0.4 MG SL tablet PLACE 1 TABLET UNDER THE TONGUE EVERY 5 MINUTES AS NEEDED FOR CHEST PAIN (UP  TO 3 DOSES/15MIN) 10/25/20   Kathleene Hazel, MD  oxyCODONE-acetaminophen (PERCOCET) 10-325 MG per tablet Take 1 tablet by mouth 4 (four) times daily.    [provider]  OZEMPIC, 2 MG/DOSE, 8 MG/3ML SOPN once a week. 08/29/22   [provider]  pantoprazole (PROTONIX) 40 MG tablet Take 1 tablet (40 mg total) by mouth daily. 05/29/19   Kathleene Hazel, MD  rosuvastatin (CRESTOR) 40 MG tablet TAKE 1 TABLET (40 MG TOTAL) BY MOUTH IN THE EVENING 10/26/22   Kathleene Hazel, MD  sertraline (ZOLOFT) 25 MG tablet Take 25 mg by mouth daily.    [provider]  TRESIBA FLEXTOUCH 100 UNIT/ML SOPN FlexTouch Pen Inject 60 Units as directed at bedtime.  08/13/16   [provider]  zolpidem (AMBIEN) 5 MG tablet TAKE 1 TABLET BY MOUTH EVERY DAY AT BEDTIME AS NEEDED FOR SLEEP 02/01/22   Laurann Montana, PA-C    Physical Exam    Vital Signs:   Elizabeth Park does not have vital signs available for review today.  Given telephonic nature of communication, physical exam is limited. AAOx3. NAD. Normal affect.  Speech and respirations are unlabored.  Accessory Clinical Findings    None  Assessment & Plan    1.  Preoperative Cardiovascular Risk Assessment:  According to the Revised Cardiac Risk Index (RCRI), her Perioperative Risk of Major Cardiac Event is (%): 6.6. Her Functional Capacity in METs is: 7.99 according to the Duke Activity Status Index (DASI). Therefore, based on ACC/AHA guidelines, patient would be at acceptable risk for the planned procedure without further cardiovascular testing.  The patient was advised that if she develops new symptoms prior to surgery to contact our office to arrange for a follow-up visit, and she verbalized understanding.  Per office protocol she may hold Plavix for 5 days and Aspirin for 5-7 days prior to procedure. Please resume Plavix and Aspirin as soon as possible postprocedure, at the discretion of the surgeon.    A copy of this note will be routed to requesting surgeon.  Time:   Today, I have spent 5 minutes with the patient with telehealth technology discussing medical history, symptoms, and management plan.     Joylene Grapes, NP  03/19/2023, 2:27 PM

## 2023-04-11 ENCOUNTER — Other Ambulatory Visit: Payer: Self-pay

## 2023-04-12 LAB — SURGICAL PATHOLOGY

## 2023-04-22 ENCOUNTER — Other Ambulatory Visit: Payer: Self-pay | Admitting: Cardiovascular Disease

## 2023-05-08 DIAGNOSIS — Z006 Encounter for examination for normal comparison and control in clinical research program: Secondary | ICD-10-CM

## 2023-05-08 MED ORDER — STUDY - OCEAN(A) - OLPASIRAN (AMG 890) 142 MG/ML OR PLACEBO SQ INJECTION (PI-HILTY)
142.0000 mg | PREFILLED_SYRINGE | Freq: Once | SUBCUTANEOUS | Status: AC
  Administered 2023-05-08: 142 mg via SUBCUTANEOUS
  Filled 2023-05-08: qty 1

## 2023-05-08 NOTE — Research (Signed)
 [name]  Week 60 Vitals: [x]  Experience any AE/SAE/Hospitalizations []  Yes [x]  No  If yes please explain:   Only urine lab needed is urine preg if applicable: []  Yes [x]  No if yes time:   Discussed with patient about the importance of not letting any one draw cholesterol or lipids. As to we are blinded to results. Reassured patient if we needed to be contacted the study team would reach out to our unblinded person.    Non-Fatal Potential Endpoint Assessment Yes  No   Has the subject experienced/undergone any of the following since the last visit/contact?   []   [x]    Any Coronary Artery Revascularization/Cerebrovascular Revascularization/ Peripheral Artery Revascularization/Amputation Procedure   []   [x]    Myocardial Infarction []   [x]    Stroke   []   [x]    Provide the date for the non-fatal Potential Endpoints status:   []   [x]     IP admin please see MAR (please add Box # to comment section on MAR) [x]    Current Outpatient Medications:    aspirin  81 MG tablet, Take 81 mg by mouth daily. , Disp: , Rfl:    carvedilol  (COREG ) 3.125 MG tablet, TAKE 1 TABLET BY MOUTH TWICE A DAY, Disp: 180 tablet, Rfl: 3   clopidogrel  (PLAVIX ) 75 MG tablet, TAKE 1 TABLET BY MOUTH EVERY DAY WITH BREAKFAST, Disp: 90 tablet, Rfl: 3   clotrimazole (LOTRIMIN) 1 % cream, Apply 1 Application topically 2 (two) times daily., Disp: , Rfl:    Continuous Blood Gluc Sensor (FREESTYLE LIBRE 2 SENSOR) MISC, Apply 1 Device topically every 14 (fourteen) days. Wears continuously changes every 14 days, Disp: , Rfl:    estradiol (VIVELLE-DOT) 0.025 MG/24HR, Place 1 patch onto the skin 2 (two) times a week., Disp: , Rfl:    ezetimibe  (ZETIA ) 10 MG tablet, TAKE 1 TABLET BY MOUTH EVERY DAY, Disp: 90 tablet, Rfl: 3   gabapentin  (NEURONTIN ) 300 MG capsule, Take 300 mg by mouth 3 (three) times daily. , Disp: , Rfl:    insulin  aspart (NOVOLOG ) 100 UNIT/ML injection, Inject 4-10 Units into the skin See admin instructions. Per  sliding scale Under 150 no units 150-199 take 8 units 200-249 take 10 units 250-299 take 12 units, Disp: , Rfl:    isosorbide  mononitrate (IMDUR ) 60 MG 24 hr tablet, TAKE 1 TABLET BY MOUTH EVERY DAY, Disp: 90 tablet, Rfl: 1   metFORMIN (GLUCOPHAGE-XR) 500 MG 24 hr tablet, Take 500 mg by mouth in the morning and at bedtime. , Disp: , Rfl:    nitroGLYCERIN  (NITROSTAT ) 0.4 MG SL tablet, PLACE 1 TABLET UNDER THE TONGUE EVERY 5 MINUTES AS NEEDED FOR CHEST PAIN (UP TO 3 DOSES/15MIN), Disp: 75 tablet, Rfl: 2   oxyCODONE -acetaminophen  (PERCOCET) 10-325 MG per tablet, Take 1 tablet by mouth 4 (four) times daily., Disp: , Rfl:    pantoprazole  (PROTONIX ) 40 MG tablet, Take 1 tablet (40 mg total) by mouth daily., Disp: 30 tablet, Rfl: 11   rosuvastatin  (CRESTOR ) 40 MG tablet, TAKE 1 TABLET (40 MG TOTAL) BY MOUTH IN THE EVENING, Disp: 90 tablet, Rfl: 3   sertraline  (ZOLOFT ) 25 MG tablet, Take 25 mg by mouth daily., Disp: , Rfl:    TRESIBA  FLEXTOUCH 100 UNIT/ML SOPN FlexTouch Pen, Inject 60 Units as directed at bedtime. , Disp: , Rfl: 4   Vitamin D, Ergocalciferol, (DRISDOL) 1.25 MG (50000 UNIT) CAPS capsule, Take 50,000 Units by mouth once a week., Disp: , Rfl:    zolpidem  (AMBIEN ) 10 MG tablet, Take  10 mg by mouth at bedtime., Disp: , Rfl:    cyclobenzaprine (FLEXERIL) 10 MG tablet, Take 10 mg by mouth at bedtime., Disp: , Rfl:    MOUNJARO 7.5 MG/0.5ML Pen, Inject 7.5 mg into the skin once a week., Disp: , Rfl:

## 2023-06-27 ENCOUNTER — Encounter: Payer: Self-pay | Admitting: Cardiovascular Disease

## 2023-06-27 MED ORDER — NITROGLYCERIN 0.4 MG SL SUBL: 0.4000 mg | SUBLINGUAL_TABLET | SUBLINGUAL | 3 refills | Status: AC | PRN

## 2023-06-28 MED ORDER — AMOXICILLIN 500 MG PO TABS: ORAL_TABLET | ORAL | 1 refills | Status: DC

## 2023-06-28 NOTE — Telephone Encounter (Signed)
 Reviewed with Dr. Clifton James.  SBE prophylaxis for previous mitral valve repair.  Amox prescription sent.  Ntg refilled.

## 2023-08-04 ENCOUNTER — Other Ambulatory Visit: Payer: Self-pay | Admitting: Cardiovascular Disease

## 2023-08-08 ENCOUNTER — Other Ambulatory Visit: Payer: Self-pay | Admitting: Cardiovascular Disease

## 2023-08-08 ENCOUNTER — Encounter: Payer: Medicare HMO | Admitting: *Deleted

## 2023-08-08 MED ORDER — STUDY - OCEAN(A) - OLPASIRAN (AMG 890) 142 MG/ML OR PLACEBO SQ INJECTION (PI-HILTY)
142.0000 mg | PREFILLED_SYRINGE | Freq: Once | SUBCUTANEOUS | Status: AC
  Administered 2023-08-08: 142 mg via SUBCUTANEOUS
  Filled 2023-08-08: qty 1

## 2023-08-08 NOTE — Research (Addendum)
 [name]  Week 72   Vitals: [x]  Experience any AE/SAE/Hospitalizations [x]   If yes please explain:   NO LABS AT NEXT 3 WEEK VISIT  Spoke with patient about lipid blinding and the importance.  Non-Fatal Potential Endpoint Assessment Yes  No   Has the subject experienced/undergone any of the following since the last visit/contact?   []   [x]    Any Coronary Artery Revascularization/Cerebrovascular Revascularization/ Peripheral Artery Revascularization/Amputation Procedure   []   [x]    Myocardial Infarction []   [x]    Stroke   []   [x]    Provide the date for the non-fatal Potential Endpoints status:   []   [x]    IP admin please see MAR (please add Box # to comment section on MAR) [x]   Elizabeth Park is here for Week 72 of [name]. She reports no visits to the Ed or urgent care, no changes in her meds, and no aabd pain. Vs taken at 10:56.Labs drawn at 11:10. Scheduled next appointment for Aug 4 at 1200.   Informed Consent  Re Consent   Subject Name: Elizabeth Park  Subject met inclusion and exclusion criteria.  The informed consent form, study requirements and expectations were reviewed with the subject and questions and concerns were addressed prior to the signing of the consent form.  The subject verbalized understanding of the trial requirements.  The subject agreed to participate in the [place] a  trial and signed the informed consent at 1054 on 08-Aug-2023.  The informed consent was obtained prior to performance of any protocol-specific procedures for the subject.  A copy of the signed informed consent was given to the subject and a copy was placed in the subject's medical record.     Subject re-consented to  Version: 7 IRB approved: 27-Jun-2023  Elizabeth Park   Current Outpatient Medications:    amoxicillin  (AMOXIL ) 500 MG tablet, Take FOUR tablets (2000 mg) by mouth one hour before any dental visits including cleanings, Disp: 8 tablet, Rfl: 1   aspirin  81 MG tablet, Take 81 mg by  mouth daily. , Disp: , Rfl:    carvedilol  (COREG ) 3.125 MG tablet, TAKE 1 TABLET BY MOUTH TWICE A DAY, Disp: 30 tablet, Rfl: 0   clopidogrel  (PLAVIX ) 75 MG tablet, TAKE 1 TABLET BY MOUTH EVERY DAY WITH BREAKFAST, Disp: 90 tablet, Rfl: 3   clotrimazole (LOTRIMIN) 1 % cream, Apply 1 Application topically 2 (two) times daily., Disp: , Rfl:    Continuous Blood Gluc Sensor (FREESTYLE LIBRE 2 SENSOR) MISC, Apply 1 Device topically every 14 (fourteen) days. Wears continuously changes every 14 days, Disp: , Rfl:    cyclobenzaprine (FLEXERIL) 10 MG tablet, Take 10 mg by mouth at bedtime., Disp: , Rfl:    estradiol (VIVELLE-DOT) 0.025 MG/24HR, Place 1 patch onto the skin 2 (two) times a week., Disp: , Rfl:    ezetimibe  (ZETIA ) 10 MG tablet, TAKE 1 TABLET BY MOUTH EVERY DAY, Disp: 30 tablet, Rfl: 0   gabapentin  (NEURONTIN ) 300 MG capsule, Take 300 mg by mouth 3 (three) times daily. , Disp: , Rfl:    insulin  aspart (NOVOLOG ) 100 UNIT/ML injection, Inject 4-10 Units into the skin See admin instructions. Per sliding scale Under 150 no units 150-199 take 8 units 200-249 take 10 units 250-299 take 12 units, Disp: , Rfl:    isosorbide  mononitrate (IMDUR ) 60 MG 24 hr tablet, TAKE 1 TABLET BY MOUTH EVERY DAY, Disp: 90 tablet, Rfl: 1   metFORMIN (GLUCOPHAGE-XR) 500 MG 24 hr tablet, Take 500  mg by mouth in the morning and at bedtime. , Disp: , Rfl:    MOUNJARO 7.5 MG/0.5ML Pen, Inject 7.5 mg into the skin once a week., Disp: , Rfl:    nitroGLYCERIN  (NITROSTAT ) 0.4 MG SL tablet, Place 1 tablet (0.4 mg total) under the tongue every 5 (five) minutes as needed for chest pain., Disp: 25 tablet, Rfl: 3   oxyCODONE -acetaminophen  (PERCOCET) 10-325 MG per tablet, Take 1 tablet by mouth 4 (four) times daily., Disp: , Rfl:    pantoprazole  (PROTONIX ) 40 MG tablet, Take 1 tablet (40 mg total) by mouth daily., Disp: 30 tablet, Rfl: 11   rosuvastatin  (CRESTOR ) 40 MG tablet, TAKE 1 TABLET (40 MG TOTAL) BY MOUTH IN THE EVENING, Disp: 90  tablet, Rfl: 3   sertraline  (ZOLOFT ) 25 MG tablet, Take 25 mg by mouth daily., Disp: , Rfl:    TRESIBA  FLEXTOUCH 100 UNIT/ML SOPN FlexTouch Pen, Inject 60 Units as directed at bedtime. , Disp: , Rfl: 4   Vitamin D, Ergocalciferol, (DRISDOL) 1.25 MG (50000 UNIT) CAPS capsule, Take 50,000 Units by mouth once a week., Disp: , Rfl:    zolpidem  (AMBIEN ) 10 MG tablet, Take 10 mg by mouth at bedtime., Disp: , Rfl:

## 2023-08-13 NOTE — Research (Addendum)
 Are there any labs that are clinically significant?  Yes []  OR No[x]   Please FORWARD back to me with any changes or follow up!   CK is higher - any symptoms such as muscle pain or weakness?  Elizabeth Lites, MD, [facility], FNLA, FACP  [place]  [facility]  Medical Director of the [facility]  [facility] Diplomate of the American Board of Clinical Lipidology Attending Cardiologist  Direct Dial: [phone]  Fax: [phone]  Website:  [email or web address]                  ACCESSION NO. [id number]                                             Page 1 of 1                                                        INVESTIGATOR: ([id number])                          PROTOCOL   [id number]                     Elizabeth Park M.D.                             INVESTIGATOR NO.: [id number]                     c/o Gracelyn Laurence                              SUBJECT ID: [id number]                     [facility]                 SUBJECT INITIALS NOT COLLECTED:                     [address] [place] [address]                 VISIT: [place], [place]  [id number]                   SPONSOR REPORT TO:                 COLLECTION TIME:11:10 DATE:08-Aug-2023                     Elizabeth Park                      DATE RECEIVED IN LABORATORY: 09-Aug-2023                     c/o Sponsor(or Addtl) [employer].Study DATE REPORTED BY LABORATORY: 13-Aug-2023  [facility]                          SEX: F  BIRTHDATE:  01-Oct-1970    AGE: 52y                     [address]                  Raeanne Park NO.: [phone]                   [place], [place]  [id number]                                                                                        Ref. Ranges               Clinical    Comments                                                                          Significance                                                                             Yes*  No                    SM [id number] ANTIBODY COLL D/T                      CDate PreD     08-Aug-2023                                               CTime PreD     11:10      CHEMISTRY PANEL                      Total Bili     0.4          0.2-1.2 mg/dL                                D-BilGen2      0.16         0.00-0.20 mg/dL                              Ind Bili  0.2          0.0-1.2 mg/dL                                Alk Phos       75           35-104 U/L                                   ALT (SGPT)     19           4-43 U/L                                    AST (SGOT)     23           8-40 U/L                                    Urea Nitr      17           4-24 mg/dL                                   Creatinine     0.86         0.35-1.14 mg/dL                              Calcium         9.3          8.3-10.6 mg/dL                              Total Prot     7.1          6.1-8.4 g/dL                                 Alb BCG        4.2          3.3-4.9 g/dL                                 CK             563     H    26-192 U/L              02/14/2023 = 183                                   Sodium         137          132-147 mEq/L                                Potassium      4.4          3.5-5.2  mEq/L                                Bicarb         21.2         19.3-29.3 mEq/L                              Chloride       99           94-112 mEq/L                               ADJUSTED CALCIUM                       Adj Calc       9.3          8.3-10.6 mg/dL                        HEMATOLOGY&DIFFERENTIAL PANEL                      HGB            13.7         11.6-16.4 g/dL                              HCT            40           34-48 %                                      RBC            4.4          4.1-5.6 x106/uL                              MCH            31           26-34 pg                                    MCHC           34            31-38 g/dL                                   RDW            13.8         12.0-15.0 %                                 RBC Morph      No Review Required  MCV            91           79-98 fL                                    WBC            6.47         3.80-10.70 x103/uL                           Neutrophil     4.26         1.96-7.23 x103/uL                           Lymphocyte     1.62         0.91-4.28 x103/uL                           Monocytes      0.36         0.12-0.92 x103/uL                           Eosinophil     0.16         0.00-0.57 x103/uL                           Basophils      0.07         0.00-0.20 x103/uL                           Neutrophil     65.9         40.5-75.0 %                                 Lymphocyte     25.1         15.4-48.5%                                   Monocytes      5.5          2.6-10.1 %                                   Eosinophil     2.4          0.0-6.8 %                                    Basophils      1.1          0.0-2.0 %                                    Platelets      352          140-400 x103/uL  ANC                      ANC            4.26         1.96-7.23 x103/uL                       ANION GAP                      Anion Gap      21      H    7-18 mEq/L        02/14/2023 = 23                     HBA1C                      Hgb A1c        7.7     H    <6.5%      02/14/2023 = 8.3

## 2023-08-15 ENCOUNTER — Other Ambulatory Visit: Payer: Self-pay | Admitting: Cardiovascular Disease

## 2023-08-17 NOTE — Research (Signed)
 Are there any labs that are clinically significant?  Yes []  OR No[]   Please FORWARD back to me with any changes or follow up!                                           ACCESSION NO. [id number]                                             Page 1 of 1                                                        INVESTIGATOR: ([id number])                          PROTOCOL   [id number]                     Dinah Franco M.D.                             INVESTIGATOR NO.: [id number]                     c/o Gracelyn Laurence                              SUBJECT ID: [id number]                     [facility]                 SUBJECT INITIALS NOT COLLECTED:                     [address] [place] [address]                 VIS[place]enc[place], [place]  [id number]                   SPONSOR REPORT TO:                 COLLECTION TIME:11:10 DATE:08-Aug-2023                     Lara Plants                      DATE RECEIVED IN LABORATORY: 09-Aug-2023                     c/o Sponsor(or Addtl) [employer].Study DATE REPORTED BY LABORATORY: 13-Aug-2023                     [facility]                          SEX: F  BIRTHDATE:  01-Oct-1970    AGE: 54L  [address]                  [name] NO.: [phone]                   [place], [place]  [id number]                                                                                        Ref. Ranges               Clinical    Comments                                                                          Significance                                                                            Yes*  No                    EGFR                      CKDEPI eGF     80           mL/min/1.21m2                                                            No Ref Rng                       ALT > 3 X ULN                      ALT>3XULN      Criteria not met                                        ALT > 5 X ULN                       ALT>5XULN      Criteria not met                                        ALT >  8 X ULN                      ALT>8XULN      Criteria not met                                        ALT & TBIL                      ALT & TBIL     Criteria not met                                        AST > 3 X ULN                      AST>3XULN      Criteria not met                                        AST > 5 X ULN                      AST>5XULN      Criteria not met                                        AST > 8 X ULN                      AST>8XULN      Criteria not met                                        AST & TBIL                      AST & TBIL     Criteria not met                      [employer]                      APTT           23.3         21.9-29.4 sec                                PT             10.3         9.7-12.3 sec                                 INR            1.0          Patient not taking  oral anticoagulant:                                                      0.8 - 1.2                                                                Patient taking                                                          oral anticoagulant:                                                      2.0 - 3.0                                  ALT & INR                      ALT & INR      Criteria not met                                        AST & INR                      AST & INR      Criteria not met

## 2023-08-30 ENCOUNTER — Other Ambulatory Visit: Payer: Self-pay | Admitting: Cardiovascular Disease

## 2023-09-04 ENCOUNTER — Other Ambulatory Visit: Payer: Self-pay

## 2023-09-04 MED ORDER — CARVEDILOL 3.125 MG PO TABS: 3.1250 mg | ORAL_TABLET | Freq: Two times a day (BID) | ORAL | 0 refills | Status: DC

## 2023-09-20 ENCOUNTER — Other Ambulatory Visit: Payer: Self-pay | Admitting: Cardiovascular Disease

## 2023-10-07 ENCOUNTER — Other Ambulatory Visit: Payer: Self-pay | Admitting: Cardiovascular Disease

## 2023-11-04 NOTE — Progress Notes (Unsigned)
 No chief complaint on file.   History of Present Illness: 53 yo female with history of CAD s/p 4V CABG 2011, mitral regurgitation s/p repair, chronic diastolic CHF, DM, HTN and hyperlipidemia who is here today for cardiac follow up. Cardiac cath  In December 2010 showed severe proximal LAD stenosis, severe mid intermediate disease with occluded mid RCA and anomalous Circumflex with severe disease. She also had severe mitral regurgitation. She underwent a 4V CABG  (LIMA to LAD, SVG to subbranch of ramus intermediate, SVG to lateral subbranch of ramus intermediate, SVG to distal RCA) and mitral valve repair January 2011. Echo September 2016 with normal LV systolic function, stable mitral valve repair. She had chest pain in June 2018 and her nuclear stress test showed ischemia. Cardiac cath on 09/22/16 showed progression of her CAD. LV function was normal. A drug eluting stent was placed in the vein graft to the PDA, a drug eluting stent was placed in the native Ramus intermediate branch and a drug eluting stent was placed in the protected left main. Repeat cardiac cath 02/07/17 and found to have continued occlusion of all three native vessels with patent LIMA to LAD, occluded SVG to ramus x 2, patent SVG to PDA but severe restenosis in the ramus intermediate stent. This was treated with another drug eluting stent. Her BP was soft in the hospital so Norvasc  was stopped.  Coreg  dose lowered at office visit in February 2019 due to fatigue. Echo August 2020 with LVEF=60-65%, mitral valve repair intact with mild stenosis. She had chest pain in April 2021. Nuclear stress test April 2021  without ischemia. Imdur  was added and her chest pain resolved. She had recurrent chest pain in October 2021. Cardiac cath 01/30/20 with 2/4 patent bypass grafts, chronic occlusion small Circumflex artery, chronic occlusion proximal RCA, patent left main stent. Patent ramus intermediate stent with severe distal stent stenosis which was  treated with a drug eluting stent. The LIMA to the LAD was patent. The vein graft to the PDA is patent but severe stenosis posterolateral artery. A drug eluting stent was placed in the native posterolateral artery through the vein graft. Echo August 2023 with normal LV function. Mild mitral stenosis s/p repair. She was seen in our office October 2023 with chest pain. Cardiac cath October 2023 with severe disease in the vein graft to PDA, treated with a drug eluting stent.   She is here today for follow up. The patient denies any chest pain, dyspnea, palpitations, lower extremity edema, orthopnea, PND, dizziness, near syncope or syncope.    Primary Care Physician: Cleotilde Bernardino Hutchinson, PA  Past Medical History:  Diagnosis Date   Chronic diastolic CHF (congestive heart failure) (HCC)    Coronary artery disease    a. 4v CABG  (LIMA to LAD, SVG to subbranch of ramus intermediate, SVG to lateral subbranch of ramus intermediate, SVG to distal RCA) and mitral valve repair January 2011. b. Abnormal nuc 09/2016 s/p  drug eluting stent to the vein graft to the PDA, a drug eluting stent in the native Ramus intermediate branch and a drug eluting stent in the protected left main. c. DES to ramus intermediate 02/2017.    Diabetes mellitus    H/O emotional problems    Headache, migraine    High cholesterol    History of blood transfusion    History of nuclear stress test    Myoview  07/2019: EF 60, normal perfusion, low risk.   Hypertension    MI, old  MRSA (methicillin resistant Staphylococcus aureus)    MVP (mitral valve prolapse)    Neuromuscular disorder (HCC)    Neuropathy    S/P mitral valve repair    Sebaceous cyst     Past Surgical History:  Procedure Laterality Date   ABDOMINAL HYSTERECTOMY  08/2004   CARDIAC CATHETERIZATION  09/22/2016   CORONARY ARTERY BYPASS GRAFT  with valve repair   CORONARY STENT INTERVENTION N/A 09/22/2016   Procedure: Coronary Stent Intervention;  Surgeon: Verlin Lonni BIRCH, MD;  Location: [facility] INVASIVE CV LAB;  Service: Cardiovascular;  Laterality: N/A;   CORONARY STENT INTERVENTION N/A 02/07/2017   Procedure: CORONARY STENT INTERVENTION;  Surgeon: Swaziland, Peter M, MD;  Location: [facility] INVASIVE CV LAB;  Service: Cardiovascular;  Laterality: N/A;   CORONARY STENT INTERVENTION N/A 01/30/2020   Procedure: CORONARY STENT INTERVENTION;  Surgeon: Verlin Lonni BIRCH, MD;  Location: [facility] INVASIVE CV LAB;  Service: Cardiovascular;  Laterality: N/A;   CORONARY STENT INTERVENTION N/A 01/09/2022   Procedure: CORONARY STENT INTERVENTION;  Surgeon: Mady Lonni, MD;  Location: [facility] INVASIVE CV LAB;  Service: Cardiovascular;  Laterality: N/A;   CORONARY STENT PLACEMENT  09/22/2016   LEFT HEART CATH AND CORS/GRAFTS ANGIOGRAPHY N/A 09/22/2016   Procedure: Left Heart Cath and Cors/Grafts Angiography;  Surgeon: Verlin Lonni BIRCH, MD;  Location: [facility] INVASIVE CV LAB;  Service: Cardiovascular;  Laterality: N/A;   LEFT HEART CATH AND CORS/GRAFTS ANGIOGRAPHY N/A 02/07/2017   Procedure: LEFT HEART CATH AND CORS/GRAFTS ANGIOGRAPHY;  Surgeon: Swaziland, Peter M, MD;  Location: [facility] INVASIVE CV LAB;  Service: Cardiovascular;  Laterality: N/A;   LEFT HEART CATH AND CORS/GRAFTS ANGIOGRAPHY N/A 01/30/2020   Procedure: LEFT HEART CATH AND CORS/GRAFTS ANGIOGRAPHY;  Surgeon: Verlin Lonni BIRCH, MD;  Location: [facility] INVASIVE CV LAB;  Service: Cardiovascular;  Laterality: N/A;   LEFT HEART CATH AND CORS/GRAFTS ANGIOGRAPHY N/A 01/09/2022   Procedure: LEFT HEART CATH AND CORS/GRAFTS ANGIOGRAPHY;  Surgeon: Mady Lonni, MD;  Location: [facility] INVASIVE CV LAB;  Service: Cardiovascular;  Laterality: N/A;   SKIN GRAFT      Current Outpatient Medications  Medication Sig Dispense Refill   amoxicillin  (AMOXIL ) 500 MG tablet Take FOUR tablets (2000 mg) by mouth one hour before any dental visits including cleanings 8 tablet 1   aspirin  81 MG tablet Take 81 mg by mouth daily.      carvedilol   (COREG ) 3.125 MG tablet Take 1 tablet (3.125 mg total) by mouth 2 (two) times daily. 180 tablet 0   clopidogrel  (PLAVIX ) 75 MG tablet TAKE 1 TABLET BY MOUTH EVERY DAY WITH BREAKFAST 90 tablet 0   clotrimazole (LOTRIMIN) 1 % cream Apply 1 Application topically 2 (two) times daily.     Continuous Blood Gluc Sensor (FREESTYLE LIBRE 2 SENSOR) MISC Apply 1 Device topically every 14 (fourteen) days. Wears continuously changes every 14 days     cyclobenzaprine (FLEXERIL) 10 MG tablet Take 10 mg by mouth at bedtime.     estradiol (VIVELLE-DOT) 0.025 MG/24HR Place 1 patch onto the skin 2 (two) times a week.     ezetimibe  (ZETIA ) 10 MG tablet TAKE 1 TABLET BY MOUTH EVERY DAY 90 tablet 0   gabapentin  (NEURONTIN ) 300 MG capsule Take 300 mg by mouth 3 (three) times daily.      insulin  aspart (NOVOLOG ) 100 UNIT/ML injection Inject 4-10 Units into the skin See admin instructions. Per sliding scale Under 150 no units 150-199 take 8 units 200-249 take 10 units 250-299 take 12 units     isosorbide   mononitrate (IMDUR ) 60 MG 24 hr tablet TAKE 1 TABLET BY MOUTH EVERY DAY 90 tablet 0   metFORMIN (GLUCOPHAGE-XR) 500 MG 24 hr tablet Take 500 mg by mouth in the morning and at bedtime.      MOUNJARO 7.5 MG/0.5ML Pen Inject 7.5 mg into the skin once a week.     nitroGLYCERIN  (NITROSTAT ) 0.4 MG SL tablet Place 1 tablet (0.4 mg total) under the tongue every 5 (five) minutes as needed for chest pain. 25 tablet 3   oxyCODONE -acetaminophen  (PERCOCET) 10-325 MG per tablet Take 1 tablet by mouth 4 (four) times daily.     pantoprazole  (PROTONIX ) 40 MG tablet Take 1 tablet (40 mg total) by mouth daily. 30 tablet 11   rosuvastatin  (CRESTOR ) 40 MG tablet TAKE 1 TABLET (40 MG TOTAL) BY MOUTH IN THE EVENING 90 tablet 3   sertraline  (ZOLOFT ) 25 MG tablet Take 25 mg by mouth daily.     TRESIBA  FLEXTOUCH 100 UNIT/ML SOPN FlexTouch Pen Inject 60 Units as directed at bedtime.   4   Vitamin D, Ergocalciferol, (DRISDOL) 1.25 MG (50000 UNIT)  CAPS capsule Take 50,000 Units by mouth once a week.     zolpidem  (AMBIEN ) 10 MG tablet Take 10 mg by mouth at bedtime.     No current facility-administered medications for this visit.    Allergies  Allergen Reactions   Metformin Diarrhea and Nausea And Vomiting    Immediate release tablets (not metformin ER, pt currently taking)   Jardiance [Empagliflozin]     Yeast infections   Wound Dressing Adhesive Other (See Comments)    Burns skin/ bandages    Social History   Socioeconomic History   Marital status: Divorced    Spouse name: Not on file   Number of children: 2   Years of education: Not on file   Highest education level: Not on file  Occupational History   Occupation: Disability  Tobacco Use   Smoking status: Former    Current packs/day: 0.00    Average packs/day: 1 pack/day for 28.0 years (28.0 ttl pk-yrs)    Types: Cigarettes    Start date: 03/31/1981    Quit date: 03/31/2009    Years since quitting: 14.6   Smokeless tobacco: Never  Vaping Use   Vaping status: Never Used  Substance and Sexual Activity   Alcohol  use: No   Drug use: No   Sexual activity: Yes    Birth control/protection: Surgical    Comment: partial hysterectomy  Other Topics Concern   Not on file  Social History Narrative   Not on file   Social Drivers of Health   Financial Resource Strain: Not on file  Food Insecurity: Not on file  Transportation Needs: No Transportation Needs (04/10/2017)   PRAPARE - Administrator, Civil Service (Medical): No    Lack of Transportation (Non-Medical): No  Physical Activity: Inactive (04/10/2017)   Exercise Vital Sign    Days of Exercise per Week: 0 days    Minutes of Exercise per Session: 0 min  Stress: No Stress Concern Present (04/10/2017)   [employer] of Occupational Health - Occupational Stress Questionnaire    Feeling of Stress : Only a little  Social Connections: Not on file  Intimate Partner Violence: Not on file    Family  History  Problem Relation Age of Onset   Heart disease Father    Asthma Father    Hypertension Father    Diabetes Father    Arthritis Father  COPD Father    Heart disease Mother    Hypertension Mother    Diabetes Mother    Migraines Mother    Stroke Mother    Emphysema Maternal Grandmother    Hypertension Brother    Diabetes Brother    Breast cancer Neg Hx     Review of Systems:  As stated in the HPI and otherwise negative.   There were no vitals taken for this visit.  Physical Examination:  General: Well developed, well nourished, NAD  HEENT: OP clear, mucus membranes moist  SKIN: warm, dry. No rashes. Neuro: No focal deficits  Musculoskeletal: Muscle strength 5/5 all ext  Psychiatric: Mood and affect normal  Neck: No JVD, no carotid bruits, no thyromegaly, no lymphadenopathy.  Lungs:Clear bilaterally, no wheezes, rhonci, crackles Cardiovascular: Regular rate and rhythm. No murmurs, gallops or rubs. Abdomen:Soft. Bowel sounds present. Non-tender.  Extremities: No lower extremity edema. Pulses are 2 + in the bilateral DP/PT.     EKG:  EKG is *** ordered today. The ekg ordered today demonstrates   Recent Labs: No results found for requested labs within last 365 days.   Lipid Panel  Lipid Panel     Component Value Date/Time   CHOL 134 02/21/2022 0936   TRIG 120 02/21/2022 0936   HDL 46 02/21/2022 0936   CHOLHDL 2.9 02/21/2022 0936   CHOLHDL 8.9 04/03/2009 0320   VLDL 54 (H) 04/03/2009 0320   LDLCALC 66 02/21/2022 0936    Wt Readings from Last 3 Encounters:  02/14/23 205 lb (93 kg)  11/29/22 204 lb (92.5 kg)  09/06/22 203 lb (92.1 kg)    Assessment and Plan:   1. CAD with stable angina: No chest pain suggestive of unstable angina. Continue ASA, Plavix , statin, beta blocker and Imdur .     2. HYPERLIPIDEMIA: LDL ***. Continue statin  3. HYPERTENSION: BP is controlled. Continue current therapy  4. TOBACCO ABUSE, in remission: She stopped smoking in  2010.   5. Mitral regurgitation: Echo in 2023 showed that the mitral valve repair was stable with mild stenosis.    6. Chronic diastolic CHF: No volume overload on exam. Wt is stable. No changes  7. Leg pain: ABI normal November 2018  8. Insomnia: Continue Ambien   Labs/ tests ordered today include:  No orders of the defined types were placed in this encounter.  Disposition:   F/U with me one year  Signed, Lonni Cash, MD 11/04/2023 4:28 PM    [facility] [address] [place], [place], [place]  [address] Phone: [phone]; Fax: [phone]

## 2023-11-05 ENCOUNTER — Encounter: Admitting: *Deleted

## 2023-11-05 ENCOUNTER — Other Ambulatory Visit: Payer: Self-pay

## 2023-11-05 ENCOUNTER — Ambulatory Visit: Attending: Cardiovascular Disease | Admitting: Cardiovascular Disease

## 2023-11-05 ENCOUNTER — Encounter: Payer: Self-pay | Admitting: Cardiovascular Disease

## 2023-11-05 VITALS — BP 114/72 | HR 61 | Resp 16 | Ht 64.0 in | Wt 220.8 lb

## 2023-11-05 DIAGNOSIS — Z9889 Other specified postprocedural states: Secondary | ICD-10-CM | POA: Diagnosis not present

## 2023-11-05 DIAGNOSIS — I25118 Atherosclerotic heart disease of native coronary artery with other forms of angina pectoris: Secondary | ICD-10-CM | POA: Diagnosis not present

## 2023-11-05 DIAGNOSIS — Z006 Encounter for examination for normal comparison and control in clinical research program: Secondary | ICD-10-CM

## 2023-11-05 DIAGNOSIS — E785 Hyperlipidemia, unspecified: Secondary | ICD-10-CM

## 2023-11-05 DIAGNOSIS — I5032 Chronic diastolic (congestive) heart failure: Secondary | ICD-10-CM | POA: Diagnosis not present

## 2023-11-05 DIAGNOSIS — I10 Essential (primary) hypertension: Secondary | ICD-10-CM

## 2023-11-05 MED ORDER — ZOLPIDEM TARTRATE 10 MG PO TABS: 10.0000 mg | ORAL_TABLET | Freq: Every day | ORAL | 5 refills | Status: AC

## 2023-11-05 MED ORDER — STUDY - OCEAN(A) - OLPASIRAN (AMG 890) 142 MG/ML OR PLACEBO SQ INJECTION (PI-HILTY)
142.0000 mg | PREFILLED_SYRINGE | Freq: Once | SUBCUTANEOUS | Status: AC
  Administered 2023-11-05: 142 mg via SUBCUTANEOUS
  Filled 2023-11-05: qty 1

## 2023-11-05 NOTE — Research (Signed)
 [facility]  Week 84  Vitals: [x]  Experience any AE/SAE/Hospitalizations []  Yes [x]  No  If yes please explain:  Labs collected:  no labs  Discussed with patient about the importance of not letting any one draw cholesterol or lipids. As to we are blinded to results. Reassured patient if we needed to be contacted the study team would reach out to our unblinded person.   ~reminder fasting labs, EKG, MD to see next visit ~  Non-Fatal Potential Endpoint Assessment Yes  No   Has the subject experienced/undergone any of the following since the last visit/contact?   []   [x]    Any Coronary Artery Revascularization/Cerebrovascular Revascularization/ Peripheral Artery Revascularization/Amputation Procedure   []   [x]    Myocardial Infarction []   [x]    Stroke   []   [x]    Provide the date for the non-fatal Potential Endpoints status:   []   [x]     IP admin please see MAR (please add Box # to comment section on MAR) [x]    Current Outpatient Medications:    aspirin  81 MG tablet, Take 81 mg by mouth daily. , Disp: , Rfl:    carvedilol  (COREG ) 3.125 MG tablet, Take 1 tablet (3.125 mg total) by mouth 2 (two) times daily., Disp: 180 tablet, Rfl: 0   clopidogrel  (PLAVIX ) 75 MG tablet, TAKE 1 TABLET BY MOUTH EVERY DAY WITH BREAKFAST, Disp: 90 tablet, Rfl: 0   clotrimazole (LOTRIMIN) 1 % cream, Apply 1 Application topically 2 (two) times daily., Disp: , Rfl:    Continuous Glucose Sensor (FREESTYLE LIBRE 3 PLUS SENSOR) MISC, 1 Device by Does not apply route every 14 (fourteen) days. Change sensor every 15 days., Disp: , Rfl:    cyclobenzaprine (FLEXERIL) 10 MG tablet, Take 10 mg by mouth at bedtime., Disp: , Rfl:    ezetimibe  (ZETIA ) 10 MG tablet, TAKE 1 TABLET BY MOUTH EVERY DAY, Disp: 90 tablet, Rfl: 0   gabapentin  (NEURONTIN ) 300 MG capsule, Take 300 mg by mouth 3 (three) times daily. , Disp: , Rfl:    insulin  aspart (NOVOLOG ) 100 UNIT/ML injection, Inject 4-10 Units into the skin See admin instructions. Per  sliding scale Under 150 no units 150-199 take 8 units 200-249 take 10 units 250-299 take 12 units, Disp: , Rfl:    isosorbide  mononitrate (IMDUR ) 60 MG 24 hr tablet, TAKE 1 TABLET BY MOUTH EVERY DAY, Disp: 90 tablet, Rfl: 0   metFORMIN (GLUCOPHAGE-XR) 500 MG 24 hr tablet, Take 500 mg by mouth in the morning and at bedtime. , Disp: , Rfl:    MOUNJARO 7.5 MG/0.5ML Pen, Inject 7.5 mg into the skin once Park week., Disp: , Rfl:    nitroGLYCERIN  (NITROSTAT ) 0.4 MG SL tablet, Place 1 tablet (0.4 mg total) under the tongue every 5 (five) minutes as needed for chest pain., Disp: 25 tablet, Rfl: 3   oxyCODONE -acetaminophen  (PERCOCET) 10-325 MG per tablet, Take 1 tablet by mouth 4 (four) times daily., Disp: , Rfl:    pantoprazole  (PROTONIX ) 40 MG tablet, Take 1 tablet (40 mg total) by mouth daily., Disp: 30 tablet, Rfl: 11   rosuvastatin  (CRESTOR ) 40 MG tablet, TAKE 1 TABLET (40 MG TOTAL) BY MOUTH IN THE EVENING, Disp: 90 tablet, Rfl: 3   sertraline  (ZOLOFT ) 25 MG tablet, Take 25 mg by mouth daily., Disp: , Rfl:    TRESIBA  FLEXTOUCH 100 UNIT/ML SOPN FlexTouch Pen, Inject 70 Units as directed at bedtime., Disp: , Rfl: 4   Vitamin D, Ergocalciferol, (DRISDOL) 1.25 MG (50000 UNIT) CAPS capsule, Take  50,000 Units by mouth once Park week., Disp: , Rfl:    zolpidem  (AMBIEN ) 10 MG tablet, Take 1 tablet (10 mg total) by mouth at bedtime., Disp: 30 tablet, Rfl: 5  Current Facility-Administered Medications:    Study - [facility]) - olpasiran (AMG 890) 142 mg/mL or placebo SQ injection (PI-Hilty), 142 mg, Subcutaneous, Once,   Stopped premarin 10/31/2023 due to weight gain  No changes to neck and back pain

## 2023-11-05 NOTE — Patient Instructions (Signed)
 Medication Instructions:  No changes *If you need a refill on your cardiac medications before your next appointment, please call your pharmacy*  Lab Work: Today: first floor lab:  lipids, liver function   Testing/Procedures: none  Follow-Up: At [facility], you and your health needs are our priority.  As part of our continuing mission to provide you with exceptional heart care, our providers are all part of one team.  This team includes your primary Cardiologist (physician) and Advanced Practice Providers or APPs (Physician Assistants and Nurse Practitioners) who all work together to provide you with the care you need, when you need it.  Your next appointment:   12 month(s)  Provider:   Lonni Cash, MD     Other Instructions

## 2023-11-06 ENCOUNTER — Ambulatory Visit: Payer: Self-pay | Admitting: Cardiovascular Disease

## 2023-11-06 LAB — LIPID PANEL
Chol/HDL Ratio: 2.3 ratio (ref 0.0–4.4)
Cholesterol, Total: 94 mg/dL — ABNORMAL LOW (ref 100–199)
HDL: 41 mg/dL (ref >39)
LDL Chol Calc (NIH): 31 mg/dL (ref 0–99)
Triglycerides: 125 mg/dL (ref 0–149)
VLDL Cholesterol Cal: 22 mg/dL (ref 5–40)

## 2023-11-06 LAB — HEPATIC FUNCTION PANEL
ALT: 26 IU/L (ref 0–32)
AST: 29 IU/L (ref 0–40)
Albumin: 4.1 g/dL (ref 3.8–4.9)
Alkaline Phosphatase: 62 IU/L (ref 44–121)
Bilirubin Total: 0.4 mg/dL (ref 0.0–1.2)
Bilirubin, Direct: 0.18 mg/dL (ref 0.00–0.40)
Total Protein: 6.7 g/dL (ref 6.0–8.5)

## 2023-11-24 ENCOUNTER — Other Ambulatory Visit: Payer: Self-pay | Admitting: Cardiovascular Disease

## 2023-12-03 ENCOUNTER — Other Ambulatory Visit: Payer: Self-pay | Admitting: Cardiovascular Disease

## 2023-12-27 ENCOUNTER — Other Ambulatory Visit: Payer: Self-pay | Admitting: Cardiovascular Disease

## 2024-01-02 ENCOUNTER — Telehealth: Payer: Self-pay | Admitting: Cardiovascular Disease

## 2024-01-02 MED ORDER — ROSUVASTATIN CALCIUM 40 MG PO TABS: 40.0000 mg | ORAL_TABLET | Freq: Every evening | ORAL | 3 refills | Status: AC

## 2024-01-02 NOTE — Telephone Encounter (Signed)
*  STAT* If patient is at the pharmacy, call can be transferred to refill team.   1. Which medications need to be refilled? (please list name of each medication and dose if known) rosuvastatin  (CRESTOR ) 40 MG tablet    2. Would you like to learn more about the convenience, safety, & potential cost savings by using the [facility]? No   3. Are you open to using the [facility] (Type [facility].) No   4. Which pharmacy/location (including street and city if local pharmacy) is medication to be sent to? [id number] - [place], [place] - [address]    5. Do they need a 30 day or 90 day supply? 90 day  Pt is out of medication

## 2024-01-02 NOTE — Telephone Encounter (Signed)
 RX sent in

## 2024-01-10 ENCOUNTER — Encounter: Admitting: *Deleted

## 2024-01-10 VITALS — BP 102/69 | HR 65 | Temp 97.6°F | Resp 18 | Wt 209.0 lb

## 2024-01-10 DIAGNOSIS — Z006 Encounter for examination for normal comparison and control in clinical research program: Secondary | ICD-10-CM

## 2024-01-10 MED ORDER — STUDY - OCEAN(A) - OLPASIRAN (AMG 890) 142 MG/ML OR PLACEBO SQ INJECTION (PI-HILTY)
142.0000 mg | PREFILLED_SYRINGE | Freq: Once | SUBCUTANEOUS | Status: AC
  Administered 2024-01-10: 142 mg via SUBCUTANEOUS
  Filled 2024-01-10: qty 1

## 2024-01-10 NOTE — Research (Signed)
 [facility]  Week 96  Vitals: [x]  Experience any AE/SAE/Hospitalizations []  Yes [x]  No  If yes please explain:  EKG 0858 MD to see: TS Labs collected:  0907    Discussed with patient about the importance of not letting any one draw cholesterol or lipids. As to we are blinded to results. Reassured patient if we needed to be contacted the study team would reach out to our unblinded person.   No labs at next visit 108.    Non-Fatal Potential Endpoint Assessment Yes  No   Has the subject experienced/undergone any of the following since the last visit/contact?   []   [x]    Any Coronary Artery Revascularization/Cerebrovascular Revascularization/ Peripheral Artery Revascularization/Amputation Procedure   []   [x]    Myocardial Infarction []   [x]    Stroke   []   [x]    Provide the date for the non-fatal Potential Endpoints status as of today visit date    []   [x]     IP admin please see MAR (please add Box # to comment section on MAR) [x]    Current Outpatient Medications:    aspirin  81 MG tablet, Take 81 mg by mouth daily. , Disp: , Rfl:    carvedilol  (COREG ) 3.125 MG tablet, TAKE 1 TABLET BY MOUTH 2 TIMES DAILY., Disp: 180 tablet, Rfl: 3   clopidogrel  (PLAVIX ) 75 MG tablet, TAKE 1 TABLET BY MOUTH EVERY DAY WITH BREAKFAST, Disp: 90 tablet, Rfl: 3   clotrimazole (LOTRIMIN) 1 % cream, Apply 1 Application topically 2 (two) times daily., Disp: , Rfl:    Continuous Glucose Sensor (FREESTYLE LIBRE 3 PLUS SENSOR) MISC, 1 Device by Does not apply route every 14 (fourteen) days. Change sensor every 15 days., Disp: , Rfl:    cyclobenzaprine (FLEXERIL) 10 MG tablet, Take 10 mg by mouth at bedtime., Disp: , Rfl:    ezetimibe  (ZETIA ) 10 MG tablet, TAKE 1 TABLET BY MOUTH EVERY DAY, Disp: 90 tablet, Rfl: 3   gabapentin  (NEURONTIN ) 300 MG capsule, Take 300 mg by mouth 3 (three) times daily. , Disp: , Rfl:    insulin  aspart (NOVOLOG ) 100 UNIT/ML injection, Inject 4-10 Units into the skin See admin instructions.  Per sliding scale Under 150 no units 150-199 take 8 units 200-249 take 10 units 250-299 take 12 units, Disp: , Rfl:    isosorbide  mononitrate (IMDUR ) 60 MG 24 hr tablet, TAKE 1 TABLET BY MOUTH EVERY DAY, Disp: 90 tablet, Rfl: 0   metFORMIN (GLUCOPHAGE-XR) 500 MG 24 hr tablet, Take 500 mg by mouth in the morning and at bedtime. , Disp: , Rfl:    MOUNJARO 7.5 MG/0.5ML Pen, Inject 7.5 mg into the skin once Park week., Disp: , Rfl:    nitroGLYCERIN  (NITROSTAT ) 0.4 MG SL tablet, Place 1 tablet (0.4 mg total) under the tongue every 5 (five) minutes as needed for chest pain., Disp: 25 tablet, Rfl: 3   oxyCODONE -acetaminophen  (PERCOCET) 10-325 MG per tablet, Take 1 tablet by mouth 4 (four) times daily., Disp: , Rfl:    pantoprazole  (PROTONIX ) 40 MG tablet, Take 1 tablet (40 mg total) by mouth daily., Disp: 30 tablet, Rfl: 11   rosuvastatin  (CRESTOR ) 40 MG tablet, Take 1 tablet (40 mg total) by mouth every evening., Disp: 90 tablet, Rfl: 3   sertraline  (ZOLOFT ) 50 MG tablet, Take 50 mg by mouth daily., Disp: , Rfl:    TRESIBA  FLEXTOUCH 100 UNIT/ML SOPN FlexTouch Pen, Inject 70 Units as directed at bedtime., Disp: , Rfl: 4   Vitamin D, Ergocalciferol, (DRISDOL) 1.25  MG (50000 UNIT) CAPS capsule, Take 50,000 Units by mouth once Park week., Disp: , Rfl:    zolpidem  (AMBIEN ) 10 MG tablet, Take 1 tablet (10 mg total) by mouth at bedtime., Disp: 30 tablet, Rfl: 5   amoxicillin  (AMOXIL ) 500 MG tablet, Take 500 mg by mouth as directed. Take four tablets by mouth one hour before any dental visits including cleanings, Disp: , Rfl:   Current Facility-Administered Medications:    Study - [facility]) - olpasiran (AMG 890) 142 mg/mL or placebo SQ injection (PI-Hilty), 142 mg, Subcutaneous, Once,

## 2024-01-11 ENCOUNTER — Other Ambulatory Visit: Payer: Self-pay | Admitting: Cardiovascular Disease

## 2024-01-14 NOTE — Research (Addendum)
 Are there any labs that are clinically significant?  Yes []  OR No[x]   Please FORWARD back to me with any changes or follow up!   Elizabeth KYM Maxcy, MD, [facility], FNLA, FACP  [place]  [facility]  Medical Director of the [facility]  Card[facility] Reduction [facility] Diplomate of the American Board of Clinical Lipidology Attending Cardiologist  Direct Dial: [phone]  Fax: [phone]  Website:  [email or web address]  ACCESSION NO. [id number]                                             Page 1 of 1                                                        INVESTIGATOR: ([id number])                          PROTOCOL   [id number]                     Elizabeth Park M.D.                             INVESTIGATOR NO.: [id number]                     c/o Reena Lies                              SUBJECT ID: [id number]                     [facility]                 SUBJECT INITIALS NOT COLLECTED:                     [address] [place] [address]                 VISIT: [name], [place]  [id number]                   SPONSOR REPORT TO:                 COLLECTION TIME:09:07 DATE:10-Jan-2024                     Leora Shoe                      DATE RECEIVED IN LABORATORY: 11-Jan-2024                     c/o Sponsor(or Addtl) [employer].Study DATE REPORTED BY LABORATORY: 12-Jan-2024                     [facility]                          SEX: F  BIRTHDATE:  01-Oct-1970    AGE: 53y                     [address]                  SELMA NO.: [phone]                   [place], [place]  [phone]                                                                                        Ref. Ranges               Clinical    Comments                                                                          Significance                                                                            Yes*  No                    SM [id number]  ANTIBODY COLL D/T                      CDate PreD     10-Jan-2024                                               CTime PreD     09:07                                                   SM [id number] LPA COLLECTION D/T                      Coll Date      10-Jan-2024                                               Coll Time      09:07  CHEMISTRY PANEL                      Total Bili     0.4          0.2-1.2 mg/dL                                D-BilGen2      0.16         0.00-0.36 mg/dL                              Ind Bili       0.2          0.0-1.2 mg/dL                                Alk Phos       70           35-104 U/L                                   ALT (SGPT)     36           4-43 U/L                                    AST (SGOT)     26           8-40 U/L                                    Urea Nitr      24           4-24 mg/dL                                   Creatinine     1.09         0.35-1.14 mg/dL                              Calcium         9.4          8.3-10.6 mg/dL                              Total Prot     7.0          6.1-8.4 g/dL                                 Alb BCG        4.5          3.3-4.9 g/dL                                 CK             148  26-192 U/L                                   Sodium         141          132-147 mEq/L                                Potassium      3.9          3.5-5.2 mEq/L                                Bicarb         21.8         19.3-29.3 mEq/L                              Chloride       102          94-112 mEq/L                               ADJUSTED CALCIUM                       Adj Calc       9.4          8.3-10.6 mg/dL                       HEMATOLOGY&DIFFERENTIAL PANEL                      HGB            13.4         11.6-16.4 g/dL                              HCT            40           34-48 %                                      RBC            4.5          4.1-5.6 x106/uL                               MCH            30           26-34 pg                                    MCHC           33           31-38 g/dL  RDW            13.4         12.0-15.0 %                                 RBC Morph      No Review Required                                       MCV            91           79-98 fL                                    WBC            6.01         3.80-10.70 x103/uL                           Neutrophil     3.47         1.96-7.23 x103/uL                           Lymphocyte     2.03         0.91-4.28 x103/uL                           Monocytes      0.34         0.12-0.92 x103/uL                           Eosinophil     0.11         0.00-0.57 x103/uL                           Basophils      0.06         0.00-0.20 x103/uL                           Neutrophil     57.8         40.5-75.0 %                                 Lymphocyte     33.8         15.4-48.5%                                   Monocytes      5.6          2.6-10.1 %                                   Eosinophil     1.8          0.0-6.8 %  Basophils      1.0          0.0-2.0 %                                    Platelets      307          140-400 x103/uL                            ANC                      ANC            3.47         1.96-7.23 x103/uL                      HBA1C                      Hgb A1c        7.6     H    <6.5%      08/08/2023 - 7.7                    EGFR                      CKDEPI eGF     60           mL/min/1.73m2                                                            No Ref Rng                   ANION GAP                      Anion Gap      21      H    7-18 mEq/L       08/08/2023 - 21                       FASTING GLUCOSE                      Glucose        157     H    70-100 mg/dL     88/86/7975 - 849                                                  IS SUBJECT FASTING?                      Fasting?       Yes

## 2024-01-16 NOTE — Progress Notes (Signed)
 Elizabeth Park is in for a follow up visit.  She is doing very well.  No seeming problems with IP.  Denies chest pain.  She quit smoking in 2010.  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 Dusty.  She has had interim PCI and currently stable.  She is followed by Dr. Verlin and had elevated Lpa>400.  Currently in [name].    01/10/2024    8:49 AM    BP 102/69  BP Location Right Arm  Patient Position Sitting  Cuff Size Normal  Pulse Rate 65  Temp 97.6 F (36.4 C)  Weight 209 lb (94.8 kg)  Resp 18  SpO2 100 %   Alert, oriented female in NAD Lungs clear Cor Regular Soft SEM Abd soft Ext no edema   ECG  NSR.  LAD.  Except for minor T flattening, no change from tracing of 02/14/23  Impression   Extensive CAD SP CABG and PCI  Elevated Lp(a) with early onset CAD  Hyperlipidemia on maximally tolerated statin and ezetamibe  Diabetes mellitus  Continue follow up in [name]   Arafat Cocuzza D.Morris, MD, [facility] Medical Director, [facility]

## 2024-01-17 NOTE — Research (Addendum)
 Are there any labs that are clinically significant?  Yes []  OR No[x]   Please FORWARD back to me with any changes or follow up!    Vinie KYM Maxcy, MD, [facility], FNLA, FACP  [place]  [facility]  Medical Director of the Advanced Lipid Disorders &  Cardiovascular Risk Reduction Clinic Diplomate of the American Board of Clinical Lipidology Attending Cardiologist  Direct Dial: [phone]  Fax: [phone]  Website:  [email or web address]                        ALT > 3 X ULN                      ALT>3XULN      Criteria not met                                        ALT > 5 X ULN                      ALT>5XULN      Criteria not met                                        ALT > 8 X ULN                      ALT>8XULN      Criteria not met                                        ALT & TBIL                      ALT & TBIL     Criteria not met                                        AST > 3 X ULN                      AST>3XULN      Criteria not met                                        AST > 5 X ULN                      AST>5XULN      Criteria not met                                        AST > 8 X ULN                      AST>8XULN      Criteria not met  AST & TBIL                      AST & TBIL     Criteria not met                     [employer]                      APTT           22.8         21.9-29.4 sec                                PT             10.6         9.7-12.3 sec                                 INR            1.0          Patient not taking                                                       oral anticoagulant:                                                      0.8 - 1.2                                                                Patient taking                                                          oral anticoagulant:                                                      2.0 - 3.0                                   ALT & INR                      ALT & INR      To follow  AST & INR                      AST & INR      To follow

## 2024-04-07 ENCOUNTER — Other Ambulatory Visit: Payer: Self-pay

## 2024-04-07 ENCOUNTER — Encounter: Admitting: *Deleted

## 2024-04-07 VITALS — BP 136/83 | HR 66 | Temp 98.2°F | Resp 16

## 2024-04-07 DIAGNOSIS — Z006 Encounter for examination for normal comparison and control in clinical research program: Secondary | ICD-10-CM

## 2024-04-07 MED ORDER — STUDY - OCEAN(A) - OLPASIRAN (AMG 890) 142 MG/ML OR PLACEBO SQ INJECTION (PI-HILTY)
142.0000 mg | PREFILLED_SYRINGE | Freq: Once | SUBCUTANEOUS | Status: AC
  Administered 2024-04-07: 142 mg via SUBCUTANEOUS
  Filled 2024-04-07: qty 1

## 2024-04-07 NOTE — Research (Signed)
 [name]  Vitals: [x]  Experience any AE/SAE/Hospitalizations []  Yes [x]  No    Discussed with patient about the importance of not letting any one draw cholesterol or lipids. As to we are blinded to results. Reassured patient if we needed to be contacted the study team would reach out to our unblinded person.     Non-Fatal Potential Endpoint Assessment Yes  No   Has the subject experienced/undergone any of the following since the last visit/contact?   []   [x]    Any Coronary Artery Revascularization/Cerebrovascular Revascularization/ Peripheral Artery Revascularization/Amputation Procedure   []   [x]    Myocardial Infarction []   [x]    Stroke   []   [x]    Provide the date for the non-fatal Potential Endpoints status as of today visit date    []   [x]     IP admin please see MAR (please add Box # to comment section on MAR) [x]   Elizabeth Park is here for Week 108 of [name]. She reports no visits to the ed or urgent care, no changes in her meds, and no abd pain. Scheduled next visit for April 13 at 1100. Tol injection well.

## 2024-07-14 ENCOUNTER — Encounter
